# Patient Record
Sex: Female | Born: 1940 | Race: White | Hispanic: No | Marital: Married | Smoking: Never smoker
Health system: Southern US, Community
[De-identification: ages and names within clinical notes are randomized; demographics above are authoritative.]

## PROBLEM LIST (undated history)

## (undated) DIAGNOSIS — Z9289 Personal history of other medical treatment: Secondary | ICD-10-CM

## (undated) DIAGNOSIS — K219 Gastro-esophageal reflux disease without esophagitis: Secondary | ICD-10-CM

## (undated) DIAGNOSIS — M1990 Unspecified osteoarthritis, unspecified site: Secondary | ICD-10-CM

## (undated) DIAGNOSIS — I10 Essential (primary) hypertension: Secondary | ICD-10-CM

## (undated) DIAGNOSIS — I779 Disorder of arteries and arterioles, unspecified: Secondary | ICD-10-CM

## (undated) DIAGNOSIS — F419 Anxiety disorder, unspecified: Secondary | ICD-10-CM

## (undated) DIAGNOSIS — B009 Herpesviral infection, unspecified: Secondary | ICD-10-CM

## (undated) DIAGNOSIS — E039 Hypothyroidism, unspecified: Secondary | ICD-10-CM

## (undated) DIAGNOSIS — D649 Anemia, unspecified: Secondary | ICD-10-CM

## (undated) DIAGNOSIS — E785 Hyperlipidemia, unspecified: Secondary | ICD-10-CM

## (undated) DIAGNOSIS — R7303 Prediabetes: Secondary | ICD-10-CM

## (undated) DIAGNOSIS — I739 Peripheral vascular disease, unspecified: Secondary | ICD-10-CM

## (undated) DIAGNOSIS — C4490 Unspecified malignant neoplasm of skin, unspecified: Secondary | ICD-10-CM

## (undated) DIAGNOSIS — J45909 Unspecified asthma, uncomplicated: Secondary | ICD-10-CM

## (undated) HISTORY — PX: TONSILLECTOMY: SUR1361

## (undated) HISTORY — DX: Hyperlipidemia, unspecified: E78.5

## (undated) HISTORY — DX: Unspecified osteoarthritis, unspecified site: M19.90

## (undated) HISTORY — DX: Essential (primary) hypertension: I10

## (undated) HISTORY — DX: Herpesviral infection, unspecified: B00.9

## (undated) HISTORY — DX: Anxiety disorder, unspecified: F41.9

## (undated) HISTORY — PX: JOINT REPLACEMENT: SHX530

## (undated) HISTORY — DX: Unspecified malignant neoplasm of skin, unspecified: C44.90

## (undated) HISTORY — PX: DILATION AND CURETTAGE OF UTERUS: SHX78

## (undated) HISTORY — PX: EYE SURGERY: SHX253

## (undated) HISTORY — PX: OTHER SURGICAL HISTORY: SHX169

## (undated) HISTORY — DX: Gastro-esophageal reflux disease without esophagitis: K21.9

---

## 1970-10-22 HISTORY — PX: ABDOMINAL EXPLORATION SURGERY: SHX538

## 1991-10-23 HISTORY — PX: ABDOMINAL HYSTERECTOMY: SHX81

## 2008-03-12 ENCOUNTER — Ambulatory Visit: Payer: Self-pay | Admitting: Internal Medicine

## 2009-10-22 DIAGNOSIS — F419 Anxiety disorder, unspecified: Secondary | ICD-10-CM

## 2009-10-22 HISTORY — DX: Anxiety disorder, unspecified: F41.9

## 2010-01-09 ENCOUNTER — Ambulatory Visit: Payer: Self-pay

## 2010-02-17 ENCOUNTER — Ambulatory Visit: Payer: Self-pay | Admitting: General Practice

## 2010-02-20 DIAGNOSIS — B009 Herpesviral infection, unspecified: Secondary | ICD-10-CM

## 2010-02-20 HISTORY — DX: Herpesviral infection, unspecified: B00.9

## 2010-03-03 ENCOUNTER — Ambulatory Visit: Payer: Self-pay | Admitting: General Practice

## 2010-07-05 ENCOUNTER — Ambulatory Visit: Payer: Self-pay | Admitting: Internal Medicine

## 2011-02-20 HISTORY — PX: OTHER SURGICAL HISTORY: SHX169

## 2011-03-21 LAB — HM DEXA SCAN: HM Dexa Scan: NORMAL

## 2011-06-27 ENCOUNTER — Other Ambulatory Visit: Payer: Self-pay | Admitting: Internal Medicine

## 2011-06-27 MED ORDER — ACYCLOVIR 400 MG PO TABS: 400.0000 mg | ORAL_TABLET | Freq: Every day | ORAL | Status: AC

## 2011-07-12 ENCOUNTER — Ambulatory Visit (INDEPENDENT_AMBULATORY_CARE_PROVIDER_SITE_OTHER): Payer: BC Managed Care – PPO | Admitting: Internal Medicine

## 2011-07-12 ENCOUNTER — Encounter: Payer: Self-pay | Admitting: Internal Medicine

## 2011-07-12 VITALS — BP 154/81 | HR 76 | Temp 98.2°F | Resp 16 | Ht 64.0 in | Wt 154.5 lb

## 2011-07-12 DIAGNOSIS — Z23 Encounter for immunization: Secondary | ICD-10-CM

## 2011-07-12 DIAGNOSIS — E785 Hyperlipidemia, unspecified: Secondary | ICD-10-CM

## 2011-07-12 DIAGNOSIS — E039 Hypothyroidism, unspecified: Secondary | ICD-10-CM

## 2011-07-12 DIAGNOSIS — I10 Essential (primary) hypertension: Secondary | ICD-10-CM

## 2011-07-12 DIAGNOSIS — Z0000 Encounter for general adult medical examination without abnormal findings: Secondary | ICD-10-CM

## 2011-07-12 LAB — CBC WITH DIFFERENTIAL/PLATELET
Basophils Absolute: 0 10*3/uL (ref 0.0–0.1)
Basophils Relative: 0.8 % (ref 0.0–3.0)
Eosinophils Absolute: 0.1 10*3/uL (ref 0.0–0.7)
Lymphocytes Relative: 30.9 % (ref 12.0–46.0)
MCHC: 32.3 g/dL (ref 30.0–36.0)
MCV: 90.7 fl (ref 78.0–100.0)
Monocytes Absolute: 0.3 10*3/uL (ref 0.1–1.0)
Neutrophils Relative %: 60.1 % (ref 43.0–77.0)
Platelets: 222 10*3/uL (ref 150.0–400.0)
RDW: 13.8 % (ref 11.5–14.6)

## 2011-07-12 LAB — COMPREHENSIVE METABOLIC PANEL
ALT: 46 U/L — ABNORMAL HIGH (ref 0–35)
AST: 44 U/L — ABNORMAL HIGH (ref 0–37)
Albumin: 4.8 g/dL (ref 3.5–5.2)
Alkaline Phosphatase: 83 U/L (ref 39–117)
BUN: 18 mg/dL (ref 6–23)
Potassium: 3.9 mEq/L (ref 3.5–5.1)
Sodium: 140 mEq/L (ref 135–145)

## 2011-07-12 LAB — LIPID PANEL
LDL Cholesterol: 106 mg/dL — ABNORMAL HIGH (ref 0–99)
Total CHOL/HDL Ratio: 3
Triglycerides: 127 mg/dL (ref 0.0–149.0)

## 2011-07-12 LAB — MICROALBUMIN / CREATININE URINE RATIO: Creatinine,U: 43.5 mg/dL

## 2011-07-12 LAB — HM MAMMOGRAPHY: HM Mammogram: NORMAL

## 2011-07-12 LAB — TSH: TSH: 0.68 u[IU]/mL (ref 0.35–5.50)

## 2011-07-12 NOTE — Progress Notes (Signed)
Subjective:    Mary Pacheco is a 70 y.o. female who presents for Medicare Annual/Subsequent preventive examination.  Preventive Screening-Counseling & Management  Tobacco History  Smoking status  . Never Smoker   Smokeless tobacco  . Never Used     Problems Prior to Visit 1. Hyperlipidemia 2. Hypothyroidism 3. Anxiety  4. Hypertension   Current Problems (verified) Patient Active Problem List  Diagnoses  . Hyperlipidemia    Medications Prior to Visit No current outpatient prescriptions on file prior to visit.    Current Medications (verified) Current Outpatient Prescriptions  Medication Sig Dispense Refill  . acyclovir (ZOVIRAX) 400 MG tablet Take 400 mg by mouth daily.       [name] ALPRAZolam (XANAX) 0.25 MG tablet Take 0.25 mg by mouth at bedtime as needed.        [name] aspirin 81 MG tablet Take 81 mg by mouth daily.        . cholecalciferol (VITAMIN D) 1000 UNITS tablet Take 1,000 Units by mouth daily.        . hydrochlorothiazide (MICROZIDE) 12.5 MG capsule Take 12.5 mg by mouth daily.       . Misc Natural Products (TOTAL CARDIO HEALTH FORMULA PO) Take 1 tablet by mouth daily.        . montelukast (SINGULAIR) 10 MG tablet Take 10 mg by mouth at bedtime.        . Multiple Vitamin (MULTIVITAMIN) capsule Take 1 capsule by mouth daily.        [name] NEXIUM 40 MG capsule Take 40 mg by mouth daily.       . simvastatin (ZOCOR) 20 MG tablet Take 20 mg by mouth daily.       [name] SYNTHROID 50 MCG tablet Take 50 mcg by mouth daily.       [name] terbinafine (LAMISIL) 250 MG tablet Take 250 mg by mouth daily.        [name] triamcinolone (KENALOG) 0.1 % cream Apply 1 application topically as needed.           Allergies (verified) Molds & smuts   PAST HISTORY  Family History No family history on file.  Social History History  Substance Use Topics  . Smoking status: Never Smoker   . Smokeless tobacco: Never Used  . Alcohol Use: No     socially     Are there smokers in your home (other  than you)? No  Risk Factors Current exercise habits: Gym/ health club routine includes cardio.  Dietary issues discussed: low fat, high fiber.   Cardiac risk factors: family history of premature cardiovascular disease and hypertension.  Depression Screen (Note: if answer to either of the following is "Yes", a more complete depression screening is indicated)   Over the past two weeks, have you felt down, depressed or hopeless? No  Over the past two weeks, have you felt little interest or pleasure in doing things? No  Have you lost interest or pleasure in daily life? No  Do you often feel hopeless? No  Do you cry easily over simple problems? No  Activities of Daily Living In your present state of health, do you have any difficulty performing the following activities?:  Driving? No Managing money?  No Feeding yourself? No Getting from bed to chair? No Climbing a flight of stairs? No Preparing food and eating?: No Bathing or showering? No Getting dressed: No Getting to the toilet? No Using the toilet:No Moving around from place to place: No In the past  year have you fallen or had a near fall?:No   Are you sexually active?  Yes  Do you have more than one partner?  No  Hearing Difficulties: Yes Do you often ask people to speak up or repeat themselves? No Do you experience ringing or noises in your ears? No Do you have difficulty understanding soft or whispered voices? Yes   Do you feel that you have a problem with memory? No  Do you often misplace items? Yes  Do you feel safe at home?  Yes  Cognitive Testing  Alert? Yes  Normal Appearance?Yes  Oriented to person? Yes  Place? Yes   Time? Yes  Recall of three objects?  Yes  Can perform simple calculations? Yes  Displays appropriate judgment?Yes  Can read the correct time from a watch face?Yes   Advanced Directives have been discussed with the patient? Yes, HCPOA husband  List the Names of Other Physician/Practitioners  you currently use: 1.  Dr. Ernest Pine  Indicate any recent Medical Services you may have received from other than [facility] providers in the past year (date may be approximate).   There is no immunization history on file for this patient.  Screening Tests Health Maintenance  Topic Date Due  . Tetanus/tdap  07/28/1959  . Colonoscopy  07/27/1990  . Zostavax  07/27/2000  . Pneumococcal Polysaccharide Vaccine Age 11 And Over  07/27/2005  . Influenza Vaccine  07/23/2011    All answers were reviewed with the patient and necessary referrals were made:  Shelia Media, MD   07/12/2011   History reviewed: allergies, current medications, past family history, past medical history, past social history, past surgical history and problem list  Review of Systems A comprehensive review of systems was negative.    Objective:       Body mass index is 26.52 kg/(m^2). BP 154/81  Pulse 76  Temp(Src) 98.2 F (36.8 C) (Oral)  Resp 16  Ht 5\' 4"  (1.626 m)  Wt 154 lb 8 oz (70.081 kg)  BMI 26.52 kg/m2  SpO2 98%  BP 154/81  Pulse 76  Temp(Src) 98.2 F (36.8 C) (Oral)  Resp 16  Ht 5\' 4"  (1.626 m)  Wt 154 lb 8 oz (70.081 kg)  BMI 26.52 kg/m2  SpO2 98%  General Appearance:    Alert, cooperative, no distress, appears stated age  Head:    Normocephalic, without obvious abnormality, atraumatic  Eyes:    PERRL, conjunctiva/corneas clear, EOM's intact, fundi    benign, both eyes  Ears:    Normal TM's and external ear canals, both ears  Nose:   Nares normal, septum midline, mucosa normal, no drainage    or sinus tenderness  Throat:   Lips, mucosa, and tongue normal; teeth and gums normal  Neck:   Supple, symmetrical, trachea midline, no adenopathy;    thyroid:  no enlargement/tenderness/nodules; no carotid   bruit or JVD  Back:     Symmetric, no curvature, ROM normal, no CVA tenderness  Lungs:     Clear to auscultation bilaterally, respirations unlabored  Chest Wall:    No tenderness or deformity    Heart:    Regular rate and rhythm, S1 and S2 normal, no murmur, rub   or gallop  Breast Exam:    No tenderness, masses, or nipple abnormality  Abdomen:     Soft, non-tender, bowel sounds active all four quadrants,    no masses, no organomegaly  Genitalia:    Normal female without lesion, discharge or tenderness, uterus  surgically absent.  Rectal:    Normal tone, no masses or tenderness;   guaiac negative stool  Extremities:   Extremities normal, atraumatic, no cyanosis or edema  Pulses:   2+ and symmetric all extremities  Skin:   Skin color, texture, turgor normal, no rashes or lesions  Lymph nodes:   Cervical, supraclavicular, and axillary nodes normal  Neurologic:   CNII-XII intact, normal strength, sensation and reflexes    throughout       Assessment:   1. General exam - patient presents for annual exam. She is doing very well. She eats a healthy well rounded diet and is exercising at a local gym on a regular basis. She had general physical exam with Pap smear performed today. Mammogram is scheduled for this afternoon. Colonoscopy is up to date. We will ask for records on bone density scan. Immunizations are up to date with the exception of influenza which was given today. Basic lab work was performed today including CBC, CMP, lipid profile, TSH, urine microalbumin.  2. HTN - blood pressure has been well controlled. Renal function was checked with labs today. Patient will continue current medications.  3. Hypothyroidism - will check TSH with labs today. Will continue Synthroid.  4. Anxiety - well-controlled on current medications. Patient will followup in 6 months.     Plan:     During the course of the visit the patient was educated and counseled about appropriate screening and preventive services including:    Pneumococcal vaccine   Influenza vaccine  Screening mammography  Screening Pap smear and pelvic exam   Bone densitometry screening  Colorectal cancer  screening  Diabetes screening  Advanced directives: power of attorney for healthcare on file, has an advanced directive - a copy HAS NOT been provided.  Diet review for nutrition referral? Not Indicated   Patient Instructions (the written plan) was given to the patient.  Medicare Attestation I have personally reviewed: The patient's medical and social history Their use of alcohol, tobacco or illicit drugs Their current medications and supplements The patient's functional ability including ADLs,fall risks, home safety risks, cognitive, and hearing and visual impairment Diet and physical activities Evidence for depression or mood disorders  The patient's weight, height, BMI, and visual acuity have been recorded in the chart.  I have made referrals, counseling, and provided education to the patient based on review of the above and I have provided the patient with a written personalized care plan for preventive services.     Shelia Media, MD   07/12/2011

## 2011-07-13 ENCOUNTER — Other Ambulatory Visit: Payer: Self-pay | Admitting: Internal Medicine

## 2011-07-13 ENCOUNTER — Other Ambulatory Visit (HOSPITAL_COMMUNITY)
Admission: RE | Admit: 2011-07-13 | Discharge: 2011-07-13 | Disposition: A | Discharge status: Home / Self Care | Payer: MEDICARE | Source: Ambulatory Visit | Attending: Internal Medicine | Admitting: Internal Medicine

## 2011-07-13 DIAGNOSIS — Z01419 Encounter for gynecological examination (general) (routine) without abnormal findings: Secondary | ICD-10-CM | POA: Insufficient documentation

## 2011-07-13 DIAGNOSIS — Z0000 Encounter for general adult medical examination without abnormal findings: Secondary | ICD-10-CM

## 2011-07-13 DIAGNOSIS — Z1159 Encounter for screening for other viral diseases: Secondary | ICD-10-CM | POA: Insufficient documentation

## 2011-07-16 ENCOUNTER — Telehealth: Payer: Self-pay | Admitting: *Deleted

## 2011-07-16 DIAGNOSIS — R7989 Other specified abnormal findings of blood chemistry: Secondary | ICD-10-CM

## 2011-07-16 NOTE — Telephone Encounter (Signed)
Message copied by Jobie Quaker on Mon Jul 16, 2011  2:55 PM ------      Message from: Ronna Polio A      Created: Fri Jul 13, 2011  1:48 PM      Regarding: mammo       Mammogram was normal.

## 2011-07-16 NOTE — Telephone Encounter (Signed)
Labs looked fine except that liver function tests were just on the upper limit of normal. I would recommend repeating LFTs in 1 month.  We can mail a copy of her labs. I don't know why they weren't routed to me for review?

## 2011-07-16 NOTE — Telephone Encounter (Signed)
Patient notified. Also she is asking for results on her labs. It doesn't look like they were routed to you to reviewe them. Please advise.

## 2011-07-17 NOTE — Telephone Encounter (Signed)
Patient notified. Lab appt scheduled. Future order placed for LFT's.

## 2011-07-18 ENCOUNTER — Encounter: Payer: Self-pay | Admitting: Internal Medicine

## 2011-07-27 ENCOUNTER — Other Ambulatory Visit: Payer: Self-pay | Admitting: Internal Medicine

## 2011-07-29 MED ORDER — ESOMEPRAZOLE MAGNESIUM 40 MG PO CPDR: 40.0000 mg | DELAYED_RELEASE_CAPSULE | Freq: Every day | ORAL | Status: DC

## 2011-08-01 ENCOUNTER — Other Ambulatory Visit: Payer: Self-pay | Admitting: Internal Medicine

## 2011-08-06 NOTE — Telephone Encounter (Signed)
Opened in error

## 2011-08-13 ENCOUNTER — Other Ambulatory Visit (INDEPENDENT_AMBULATORY_CARE_PROVIDER_SITE_OTHER): Payer: BC Managed Care – PPO | Admitting: *Deleted

## 2011-08-13 ENCOUNTER — Encounter: Payer: Self-pay | Admitting: Internal Medicine

## 2011-08-13 DIAGNOSIS — R7989 Other specified abnormal findings of blood chemistry: Secondary | ICD-10-CM

## 2011-08-14 LAB — HEPATIC FUNCTION PANEL
ALT: 33 U/L (ref 0–35)
AST: 31 U/L (ref 0–37)
Albumin: 4.5 g/dL (ref 3.5–5.2)
Alkaline Phosphatase: 87 U/L (ref 39–117)

## 2011-08-30 ENCOUNTER — Encounter: Payer: Self-pay | Admitting: Internal Medicine

## 2011-12-28 ENCOUNTER — Emergency Department: Payer: Self-pay | Admitting: Emergency Medicine

## 2011-12-28 ENCOUNTER — Telehealth: Payer: Self-pay | Admitting: Internal Medicine

## 2011-12-28 LAB — COMPREHENSIVE METABOLIC PANEL
Albumin: 4.5 g/dL (ref 3.4–5.0)
Anion Gap: 9 (ref 7–16)
BUN: 13 mg/dL (ref 7–18)
Bilirubin,Total: 0.5 mg/dL (ref 0.2–1.0)
Chloride: 104 mmol/L (ref 98–107)
Creatinine: 0.84 mg/dL (ref 0.60–1.30)
EGFR (African American): >60
EGFR (Non-African Amer.): >60
Glucose: 98 mg/dL (ref 65–99)
Potassium: 3.2 mmol/L — ABNORMAL LOW (ref 3.5–5.1)
SGPT (ALT): 80 U/L — ABNORMAL HIGH
Total Protein: 7.9 g/dL (ref 6.4–8.2)

## 2011-12-28 LAB — CBC
HCT: 41.9 % (ref 35.0–47.0)
MCH: 29.6 pg (ref 26.0–34.0)
MCV: 89 fL (ref 80–100)
RBC: 4.72 10*6/uL (ref 3.80–5.20)
RDW: 13.3 % (ref 11.5–14.5)

## 2011-12-28 LAB — LIPASE, BLOOD: Lipase: 250 U/L (ref 73–393)

## 2011-12-28 NOTE — Telephone Encounter (Signed)
Patient called in and said that she has had diarrhea for a couple of days and this morning when she went it was black, it was like pureed black beans, she hasn't eaten much since Monday.  Patient would like to be seen today Please advise.

## 2011-12-28 NOTE — Telephone Encounter (Signed)
Not seeing this until now, Sorry, I left pt detailed VM to call office ASAP and speak with triage nurses that are avail 24/7

## 2011-12-31 NOTE — Telephone Encounter (Signed)
Can we please request notes from the ED? I want to be sure they checked blood counts and stool for blood.

## 2011-12-31 NOTE — Telephone Encounter (Signed)
[facility] Triage Call Report Triage Record Num: [id number] Operator: Amy Head Patient Name: Mary Pacheco Call Date & Time: 12/28/2011 4:58:50PM Patient Phone: [phone] PCP: Ronna Polio Patient Gender: Female PCP Fax : [phone] Patient DOB: 04/06/1941 Practice Name: [facility] Reason for Call: Caller: Alcie/Patient; PCP: Ronna Polio; CB#: [phone]; ; ; Call regarding Diarrhea. Onset 2 days ago. This morning, noticed that her stool is "black as coal and is water." Afebrile. Takes Nexium. Has had black watery stools X 4 today. Feels like heart is beating fast. Has BP cuff. Advised to take BP: 172/100, HR 78. Weak. When asked if there was any chest pain or discomfort, pt states "I just feel uncomfortable." Advised 911 per guidelines. THE PATIENT REFUSED 911; Protocol(s) Used: Diarrhea or Other Change in Bowel Habits Protocol(s) Used: Gastrointestinal Bleeding Recommended Outcome per Protocol: Activate EMS 911 Reason for Outcome: Rectal bleeding (blood in or on the stool, more than scant; black tarry stools) Pressure, fullness, squeezing sensation or pain anywhere in the chest lasting 5 or more minutes now or within the last hour. Pain is NOT associated with taking a deep breath or a productive cough, movement, or touch to a localized area on the chest. Care Advice: ~ An adult should stay with the patient, preferably one trained in CPR. ~ IMMEDIATE ACTION Write down provider's name. List or place the following in a bag for transport with the patient: current prescription and/or nonprescription medications; alternative treatments, therapies and medications; and street drugs. ~ ~ Place person in a position of comfort and loosen tight clothing. After calling EMS 911, have the person chew one aspirin tablet (325 mg), or 4 baby aspirin (81mg ) with a small amount of water now if conscious, not allergic to aspirin, or if has not been told to avoid  taking aspirin by their provider. It is important to use aspirin, not acetaminophen. ~ Take nitroglycerin as directed if prescribed by your provider. Men should not take nitroglycerin within 36 hours of taking erectile dysfunction drugs unless directed to do so by their provider as it may cause a sudden drop in blood pressure. ~ ~ Tell providers if you are taking an erectile dysfunction drug such as Viagra(R), Cialis(R), Levitra(R). 12/28/2011 5:11:36PM Page 1 of 1 [id number]

## 2011-12-31 NOTE — Telephone Encounter (Signed)
Patient did go to the ER her potassium was a little low and was not dehydrated.  They did take 4 tubes of blood.  She states that she feels bloated and she isn't eating a lot of solid food she eats a lot of fruit, but what she would consider a solid standard meal she isn't up to that.  She states that she had taken one dose of pepto bismol and that can change your poop black per ER.  She does have an appointment with Dr. Dan Humphreys on the 20th.

## 2011-12-31 NOTE — Telephone Encounter (Signed)
Was patient seen?  Can you please call to make sure that pt was seen? Description sounds like upper GI bleeding, which needs emergent eval.

## 2012-01-03 NOTE — Telephone Encounter (Signed)
I have faxed Leonette Most at [facility] medical records to get the ED report.

## 2012-01-09 ENCOUNTER — Ambulatory Visit (INDEPENDENT_AMBULATORY_CARE_PROVIDER_SITE_OTHER): Payer: Self-pay | Admitting: Internal Medicine

## 2012-01-09 ENCOUNTER — Encounter: Payer: Self-pay | Admitting: Internal Medicine

## 2012-01-09 VITALS — BP 142/70 | HR 75 | Temp 97.8°F | Ht 64.0 in | Wt 153.0 lb

## 2012-01-09 DIAGNOSIS — M810 Age-related osteoporosis without current pathological fracture: Secondary | ICD-10-CM

## 2012-01-09 DIAGNOSIS — K219 Gastro-esophageal reflux disease without esophagitis: Secondary | ICD-10-CM | POA: Insufficient documentation

## 2012-01-09 DIAGNOSIS — E785 Hyperlipidemia, unspecified: Secondary | ICD-10-CM

## 2012-01-09 DIAGNOSIS — M129 Arthropathy, unspecified: Secondary | ICD-10-CM

## 2012-01-09 DIAGNOSIS — M1990 Unspecified osteoarthritis, unspecified site: Secondary | ICD-10-CM

## 2012-01-09 DIAGNOSIS — M8580 Other specified disorders of bone density and structure, unspecified site: Secondary | ICD-10-CM | POA: Insufficient documentation

## 2012-01-09 DIAGNOSIS — E039 Hypothyroidism, unspecified: Secondary | ICD-10-CM

## 2012-01-09 DIAGNOSIS — A6000 Herpesviral infection of urogenital system, unspecified: Secondary | ICD-10-CM | POA: Insufficient documentation

## 2012-01-09 DIAGNOSIS — I739 Peripheral vascular disease, unspecified: Secondary | ICD-10-CM | POA: Insufficient documentation

## 2012-01-09 DIAGNOSIS — Z889 Allergy status to unspecified drugs, medicaments and biological substances status: Secondary | ICD-10-CM

## 2012-01-09 DIAGNOSIS — I10 Essential (primary) hypertension: Secondary | ICD-10-CM

## 2012-01-09 DIAGNOSIS — T7840XA Allergy, unspecified, initial encounter: Secondary | ICD-10-CM

## 2012-01-09 LAB — COMPREHENSIVE METABOLIC PANEL
Albumin: 4.4 g/dL (ref 3.5–5.2)
Alkaline Phosphatase: 77 U/L (ref 39–117)
BUN: 16 mg/dL (ref 6–23)
Calcium: 9.8 mg/dL (ref 8.4–10.5)
Chloride: 100 mEq/L (ref 96–112)
GFR: 67.24 mL/min (ref >60.00)
Glucose, Bld: 102 mg/dL — ABNORMAL HIGH (ref 70–99)
Potassium: 4.7 mEq/L (ref 3.5–5.1)

## 2012-01-09 LAB — LIPID PANEL
Cholesterol: 170 mg/dL (ref 0–200)
Triglycerides: 98 mg/dL (ref 0.0–149.0)

## 2012-01-09 MED ORDER — SIMVASTATIN 20 MG PO TABS: 20.0000 mg | ORAL_TABLET | Freq: Every day | ORAL | Status: DC

## 2012-01-09 MED ORDER — LEVOTHYROXINE SODIUM 50 MCG PO TABS: 50.0000 ug | ORAL_TABLET | Freq: Every day | ORAL | Status: DC

## 2012-01-09 MED ORDER — MONTELUKAST SODIUM 10 MG PO TABS: 10.0000 mg | ORAL_TABLET | Freq: Every day | ORAL | Status: DC

## 2012-01-09 MED ORDER — ESOMEPRAZOLE MAGNESIUM 40 MG PO CPDR: 40.0000 mg | DELAYED_RELEASE_CAPSULE | Freq: Every day | ORAL | Status: DC

## 2012-01-09 MED ORDER — TRAMADOL HCL 50 MG PO TABS: 50.0000 mg | ORAL_TABLET | Freq: Three times a day (TID) | ORAL | Status: AC | PRN

## 2012-01-09 MED ORDER — ACYCLOVIR 400 MG PO TABS: 400.0000 mg | ORAL_TABLET | Freq: Every day | ORAL | Status: DC

## 2012-01-09 MED ORDER — HYDROCHLOROTHIAZIDE 12.5 MG PO CAPS: 12.5000 mg | ORAL_CAPSULE | Freq: Every day | ORAL | Status: DC

## 2012-01-09 NOTE — Assessment & Plan Note (Signed)
Will check lipids and LFTs with labs today. Follow up 1 month.

## 2012-01-09 NOTE — Assessment & Plan Note (Signed)
Symptomatically well controlled. Will continue Synthroid.

## 2012-01-09 NOTE — Assessment & Plan Note (Signed)
Symptoms worsened with trying to decrease Nexium to every other day. Will give a trial of Dexilant.  She will call or e-mail with update.

## 2012-01-09 NOTE — Progress Notes (Signed)
Subjective:    Patient ID: Mary Pacheco, female    DOB: [birth year], 71 y.o.   MRN: [id number]  HPI 71 year old female with history of hyperlipidemia, hypertension, and GERD presents for followup. She reports that she is generally doing well. She is concerned today about some recent cramping in her left leg. She reports that the cramping occurs and extends from her left upper thigh to her left calf. This occurs only when standing for prolonged periods or walking. It resolves with sitting to rest. She denies any swelling in her leg or discoloration of the leg. She denies any sense that her leg might give out on her. 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 Next  She is also concerned about her GERD. She has tried limiting her Nexium to every other day but had recurrence of reflux symptoms. She is concerned about taking Nexium on a daily basis because of the potential for limiting the absorption of calcium.  She also notes recent worsening of her arthritis, particularly in her lower back. She had been soaking raisins and gin over the last several years and using this as a home remedy which helped with her arthritis. She has avoided nonsteroidals because of her elevated blood pressure and GERD. She notes over the last few months some increase in the chronic aching in her lower back. However, she reports that pain is not present today.  Outpatient Encounter Prescriptions as of 01/09/2012  Medication Sig Dispense Refill  . acyclovir (ZOVIRAX) 400 MG tablet Take 1 tablet (400 mg total) by mouth daily.  90 tablet  3  . ALPRAZolam (XANAX) 0.25 MG tablet Take 0.25 mg by mouth at bedtime as needed.        [name] aspirin 81 MG tablet Take 81 mg by mouth daily.        . cholecalciferol (VITAMIN D) 1000 UNITS tablet Take 1,000 Units by mouth daily.        [name] esomeprazole (NEXIUM) 40 MG capsule Take 1 capsule (40 mg total) by mouth daily.  90 capsule  3    . hydrochlorothiazide (MICROZIDE) 12.5 MG capsule Take 1 capsule (12.5 mg total) by mouth daily.  90 capsule  3  . levothyroxine (SYNTHROID) 50 MCG tablet Take 1 tablet (50 mcg total) by mouth daily.  90 tablet  2  . Misc Natural Products (TOTAL CARDIO HEALTH FORMULA PO) Take 1 tablet by mouth daily.        . montelukast (SINGULAIR) 10 MG tablet Take 1 tablet (10 mg total) by mouth at bedtime.  90 tablet  2  . simvastatin (ZOCOR) 20 MG tablet Take 1 tablet (20 mg total) by mouth daily.  90 tablet  2  . triamcinolone (KENALOG) 0.1 % cream Apply 1 application topically as needed.        . Multiple Vitamin (MULTIVITAMIN) capsule Take 1 capsule by mouth daily.        . traMADol (ULTRAM) 50 MG tablet Take 1 tablet (50 mg total) by mouth every 8 (eight) hours as needed for pain.  30 tablet  0    Review of Systems  Constitutional: Negative for fever, chills, appetite change, fatigue and unexpected weight change.  HENT: Negative for ear pain, congestion, sore throat, trouble swallowing, neck pain, voice change and sinus pressure.   Eyes: Negative for visual disturbance.  Respiratory: Negative for cough, shortness of breath, wheezing and stridor.   Cardiovascular: Negative for  chest pain, palpitations and leg swelling.  Gastrointestinal: Negative for nausea, vomiting, abdominal pain, diarrhea, constipation, blood in stool, abdominal distention and anal bleeding.  Genitourinary: Negative for dysuria and flank pain.  Musculoskeletal: Positive for myalgias, back pain and arthralgias. Negative for gait problem.  Skin: Negative for color change and rash.  Neurological: Negative for dizziness and headaches.  Hematological: Negative for adenopathy. Does not bruise/bleed easily.  Psychiatric/Behavioral: Negative for suicidal ideas, sleep disturbance and dysphoric mood. The patient is not nervous/anxious.    BP 142/70  Pulse 75  Temp(Src) 97.8 F (36.6 C) (Oral)  Ht 5\' 4"  (1.626 m)  Wt 153 lb (69.4 kg)   BMI 26.26 kg/m2  SpO2 98%     Objective:   Physical Exam  Constitutional: She is oriented to person, place, and time. She appears well-developed and well-nourished. No distress.  HENT:  Head: Normocephalic and atraumatic.  Right Ear: External ear normal.  Left Ear: External ear normal.  Nose: Nose normal.  Mouth/Throat: Oropharynx is clear and moist. No oropharyngeal exudate.  Eyes: Conjunctivae are normal. Pupils are equal, round, and reactive to light. Right eye exhibits no discharge. Left eye exhibits no discharge. No scleral icterus.  Neck: Normal range of motion. Neck supple. No tracheal deviation present. No thyromegaly present.  Cardiovascular: Normal rate, regular rhythm, normal heart sounds and intact distal pulses.  Exam reveals no gallop and no friction rub.   No murmur heard. Pulmonary/Chest: Effort normal and breath sounds normal. No respiratory distress. She has no wheezes. She has no rales. She exhibits no tenderness.  Musculoskeletal: Normal range of motion. She exhibits no edema and no tenderness.  Lymphadenopathy:    She has no cervical adenopathy.  Neurological: She is alert and oriented to person, place, and time. No cranial nerve deficit. She exhibits normal muscle tone. Coordination normal.  Skin: Skin is warm and dry. No rash noted. She is not diaphoretic. No erythema. No pallor.  Psychiatric: She has a normal mood and affect. Her behavior is normal. Judgment and thought content normal.          Assessment & Plan:

## 2012-01-09 NOTE — Assessment & Plan Note (Signed)
Recent worsening in chronic arthritis pain in the low back and knees. Encouraged her to continue to use Tylenol. Will add tramadol as needed for episodes of severe pain. If no improvement, she will contact our office.

## 2012-01-09 NOTE — Assessment & Plan Note (Signed)
BP slightly above goal today. Will check renal function with labs. Follow 1 month.

## 2012-01-09 NOTE — Assessment & Plan Note (Signed)
Patient is over due for DEXA scan. We'll schedule today.

## 2012-01-09 NOTE — Assessment & Plan Note (Signed)
Symptoms of left leg cramping are concerning for claudication. Will set up arterial Dopplers of her LLE with vascular surgery.

## 2012-01-14 ENCOUNTER — Telehealth: Payer: Self-pay | Admitting: *Deleted

## 2012-01-14 NOTE — Telephone Encounter (Signed)
We can send her today for lower extremity doppler at the hospital to look for DVT if pain/swelling worsening.

## 2012-01-14 NOTE — Telephone Encounter (Signed)
Pt left VM - She has apt w/vascular MD 4/22 and wanted earlier apt or advice. C/o pain unrelieved by pain med unless she is lying flat and pain med only lasts a short time. Please advise.

## 2012-01-14 NOTE — Telephone Encounter (Signed)
Tried calling patient x 2, but line was busy.

## 2012-01-24 NOTE — Telephone Encounter (Signed)
No answer when tried to call pt around 11 today

## 2012-01-24 NOTE — Telephone Encounter (Signed)
Mychart mess sent

## 2012-02-20 ENCOUNTER — Telehealth: Payer: Self-pay | Admitting: Internal Medicine

## 2012-02-20 NOTE — Telephone Encounter (Signed)
If symptoms worsen or are severe, we can see her in urgent visit for eval.

## 2012-02-20 NOTE — Telephone Encounter (Signed)
Patient informed; will keep [place] updated w/call tomorrow; no swelling, SOB, chest pain/SLS

## 2012-02-20 NOTE — Telephone Encounter (Signed)
Caller: Mary Pacheco/Patient; PCP: Ronna Polio; CB#: [phone];  Call regarding Rash;  Concerned about [place]ison [place]  with blisters on index finger and lower cheek and rash on R wrist and  L arm. Onset: 02/17/12. Afebrile.  Home care advice given for itching, burning skin and blisters per Poison [place], [place] or [address] Exposure Guideline.

## 2012-02-26 ENCOUNTER — Telehealth: Payer: Self-pay | Admitting: Internal Medicine

## 2012-02-26 NOTE — Telephone Encounter (Signed)
T-score -1.24--0.83

## 2012-03-10 ENCOUNTER — Ambulatory Visit (INDEPENDENT_AMBULATORY_CARE_PROVIDER_SITE_OTHER): Payer: BC Managed Care – PPO | Admitting: Internal Medicine

## 2012-03-10 ENCOUNTER — Encounter: Payer: Self-pay | Admitting: Internal Medicine

## 2012-03-10 ENCOUNTER — Telehealth: Payer: Self-pay | Admitting: Internal Medicine

## 2012-03-10 VITALS — BP 122/79 | HR 73 | Temp 98.1°F | Resp 16 | Wt 153.0 lb

## 2012-03-10 DIAGNOSIS — F411 Generalized anxiety disorder: Secondary | ICD-10-CM

## 2012-03-10 DIAGNOSIS — F419 Anxiety disorder, unspecified: Secondary | ICD-10-CM

## 2012-03-10 DIAGNOSIS — I739 Peripheral vascular disease, unspecified: Secondary | ICD-10-CM

## 2012-03-10 DIAGNOSIS — M810 Age-related osteoporosis without current pathological fracture: Secondary | ICD-10-CM

## 2012-03-10 MED ORDER — ALPRAZOLAM 0.25 MG PO TABS: 0.2500 mg | ORAL_TABLET | Freq: Three times a day (TID) | ORAL | Status: DC | PRN

## 2012-03-10 NOTE — Telephone Encounter (Signed)
Patient sending information for a referral to Dr. Merri Ray .

## 2012-03-10 NOTE — Progress Notes (Signed)
Subjective:    Patient ID: Mary Pacheco, female    DOB: 1941-02-10, 71 y.o.   MRN: [id number]  HPI 71 year old female with history of left thigh and calf pain with exertion presents for followup. She reports that recent evaluation by vascular surgery was normal. She continues to have left thigh and calf pain which limit her ability to walk. The pain does not occur at rest. It is alleviated by rest. She does not have swelling or change in sensation in her left leg.  She also presents to followup after recent bone density testing. Bone density test showed T score of -1.2. This is consistent with osteopenia. She reports that she has been diagnosed with osteopenia in the past. She currently takes vitamin D 1000 units daily. She will longer takes calcium supplements. However, she does follow a healthy diet including calcium rich foods. She does not exercise regularly.  Outpatient Encounter Prescriptions as of 03/10/2012  Medication Sig Dispense Refill  . acyclovir (ZOVIRAX) 400 MG tablet Take 1 tablet (400 mg total) by mouth daily.  90 tablet  3  . ALPRAZolam (XANAX) 0.25 MG tablet Take 1 tablet (0.25 mg total) by mouth 3 (three) times daily as needed.  90 tablet  3  . aspirin 81 MG tablet Take 81 mg by mouth daily.        . cholecalciferol (VITAMIN D) 1000 UNITS tablet Take 1,000 Units by mouth daily.        . diphenhydrAMINE (BENADRYL) 25 MG tablet Take 25 mg by mouth as needed. Itching.      . esomeprazole (NEXIUM) 40 MG capsule Take 1 capsule (40 mg total) by mouth daily.  90 capsule  3  . hydrochlorothiazide (MICROZIDE) 12.5 MG capsule Take 1 capsule (12.5 mg total) by mouth daily.  90 capsule  3  . levothyroxine (SYNTHROID) 50 MCG tablet Take 1 tablet (50 mcg total) by mouth daily.  90 tablet  2  . Misc Natural Products (TOTAL CARDIO HEALTH FORMULA PO) Take 1 tablet by mouth daily.        . montelukast (SINGULAIR) 10 MG tablet Take 1 tablet (10 mg total) by mouth at bedtime.  90 tablet  2  .  Multiple Vitamin (MULTIVITAMIN) capsule Take 1 capsule by mouth daily.        . simvastatin (ZOCOR) 20 MG tablet Take 1 tablet (20 mg total) by mouth daily.  90 tablet  2  . traMADol (ULTRAM) 50 MG tablet Take 50 mg by mouth every 8 (eight) hours as needed. Pain.      . triamcinolone (KENALOG) 0.1 % cream Apply 1 application topically as needed.        [name] DISCONTD: ALPRAZolam (XANAX) 0.25 MG tablet Take 0.25 mg by mouth at bedtime as needed.          Review of Systems  Constitutional: Negative for fever, chills, appetite change, fatigue and unexpected weight change.  HENT: Negative for ear pain, congestion, sore throat, trouble swallowing, neck pain, voice change and sinus pressure.   Eyes: Negative for visual disturbance.  Respiratory: Negative for cough, shortness of breath, wheezing and stridor.   Cardiovascular: Negative for chest pain, palpitations and leg swelling.  Gastrointestinal: Negative for nausea, vomiting, abdominal pain, diarrhea, constipation, blood in stool, abdominal distention and anal bleeding.  Genitourinary: Negative for dysuria and flank pain.  Musculoskeletal: Positive for myalgias and gait problem. Negative for arthralgias.  Skin: Negative for color change and rash.  Neurological: Negative for dizziness and headaches.  Hematological: Negative for adenopathy. Does not bruise/bleed easily.  Psychiatric/Behavioral: Negative for suicidal ideas, sleep disturbance and dysphoric mood. The patient is not nervous/anxious.    BP 122/79  Pulse 73  Temp(Src) 98.1 F (36.7 C) (Oral)  Resp 16  Wt 153 lb (69.4 kg)  SpO2 99%     Objective:   Physical Exam  Constitutional: She is oriented to person, place, and time. She appears well-developed and well-nourished. No distress.  HENT:  Head: Normocephalic and atraumatic.  Right Ear: External ear normal.  Left Ear: External ear normal.  Nose: Nose normal.  Mouth/Throat: Oropharynx is clear and moist. No oropharyngeal exudate.   Eyes: Conjunctivae are normal. Pupils are equal, round, and reactive to light. Right eye exhibits no discharge. Left eye exhibits no discharge. No scleral icterus.  Neck: Normal range of motion. Neck supple. No tracheal deviation present. No thyromegaly present.  Cardiovascular: Normal rate, regular rhythm, normal heart sounds and intact distal pulses.  Exam reveals no gallop and no friction rub.   No murmur heard. Pulmonary/Chest: Effort normal and breath sounds normal. No respiratory distress. She has no wheezes. She has no rales. She exhibits no tenderness.  Abdominal: Soft. Bowel sounds are normal. She exhibits no distension and no mass. There is no tenderness. There is no guarding.  Musculoskeletal: Normal range of motion. She exhibits no edema and no tenderness.  Lymphadenopathy:    She has no cervical adenopathy.  Neurological: She is alert and oriented to person, place, and time. No cranial nerve deficit. She exhibits normal muscle tone. Coordination normal.  Skin: Skin is warm and dry. No rash noted. She is not diaphoretic. No erythema. No pallor.  Psychiatric: She has a normal mood and affect. Her behavior is normal. Judgment and thought content normal.          Assessment & Plan:

## 2012-03-10 NOTE — Assessment & Plan Note (Signed)
Symptoms and exam are concerning for claudication, however patient reports arterial Dopplers were normal. Will request records from vascular surgery. Alternative considerations would include radiculopathy. We discussed potentially getting MRI of lumbar spine and neurology evaluation with possible EMG testing. She will call with neurologist to she would prefer to see.

## 2012-03-10 NOTE — Assessment & Plan Note (Signed)
Recent bone density testing showed osteopenia. Will continue vitamin D supplementation. Will continue calcium through diet. Encouraged patient to start exercise program including weightbearing activity. Will repeat bone density testing in 2015.

## 2012-03-12 NOTE — Telephone Encounter (Signed)
Are you asking me to place this referral?

## 2012-03-13 NOTE — Telephone Encounter (Signed)
He would be great for the patient to see.

## 2012-04-03 ENCOUNTER — Encounter: Payer: Self-pay | Admitting: Internal Medicine

## 2012-04-15 ENCOUNTER — Ambulatory Visit: Payer: Self-pay | Admitting: Physical Medicine and Rehabilitation

## 2012-04-20 ENCOUNTER — Other Ambulatory Visit: Payer: Self-pay | Admitting: Internal Medicine

## 2012-09-15 ENCOUNTER — Encounter: Payer: Self-pay | Admitting: Internal Medicine

## 2012-09-15 ENCOUNTER — Ambulatory Visit (INDEPENDENT_AMBULATORY_CARE_PROVIDER_SITE_OTHER): Payer: BC Managed Care – PPO | Admitting: Internal Medicine

## 2012-09-15 VITALS — BP 134/72 | HR 74 | Temp 97.9°F | Ht 63.0 in | Wt 154.0 lb

## 2012-09-15 DIAGNOSIS — F411 Generalized anxiety disorder: Secondary | ICD-10-CM

## 2012-09-15 DIAGNOSIS — Z23 Encounter for immunization: Secondary | ICD-10-CM

## 2012-09-15 DIAGNOSIS — M129 Arthropathy, unspecified: Secondary | ICD-10-CM

## 2012-09-15 DIAGNOSIS — E785 Hyperlipidemia, unspecified: Secondary | ICD-10-CM

## 2012-09-15 DIAGNOSIS — E039 Hypothyroidism, unspecified: Secondary | ICD-10-CM

## 2012-09-15 DIAGNOSIS — M1990 Unspecified osteoarthritis, unspecified site: Secondary | ICD-10-CM

## 2012-09-15 DIAGNOSIS — F419 Anxiety disorder, unspecified: Secondary | ICD-10-CM

## 2012-09-15 DIAGNOSIS — D649 Anemia, unspecified: Secondary | ICD-10-CM

## 2012-09-15 DIAGNOSIS — Z889 Allergy status to unspecified drugs, medicaments and biological substances status: Secondary | ICD-10-CM

## 2012-09-15 DIAGNOSIS — I10 Essential (primary) hypertension: Secondary | ICD-10-CM

## 2012-09-15 DIAGNOSIS — Z9109 Other allergy status, other than to drugs and biological substances: Secondary | ICD-10-CM

## 2012-09-15 LAB — CBC WITH DIFFERENTIAL/PLATELET
Basophils Absolute: 0 10*3/uL (ref 0.0–0.1)
Eosinophils Relative: 2.4 % (ref 0.0–5.0)
HCT: 42.9 % (ref 36.0–46.0)
Hemoglobin: 13.8 g/dL (ref 12.0–15.0)
Lymphocytes Relative: 30.3 % (ref 12.0–46.0)
Lymphs Abs: 1.5 10*3/uL (ref 0.7–4.0)
Monocytes Relative: 7.3 % (ref 3.0–12.0)
Platelets: 264 10*3/uL (ref 150.0–400.0)
RDW: 13.2 % (ref 11.5–14.6)
WBC: 4.8 10*3/uL (ref 4.5–10.5)

## 2012-09-15 LAB — COMPREHENSIVE METABOLIC PANEL
ALT: 31 U/L (ref 0–35)
AST: 32 U/L (ref 0–37)
Albumin: 4.6 g/dL (ref 3.5–5.2)
Alkaline Phosphatase: 79 U/L (ref 39–117)
Calcium: 9.8 mg/dL (ref 8.4–10.5)
Chloride: 101 mEq/L (ref 96–112)
Potassium: 4.1 mEq/L (ref 3.5–5.1)
Sodium: 137 mEq/L (ref 135–145)

## 2012-09-15 LAB — MICROALBUMIN / CREATININE URINE RATIO
Microalb Creat Ratio: 2 mg/g (ref 0.0–30.0)
Microalb, Ur: 1.2 mg/dL (ref 0.0–1.9)

## 2012-09-15 LAB — LIPID PANEL
HDL: 61.7 mg/dL (ref >39.00)
LDL Cholesterol: 101 mg/dL — ABNORMAL HIGH (ref 0–99)
Total CHOL/HDL Ratio: 3

## 2012-09-15 LAB — TSH: TSH: 1.29 u[IU]/mL (ref 0.35–5.50)

## 2012-09-15 MED ORDER — ALPRAZOLAM 0.25 MG PO TABS: 0.2500 mg | ORAL_TABLET | Freq: Three times a day (TID) | ORAL | Status: DC | PRN

## 2012-09-15 MED ORDER — SIMVASTATIN 20 MG PO TABS
20.0000 mg | ORAL_TABLET | Freq: Every day | ORAL | Status: DC
Start: 2012-09-15 — End: 2013-03-20

## 2012-09-15 MED ORDER — LEVOTHYROXINE SODIUM 50 MCG PO TABS: 50.0000 ug | ORAL_TABLET | Freq: Every day | ORAL | Status: DC

## 2012-09-15 MED ORDER — MONTELUKAST SODIUM 10 MG PO TABS: 10.0000 mg | ORAL_TABLET | Freq: Every day | ORAL | Status: DC

## 2012-09-15 NOTE — Assessment & Plan Note (Signed)
Will check TSH with labs. Continue Synthroid. 

## 2012-09-15 NOTE — Assessment & Plan Note (Signed)
Will check lipids and LFTs with labs today. 

## 2012-09-15 NOTE — Assessment & Plan Note (Signed)
Symptoms well controlled with intermittent use of Xanax. We'll continue. 

## 2012-09-15 NOTE — Assessment & Plan Note (Signed)
Blood pressure well-controlled on current medications. Will continue. Will check renal function with labs today. 

## 2012-09-15 NOTE — Progress Notes (Signed)
Subjective:    Patient ID: Mary Pacheco, female    DOB: 02/26/1941, 71 y.o.   MRN: [id number]  HPI 71 year old female with history of hypertension, hyperlipidemia, anxiety, and arthritis presents for followup. She reports she is generally doing well. She reports symptoms of arthritis pain are significantly improved with physical therapy and exercise. She reports improved mobility as well. In regards to chronic medical conditions, she reports compliance with medications. She denies any symptoms of fatigue, change in appetite, change in bowel habits, chest pain, shortness of breath. She has no new concerns today.  Outpatient Encounter Prescriptions as of 09/15/2012  Medication Sig Dispense Refill  . acyclovir (ZOVIRAX) 400 MG tablet Take 1 tablet (400 mg total) by mouth daily.  90 tablet  3  . ALPRAZolam (XANAX) 0.25 MG tablet Take 1 tablet (0.25 mg total) by mouth 3 (three) times daily as needed.  90 tablet  3  . aspirin 81 MG tablet Take 81 mg by mouth daily.        . cholecalciferol (VITAMIN D) 1000 UNITS tablet Take 1,000 Units by mouth daily.        . diphenhydrAMINE (BENADRYL) 25 MG tablet Take 25 mg by mouth as needed. Itching.      . esomeprazole (NEXIUM) 40 MG capsule Take 1 capsule (40 mg total) by mouth daily.  90 capsule  3  . hydrochlorothiazide (MICROZIDE) 12.5 MG capsule Take 1 capsule (12.5 mg total) by mouth daily.  90 capsule  3  . levothyroxine (SYNTHROID) 50 MCG tablet Take 1 tablet (50 mcg total) by mouth daily.  90 tablet  2  . Misc Natural Products (TOTAL CARDIO HEALTH FORMULA PO) Take 1 tablet by mouth daily.        . montelukast (SINGULAIR) 10 MG tablet Take 1 tablet (10 mg total) by mouth at bedtime.  90 tablet  2  . Multiple Vitamin (MULTIVITAMIN) capsule Take 1 capsule by mouth daily.        . simvastatin (ZOCOR) 20 MG tablet Take 1 tablet (20 mg total) by mouth daily.  90 tablet  2  . traMADol (ULTRAM) 50 MG tablet Take 50 mg by mouth every 8 (eight) hours as needed.  Pain.      . triamcinolone (KENALOG) 0.1 % cream Apply 1 application topically as needed.         BP 134/72  Pulse 74  Temp 97.9 F (36.6 C)  Ht 5\' 3"  (1.6 m)  Wt 154 lb (69.854 kg)  BMI 27.28 kg/m2  SpO2 98%  Review of Systems  Constitutional: Negative for fever, chills, appetite change, fatigue and unexpected weight change.  HENT: Negative for ear pain, congestion, sore throat, trouble swallowing, neck pain, voice change and sinus pressure.   Eyes: Negative for visual disturbance.  Respiratory: Negative for cough, shortness of breath, wheezing and stridor.   Cardiovascular: Negative for chest pain, palpitations and leg swelling.  Gastrointestinal: Negative for nausea, vomiting, abdominal pain, diarrhea, constipation, blood in stool, abdominal distention and anal bleeding.  Genitourinary: Negative for dysuria and flank pain.  Musculoskeletal: Positive for arthralgias. Negative for myalgias and gait problem.  Skin: Negative for color change and rash.  Neurological: Negative for dizziness and headaches.  Hematological: Negative for adenopathy. Does not bruise/bleed easily.  Psychiatric/Behavioral: Negative for suicidal ideas, sleep disturbance and dysphoric mood. The patient is not nervous/anxious.        Objective:   Physical Exam  Constitutional: She is oriented to person, place, and time. She appears  well-developed and well-nourished. No distress.  HENT:  Head: Normocephalic and atraumatic.  Right Ear: External ear normal.  Left Ear: External ear normal.  Nose: Nose normal.  Mouth/Throat: Oropharynx is clear and moist. No oropharyngeal exudate.  Eyes: Conjunctivae normal are normal. Pupils are equal, round, and reactive to light. Right eye exhibits no discharge. Left eye exhibits no discharge. No scleral icterus.  Neck: Normal range of motion. Neck supple. No tracheal deviation present. No thyromegaly present.  Cardiovascular: Normal rate, regular rhythm, normal heart sounds  and intact distal pulses.  Exam reveals no gallop and no friction rub.   No murmur heard. Pulmonary/Chest: Effort normal and breath sounds normal. No respiratory distress. She has no wheezes. She has no rales. She exhibits no tenderness.  Musculoskeletal: Normal range of motion. She exhibits no edema and no tenderness.  Lymphadenopathy:    She has no cervical adenopathy.  Neurological: She is alert and oriented to person, place, and time. No cranial nerve deficit. She exhibits normal muscle tone. Coordination normal.  Skin: Skin is warm and dry. No rash noted. She is not diaphoretic. No erythema. No pallor.  Psychiatric: She has a normal mood and affect. Her behavior is normal. Judgment and thought content normal.          Assessment & Plan:

## 2012-09-15 NOTE — Assessment & Plan Note (Signed)
Symptoms of arthritis pain are well controlled with physical therapy and exercise regimen. We'll continue to monitor.

## 2013-01-07 ENCOUNTER — Telehealth: Payer: Self-pay | Admitting: Internal Medicine

## 2013-01-07 NOTE — Telephone Encounter (Signed)
Pt states [facility] told her to let us know that we need to call her insurance company to get a copy of the formulary exception list [phone]) because Singulair is no longer covered for the pt.  Pt states she tried generics years ago an the only thing she did well with was the Singulair.  Please advise the pt via cell.

## 2013-01-09 NOTE — Telephone Encounter (Signed)
Spoke with patient and she states Singulair was not on her formulary and Dr. Dan Humphreys needs to have this put back on there.

## 2013-01-09 NOTE — Telephone Encounter (Signed)
Mary Pacheco, could you please call this number for a PA?

## 2013-01-16 NOTE — Telephone Encounter (Signed)
Called [phone] for prior authorization on the Singulair form is being faxed over now

## 2013-01-20 ENCOUNTER — Other Ambulatory Visit: Payer: Self-pay | Admitting: Internal Medicine

## 2013-01-20 NOTE — Telephone Encounter (Signed)
Pt called back checking on singular  Prior auth Please advise pt status on this.  Pt has 24 pills left Pt does not want to run out of her meds

## 2013-01-20 NOTE — Telephone Encounter (Signed)
Eprescribed.

## 2013-01-30 NOTE — Telephone Encounter (Signed)
Have not received a form, but I will call now and see what is going on

## 2013-01-30 NOTE — Telephone Encounter (Signed)
Mary Pacheco, have you heard anything about this?

## 2013-01-30 NOTE — Telephone Encounter (Signed)
Called [phone] again For prior authorization in Singulair form is being faxed over   Case number [id number]

## 2013-02-03 ENCOUNTER — Telehealth: Payer: Self-pay

## 2013-02-03 DIAGNOSIS — I10 Essential (primary) hypertension: Secondary | ICD-10-CM

## 2013-02-03 MED ORDER — HYDROCHLOROTHIAZIDE 12.5 MG PO CAPS: 12.5000 mg | ORAL_CAPSULE | Freq: Every day | ORAL | Status: DC

## 2013-02-03 NOTE — Telephone Encounter (Signed)
Per patient she has tried [employer] and Claritin, one made her worse and the other one did not help. Not sure when she tried them but she does remember doing so, she has been taking Singulair for years not sure why they are not approving it this time.

## 2013-02-03 NOTE — Telephone Encounter (Signed)
OK. I have filled out paperwork.

## 2013-02-03 NOTE — Telephone Encounter (Signed)
[facility] with 3 refills

## 2013-03-10 ENCOUNTER — Telehealth: Payer: Self-pay | Admitting: *Deleted

## 2013-03-10 NOTE — Telephone Encounter (Signed)
Patient left a voicemail stating her appointment has been changed, she would like to know if she should be fasting when she comes in for her appointment?

## 2013-03-10 NOTE — Telephone Encounter (Signed)
If her appointment is in the afternoon, she does not need to fast.

## 2013-03-11 NOTE — Telephone Encounter (Signed)
Patient verbally informed and agreed.

## 2013-03-16 ENCOUNTER — Encounter: Payer: BC Managed Care – PPO | Admitting: Internal Medicine

## 2013-03-17 ENCOUNTER — Encounter: Payer: BC Managed Care – PPO | Admitting: Internal Medicine

## 2013-03-20 ENCOUNTER — Telehealth: Payer: Self-pay | Admitting: *Deleted

## 2013-03-20 ENCOUNTER — Ambulatory Visit (INDEPENDENT_AMBULATORY_CARE_PROVIDER_SITE_OTHER): Payer: BC Managed Care – PPO | Admitting: Internal Medicine

## 2013-03-20 ENCOUNTER — Encounter: Payer: Self-pay | Admitting: Internal Medicine

## 2013-03-20 VITALS — BP 154/80 | HR 71 | Temp 98.0°F | Ht 63.5 in | Wt 154.0 lb

## 2013-03-20 DIAGNOSIS — R5381 Other malaise: Secondary | ICD-10-CM

## 2013-03-20 DIAGNOSIS — R519 Headache, unspecified: Secondary | ICD-10-CM | POA: Insufficient documentation

## 2013-03-20 DIAGNOSIS — D649 Anemia, unspecified: Secondary | ICD-10-CM

## 2013-03-20 DIAGNOSIS — R51 Headache: Secondary | ICD-10-CM

## 2013-03-20 DIAGNOSIS — R5383 Other fatigue: Secondary | ICD-10-CM

## 2013-03-20 DIAGNOSIS — F419 Anxiety disorder, unspecified: Secondary | ICD-10-CM

## 2013-03-20 DIAGNOSIS — F411 Generalized anxiety disorder: Secondary | ICD-10-CM

## 2013-03-20 DIAGNOSIS — E039 Hypothyroidism, unspecified: Secondary | ICD-10-CM

## 2013-03-20 DIAGNOSIS — E785 Hyperlipidemia, unspecified: Secondary | ICD-10-CM

## 2013-03-20 DIAGNOSIS — I10 Essential (primary) hypertension: Secondary | ICD-10-CM

## 2013-03-20 DIAGNOSIS — Z0000 Encounter for general adult medical examination without abnormal findings: Secondary | ICD-10-CM

## 2013-03-20 LAB — CBC WITH DIFFERENTIAL/PLATELET
Basophils Relative: 0.6 % (ref 0.0–3.0)
Eosinophils Absolute: 0.2 10*3/uL (ref 0.0–0.7)
Lymphocytes Relative: 29 % (ref 12.0–46.0)
MCHC: 33.9 g/dL (ref 30.0–36.0)
Monocytes Absolute: 0.4 10*3/uL (ref 0.1–1.0)
Neutrophils Relative %: 60.4 % (ref 43.0–77.0)
Platelets: 220 10*3/uL (ref 150.0–400.0)
RBC: 5.35 Mil/uL — ABNORMAL HIGH (ref 3.87–5.11)
WBC: 5.6 10*3/uL (ref 4.5–10.5)

## 2013-03-20 LAB — LIPID PANEL
LDL Cholesterol: 98 mg/dL (ref 0–99)
Total CHOL/HDL Ratio: 3
Triglycerides: 125 mg/dL (ref 0.0–149.0)
VLDL: 25 mg/dL (ref 0.0–40.0)

## 2013-03-20 LAB — COMPREHENSIVE METABOLIC PANEL
AST: 31 U/L (ref 0–37)
Albumin: 4.4 g/dL (ref 3.5–5.2)
Alkaline Phosphatase: 79 U/L (ref 39–117)
Potassium: 4.7 mEq/L (ref 3.5–5.1)
Sodium: 139 mEq/L (ref 135–145)
Total Bilirubin: 0.6 mg/dL (ref 0.3–1.2)
Total Protein: 7.3 g/dL (ref 6.0–8.3)

## 2013-03-20 LAB — MICROALBUMIN / CREATININE URINE RATIO
Creatinine,U: 25.8 mg/dL
Microalb Creat Ratio: 5.4 mg/g (ref 0.0–30.0)
Microalb, Ur: 1.4 mg/dL (ref 0.0–1.9)

## 2013-03-20 LAB — C-REACTIVE PROTEIN: CRP: <0.5 mg/dL (ref 0.5–20.0)

## 2013-03-20 MED ORDER — ALPRAZOLAM 0.25 MG PO TABS: 0.2500 mg | ORAL_TABLET | Freq: Three times a day (TID) | ORAL | Status: DC | PRN

## 2013-03-20 MED ORDER — ACYCLOVIR 400 MG PO TABS: ORAL_TABLET | ORAL | Status: DC

## 2013-03-20 MED ORDER — HYDROCHLOROTHIAZIDE 12.5 MG PO CAPS: 12.5000 mg | ORAL_CAPSULE | Freq: Every day | ORAL | Status: DC

## 2013-03-20 MED ORDER — SIMVASTATIN 20 MG PO TABS: 20.0000 mg | ORAL_TABLET | Freq: Every day | ORAL | Status: DC

## 2013-03-20 MED ORDER — LEVOTHYROXINE SODIUM 50 MCG PO TABS: 50.0000 ug | ORAL_TABLET | Freq: Every day | ORAL | Status: DC

## 2013-03-20 NOTE — Telephone Encounter (Signed)
Pt came in for labs today (05.30.2014) and asked if she can have the results mailed to her

## 2013-03-20 NOTE — Assessment & Plan Note (Signed)
General medical exam normal today including breast exam. PAP and pelvic deferred given pt age and h/o complete hysterectomy. Encouraged healthy diet and continued regular physical activity. Will check labs including CMP, CBC, lipids, TSH. EKG normal today. Requested results on recent carotid doppler. Reviewed falls prevention given h/o left knee osteoarthritis. Follow up 4 weeks.

## 2013-03-20 NOTE — Assessment & Plan Note (Signed)
Symptoms well controlled on current medication. Will continue. 

## 2013-03-20 NOTE — Assessment & Plan Note (Signed)
BP Readings from Last 3 Encounters:  03/20/13 154/80  09/15/12 134/72  03/10/12 122/79   BP generally well controlled on HCTZ alone. Will continue to monitor. Will check renal function with labs today.

## 2013-03-20 NOTE — Assessment & Plan Note (Signed)
Will check lipids and LFTs with labs today. 

## 2013-03-20 NOTE — Progress Notes (Signed)
Subjective:    Patient ID: Mary Pacheco, female    DOB: Oct 29, 1940, 72 y.o.   MRN: [id number]  HPI The patient is here for annual Medicare wellness examination and management of other chronic and acute problems.   The risk factors are reflected in the social history.  The roster of all physicians providing medical care to patient - is listed in the Snapshot section of the chart.  Activities of daily living:  The patient is 100% independent in all ADLs: dressing, toileting, feeding as well as independent mobility  Home safety : The patient has smoke detectors in the home. They wear seatbelts.  There are no firearms at home. There is no violence in the home.   There is no risks for hepatitis, STDs or HIV. There is a history of blood transfusion at age 66 years old. They have no travel history to infectious disease endemic areas of the world.  The patient has seen their dentist in the last six month. Dentist - Dr. Toni Arthurs They have seen their eye doctor in the last year. Dr. Fredric Mare in [place] Has hearing aids but does not wear them. They have deferred audiologic testing in the last year.  Plans to have hearing rechecked this year. They do not  have excessive sun exposure. Discussed the need for sun protection: hats, long sleeves and use of sunscreen if there is significant sun exposure.  Dermatologist - Dr. Cheree Ditto.  Diet: the importance of a healthy diet is discussed. They do have a healthy diet.  The benefits of regular aerobic exercise were discussed. She exercises 3-5 times per week with walking. Limited by left knee.  Depression screen: there are no signs or vegative symptoms of depression- irritability, change in appetite, anhedonia, sadness/tearfullness.  Cognitive assessment: the patient manages all their financial and personal affairs and is actively engaged. They could relate day,date,year and events.  The following portions of the patient's history were reviewed and updated as  appropriate: allergies, current medications, past family history, past medical history,  past surgical history, past social history  and problem list.  Visual acuity was not assessed per patient preference since she has regular follow up with her ophthalmologist. Hearing and body mass index were assessed and reviewed.   During the course of the visit the patient was educated and counseled about appropriate screening and preventive services including : fall prevention , diabetes screening, nutrition counseling, colorectal cancer screening, and recommended immunizations.    Pt notes several week h/o progressive fatigue. Denies any focal symptoms such as chest pain, dyspnea. Notes trouble sleeping because of left knee pain. Has occasional bilateral temporal headache, however this has been ongoing for years. No change in weight, appetite. No GI symptoms.  Outpatient Encounter Prescriptions as of 03/20/2013  Medication Sig Dispense Refill  . acyclovir (ZOVIRAX) 400 MG tablet TAKE 1 TABLET BY MOUTH DAILY  90 tablet  1  . ALPRAZolam (XANAX) 0.25 MG tablet Take 1 tablet (0.25 mg total) by mouth 3 (three) times daily as needed for anxiety.  90 tablet  3  . aspirin 81 MG tablet Take 81 mg by mouth daily.        . cholecalciferol (VITAMIN D) 1000 UNITS tablet Take 1,000 Units by mouth daily.        . Coenzyme Q10 (EQL COQ10) 300 MG CAPS Take by mouth.      . esomeprazole (NEXIUM) 40 MG capsule Take 1 capsule (40 mg total) by mouth daily.  90 capsule  3  .  Flaxseed, Linseed, (FLAX SEED OIL PO) Take 1,400 mg by mouth daily.      . hydrochlorothiazide (MICROZIDE) 12.5 MG capsule Take 1 capsule (12.5 mg total) by mouth daily.  90 capsule  4  . levothyroxine (SYNTHROID) 50 MCG tablet Take 1 tablet (50 mcg total) by mouth daily.  90 tablet  4  . Misc Natural Products (TOTAL CARDIO HEALTH FORMULA PO) Take 1 tablet by mouth daily.        . montelukast (SINGULAIR) 10 MG tablet Take 1 tablet (10 mg total) by mouth at  bedtime.  90 tablet  2  . Multiple Vitamin (MULTIVITAMIN) capsule Take 1 capsule by mouth daily.        . Omega-3 Fatty Acids (FISH OIL TRIPLE STRENGTH) 1400 MG CAPS Take by mouth.      . simvastatin (ZOCOR) 20 MG tablet Take 1 tablet (20 mg total) by mouth daily.  90 tablet  4  . diphenhydrAMINE (BENADRYL) 25 MG tablet Take 25 mg by mouth as needed. Itching.      . traMADol (ULTRAM) 50 MG tablet Take 50 mg by mouth every 8 (eight) hours as needed. Pain.      . triamcinolone (KENALOG) 0.1 % cream Apply 1 application topically as needed.         No facility-administered encounter medications on file as of 03/20/2013.   BP 154/80  Pulse 71  Temp(Src) 98 F (36.7 C) (Oral)  Ht 5' 3.5" (1.613 m)  Wt 154 lb (69.854 kg)  BMI 26.85 kg/m2  SpO2 97%   Review of Systems  Constitutional: Positive for fatigue. Negative for fever, chills, appetite change and unexpected weight change.  HENT: Negative for ear pain, congestion, sore throat, trouble swallowing, neck pain, voice change and sinus pressure.   Eyes: Negative for visual disturbance.  Respiratory: Negative for cough, shortness of breath, wheezing and stridor.   Cardiovascular: Negative for chest pain, palpitations and leg swelling.  Gastrointestinal: Negative for nausea, vomiting, abdominal pain, diarrhea, constipation, blood in stool, abdominal distention and anal bleeding.  Genitourinary: Negative for dysuria and flank pain.  Musculoskeletal: Negative for myalgias, arthralgias and gait problem.  Skin: Negative for color change and rash.  Neurological: Positive for headaches. Negative for dizziness.  Hematological: Negative for adenopathy. Does not bruise/bleed easily.  Psychiatric/Behavioral: Negative for suicidal ideas, sleep disturbance and dysphoric mood. The patient is not nervous/anxious.        Objective:   Physical Exam  Constitutional: She is oriented to person, place, and time. She appears well-developed and well-nourished.  No distress.  HENT:  Head: Normocephalic and atraumatic.  Right Ear: External ear normal.  Left Ear: External ear normal.  Nose: Nose normal.  Mouth/Throat: Oropharynx is clear and moist. No oropharyngeal exudate.  Eyes: Conjunctivae are normal. Pupils are equal, round, and reactive to light. Right eye exhibits no discharge. Left eye exhibits no discharge. No scleral icterus.  Neck: Normal range of motion. Neck supple. No tracheal deviation present. No thyromegaly present.  Cardiovascular: Normal rate, regular rhythm, normal heart sounds and intact distal pulses.  Exam reveals no gallop and no friction rub.   No murmur heard. Pulmonary/Chest: Effort normal and breath sounds normal. No accessory muscle usage. Not tachypneic. No respiratory distress. She has no decreased breath sounds. She has no wheezes. She has no rales. She exhibits no tenderness. Right breast exhibits no inverted nipple, no mass, no nipple discharge, no skin change and no tenderness. Left breast exhibits no inverted nipple, no mass, no  nipple discharge, no skin change and no tenderness. Breasts are symmetrical.  Abdominal: Soft. Bowel sounds are normal. She exhibits no distension and no mass. There is no tenderness. There is no rebound and no guarding.  Musculoskeletal: Normal range of motion. She exhibits no edema and no tenderness.  Lymphadenopathy:    She has no cervical adenopathy.  Neurological: She is alert and oriented to person, place, and time. No cranial nerve deficit. She exhibits normal muscle tone. Coordination normal.  Skin: Skin is warm and dry. No rash noted. She is not diaphoretic. No erythema. No pallor.  Psychiatric: She has a normal mood and affect. Her behavior is normal. Judgment and thought content normal.          Assessment & Plan:

## 2013-03-20 NOTE — Assessment & Plan Note (Signed)
Some recent fatigue noted. Will repeat TSH with labs today.

## 2013-03-20 NOTE — Assessment & Plan Note (Signed)
Pt notes generalized fatigue with some temporal headache. Suspect fatigue related to OA pain keeping her from sleeping, however given temporal headache pain, question TA. Will check ESR and CRP with labs. Will also check CBC, CMP, TSH. Follow up 1 month.

## 2013-03-24 ENCOUNTER — Telehealth: Payer: Self-pay | Admitting: Internal Medicine

## 2013-03-24 NOTE — Telephone Encounter (Signed)
Follow up.

## 2013-03-30 ENCOUNTER — Ambulatory Visit (INDEPENDENT_AMBULATORY_CARE_PROVIDER_SITE_OTHER): Payer: BC Managed Care – PPO | Admitting: Internal Medicine

## 2013-03-30 ENCOUNTER — Encounter: Payer: Self-pay | Admitting: Internal Medicine

## 2013-03-30 VITALS — BP 150/72 | HR 80 | Temp 98.4°F | Wt 155.0 lb

## 2013-03-30 DIAGNOSIS — H6693 Otitis media, unspecified, bilateral: Secondary | ICD-10-CM | POA: Insufficient documentation

## 2013-03-30 DIAGNOSIS — H6690 Otitis media, unspecified, unspecified ear: Secondary | ICD-10-CM

## 2013-03-30 MED ORDER — AMOXICILLIN-POT CLAVULANATE 875-125 MG PO TABS: 1.0000 | ORAL_TABLET | Freq: Two times a day (BID) | ORAL | Status: DC

## 2013-03-30 MED ORDER — PREDNISONE (PAK) 10 MG PO TABS: 20.0000 mg | ORAL_TABLET | Freq: Every day | ORAL | Status: DC

## 2013-03-30 NOTE — Assessment & Plan Note (Signed)
Symptoms and exam are consistent with bilateral otitis media, likely secondary to chronic eustachian tube dysfunction. Will start Augmentin bid and if symptoms are not improving in 48hr, then will start Prednisone 20mg  daily x 5 days. Follow up for recheck in 4 weeks and prn.

## 2013-03-30 NOTE — Progress Notes (Signed)
Subjective:    Patient ID: Mary Pacheco, female    DOB: [birth year], 72 y.o.   MRN: [id number]  HPI 72YO female with 1 week h/o bilateral ear pain described as pressure, decreased hearing and dizziness. No fever, chills. Has not taken any medication for symptoms. Has some chronic hearing loss at baseline and uses hearing aids, however recently notes no improvement in hearing with use of aids.  Outpatient Encounter Prescriptions as of 03/30/2013  Medication Sig Dispense Refill  . acyclovir (ZOVIRAX) 400 MG tablet TAKE 1 TABLET BY MOUTH DAILY  90 tablet  1  . ALPRAZolam (XANAX) 0.25 MG tablet Take 1 tablet (0.25 mg total) by mouth 3 (three) times daily as needed for anxiety.  90 tablet  3  . aspirin 81 MG tablet Take 81 mg by mouth daily.        . cholecalciferol (VITAMIN D) 1000 UNITS tablet Take 1,000 Units by mouth daily.        . Coenzyme Q10 (EQL COQ10) 300 MG CAPS Take by mouth.      . diphenhydrAMINE (BENADRYL) 25 MG tablet Take 25 mg by mouth as needed. Itching.      . esomeprazole (NEXIUM) 40 MG capsule Take 1 capsule (40 mg total) by mouth daily.  90 capsule  3  . Flaxseed, Linseed, (FLAX SEED OIL PO) Take 1,400 mg by mouth daily.      . hydrochlorothiazide (MICROZIDE) 12.5 MG capsule Take 1 capsule (12.5 mg total) by mouth daily.  90 capsule  4  . levothyroxine (SYNTHROID) 50 MCG tablet Take 1 tablet (50 mcg total) by mouth daily.  90 tablet  4  . Misc Natural Products (TOTAL CARDIO HEALTH FORMULA PO) Take 1 tablet by mouth daily.        . montelukast (SINGULAIR) 10 MG tablet Take 1 tablet (10 mg total) by mouth at bedtime.  90 tablet  2  . Omega-3 Fatty Acids (FISH OIL TRIPLE STRENGTH) 1400 MG CAPS Take by mouth.      . simvastatin (ZOCOR) 20 MG tablet Take 1 tablet (20 mg total) by mouth daily.  90 tablet  4  . triamcinolone (KENALOG) 0.1 % cream Apply 1 application topically as needed.        [name] amoxicillin-clavulanate (AUGMENTIN) 875-125 MG per tablet Take 1 tablet by mouth 2 (two)  times daily.  20 tablet  0  . Multiple Vitamin (MULTIVITAMIN) capsule Take 1 capsule by mouth daily.        . predniSONE (STERAPRED UNI-PAK) 10 MG tablet Take 2 tablets (20 mg total) by mouth daily.  10 tablet  0  . traMADol (ULTRAM) 50 MG tablet Take 50 mg by mouth every 8 (eight) hours as needed. Pain.       No facility-administered encounter medications on file as of 03/30/2013.   BP 150/72  Pulse 80  Temp(Src) 98.4 F (36.9 C) (Oral)  Wt 155 lb (70.308 kg)  BMI 27.02 kg/m2  SpO2 97%  Review of Systems  Constitutional: Negative for fever, chills and unexpected weight change.  HENT: Positive for hearing loss and ear pain. Negative for nosebleeds, congestion, sore throat, facial swelling, rhinorrhea, sneezing, mouth sores, trouble swallowing, neck pain, neck stiffness, voice change, postnasal drip, sinus pressure, tinnitus and ear discharge.   Eyes: Negative for pain, discharge, redness and visual disturbance.  Respiratory: Negative for cough, chest tightness, shortness of breath, wheezing and stridor.   Cardiovascular: Negative for chest pain, palpitations and leg swelling.  Musculoskeletal: Negative for  myalgias and arthralgias.  Skin: Negative for color change and rash.  Neurological: Negative for dizziness, weakness, light-headedness and headaches.  Hematological: Negative for adenopathy.       Objective:   Physical Exam  Constitutional: She is oriented to person, place, and time. She appears well-developed and well-nourished. No distress.  HENT:  Head: Normocephalic and atraumatic.  Right Ear: External ear normal. Tympanic membrane is erythematous and bulging. A middle ear effusion is present.  Left Ear: External ear normal. Tympanic membrane is erythematous and bulging. A middle ear effusion is present.  Nose: Nose normal.  Mouth/Throat: Oropharynx is clear and moist. No oropharyngeal exudate.  Eyes: Conjunctivae are normal. Pupils are equal, round, and reactive to light.  Right eye exhibits no discharge. Left eye exhibits no discharge. No scleral icterus.  Neck: Normal range of motion. Neck supple. No tracheal deviation present. No thyromegaly present.  Cardiovascular: Normal rate, regular rhythm, normal heart sounds and intact distal pulses.  Exam reveals no gallop and no friction rub.   No murmur heard. Pulmonary/Chest: Effort normal and breath sounds normal. No accessory muscle usage. Not tachypneic. No respiratory distress. She has no decreased breath sounds. She has no wheezes. She has no rhonchi. She has no rales. She exhibits no tenderness.  Musculoskeletal: Normal range of motion. She exhibits no edema and no tenderness.  Lymphadenopathy:    She has no cervical adenopathy.  Neurological: She is alert and oriented to person, place, and time. No cranial nerve deficit. She exhibits normal muscle tone. Coordination normal.  Skin: Skin is warm and dry. No rash noted. She is not diaphoretic. No erythema. No pallor.  Psychiatric: She has a normal mood and affect. Her behavior is normal. Judgment and thought content normal.          Assessment & Plan:

## 2013-04-28 ENCOUNTER — Encounter: Payer: Self-pay | Admitting: Internal Medicine

## 2013-04-28 ENCOUNTER — Ambulatory Visit (INDEPENDENT_AMBULATORY_CARE_PROVIDER_SITE_OTHER): Payer: BC Managed Care – PPO | Admitting: Internal Medicine

## 2013-04-28 VITALS — BP 156/88 | HR 69 | Temp 98.3°F | Wt 154.0 lb

## 2013-04-28 DIAGNOSIS — H6690 Otitis media, unspecified, unspecified ear: Secondary | ICD-10-CM

## 2013-04-28 DIAGNOSIS — I10 Essential (primary) hypertension: Secondary | ICD-10-CM

## 2013-04-28 DIAGNOSIS — Z6825 Body mass index (BMI) 25.0-25.9, adult: Secondary | ICD-10-CM

## 2013-04-28 DIAGNOSIS — H6693 Otitis media, unspecified, bilateral: Secondary | ICD-10-CM

## 2013-04-28 DIAGNOSIS — K219 Gastro-esophageal reflux disease without esophagitis: Secondary | ICD-10-CM

## 2013-04-28 DIAGNOSIS — E663 Overweight: Secondary | ICD-10-CM | POA: Insufficient documentation

## 2013-04-28 MED ORDER — ESOMEPRAZOLE MAGNESIUM 40 MG PO CPDR: 40.0000 mg | DELAYED_RELEASE_CAPSULE | Freq: Every day | ORAL | Status: DC

## 2013-04-28 NOTE — Assessment & Plan Note (Signed)
Recheck ears today. Completed augmentin and prednisone. Symptomatically improved. Exam normal except for chronic mild hearing loss. Will continue to monitor.

## 2013-04-28 NOTE — Assessment & Plan Note (Signed)
BP Readings from Last 3 Encounters:  04/28/13 156/88  03/30/13 150/72  03/20/13 154/80   BP slightly elevated today but well controlled at home. Will continue current medications. Follow up 09/2013 or sooner as needed.

## 2013-04-28 NOTE — Progress Notes (Signed)
Subjective:    Patient ID: Mary Pacheco, female    DOB: 1941/08/06, 72 y.o.   MRN: [id number]  HPI 72YO female with HTN presents for follow up recent bilateral OM. Doing well. Pain and pressure resolved after prednisone and Augmentin. No fever, chills, congestion, ear pain. Mild chronic hearing loss. Has hearing aids but does not wear them, per preference.   In regards to HTN, BP generally well controlled at home. Compliant with medications. No recent headache, chest pain, palpitations.  Outpatient Encounter Prescriptions as of 04/28/2013  Medication Sig Dispense Refill  . acyclovir (ZOVIRAX) 400 MG tablet TAKE 1 TABLET BY MOUTH DAILY  90 tablet  1  . ALPRAZolam (XANAX) 0.25 MG tablet Take 1 tablet (0.25 mg total) by mouth 3 (three) times daily as needed for anxiety.  90 tablet  3  . aspirin 81 MG tablet Take 81 mg by mouth daily.        . cholecalciferol (VITAMIN D) 1000 UNITS tablet Take 1,000 Units by mouth daily.        . Coenzyme Q10 (EQL COQ10) 300 MG CAPS Take by mouth.      . esomeprazole (NEXIUM) 40 MG capsule Take 1 capsule (40 mg total) by mouth daily.  90 capsule  3  . Flaxseed, Linseed, (FLAX SEED OIL PO) Take 1,400 mg by mouth daily.      . hydrochlorothiazide (MICROZIDE) 12.5 MG capsule Take 1 capsule (12.5 mg total) by mouth daily.  90 capsule  4  . levothyroxine (SYNTHROID) 50 MCG tablet Take 1 tablet (50 mcg total) by mouth daily.  90 tablet  4  . Misc Natural Products (TOTAL CARDIO HEALTH FORMULA PO) Take 1 tablet by mouth daily.        . montelukast (SINGULAIR) 10 MG tablet Take 1 tablet (10 mg total) by mouth at bedtime.  90 tablet  2  . Omega-3 Fatty Acids (FISH OIL TRIPLE STRENGTH) 1400 MG CAPS Take by mouth.      . simvastatin (ZOCOR) 20 MG tablet Take 1 tablet (20 mg total) by mouth daily.  90 tablet  4  . triamcinolone (KENALOG) 0.1 % cream Apply 1 application topically as needed.        . diphenhydrAMINE (BENADRYL) 25 MG tablet Take 25 mg by mouth as needed.  Itching.      . traMADol (ULTRAM) 50 MG tablet Take 50 mg by mouth every 8 (eight) hours as needed. Pain.       No facility-administered encounter medications on file as of 04/28/2013.   BP 156/88  Pulse 69  Temp(Src) 98.3 F (36.8 C) (Oral)  Wt 154 lb (69.854 kg)  BMI 26.85 kg/m2  SpO2 97%  Review of Systems  Constitutional: Negative for fever, chills, appetite change, fatigue and unexpected weight change.  HENT: Positive for hearing loss. Negative for ear pain, congestion, sore throat, trouble swallowing, neck pain, voice change and sinus pressure.   Eyes: Negative for visual disturbance.  Respiratory: Negative for cough, shortness of breath, wheezing and stridor.   Cardiovascular: Negative for chest pain, palpitations and leg swelling.  Gastrointestinal: Negative for nausea, vomiting, abdominal pain, diarrhea, constipation, blood in stool, abdominal distention and anal bleeding.  Genitourinary: Negative for dysuria and flank pain.  Musculoskeletal: Negative for myalgias, arthralgias and gait problem.  Skin: Negative for color change and rash.  Neurological: Negative for dizziness and headaches.  Hematological: Negative for adenopathy. Does not bruise/bleed easily.  Psychiatric/Behavioral: Negative for suicidal ideas, sleep disturbance and dysphoric mood.  The patient is not nervous/anxious.        Objective:   Physical Exam  Constitutional: She is oriented to person, place, and time. She appears well-developed and well-nourished. No distress.  HENT:  Head: Normocephalic and atraumatic.  Right Ear: Tympanic membrane, external ear and ear canal normal. Decreased hearing is noted.  Left Ear: Tympanic membrane, external ear and ear canal normal. Decreased hearing is noted.  Nose: Nose normal.  Mouth/Throat: Oropharynx is clear and moist. No oropharyngeal exudate.  Eyes: Conjunctivae are normal. Pupils are equal, round, and reactive to light. Right eye exhibits no discharge. Left eye  exhibits no discharge. No scleral icterus.  Neck: Normal range of motion. Neck supple. No tracheal deviation present. No thyromegaly present.  Cardiovascular: Normal rate, regular rhythm, normal heart sounds and intact distal pulses.  Exam reveals no gallop and no friction rub.   No murmur heard. Pulmonary/Chest: Effort normal and breath sounds normal. No accessory muscle usage. Not tachypneic. No respiratory distress. She has no decreased breath sounds. She has no wheezes. She has no rhonchi. She has no rales. She exhibits no tenderness.  Musculoskeletal: Normal range of motion. She exhibits no edema and no tenderness.  Lymphadenopathy:    She has no cervical adenopathy.  Neurological: She is alert and oriented to person, place, and time. No cranial nerve deficit. She exhibits normal muscle tone. Coordination normal.  Skin: Skin is warm and dry. No rash noted. She is not diaphoretic. No erythema. No pallor.  Psychiatric: She has a normal mood and affect. Her behavior is normal. Judgment and thought content normal.          Assessment & Plan:

## 2013-04-28 NOTE — Assessment & Plan Note (Signed)
Wt Readings from Last 3 Encounters:  04/28/13 154 lb (69.854 kg)  03/30/13 155 lb (70.308 kg)  03/20/13 154 lb (69.854 kg)

## 2013-04-29 ENCOUNTER — Other Ambulatory Visit: Payer: Self-pay | Admitting: Internal Medicine

## 2013-05-05 ENCOUNTER — Encounter: Payer: Self-pay | Admitting: Internal Medicine

## 2013-06-02 ENCOUNTER — Telehealth: Payer: Self-pay | Admitting: Internal Medicine

## 2013-06-02 DIAGNOSIS — Z23 Encounter for immunization: Secondary | ICD-10-CM

## 2013-06-02 MED ORDER — ZOSTER VACCINE LIVE 19400 UNT/0.65ML ~~LOC~~ SOLR: 0.6500 mL | Freq: Once | SUBCUTANEOUS | Status: DC

## 2013-06-02 NOTE — Telephone Encounter (Signed)
Pt called and stated she went to [facility] [place] yesterday to get shingles vaccine, but was told by the pharmacy that if she gets a script from Dr. Dan Humphreys for the shingles vaccine she will have a better chance of her insurance helping to pay for the vaccine.  Pharmacy asked that we iInclude diagnosis code.  She was unsure but thought the code may be V04.81.    [facility] [place] Fax:  [phone]   Ph: [phone]  Asking that we call pt to let her know this has been done so she knows she can go back for the vaccine.

## 2013-06-02 NOTE — Telephone Encounter (Signed)
Fwd to Dr. Dan Humphreys, does it need a dx code to be included?

## 2013-06-02 NOTE — Telephone Encounter (Signed)
I have sent in the Rx

## 2013-06-02 NOTE — Telephone Encounter (Signed)
Patient informed prescription has been sent

## 2013-07-21 ENCOUNTER — Other Ambulatory Visit: Payer: Self-pay | Admitting: Internal Medicine

## 2013-07-22 ENCOUNTER — Other Ambulatory Visit: Payer: Self-pay | Admitting: Internal Medicine

## 2013-07-22 NOTE — Telephone Encounter (Signed)
Eprescribed.

## 2013-09-23 ENCOUNTER — Ambulatory Visit: Payer: BC Managed Care – PPO | Admitting: Internal Medicine

## 2013-09-24 ENCOUNTER — Ambulatory Visit (INDEPENDENT_AMBULATORY_CARE_PROVIDER_SITE_OTHER): Payer: BC Managed Care – PPO | Admitting: Internal Medicine

## 2013-09-24 ENCOUNTER — Encounter: Payer: Self-pay | Admitting: Internal Medicine

## 2013-09-24 VITALS — BP 130/70 | HR 66 | Temp 97.6°F | Wt 152.0 lb

## 2013-09-24 DIAGNOSIS — H6993 Unspecified Eustachian tube disorder, bilateral: Secondary | ICD-10-CM

## 2013-09-24 DIAGNOSIS — I10 Essential (primary) hypertension: Secondary | ICD-10-CM

## 2013-09-24 DIAGNOSIS — F411 Generalized anxiety disorder: Secondary | ICD-10-CM

## 2013-09-24 DIAGNOSIS — H6983 Other specified disorders of Eustachian tube, bilateral: Secondary | ICD-10-CM

## 2013-09-24 DIAGNOSIS — H6990 Unspecified Eustachian tube disorder, unspecified ear: Secondary | ICD-10-CM | POA: Insufficient documentation

## 2013-09-24 DIAGNOSIS — F419 Anxiety disorder, unspecified: Secondary | ICD-10-CM

## 2013-09-24 DIAGNOSIS — H6980 Other specified disorders of Eustachian tube, unspecified ear: Secondary | ICD-10-CM

## 2013-09-24 LAB — COMPREHENSIVE METABOLIC PANEL
ALT: 32 U/L (ref 0–35)
Albumin: 4.5 g/dL (ref 3.5–5.2)
BUN: 22 mg/dL (ref 6–23)
CO2: 30 mEq/L (ref 19–32)
Calcium: 9.9 mg/dL (ref 8.4–10.5)
Chloride: 103 mEq/L (ref 96–112)
Creatinine, Ser: 0.9 mg/dL (ref 0.4–1.2)
GFR: 66.92 mL/min (ref >60.00)
Potassium: 4.5 mEq/L (ref 3.5–5.1)

## 2013-09-24 LAB — MICROALBUMIN / CREATININE URINE RATIO
Creatinine,U: 33.5 mg/dL
Microalb Creat Ratio: 4.5 mg/g (ref 0.0–30.0)

## 2013-09-24 MED ORDER — ALPRAZOLAM 0.25 MG PO TABS: 0.2500 mg | ORAL_TABLET | Freq: Three times a day (TID) | ORAL | Status: DC | PRN

## 2013-09-24 NOTE — Progress Notes (Signed)
Pre-visit discussion using our clinic review tool. No additional management support is needed unless otherwise documented below in the visit note.  

## 2013-09-24 NOTE — Assessment & Plan Note (Signed)
Symptoms well controlled with alprazolam as needed. Will continue. 

## 2013-09-24 NOTE — Assessment & Plan Note (Signed)
BP Readings from Last 3 Encounters:  09/24/13 130/70  04/28/13 156/88  03/30/13 150/72   Blood pressure well-controlled on current medication. Will continue.

## 2013-09-24 NOTE — Assessment & Plan Note (Signed)
Bilateral eustachian tube dysfunction with chronic middle ear effusion and recurrent otitis media. Will set up ENT evaluation. Question if she might benefit from tympanostomy tubes.

## 2013-09-24 NOTE — Progress Notes (Signed)
Subjective:    Patient ID: Mary Pacheco, female    DOB: 09/24/1941, 72 y.o.   MRN: [id number]  HPI 72 year old female with history of hypertension, anxiety presents for followup. She reports that she has been feeling well. She is compliant with medication. Her only concern is persistent bilateral ear pain and decreased hearing. This is been ongoing for years. She questions whether she may have recurrent ear infection. She denies any fever or chills. She denies nasal congestion.  Outpatient Encounter Prescriptions as of 09/24/2013  Medication Sig  . acyclovir (ZOVIRAX) 400 MG tablet TAKE 1 TABLET BY MOUTH DAILY  . ALPRAZolam (XANAX) 0.25 MG tablet Take 1 tablet (0.25 mg total) by mouth 3 (three) times daily as needed for anxiety.  [name] aspirin 81 MG tablet Take 81 mg by mouth daily.    . cholecalciferol (VITAMIN D) 1000 UNITS tablet Take 1,000 Units by mouth daily.    . Coenzyme Q10 (EQL COQ10) 300 MG CAPS Take by mouth.  . CRANBERRY PO Take by mouth.  . Flaxseed, Linseed, (FLAX SEED OIL PO) Take 1,400 mg by mouth daily.  . hydrochlorothiazide (MICROZIDE) 12.5 MG capsule Take 1 capsule (12.5 mg total) by mouth daily.  [name] levothyroxine (SYNTHROID) 50 MCG tablet Take 1 tablet (50 mcg total) by mouth daily.  . Misc Natural Products (TOTAL CARDIO HEALTH FORMULA PO) Take 1 tablet by mouth daily.    [name] NEXIUM 40 MG capsule TAKE ONE CAPSULE BY MOUTH EVERY DAY  . Omega-3 Fatty Acids (FISH OIL TRIPLE STRENGTH) 1400 MG CAPS Take by mouth.  . simvastatin (ZOCOR) 20 MG tablet Take 1 tablet (20 mg total) by mouth daily.  [name] SINGULAIR 10 MG tablet TAKE 1 TABLET AT BEDTIME  . triamcinolone ointment (KENALOG) 0.1 % APPLY THIN FILM TO AFFECTED AREAS BY TOPICAL ROUTE AS DIRECTED FOR 7 DAYS AS NEEDED   BP 130/70  Pulse 66  Temp(Src) 97.6 F (36.4 C) (Oral)  Wt 152 lb (68.947 kg)  SpO2 96%  Review of Systems  Constitutional: Negative for fever, chills, appetite change, fatigue and unexpected weight change.    HENT: Positive for ear pain and hearing loss. Negative for congestion, sinus pressure, sore throat, trouble swallowing and voice change.   Eyes: Negative for visual disturbance.  Respiratory: Negative for cough, shortness of breath, wheezing and stridor.   Cardiovascular: Negative for chest pain, palpitations and leg swelling.  Gastrointestinal: Negative for nausea, vomiting, abdominal pain, diarrhea, constipation, blood in stool, abdominal distention and anal bleeding.  Genitourinary: Negative for dysuria and flank pain.  Musculoskeletal: Negative for arthralgias, gait problem, myalgias and neck pain.  Skin: Negative for color change and rash.  Neurological: Negative for dizziness and headaches.  Hematological: Negative for adenopathy. Does not bruise/bleed easily.  Psychiatric/Behavioral: Negative for suicidal ideas, sleep disturbance and dysphoric mood. The patient is not nervous/anxious.        Objective:   Physical Exam  Constitutional: She is oriented to person, place, and time. She appears well-developed and well-nourished. No distress.  HENT:  Head: Normocephalic and atraumatic.  Right Ear: External ear normal. A middle ear effusion is present.  Left Ear: External ear normal. A middle ear effusion is present.  Nose: Nose normal.  Mouth/Throat: Oropharynx is clear and moist. No oropharyngeal exudate.  Eyes: Conjunctivae are normal. Pupils are equal, round, and reactive to light. Right eye exhibits no discharge. Left eye exhibits no discharge. No scleral icterus.  Neck: Normal range of motion. Neck supple. No tracheal deviation  present. No thyromegaly present.  Cardiovascular: Normal rate, regular rhythm, normal heart sounds and intact distal pulses.  Exam reveals no gallop and no friction rub.   No murmur heard. Pulmonary/Chest: Effort normal and breath sounds normal. No accessory muscle usage. Not tachypneic. No respiratory distress. She has no decreased breath sounds. She has no  wheezes. She has no rhonchi. She has no rales. She exhibits no tenderness.  Musculoskeletal: Normal range of motion. She exhibits no edema and no tenderness.  Lymphadenopathy:    She has no cervical adenopathy.  Neurological: She is alert and oriented to person, place, and time. No cranial nerve deficit. She exhibits normal muscle tone. Coordination normal.  Skin: Skin is warm and dry. No rash noted. She is not diaphoretic. No erythema. No pallor.  Psychiatric: She has a normal mood and affect. Her behavior is normal. Judgment and thought content normal.          Assessment & Plan:

## 2013-10-19 ENCOUNTER — Other Ambulatory Visit: Payer: Self-pay | Admitting: Internal Medicine

## 2013-10-27 ENCOUNTER — Telehealth: Payer: Self-pay | Admitting: Internal Medicine

## 2013-10-27 NOTE — Telephone Encounter (Signed)
Yes, please ask pt if she is okay with changing to Pantoprazole 40mg  daily rather than Nexium

## 2013-10-27 NOTE — Telephone Encounter (Signed)
Fwd to Dr. Walker 

## 2013-10-27 NOTE — Telephone Encounter (Signed)
Pt came into office to drop off envelop regarding medication being dropped from her insurance.  Placed in Dr. Thomes Dinning box.

## 2013-10-28 NOTE — Telephone Encounter (Signed)
Spoke with patient, she has tried that before and that is why she has went with the Nexium. If she buy it OTC she has to double up and she thinks she has about 1 month left. She dropped the paper off as sort of an FYI so when she calls for a refill she will let you know what she decide to do.

## 2013-11-06 ENCOUNTER — Ambulatory Visit: Payer: Self-pay | Admitting: Unknown Physician Specialty

## 2013-11-06 LAB — CREATININE, SERUM
Creatinine: 1 mg/dL (ref 0.60–1.30)
EGFR (African American): >60
GFR CALC NON AF AMER: 56 — AB

## 2013-11-13 ENCOUNTER — Ambulatory Visit: Payer: Self-pay | Admitting: Unknown Physician Specialty

## 2013-11-17 ENCOUNTER — Telehealth: Payer: Self-pay | Admitting: *Deleted

## 2013-11-17 MED ORDER — PANTOPRAZOLE SODIUM 40 MG PO TBEC: 40.0000 mg | DELAYED_RELEASE_TABLET | Freq: Every day | ORAL | Status: DC

## 2013-11-17 NOTE — Telephone Encounter (Signed)
Patient left voicemail about changing her reflux medication. Returned patient call, patient stated her insurance company will no longer cover Nexium. She would like to try the generic Protonix. Please advise. [facility] [place].

## 2013-11-17 NOTE — Addendum Note (Signed)
Addended by: Ronaldo Miyamoto on: 11/17/2013 05:15 PM   Modules accepted: Orders

## 2013-11-17 NOTE — Telephone Encounter (Signed)
Fine to call in Pantoprazole 40mg  daily #30 refill6

## 2013-11-17 NOTE — Telephone Encounter (Signed)
Prescription sent to pharmacy on file per patient request.

## 2013-11-24 ENCOUNTER — Encounter: Payer: Self-pay | Admitting: Cardiovascular Disease

## 2013-11-24 ENCOUNTER — Ambulatory Visit (INDEPENDENT_AMBULATORY_CARE_PROVIDER_SITE_OTHER): Payer: BC Managed Care – PPO | Admitting: Cardiovascular Disease

## 2013-11-24 ENCOUNTER — Telehealth: Payer: Self-pay | Admitting: *Deleted

## 2013-11-24 VITALS — BP 120/60 | HR 75 | Ht 64.0 in | Wt 155.2 lb

## 2013-11-24 DIAGNOSIS — E785 Hyperlipidemia, unspecified: Secondary | ICD-10-CM

## 2013-11-24 DIAGNOSIS — I10 Essential (primary) hypertension: Secondary | ICD-10-CM

## 2013-11-24 DIAGNOSIS — I6529 Occlusion and stenosis of unspecified carotid artery: Secondary | ICD-10-CM

## 2013-11-24 DIAGNOSIS — K219 Gastro-esophageal reflux disease without esophagitis: Secondary | ICD-10-CM

## 2013-11-24 DIAGNOSIS — R0789 Other chest pain: Secondary | ICD-10-CM

## 2013-11-24 DIAGNOSIS — E663 Overweight: Secondary | ICD-10-CM

## 2013-11-24 DIAGNOSIS — R079 Chest pain, unspecified: Secondary | ICD-10-CM

## 2013-11-24 MED ORDER — EZETIMIBE-SIMVASTATIN 10-20 MG PO TABS
1.0000 | ORAL_TABLET | Freq: Every day | ORAL | Status: DC
Start: 2013-11-24 — End: 2014-04-06

## 2013-11-24 NOTE — Assessment & Plan Note (Signed)
Blood pressure is well controlled on today's visit. No changes made to the medications. 

## 2013-11-24 NOTE — Progress Notes (Signed)
Patient ID: Mary Pacheco, female    DOB: 1941/03/14, 73 y.o.   MRN: [id number]  HPI Comments: 73 year old female with history of hypertension, hyperlipidemia, bilateral carotid arterial disease, worse on the right than the left, estimated at 60-69% on the right, 50% on the left, history of anxiety presenting for new patient evaluation.  She reports that over the past 6 months she has had some fatigue when she exerts herself. She previously used to exercise bike and now whenever she exercises, she has more fatigue. Has noticed fatigue when carrying groceries. Denies having any significant tightness in her chest. She does have mild shortness of breath with exertion. Uncertain if this is a sign of a problem for her. She does not do regular exercise as she spent more time taking care of her husband who has medical issues.  We spent much of her visit talking about cholesterol management. Her most recent cholesterol is 180. She has tolerate simvastatin 20 mg daily with no complications. She's not tried any higher doses She denies any smoking history, no diabetes. She tries to watch what she eats  She does report having history of GI ulcer/bleeding in 2003 which she attributes to NSAIDs which she was taking for arthritis. She recently switched from Nexium to Protonix. She does have some discomfort in her chest in the morning when she wakes up which she attributes to GERD.  She does have occasional left arm pain, numbness and tingling down into her hand.  EKG shows normal sinus rhythm with rate 75 beats per minute, nonspecific T wave abnormality Carotid Doppler showing right carotid stenosis, velocity 251 cm/sec   Outpatient Encounter Prescriptions as of 11/24/2013  Medication Sig  . acyclovir (ZOVIRAX) 400 MG tablet TAKE 1 TABLET BY MOUTH DAILY  . ALPRAZolam (XANAX) 0.25 MG tablet Take 1 tablet (0.25 mg total) by mouth 3 (three) times daily as needed for anxiety.  [name] aspirin 81 MG tablet Take 81 mg by  mouth daily.    . cholecalciferol (VITAMIN D) 1000 UNITS tablet Take 1,000 Units by mouth daily.    . Coenzyme Q10 (EQL COQ10) 300 MG CAPS Take by mouth.  . CRANBERRY PO Take by mouth.  . Flaxseed, Linseed, (FLAX SEED OIL PO) Take 1,400 mg by mouth daily.  . hydrochlorothiazide (MICROZIDE) 12.5 MG capsule Take 1 capsule (12.5 mg total) by mouth daily.  [name] levothyroxine (SYNTHROID) 50 MCG tablet Take 1 tablet (50 mcg total) by mouth daily.  . Misc Natural Products (TOTAL CARDIO HEALTH FORMULA PO) Take 1 tablet by mouth daily.    . Omega-3 Fatty Acids (FISH OIL TRIPLE STRENGTH) 1400 MG CAPS Take by mouth.  . pantoprazole (PROTONIX) 40 MG tablet Take 1 tablet (40 mg total) by mouth daily.  [name] SINGULAIR 10 MG tablet TAKE 1 TABLET AT BEDTIME  . triamcinolone ointment (KENALOG) 0.1 % APPLY THIN FILM TO AFFECTED AREAS BY TOPICAL ROUTE AS DIRECTED FOR 7 DAYS AS NEEDED  .  simvastatin (ZOCOR) 20 MG tablet Take 1 tablet (20 mg total) by mouth daily.  . [DISCONTINUED] NEXIUM 40 MG capsule TAKE ONE CAPSULE BY MOUTH EVERY DAY     Review of Systems  Constitutional: Negative.   HENT: Negative.   Eyes: Negative.   Respiratory: Negative.   Cardiovascular: Negative.   Gastrointestinal: Negative.   Endocrine: Negative.   Musculoskeletal: Negative.   Skin: Negative.   Allergic/Immunologic: Negative.   Neurological: Negative.   Hematological: Negative.   Psychiatric/Behavioral: Negative.   All other systems  reviewed and are negative.    BP 120/60  Pulse 75  Ht 5\' 4"  (1.626 m)  Wt 155 lb 4 oz (70.421 kg)  BMI 26.64 kg/m2  Physical Exam  Nursing note and vitals reviewed. Constitutional: She is oriented to person, place, and time. She appears well-developed and well-nourished.  HENT:  Head: Normocephalic.  Nose: Nose normal.  Mouth/Throat: Oropharynx is clear and moist.  Eyes: Conjunctivae are normal. Pupils are equal, round, and reactive to light.  Neck: Normal range of motion. Neck supple.  No JVD present.  Cardiovascular: Normal rate, regular rhythm, S1 normal, S2 normal, normal heart sounds and intact distal pulses.  Exam reveals no gallop and no friction rub.   No murmur heard. Pulmonary/Chest: Effort normal and breath sounds normal. No respiratory distress. She has no wheezes. She has no rales. She exhibits no tenderness.  Abdominal: Soft. Bowel sounds are normal. She exhibits no distension. There is no tenderness.  Musculoskeletal: Normal range of motion. She exhibits no edema and no tenderness.  Lymphadenopathy:    She has no cervical adenopathy.  Neurological: She is alert and oriented to person, place, and time. Coordination normal.  Skin: Skin is warm and dry. No rash noted. No erythema.  Psychiatric: She has a normal mood and affect. Her behavior is normal. Judgment and thought content normal.    Assessment and Plan

## 2013-11-24 NOTE — Assessment & Plan Note (Signed)
Given her severe carotid disease, colon LDL less than 70. Discussed various options with her. She would like to try vytorin 10/20 mg daily. If LDL continues to run greater than 70, could increase dose up to 10/40 mg daily

## 2013-11-24 NOTE — Assessment & Plan Note (Signed)
We have encouraged continued exercise, careful diet management in an effort to lose weight. 

## 2013-11-24 NOTE — Assessment & Plan Note (Signed)
She reports having vague chest discomfort in the morning which she attributes to GERD. Some left arm pain at times not associated with exertion. We have talked about stress testing with her. If symptoms get worse, suggested she call our office and we would schedule a treadmill Myoview.

## 2013-11-24 NOTE — Assessment & Plan Note (Signed)
She recently switched from Nexium to Protonix. Suggested she raise the head of her bed.

## 2013-11-24 NOTE — Telephone Encounter (Signed)
Filled out prior authorization form for Vytorin 10-20 mg 1 tablet daily faxed to [facility] [id number]; awaiting approval.

## 2013-11-24 NOTE — Assessment & Plan Note (Signed)
Right greater than left carotid stenosis. Estimated at 60-69% on the right, 50% on the left. Suggested continued aggressive cholesterol management

## 2013-11-24 NOTE — Patient Instructions (Addendum)
You are doing well. Please hold the simvastatin Start vytorin 10/20 mg once a day  We can check the cholesterol in 3 months  Please call for chest pain symptoms concerning for heart blockage We would perform a stress test  Please call us if you have new issues that need to be addressed before your next appt.  Your physician wants you to follow-up in: 6 months.  You will receive a reminder letter in the mail two months in advance. If you don't receive a letter, please call our office to schedule the follow-up appointment.

## 2013-11-26 ENCOUNTER — Telehealth: Payer: Self-pay | Admitting: *Deleted

## 2013-11-26 NOTE — Telephone Encounter (Signed)
Patient called and she received her approval from [employer] for Vitorin

## 2013-11-26 NOTE — Telephone Encounter (Signed)
Spoke with pt and she mentioned that she is not for certain if she has been approved. We received denial forms for Vytorin. She is aware that we will continue to try to get her approved and will return her call as soon as we get a response.

## 2013-11-27 NOTE — Telephone Encounter (Signed)
Pt has been denied for Vytorin 10-20 mg. Pt is aware that we will notify MD to see if he would like for her to try alternate cholest. Med. Awaiting response from Dr. Rockey Situ.

## 2013-12-10 ENCOUNTER — Telehealth: Payer: Self-pay | Admitting: *Deleted

## 2013-12-10 NOTE — Telephone Encounter (Signed)
Patient called and Dr.Gollan put her on Vitorin and she is having severe aching in left arm. Please advise

## 2013-12-10 NOTE — Telephone Encounter (Signed)
Spoke w/ pt.  She reports that since starting vytorin, she has been experiencing similar symptoms as simvastatin. Reports that previously on simvastatin, her pain subsided after a while, but w/ vytorin, the pain did not let up. She reports that she has not taken in 2 days and pain has completely resolved. She would like to know if she should increase simvastatin up to 40 mg or if Dr. Rockey Situ would like to switch her to a different type of chol med. Please advise.  Thank you.

## 2013-12-14 NOTE — Telephone Encounter (Signed)
Could try simvastatin 40 mg alternating with 20 mg for a month or so If tolerated, we'll go to simvastatin 40 mg daily

## 2013-12-16 ENCOUNTER — Other Ambulatory Visit: Payer: Self-pay | Admitting: *Deleted

## 2013-12-16 MED ORDER — PANTOPRAZOLE SODIUM 40 MG PO TBEC: 40.0000 mg | DELAYED_RELEASE_TABLET | Freq: Every day | ORAL | Status: DC

## 2013-12-16 NOTE — Telephone Encounter (Signed)
Spoke w/ pt and advised her of Dr. Donivan Scull recommendations and she will call w/ any other questions or concerns.

## 2013-12-16 NOTE — Telephone Encounter (Signed)
Pt requests 90 day supply

## 2013-12-16 NOTE — Telephone Encounter (Signed)
Left message for pt to call back  °

## 2013-12-23 NOTE — Telephone Encounter (Signed)
Pt was suggested to try simvastatin 40 mg alternating with 20 mg for a month or so  If tolerated, we'll go to simvastatin 40 mg daily per Dr. Rockey Situ Telephone note.

## 2014-01-11 ENCOUNTER — Other Ambulatory Visit: Payer: Self-pay | Admitting: Internal Medicine

## 2014-01-18 ENCOUNTER — Other Ambulatory Visit: Payer: Self-pay | Admitting: Internal Medicine

## 2014-01-18 NOTE — Telephone Encounter (Signed)
Ok refill? 

## 2014-03-30 ENCOUNTER — Other Ambulatory Visit: Payer: Self-pay | Admitting: Internal Medicine

## 2014-03-30 NOTE — Telephone Encounter (Signed)
Appt 6/16

## 2014-04-06 ENCOUNTER — Encounter: Payer: Self-pay | Admitting: Internal Medicine

## 2014-04-06 ENCOUNTER — Ambulatory Visit (INDEPENDENT_AMBULATORY_CARE_PROVIDER_SITE_OTHER): Payer: BC Managed Care – PPO | Admitting: Internal Medicine

## 2014-04-06 VITALS — BP 140/68 | HR 69 | Temp 98.0°F | Ht 63.1 in | Wt 150.8 lb

## 2014-04-06 DIAGNOSIS — E039 Hypothyroidism, unspecified: Secondary | ICD-10-CM

## 2014-04-06 DIAGNOSIS — E663 Overweight: Secondary | ICD-10-CM

## 2014-04-06 DIAGNOSIS — IMO0002 Reserved for concepts with insufficient information to code with codable children: Secondary | ICD-10-CM

## 2014-04-06 DIAGNOSIS — Z0000 Encounter for general adult medical examination without abnormal findings: Secondary | ICD-10-CM

## 2014-04-06 DIAGNOSIS — M1710 Unilateral primary osteoarthritis, unspecified knee: Secondary | ICD-10-CM

## 2014-04-06 DIAGNOSIS — E785 Hyperlipidemia, unspecified: Secondary | ICD-10-CM

## 2014-04-06 DIAGNOSIS — Z1239 Encounter for other screening for malignant neoplasm of breast: Secondary | ICD-10-CM

## 2014-04-06 DIAGNOSIS — M949 Disorder of cartilage, unspecified: Secondary | ICD-10-CM

## 2014-04-06 DIAGNOSIS — M8580 Other specified disorders of bone density and structure, unspecified site: Secondary | ICD-10-CM

## 2014-04-06 DIAGNOSIS — Z23 Encounter for immunization: Secondary | ICD-10-CM

## 2014-04-06 DIAGNOSIS — M899 Disorder of bone, unspecified: Secondary | ICD-10-CM

## 2014-04-06 DIAGNOSIS — Z1211 Encounter for screening for malignant neoplasm of colon: Secondary | ICD-10-CM

## 2014-04-06 DIAGNOSIS — I10 Essential (primary) hypertension: Secondary | ICD-10-CM

## 2014-04-06 LAB — MICROALBUMIN / CREATININE URINE RATIO
CREATININE, U: 51.6 mg/dL
Microalb Creat Ratio: 1 mg/g (ref 0.0–30.0)
Microalb, Ur: 0.5 mg/dL (ref 0.0–1.9)

## 2014-04-06 LAB — COMPREHENSIVE METABOLIC PANEL
ALK PHOS: 80 U/L (ref 39–117)
ALT: 31 U/L (ref 0–35)
AST: 31 U/L (ref 0–37)
Albumin: 4.7 g/dL (ref 3.5–5.2)
BILIRUBIN TOTAL: 0.9 mg/dL (ref 0.2–1.2)
BUN: 18 mg/dL (ref 6–23)
CO2: 29 mEq/L (ref 19–32)
CREATININE: 0.8 mg/dL (ref 0.4–1.2)
Calcium: 10.2 mg/dL (ref 8.4–10.5)
Chloride: 101 mEq/L (ref 96–112)
GFR: 73.53 mL/min (ref >60.00)
Glucose, Bld: 94 mg/dL (ref 70–99)
Potassium: 4 mEq/L (ref 3.5–5.1)
Sodium: 137 mEq/L (ref 135–145)
Total Protein: 7.3 g/dL (ref 6.0–8.3)

## 2014-04-06 LAB — LIPID PANEL
CHOL/HDL RATIO: 2
Cholesterol: 157 mg/dL (ref 0–200)
HDL: 65.6 mg/dL (ref >39.00)
LDL CALC: 80 mg/dL (ref 0–99)
NonHDL: 91.4
TRIGLYCERIDES: 56 mg/dL (ref 0.0–149.0)
VLDL: 11.2 mg/dL (ref 0.0–40.0)

## 2014-04-06 LAB — TSH: TSH: 1.23 u[IU]/mL (ref 0.35–4.50)

## 2014-04-06 LAB — VITAMIN D 25 HYDROXY (VIT D DEFICIENCY, FRACTURES): VITD: 31.86 ng/mL

## 2014-04-06 MED ORDER — CELECOXIB 200 MG PO CAPS: 200.0000 mg | ORAL_CAPSULE | Freq: Two times a day (BID) | ORAL | Status: DC

## 2014-04-06 MED ORDER — LEVOTHYROXINE SODIUM 50 MCG PO TABS: 50.0000 ug | ORAL_TABLET | Freq: Every day | ORAL | Status: DC

## 2014-04-06 NOTE — Assessment & Plan Note (Signed)
Wt Readings from Last 3 Encounters:  04/06/14 150 lb 12 oz (68.38 kg)  11/24/13 155 lb 4 oz (70.421 kg)  09/24/13 152 lb (68.947 kg)  Body mass index is 26.61 kg/(m^2).  Encouraged healthy diet and regular physical activity.

## 2014-04-06 NOTE — Progress Notes (Signed)
Pre visit review using our clinic review tool, if applicable. No additional management support is needed unless otherwise documented below in the visit note. 

## 2014-04-06 NOTE — Assessment & Plan Note (Signed)
Will repeat bone density testing. Will check Vit D with labs.

## 2014-04-06 NOTE — Assessment & Plan Note (Signed)
General medical exam normal today including breast exam. PAP and pelvic deferred given pt age and h/o complete hysterectomy, last PAP 2012 normal HPV neg. Encouraged healthy diet and continued regular physical activity. Will check labs including CMP, CBC, lipids, TSH.   Reviewed falls prevention given h/o left knee osteoarthritis. Follow up 6 months and prn.

## 2014-04-06 NOTE — Assessment & Plan Note (Signed)
BP Readings from Last 3 Encounters:  04/06/14 140/68  11/24/13 120/60  09/24/13 130/70   BP well controlled generally. Will continue HCTZ. Check renal function with labs today.

## 2014-04-06 NOTE — Assessment & Plan Note (Signed)
Symptomatically doing well. Continue Levothyroxine. Will check TSH with labs.

## 2014-04-06 NOTE — Progress Notes (Signed)
Subjective:    Patient ID: Mary Pacheco, female    DOB: 24-Jun-1941, 73 y.o.   MRN: [id number]  HPI The patient is here for annual Medicare wellness examination and management of other chronic and acute problems.   The risk factors are reflected in the social history.  The roster of all physicians providing medical care to patient - is listed in the Snapshot section of the chart.  Activities of daily living:  The patient is 100% independent in all ADLs: dressing, toileting, feeding as well as independent mobility. Lives with husband and cat and dog. Single story home.  Home safety : The patient has smoke detectors in the home. They wear seatbelts.  There are no firearms at home. There is no violence in the home.   There is no risks for hepatitis, STDs or HIV. There is a history of blood transfusion at age 79 years old. They have no travel history to infectious disease endemic areas of the world.  The patient has seen their dentist in the last six month. Dentist - Dr. Toy Cookey They have seen their eye doctor in the last year. Dr. Mel Almond in [place] Has hearing aids but does not wear them. They have deferred audiologic testing in the last year.  Plans to have hearing rechecked this year. They do not  have excessive sun exposure. Discussed the need for sun protection: hats, long sleeves and use of sunscreen if there is significant sun exposure.  Dermatologist - Dr. Phillip Heal.  Diet: the importance of a healthy diet is discussed. They do have a healthy diet.  The benefits of regular aerobic exercise were discussed. She exercises 3-5 times per week with stationary bike.  Depression screen: there are no signs or vegative symptoms of depression- irritability, change in appetite, anhedonia, sadness/tearfullness.  Cognitive assessment: the patient manages all their financial and personal affairs and is actively engaged. They could relate day,date,year and events.  The following portions of the  patient's history were reviewed and updated as appropriate: allergies, current medications, past family history, past medical history,  past surgical history, past social history  and problem list.  Visual acuity was not assessed per patient preference since she has regular follow up with her ophthalmologist. Hearing and body mass index were assessed and reviewed.   During the course of the visit the patient was educated and counseled about appropriate screening and preventive services including : fall prevention , diabetes screening, nutrition counseling, colorectal cancer screening, and recommended immunizations.     Review of Systems  Constitutional: Negative for fever, chills, appetite change, fatigue and unexpected weight change.  HENT: Negative for congestion, ear pain, sinus pressure, sore throat, trouble swallowing and voice change.   Eyes: Negative for visual disturbance.  Respiratory: Negative for cough, shortness of breath, wheezing and stridor.   Cardiovascular: Negative for chest pain, palpitations and leg swelling.  Gastrointestinal: Negative for nausea, vomiting, abdominal pain, diarrhea, constipation, blood in stool, abdominal distention and anal bleeding.  Genitourinary: Negative for dysuria and flank pain.  Musculoskeletal: Positive for arthralgias, back pain and myalgias. Negative for gait problem and neck pain.  Skin: Negative for color change and rash.  Neurological: Negative for dizziness and headaches.  Hematological: Negative for adenopathy. Does not bruise/bleed easily.  Psychiatric/Behavioral: Negative for suicidal ideas, sleep disturbance and dysphoric mood. The patient is not nervous/anxious.        Objective:    BP 140/68  Pulse 69  Temp(Src) 98 F (36.7 C) (Oral)  Ht  5' 3.1" (1.603 m)  Wt 150 lb 12 oz (68.38 kg)  BMI 26.61 kg/m2  SpO2 98% Physical Exam  Constitutional: She is oriented to person, place, and time. She appears well-developed and  well-nourished. No distress.  HENT:  Head: Normocephalic and atraumatic.  Right Ear: External ear normal.  Left Ear: External ear normal.  Nose: Nose normal.  Mouth/Throat: Oropharynx is clear and moist. No oropharyngeal exudate.  Eyes: Conjunctivae are normal. Pupils are equal, round, and reactive to light. Right eye exhibits no discharge. Left eye exhibits no discharge. No scleral icterus.  Neck: Normal range of motion. Neck supple. No tracheal deviation present. No thyromegaly present.  Cardiovascular: Normal rate, regular rhythm, normal heart sounds and intact distal pulses.  Exam reveals no gallop and no friction rub.   No murmur heard. Pulmonary/Chest: Effort normal and breath sounds normal. No accessory muscle usage. Not tachypneic. No respiratory distress. She has no decreased breath sounds. She has no wheezes. She has no rales. She exhibits no tenderness. Right breast exhibits no inverted nipple, no mass, no nipple discharge, no skin change and no tenderness. Left breast exhibits no inverted nipple, no mass, no nipple discharge, no skin change and no tenderness. Breasts are symmetrical.  Abdominal: Soft. Bowel sounds are normal. She exhibits no distension and no mass. There is no tenderness. There is no rebound and no guarding.  Musculoskeletal: Normal range of motion. She exhibits no edema and no tenderness.  Lymphadenopathy:    She has no cervical adenopathy.  Neurological: She is alert and oriented to person, place, and time. No cranial nerve deficit. She exhibits normal muscle tone. Coordination normal.  Skin: Skin is warm and dry. No rash noted. She is not diaphoretic. No erythema. No pallor.  Psychiatric: She has a normal mood and affect. Her behavior is normal. Judgment and thought content normal.          Assessment & Plan:   Problem List Items Addressed This Visit     Unprioritized   Hyperlipidemia   Relevant Medications      simvastatin (ZOCOR) 20 MG tablet   Other  Relevant Orders      Lipid panel   Hypertension      BP Readings from Last 3 Encounters:  04/06/14 140/68  11/24/13 120/60  09/24/13 130/70   BP well controlled generally. Will continue HCTZ. Check renal function with labs today.    Relevant Medications      simvastatin (ZOCOR) 20 MG tablet   Other Relevant Orders      Comprehensive metabolic panel      Microalbumin / creatinine urine ratio   Hypothyroidism     Symptomatically doing well. Continue Levothyroxine. Will check TSH with labs.    Relevant Medications      levothyroxine (SYNTHROID, LEVOTHROID) tablet   Other Relevant Orders      TSH   Medicare annual wellness visit, subsequent - Primary     General medical exam normal today including breast exam. PAP and pelvic deferred given pt age and h/o complete hysterectomy, last PAP 2012 normal HPV neg. Encouraged healthy diet and continued regular physical activity. Will check labs including CMP, CBC, lipids, TSH.   Reviewed falls prevention given h/o left knee osteoarthritis. Follow up 6 months and prn.      Osteoarthrosis, unspecified whether generalized or localized, lower leg     Symptoms well controlled with Celebrex. Continue prn Celebrex.    Relevant Medications      celecoxib (CELEBREX) capsule  Osteopenia     Will repeat bone density testing. Will check Vit D with labs.    Relevant Orders      DG Bone Density      Vit D  25 hydroxy (rtn osteoporosis monitoring)   Overweight (BMI 25.0-29.9)      Wt Readings from Last 3 Encounters:  04/06/14 150 lb 12 oz (68.38 kg)  11/24/13 155 lb 4 oz (70.421 kg)  09/24/13 152 lb (68.947 kg)  Body mass index is 26.61 kg/(m^2).  Encouraged healthy diet and regular physical activity.    Screening for breast cancer   Relevant Orders      MM Digital Screening    Other Visit Diagnoses   Screening for colon cancer        Relevant Orders       Ambulatory referral to Gastroenterology    Need for Tdap vaccination         Relevant Orders       Tdap vaccine greater than or equal to 7yo IM (Completed)    Need for prophylactic vaccination against Streptococcus pneumoniae (pneumococcus)        Relevant Orders       Pneumococcal conjugate vaccine 13-valent (Completed)        Return in about 6 months (around 10/06/2014) for Recheck.

## 2014-04-06 NOTE — Assessment & Plan Note (Signed)
Symptoms well controlled with Celebrex. Continue prn Celebrex.

## 2014-04-07 ENCOUNTER — Telehealth: Payer: Self-pay | Admitting: Internal Medicine

## 2014-04-07 NOTE — Telephone Encounter (Signed)
Relevant patient education assigned to patient using Emmi. ° °

## 2014-04-12 LAB — HM MAMMOGRAPHY: HM Mammogram: NEGATIVE

## 2014-04-14 ENCOUNTER — Encounter: Payer: Self-pay | Admitting: *Deleted

## 2014-04-14 LAB — HM DEXA SCAN

## 2014-05-17 DIAGNOSIS — Z1211 Encounter for screening for malignant neoplasm of colon: Secondary | ICD-10-CM | POA: Insufficient documentation

## 2014-05-18 LAB — HM COLONOSCOPY

## 2014-05-20 ENCOUNTER — Encounter: Payer: Self-pay | Admitting: *Deleted

## 2014-05-24 ENCOUNTER — Encounter: Payer: Self-pay | Admitting: Cardiovascular Disease

## 2014-05-24 ENCOUNTER — Ambulatory Visit (INDEPENDENT_AMBULATORY_CARE_PROVIDER_SITE_OTHER): Payer: BC Managed Care – PPO | Admitting: Cardiovascular Disease

## 2014-05-24 VITALS — BP 130/76 | HR 69 | Ht 68.0 in | Wt 152.8 lb

## 2014-05-24 DIAGNOSIS — I10 Essential (primary) hypertension: Secondary | ICD-10-CM

## 2014-05-24 DIAGNOSIS — E663 Overweight: Secondary | ICD-10-CM

## 2014-05-24 DIAGNOSIS — M25562 Pain in left knee: Secondary | ICD-10-CM | POA: Insufficient documentation

## 2014-05-24 DIAGNOSIS — R5383 Other fatigue: Secondary | ICD-10-CM | POA: Insufficient documentation

## 2014-05-24 DIAGNOSIS — R5381 Other malaise: Secondary | ICD-10-CM

## 2014-05-24 DIAGNOSIS — IMO0002 Reserved for concepts with insufficient information to code with codable children: Secondary | ICD-10-CM

## 2014-05-24 DIAGNOSIS — E785 Hyperlipidemia, unspecified: Secondary | ICD-10-CM

## 2014-05-24 DIAGNOSIS — I6529 Occlusion and stenosis of unspecified carotid artery: Secondary | ICD-10-CM

## 2014-05-24 DIAGNOSIS — R5382 Chronic fatigue, unspecified: Secondary | ICD-10-CM

## 2014-05-24 DIAGNOSIS — M1710 Unilateral primary osteoarthritis, unspecified knee: Secondary | ICD-10-CM

## 2014-05-24 DIAGNOSIS — M25561 Pain in right knee: Secondary | ICD-10-CM | POA: Insufficient documentation

## 2014-05-24 DIAGNOSIS — M1712 Unilateral primary osteoarthritis, left knee: Secondary | ICD-10-CM | POA: Insufficient documentation

## 2014-05-24 DIAGNOSIS — M1711 Unilateral primary osteoarthritis, right knee: Secondary | ICD-10-CM

## 2014-05-24 DIAGNOSIS — I6521 Occlusion and stenosis of right carotid artery: Secondary | ICD-10-CM

## 2014-05-24 NOTE — Assessment & Plan Note (Signed)
Blood pressure is well controlled on today's visit. No changes made to the medications. 

## 2014-05-24 NOTE — Assessment & Plan Note (Signed)
That he has been a chronic issue. Suspect poor sleep and she is taking care of her husband, poor sleep hygiene, unable to exclude depression and she seems somewhat overwhelmed taking care of her husband in addition to her own medical issues.

## 2014-05-24 NOTE — Assessment & Plan Note (Signed)
Cholesterol is much better than last year. Encouraged her to stay on as much simvastatin 40 mg as tolerated. She reports having fatigue on higher dose simvastatin. Suspect this is from poor sleep

## 2014-05-24 NOTE — Assessment & Plan Note (Signed)
She seems to have significant symptoms. She is delaying surgery by her report secondary to her husband's medical issues

## 2014-05-24 NOTE — Progress Notes (Signed)
Patient ID: Mary Pacheco, female    DOB: [birth year], 73 y.o.   MRN: [id number]  HPI Comments: 73 year old female with history of hypertension, hyperlipidemia, bilateral carotid arterial disease, worse on the right than the left, estimated at 60-69% on the right, 50% on the left, history of anxiety presenting for routine followup.  Recent lab work showing total cholesterol in the 150 range, LDL 80 Is is an improvement over last years. She is taking occasional episodes of simvastatin 40 mg every third day, alternating with 20 mg She's not doing any regular exercise. She is helping to take care of her husband has significant medical issues including chronic back pain Denies any significant chest pain. She has significant fatigue, poor sleep  She denies any smoking history, no diabetes. She tries to watch what she eats  She does report having history of GI ulcer/bleeding in 2003 which she attributes to NSAIDs which she was taking for arthritis. She recently switched from Nexium to Protonix. She does have some discomfort in her chest in the morning when she wakes up which she attributes to GERD.  EKG shows normal sinus rhythm with rate 69 beats per minute, nonspecific T wave abnormality anterolateral leads   Outpatient Encounter Prescriptions as of 05/24/2014  Medication Sig  . acyclovir (ZOVIRAX) 400 MG tablet TAKE 1 TABLET BY MOUTH DAILY  . ALPRAZolam (XANAX) 0.25 MG tablet Take 1 tablet (0.25 mg total) by mouth 3 (three) times daily as needed for anxiety.  [name] aspirin 81 MG tablet Take 81 mg by mouth daily.    . celecoxib (CELEBREX) 200 MG capsule Take 1 capsule (200 mg total) by mouth 2 (two) times daily.  . cholecalciferol (VITAMIN D) 1000 UNITS tablet Take 1,000 Units by mouth daily.    . Coenzyme Q10 (EQL COQ10) 300 MG CAPS Take by mouth.  . CRANBERRY PO Take by mouth.  . Flaxseed, Linseed, (FLAX SEED OIL PO) Take 1,400 mg by mouth daily.  . hydrochlorothiazide (MICROZIDE) 12.5 MG capsule  Take 1 capsule (12.5 mg total) by mouth daily.  [name] levothyroxine (SYNTHROID) 50 MCG tablet Take 1 tablet (50 mcg total) by mouth daily.  . Misc Natural Products (TOTAL CARDIO HEALTH FORMULA PO) Take 1 tablet by mouth daily.    . Omega-3 Fatty Acids (FISH OIL TRIPLE STRENGTH) 1400 MG CAPS Take by mouth.  . pantoprazole (PROTONIX) 40 MG tablet Take 1 tablet (40 mg total) by mouth daily.  . simvastatin (ZOCOR) 20 MG tablet Alternate 20mg  / 40mg  every other day  . SINGULAIR 10 MG tablet TAKE 1 TABLET AT BEDTIME  . triamcinolone ointment (KENALOG) 0.1 % APPLY THIN FILM TO AFFECTED AREAS BY TOPICAL ROUTE AS DIRECTED FOR 7 DAYS AS NEEDED    Review of Systems  Constitutional: Positive for fatigue.  HENT: Negative.   Eyes: Negative.   Respiratory: Negative.   Cardiovascular: Negative.   Gastrointestinal: Negative.   Endocrine: Negative.   Musculoskeletal: Positive for arthralgias and gait problem.  Skin: Negative.   Allergic/Immunologic: Negative.   Neurological: Negative.   Hematological: Negative.   Psychiatric/Behavioral: Positive for sleep disturbance.  All other systems reviewed and are negative.   BP 130/76  Pulse 69  Ht 5\' 8"  (1.727 m)  Wt 152 lb 12 oz (69.287 kg)  BMI 23.23 kg/m2  Physical Exam  Nursing note and vitals reviewed. Constitutional: She is oriented to person, place, and time. She appears well-developed and well-nourished.  HENT:  Head: Normocephalic.  Nose: Nose normal.  Mouth/Throat: Oropharynx  is clear and moist.  Eyes: Conjunctivae are normal. Pupils are equal, round, and reactive to light.  Neck: Normal range of motion. Neck supple. No JVD present.  Cardiovascular: Normal rate, regular rhythm, S1 normal, S2 normal, normal heart sounds and intact distal pulses.  Exam reveals no gallop and no friction rub.   No murmur heard. Pulmonary/Chest: Effort normal and breath sounds normal. No respiratory distress. She has no wheezes. She has no rales. She exhibits no  tenderness.  Abdominal: Soft. Bowel sounds are normal. She exhibits no distension. There is no tenderness.  Musculoskeletal: Normal range of motion. She exhibits no edema and no tenderness.  Lymphadenopathy:    She has no cervical adenopathy.  Neurological: She is alert and oriented to person, place, and time. Coordination normal.  Skin: Skin is warm and dry. No rash noted. No erythema.  Psychiatric: She has a normal mood and affect. Her behavior is normal. Judgment and thought content normal.    Assessment and Plan

## 2014-05-24 NOTE — Assessment & Plan Note (Signed)
We have encouraged continued exercise, careful diet management in an effort to lose weight. Weight is down 3 pounds from her prior clinic visit

## 2014-05-24 NOTE — Patient Instructions (Signed)
You are doing well. No medication changes were made.  Work on your sleep! Try simvastatin 40 alternating with 20 mg when you are ready  Please call us if you have new issues that need to be addressed before your next appt.  Your physician wants you to follow-up in: 12 months.  You will receive a reminder letter in the mail two months in advance. If you don't receive a letter, please call our office to schedule the follow-up appointment.

## 2014-05-24 NOTE — Assessment & Plan Note (Signed)
Repeat ultrasound scheduled later this fall. Continue aggressive cholesterol management

## 2014-05-31 ENCOUNTER — Other Ambulatory Visit: Payer: Self-pay | Admitting: Internal Medicine

## 2014-06-01 ENCOUNTER — Other Ambulatory Visit: Payer: Self-pay | Admitting: Internal Medicine

## 2014-06-05 ENCOUNTER — Other Ambulatory Visit: Payer: Self-pay | Admitting: Internal Medicine

## 2014-06-17 ENCOUNTER — Encounter: Payer: Self-pay | Admitting: Internal Medicine

## 2014-06-25 ENCOUNTER — Encounter: Payer: Self-pay | Admitting: Internal Medicine

## 2014-06-25 ENCOUNTER — Ambulatory Visit (INDEPENDENT_AMBULATORY_CARE_PROVIDER_SITE_OTHER): Payer: BC Managed Care – PPO | Admitting: Internal Medicine

## 2014-06-25 VITALS — BP 160/62 | HR 72 | Temp 98.1°F | Ht 63.1 in | Wt 152.8 lb

## 2014-06-25 DIAGNOSIS — N760 Acute vaginitis: Secondary | ICD-10-CM | POA: Insufficient documentation

## 2014-06-25 LAB — POCT URINALYSIS DIPSTICK
BILIRUBIN UA: NEGATIVE
GLUCOSE UA: NEGATIVE
KETONES UA: NEGATIVE
Nitrite, UA: NEGATIVE
Protein, UA: NEGATIVE
Urobilinogen, UA: 0.2
pH, UA: 5.5

## 2014-06-25 NOTE — Progress Notes (Signed)
Pre visit review using our clinic review tool, if applicable. No additional management support is needed unless otherwise documented below in the visit note. 

## 2014-06-25 NOTE — Progress Notes (Signed)
   Subjective:    Patient ID: Mary Pacheco, female    DOB: 02-29-1941, 73 y.o.   MRN: [id number]  HPI 73YO female presents for acute visit.  Vaginal discharge - 2 weeks ago, had some brownish discharge. No bright red blood. Mild odor. Initially had some lower back pain. Discharge and pain have resolved. No urinary symptoms. Felt "sensitive" externally. No itching. Not using any topical preparations. S/p hysterectomy.  Review of Systems  Constitutional: Negative for fever, chills, appetite change, fatigue and unexpected weight change.  Eyes: Negative for visual disturbance.  Respiratory: Negative for shortness of breath.   Cardiovascular: Negative for chest pain and leg swelling.  Gastrointestinal: Negative for nausea, vomiting, abdominal pain, diarrhea, constipation and blood in stool.  Genitourinary: Positive for vaginal discharge and vaginal pain. Negative for urgency, frequency, hematuria, decreased urine volume, vaginal bleeding, genital sores and pelvic pain.  Skin: Negative for color change and rash.  Hematological: Negative for adenopathy. Does not bruise/bleed easily.  Psychiatric/Behavioral: Negative for dysphoric mood. The patient is not nervous/anxious.        Objective:    BP 160/62  Pulse 72  Temp(Src) 98.1 F (36.7 C) (Oral)  Ht 5' 3.1" (1.603 m)  Wt 152 lb 12 oz (69.287 kg)  BMI 26.96 kg/m2  SpO2 98% Physical Exam  Constitutional: She is oriented to person, place, and time. She appears well-developed and well-nourished. No distress.  HENT:  Head: Normocephalic and atraumatic.  Right Ear: External ear normal.  Left Ear: External ear normal.  Nose: Nose normal.  Mouth/Throat: Oropharynx is clear and moist.  Eyes: Conjunctivae are normal. Pupils are equal, round, and reactive to light. Right eye exhibits no discharge. Left eye exhibits no discharge. No scleral icterus.  Neck: Normal range of motion. Neck supple. No tracheal deviation present. No thyromegaly  present.  Pulmonary/Chest: Effort normal. No accessory muscle usage. Not tachypneic. She has no decreased breath sounds. She has no rhonchi.  Genitourinary: There is no rash, tenderness, lesion or injury on the right labia. There is no rash, tenderness, lesion or injury on the left labia. There is erythema (consistent with atrophy, tissue friable) around the vagina. No tenderness or bleeding around the vagina. No signs of injury around the vagina. No vaginal discharge found.  Uterus surgically absent. No palpable masses or other deformities. No TTP.  Musculoskeletal: Normal range of motion. She exhibits no edema and no tenderness.  Lymphadenopathy:    She has no cervical adenopathy.  Neurological: She is alert and oriented to person, place, and time. No cranial nerve deficit. She exhibits normal muscle tone. Coordination normal.  Skin: Skin is warm and dry. No rash noted. She is not diaphoretic. No erythema. No pallor.  Psychiatric: She has a normal mood and affect. Her behavior is normal. Judgment and thought content normal.          Assessment & Plan:   Problem List Items Addressed This Visit     Unprioritized   Vaginitis and vulvovaginitis - Primary     Symptoms and exam most consistent with atrophic vaginitis. Encouraged use of silicone based lubricants. Vaginal culture sent today. Follow up prn.    Relevant Orders      POCT Urinalysis Dipstick (Completed)      Cervical culture      CULTURE, URINE COMPREHENSIVE       Return if symptoms worsen or fail to improve.

## 2014-06-25 NOTE — Assessment & Plan Note (Signed)
Symptoms and exam most consistent with atrophic vaginitis. Encouraged use of silicone based lubricants. Vaginal culture sent today. Follow up prn.

## 2014-06-25 NOTE — Patient Instructions (Signed)
We will send vaginal culture and call you with results.  Consider using vaginal lubricant such as Astroglide.

## 2014-06-27 LAB — CULTURE, URINE COMPREHENSIVE
Colony Count: NO GROWTH
Organism ID, Bacteria: NO GROWTH

## 2014-07-02 LAB — GENITAL CULTURE

## 2014-07-02 MED ORDER — CIPROFLOXACIN HCL 500 MG PO TABS: 500.0000 mg | ORAL_TABLET | Freq: Two times a day (BID) | ORAL | Status: DC

## 2014-07-02 NOTE — Addendum Note (Signed)
Addended by: Ronette Deter A on: 07/02/2014 04:54 PM   Modules accepted: Orders

## 2014-07-12 ENCOUNTER — Other Ambulatory Visit: Payer: Self-pay | Admitting: Internal Medicine

## 2014-08-07 ENCOUNTER — Encounter: Payer: Self-pay | Admitting: Internal Medicine

## 2014-10-06 ENCOUNTER — Ambulatory Visit (INDEPENDENT_AMBULATORY_CARE_PROVIDER_SITE_OTHER): Payer: BC Managed Care – PPO | Admitting: Internal Medicine

## 2014-10-06 ENCOUNTER — Encounter: Payer: Self-pay | Admitting: Internal Medicine

## 2014-10-06 VITALS — BP 135/71 | HR 69 | Temp 98.1°F | Ht 63.1 in | Wt 153.2 lb

## 2014-10-06 DIAGNOSIS — R519 Headache, unspecified: Secondary | ICD-10-CM

## 2014-10-06 DIAGNOSIS — R51 Headache: Secondary | ICD-10-CM

## 2014-10-06 DIAGNOSIS — F419 Anxiety disorder, unspecified: Secondary | ICD-10-CM

## 2014-10-06 DIAGNOSIS — E038 Other specified hypothyroidism: Secondary | ICD-10-CM

## 2014-10-06 DIAGNOSIS — I10 Essential (primary) hypertension: Secondary | ICD-10-CM

## 2014-10-06 DIAGNOSIS — E785 Hyperlipidemia, unspecified: Secondary | ICD-10-CM

## 2014-10-06 DIAGNOSIS — K219 Gastro-esophageal reflux disease without esophagitis: Secondary | ICD-10-CM

## 2014-10-06 LAB — COMPREHENSIVE METABOLIC PANEL
ALK PHOS: 79 U/L (ref 39–117)
ALT: 35 U/L (ref 0–35)
AST: 32 U/L (ref 0–37)
Albumin: 4.6 g/dL (ref 3.5–5.2)
BUN: 16 mg/dL (ref 6–23)
CHLORIDE: 105 meq/L (ref 96–112)
CO2: 28 meq/L (ref 19–32)
Calcium: 9.6 mg/dL (ref 8.4–10.5)
Creatinine, Ser: 0.9 mg/dL (ref 0.4–1.2)
GFR: 67.61 mL/min (ref >60.00)
GLUCOSE: 108 mg/dL — AB (ref 70–99)
Potassium: 4.3 mEq/L (ref 3.5–5.1)
SODIUM: 137 meq/L (ref 135–145)
TOTAL PROTEIN: 7.4 g/dL (ref 6.0–8.3)
Total Bilirubin: 0.7 mg/dL (ref 0.2–1.2)

## 2014-10-06 LAB — LIPID PANEL
Cholesterol: 187 mg/dL (ref 0–200)
HDL: 56.6 mg/dL (ref >39.00)
LDL Cholesterol: 109 mg/dL — ABNORMAL HIGH (ref 0–99)
NONHDL: 130.4
Total CHOL/HDL Ratio: 3
Triglycerides: 108 mg/dL (ref 0.0–149.0)
VLDL: 21.6 mg/dL (ref 0.0–40.0)

## 2014-10-06 LAB — SEDIMENTATION RATE: Sed Rate: 9 mm/hr (ref 0–22)

## 2014-10-06 LAB — CK: Total CK: 164 U/L (ref 7–177)

## 2014-10-06 MED ORDER — ALPRAZOLAM 0.25 MG PO TABS: 0.2500 mg | ORAL_TABLET | Freq: Three times a day (TID) | ORAL | Status: DC | PRN

## 2014-10-06 MED ORDER — ROSUVASTATIN CALCIUM 5 MG PO TABS: 5.0000 mg | ORAL_TABLET | Freq: Every day | ORAL | Status: DC

## 2014-10-06 MED ORDER — MONTELUKAST SODIUM 10 MG PO TABS: 10.0000 mg | ORAL_TABLET | Freq: Every day | ORAL | Status: DC

## 2014-10-06 MED ORDER — TRIAMCINOLONE ACETONIDE 0.1 % EX OINT: TOPICAL_OINTMENT | CUTANEOUS | Status: DC

## 2014-10-06 MED ORDER — HYDROCHLOROTHIAZIDE 12.5 MG PO CAPS: 12.5000 mg | ORAL_CAPSULE | Freq: Every day | ORAL | Status: DC

## 2014-10-06 NOTE — Assessment & Plan Note (Signed)
BP Readings from Last 3 Encounters:  10/06/14 135/71  06/25/14 160/62  05/24/14 130/76   BP well controlled on current medication. Renal function with labs today.

## 2014-10-06 NOTE — Patient Instructions (Signed)
Stop Simvastatin.  Start Crestor 5mg  daily.  We will recheck cholesterol in 37months.

## 2014-10-06 NOTE — Assessment & Plan Note (Signed)
Symptoms well controlled with prn alprazolam. Will continue.

## 2014-10-06 NOTE — Assessment & Plan Note (Signed)
GERD symptoms worsened with eating spicy foods at night. Encouraged her to avoid spicy foods. Will have her take Pantoprazole with dinner. Follow up if symptoms are not improving.

## 2014-10-06 NOTE — Assessment & Plan Note (Signed)
Symptomatically doing well. Will continue Levothyroxine.

## 2014-10-06 NOTE — Progress Notes (Signed)
Pre visit review using our clinic review tool, if applicable. No additional management support is needed unless otherwise documented below in the visit note. 

## 2014-10-06 NOTE — Assessment & Plan Note (Signed)
Will stop Simvastatin and try Crestor. Check LFTs and CK with labs today. Repeat lipids in 3 months.

## 2014-10-06 NOTE — Progress Notes (Signed)
Subjective:    Patient ID: Mary Pacheco, female    DOB: Aug 28, 1941, 72 y.o.   MRN: [id number]  HPI 73YO female presents for follow up.  Continues to have aching in bilateral arms with use of statin. Has tried decreasing amount of statin medication to qod with no improvement, however symptoms stop if she stops the medication.  Also concerned about increased reflux symptoms in the morning when she wakes up. Takes Pantoprazole in the mornings. Notes she eats some foods at night which make reflux worse.  Also concerned about right sided facial pain which occurs intermittently. Last episode 2 weeks ago. Has been evaluated by ENT and vascular with no etiology determined. MRI reportedly was normal. Pain described as sharp, radiates back across head. Lasts only a few minutes. Does not take anything for this.   Past medical, surgical, family and social history per today's encounter.  Review of Systems  Constitutional: Negative for fever, chills, appetite change, fatigue and unexpected weight change.  HENT: Negative for congestion, sinus pressure, sore throat, trouble swallowing and voice change.   Eyes: Negative for visual disturbance.  Respiratory: Negative for shortness of breath.   Cardiovascular: Negative for chest pain and leg swelling.  Gastrointestinal: Positive for abdominal pain (epigatsric with reflux in the mornings). Negative for nausea, vomiting, diarrhea, constipation and blood in stool.  Musculoskeletal: Positive for myalgias. Negative for arthralgias and gait problem.  Skin: Negative for color change and rash.  Hematological: Negative for adenopathy. Does not bruise/bleed easily.  Psychiatric/Behavioral: Negative for dysphoric mood. The patient is not nervous/anxious.        Objective:    BP 135/71 mmHg  Pulse 69  Temp(Src) 98.1 F (36.7 C) (Oral)  Ht 5' 3.1" (1.603 m)  Wt 153 lb 4 oz (69.514 kg)  BMI 27.05 kg/m2  SpO2 95% Physical Exam  Constitutional: She is  oriented to person, place, and time. She appears well-developed and well-nourished. No distress.  HENT:  Head: Normocephalic and atraumatic.  Right Ear: External ear normal.  Left Ear: External ear normal.  Nose: Nose normal.  Mouth/Throat: Oropharynx is clear and moist. No oropharyngeal exudate.  Eyes: Conjunctivae are normal. Pupils are equal, round, and reactive to light. Right eye exhibits no discharge. Left eye exhibits no discharge. No scleral icterus.  Neck: Normal range of motion. Neck supple. No tracheal deviation present. No thyromegaly present.  Cardiovascular: Normal rate, regular rhythm, normal heart sounds and intact distal pulses.  Exam reveals no gallop and no friction rub.   No murmur heard. Pulmonary/Chest: Effort normal and breath sounds normal. No accessory muscle usage. No tachypnea. No respiratory distress. She has no decreased breath sounds. She has no wheezes. She has no rhonchi. She has no rales. She exhibits no tenderness.  Abdominal: Soft. Bowel sounds are normal. She exhibits no distension and no mass. There is no tenderness. There is no rebound and no guarding.  Musculoskeletal: Normal range of motion. She exhibits no edema or tenderness.  Lymphadenopathy:    She has no cervical adenopathy.  Neurological: She is alert and oriented to person, place, and time. No cranial nerve deficit. She exhibits normal muscle tone. Coordination normal.  Skin: Skin is warm and dry. No rash noted. She is not diaphoretic. No erythema. No pallor.  Psychiatric: She has a normal mood and affect. Her behavior is normal. Judgment and thought content normal.          Assessment & Plan:   Problem List Items Addressed This Visit  Unprioritized   Anxiety    Symptoms well controlled with prn alprazolam. Will continue.    Relevant Medications      ALPRAZolam  Duanne Moron) tablet   GERD (gastroesophageal reflux disease)    GERD symptoms worsened with eating spicy foods at night.  Encouraged her to avoid spicy foods. Will have her take Pantoprazole with dinner. Follow up if symptoms are not improving.    Hyperlipidemia    Will stop Simvastatin and try Crestor. Check LFTs and CK with labs today. Repeat lipids in 3 months.    Relevant Medications      rosuvastatin (CRESTOR) tablet      hydrochlorothiazide (MICROZIDE) 12.5 MG capsule   Other Relevant Orders      Comprehensive metabolic panel      CK (Creatine Kinase)      Lipid panel   Hypertension - Primary    BP Readings from Last 3 Encounters:  10/06/14 135/71  06/25/14 160/62  05/24/14 130/76   BP well controlled on current medication. Renal function with labs today.    Relevant Medications      rosuvastatin (CRESTOR) tablet      hydrochlorothiazide (MICROZIDE) 12.5 MG capsule   Hypothyroidism    Symptomatically doing well. Will continue Levothyroxine.    Right facial pain    Will request MRI ordered by ENT. Will check ESR to screen for GCA. Exam today is normal.    Relevant Orders      Sed Rate (ESR)       Return in about 3 months (around 01/05/2015) for Recheck Cholesterol.

## 2014-10-06 NOTE — Assessment & Plan Note (Signed)
Will request MRI ordered by ENT. Will check ESR to screen for GCA. Exam today is normal.

## 2014-10-07 ENCOUNTER — Other Ambulatory Visit: Payer: Self-pay | Admitting: *Deleted

## 2014-10-07 DIAGNOSIS — E785 Hyperlipidemia, unspecified: Secondary | ICD-10-CM

## 2014-10-07 MED ORDER — ROSUVASTATIN CALCIUM 5 MG PO TABS: 5.0000 mg | ORAL_TABLET | Freq: Every day | ORAL | Status: DC

## 2014-10-13 ENCOUNTER — Other Ambulatory Visit: Payer: Self-pay | Admitting: *Deleted

## 2014-10-25 ENCOUNTER — Telehealth: Payer: Self-pay | Admitting: *Deleted

## 2014-10-25 MED ORDER — SINGULAIR 10 MG PO TABS: 10.0000 mg | ORAL_TABLET | Freq: Every day | ORAL | Status: DC

## 2014-10-25 NOTE — Telephone Encounter (Signed)
Pt called in, requesting [facility]. Rx sent to pharmacy by [facility]

## 2014-11-22 ENCOUNTER — Other Ambulatory Visit: Payer: Self-pay | Admitting: Internal Medicine

## 2014-11-29 ENCOUNTER — Ambulatory Visit: Payer: Self-pay

## 2014-11-29 ENCOUNTER — Telehealth: Payer: Self-pay | Admitting: Internal Medicine

## 2014-11-29 NOTE — Telephone Encounter (Signed)
[facility] Patient Name: Mary Pacheco DOB: [birth year] Initial Comment Caller states, dizziness since late Friday night, room is spinning, Nurse Assessment Nurse: Ronnald Ramp, RN, Miranda Date/Time (Eastern Time): 11/29/2014 9:01:15 AM Confirm and document reason for call. If symptomatic, describe symptoms. ---Caller states she started having dizziness on Friday night. Felt like things were spinning that increased when laying down on her side. Continued with dizziness. Has the patient traveled out of the country within the last 30 days? ---Not Applicable Does the patient require triage? ---Yes Related visit to physician within the last 2 weeks? ---No Does the PT have any chronic conditions? (i.e. diabetes, asthma, etc.) ---Yes List chronic conditions. ---Vertigo, High Cholesterol, Thyroid, Allergies Guidelines Guideline Title Affirmed Question Affirmed Notes Dizziness - Vertigo [1] MODERATE dizziness (e.g., feels very unsteady, interferes with normal activities) AND [2] has been evaluated by physician for this Final Disposition User See PCP When Office is Open (within 3 days) Ronnald Ramp, RN, [employer] offered appointment but declined. She is going to go to [facility] in [place] today.

## 2015-01-05 ENCOUNTER — Encounter: Payer: Self-pay | Admitting: Internal Medicine

## 2015-01-05 ENCOUNTER — Ambulatory Visit (INDEPENDENT_AMBULATORY_CARE_PROVIDER_SITE_OTHER): Payer: Medicare Other | Admitting: Internal Medicine

## 2015-01-05 VITALS — BP 152/72 | HR 72 | Temp 97.9°F | Ht 63.1 in | Wt 149.4 lb

## 2015-01-05 DIAGNOSIS — R51 Headache: Secondary | ICD-10-CM

## 2015-01-05 DIAGNOSIS — R519 Headache, unspecified: Secondary | ICD-10-CM

## 2015-01-05 DIAGNOSIS — J309 Allergic rhinitis, unspecified: Secondary | ICD-10-CM

## 2015-01-05 DIAGNOSIS — I10 Essential (primary) hypertension: Secondary | ICD-10-CM

## 2015-01-05 DIAGNOSIS — E785 Hyperlipidemia, unspecified: Secondary | ICD-10-CM

## 2015-01-05 LAB — COMPREHENSIVE METABOLIC PANEL
ALT: 26 U/L (ref 0–35)
AST: 25 U/L (ref 0–37)
Albumin: 4.9 g/dL (ref 3.5–5.2)
Alkaline Phosphatase: 86 U/L (ref 39–117)
BILIRUBIN TOTAL: 0.7 mg/dL (ref 0.2–1.2)
BUN: 17 mg/dL (ref 6–23)
CALCIUM: 10 mg/dL (ref 8.4–10.5)
CO2: 32 meq/L (ref 19–32)
CREATININE: 0.88 mg/dL (ref 0.40–1.20)
Chloride: 102 mEq/L (ref 96–112)
GFR: 66.68 mL/min (ref >60.00)
GLUCOSE: 99 mg/dL (ref 70–99)
Potassium: 4.5 mEq/L (ref 3.5–5.1)
Sodium: 138 mEq/L (ref 135–145)
Total Protein: 7 g/dL (ref 6.0–8.3)

## 2015-01-05 LAB — LIPID PANEL
CHOLESTEROL: 157 mg/dL (ref 0–200)
HDL: 61.1 mg/dL (ref >39.00)
LDL Cholesterol: 75 mg/dL (ref 0–99)
NonHDL: 95.9
TRIGLYCERIDES: 107 mg/dL (ref 0.0–149.0)
Total CHOL/HDL Ratio: 3
VLDL: 21.4 mg/dL (ref 0.0–40.0)

## 2015-01-05 MED ORDER — ACYCLOVIR 400 MG PO TABS: 400.0000 mg | ORAL_TABLET | Freq: Every day | ORAL | Status: DC

## 2015-01-05 MED ORDER — BECLOMETHASONE DIPROPIONATE 80 MCG/ACT NA AERS: 1.0000 | INHALATION_SPRAY | Freq: Every day | NASAL | Status: DC

## 2015-01-05 NOTE — Patient Instructions (Addendum)
Labs today.  Start Qnasl spray to help with allergy symptoms.  Follow up in 3 months for Wellness Visit.

## 2015-01-05 NOTE — Assessment & Plan Note (Signed)
Will check LFTs and lipids, on Crestor.

## 2015-01-05 NOTE — Progress Notes (Signed)
Subjective:    Patient ID: Mary Pacheco, female    DOB: 23-Jan-1941, 74 y.o.   MRN: [id number]  HPI  74YO female presents for follow up.  Last seen 09/2014. Concern about right facial pain at that time. Referral made to ENT. Also changed from Simvastatin to Crestor.  Continues to have right facial pain and decreased hearing bilaterally. Feels off balance at times with occasional vertigo. Feels that hearing has gotten progressively worse. Wears hearing aids occasionally, but not every day.  HL - Tolerating Crestor well. No myalgia.  Not feeling well today. Has mild diffuse headache. Attributes to not eating well this morning prior to visit.  Past medical, surgical, family and social history per today's encounter.  Review of Systems  Constitutional: Negative for fever, chills, appetite change, fatigue and unexpected weight change.  HENT: Positive for congestion, ear pain, hearing loss, postnasal drip, rhinorrhea and sneezing. Negative for ear discharge and sinus pressure.   Eyes: Negative for visual disturbance.  Respiratory: Negative for shortness of breath.   Cardiovascular: Negative for chest pain and leg swelling.  Gastrointestinal: Negative for abdominal pain.  Musculoskeletal: Negative for myalgias and arthralgias.  Skin: Negative for color change and rash.  Neurological: Positive for dizziness, light-headedness and headaches. Negative for tremors, seizures, syncope, speech difficulty, weakness and numbness.  Hematological: Negative for adenopathy. Does not bruise/bleed easily.  Psychiatric/Behavioral: Negative for sleep disturbance and dysphoric mood. The patient is not nervous/anxious.        Objective:    BP 152/72 mmHg  Pulse 72  Temp(Src) 97.9 F (36.6 C) (Oral)  Ht 5' 3.1" (1.603 m)  Wt 149 lb 6 oz (67.756 kg)  BMI 26.37 kg/m2  SpO2 97% Physical Exam  Constitutional: She is oriented to person, place, and time. She appears well-developed and well-nourished. No  distress.  HENT:  Head: Normocephalic and atraumatic.  Right Ear: Tympanic membrane and external ear normal.  Left Ear: Tympanic membrane and external ear normal.  Nose: Nose normal. No mucosal edema.  Mouth/Throat: Oropharynx is clear and moist. No oropharyngeal exudate.  Eyes: Conjunctivae are normal. Pupils are equal, round, and reactive to light. Right eye exhibits no discharge. Left eye exhibits no discharge. No scleral icterus.  Neck: Normal range of motion. Neck supple. No tracheal deviation present. No thyromegaly present.  Cardiovascular: Normal rate, regular rhythm, normal heart sounds and intact distal pulses.  Exam reveals no gallop and no friction rub.   No murmur heard. Pulmonary/Chest: Effort normal and breath sounds normal. No respiratory distress. She has no wheezes. She has no rales. She exhibits no tenderness.  Musculoskeletal: Normal range of motion. She exhibits no edema or tenderness.  Lymphadenopathy:    She has no cervical adenopathy.  Neurological: She is alert and oriented to person, place, and time. No cranial nerve deficit. She exhibits normal muscle tone. Coordination normal.  Skin: Skin is warm and dry. No rash noted. She is not diaphoretic. No erythema. No pallor.  Psychiatric: She has a normal mood and affect. Her behavior is normal. Judgment and thought content normal.          Assessment & Plan:   Problem List Items Addressed This Visit      Unprioritized   Hyperlipidemia    Will check LFTs and lipids, on Crestor.       Relevant Orders   Comprehensive metabolic panel   Lipid panel   Hypertension - Primary    BP Readings from Last 3 Encounters:  01/05/15 152/72  10/06/14 135/71  06/25/14 160/62   BP elevated today. Will continue current medication, HCTZ. Check renal function with labs. Repeat check in 3 months.      Rhinitis, allergic    Symptoms of allergic rhinitis. No improvement in past with non-sedating antihistamines. No improvement  with Singulair. Will try adding Qnasl. Discussed potential side effects from this medication. Follow up with Dr. Kathyrn Pacheco as well.      Relevant Medications   Beclomethasone Dipropionate (QNASL) 80 MCG/ACT AERS   Right facial pain    Right facial pain and bilateral ear pain ongoing for months. Exam is normal. Lab evaluation with ESR to look for GCA was normal. Will set up ENT evaluation with Dr. Kathyrn Pacheco.      Relevant Orders   Ambulatory referral to ENT       Return in about 3 months (around 04/07/2015) for Wellness Visit.

## 2015-01-05 NOTE — Assessment & Plan Note (Signed)
Symptoms of allergic rhinitis. No improvement in past with non-sedating antihistamines. No improvement with Singulair. Will try adding Qnasl. Discussed potential side effects from this medication. Follow up with Dr. Kathyrn Sheriff as well.

## 2015-01-05 NOTE — Assessment & Plan Note (Signed)
Right facial pain and bilateral ear pain ongoing for months. Exam is normal. Lab evaluation with ESR to look for GCA was normal. Will set up ENT evaluation with Dr. Kathyrn Sheriff.

## 2015-01-05 NOTE — Assessment & Plan Note (Signed)
BP Readings from Last 3 Encounters:  01/05/15 152/72  10/06/14 135/71  06/25/14 160/62   BP elevated today. Will continue current medication, HCTZ. Check renal function with labs. Repeat check in 3 months.

## 2015-01-05 NOTE — Progress Notes (Signed)
Pre visit review using our clinic review tool, if applicable. No additional management support is needed unless otherwise documented below in the visit note. 

## 2015-03-09 ENCOUNTER — Ambulatory Visit (INDEPENDENT_AMBULATORY_CARE_PROVIDER_SITE_OTHER): Payer: Medicare Other

## 2015-03-09 VITALS — BP 132/68 | HR 75 | Temp 98.1°F | Resp 14 | Ht 63.1 in | Wt 155.5 lb

## 2015-03-09 DIAGNOSIS — Z0000 Encounter for general adult medical examination without abnormal findings: Secondary | ICD-10-CM | POA: Diagnosis not present

## 2015-03-09 NOTE — Progress Notes (Signed)
Annual Wellness Visit as completed by Health Coach was reviewed in full.  

## 2015-03-09 NOTE — Patient Instructions (Addendum)
[name] , Thank you for taking time to come for your Medicare Wellness Visit. I appreciate your ongoing commitment to your health goals. Please review the following plan we discussed and let me know if I can assist you in the future.   These are the goals we discussed: Goals    . Increase physical activity       This is a list of the screening recommended for you and due dates:  Health Maintenance  Topic Date Due  . Flu Shot  05/23/2015  . Mammogram  04/12/2016  . Tetanus Vaccine  04/06/2024  . Colon Cancer Screening  05/18/2024  . DEXA scan (bone density measurement)  Completed  . Shingles Vaccine  Completed    Health Maintenance Adopting a healthy lifestyle and getting preventive care can go a long way to promote health and wellness. Talk with your health care provider about what schedule of regular examinations is right for you. This is a good chance for you to check in with your provider about disease prevention and staying healthy. In between checkups, there are plenty of things you can do on your own. Experts have done a lot of research about which lifestyle changes and preventive measures are most likely to keep you healthy. Ask your health care provider for more information. WEIGHT AND DIET  Eat a healthy diet  Be sure to include plenty of vegetables, fruits, low-fat dairy products, and lean protein.  Do not eat a lot of foods high in solid fats, added sugars, or salt.  Get regular exercise. This is one of the most important things you can do for your health.  Most adults should exercise for at least 150 minutes each week. The exercise should increase your heart rate and make you sweat (moderate-intensity exercise).  Most adults should also do strengthening exercises at least twice a week. This is in addition to the moderate-intensity exercise.  Maintain a healthy weight  Body mass index (BMI) is a measurement that can be used to identify possible weight problems. It  estimates body fat based on height and weight. Your health care provider can help determine your BMI and help you achieve or maintain a healthy weight.  For females 16 years of age and older:   A BMI below 18.5 is considered underweight.  A BMI of 18.5 to 24.9 is normal.  A BMI of 25 to 29.9 is considered overweight.  A BMI of 30 and above is considered obese.  Watch levels of cholesterol and blood lipids  You should start having your blood tested for lipids and cholesterol at 75 years of age, then have this test every 5 years.  You may need to have your cholesterol levels checked more often if:  Your lipid or cholesterol levels are high.  You are older than 75 years of age.  You are at high risk for heart disease.  CANCER SCREENING   Lung Cancer  Lung cancer screening is recommended for adults 28-48 years old who are at high risk for lung cancer because of a history of smoking.  A yearly low-dose CT scan of the lungs is recommended for people who:  Currently smoke.  Have quit within the past 15 years.  Have at least a 30-pack-year history of smoking. A pack year is smoking an average of one pack of cigarettes a day for 1 year.  Yearly screening should continue until it has been 15 years since you quit.  Yearly screening should stop if you  develop a health problem that would prevent you from having lung cancer treatment.  Breast Cancer  Practice breast self-awareness. This means understanding how your breasts normally appear and feel.  It also means doing regular breast self-exams. Let your health care provider know about any changes, no matter how small.  If you are in your 20s or 30s, you should have a clinical breast exam (CBE) by a health care provider every 1-3 years as part of a regular health exam.  If you are 59 or older, have a CBE every year. Also consider having a breast X-ray (mammogram) every year.  If you have a family history of breast cancer, talk  to your health care provider about genetic screening.  If you are at high risk for breast cancer, talk to your health care provider about having an MRI and a mammogram every year.  Breast cancer gene (BRCA) assessment is recommended for women who have family members with BRCA-related cancers. BRCA-related cancers include:  Breast.  Ovarian.  Tubal.  Peritoneal cancers.  Results of the assessment will determine the need for genetic counseling and BRCA1 and BRCA2 testing. Cervical Cancer Routine pelvic examinations to screen for cervical cancer are no longer recommended for nonpregnant women who are considered low risk for cancer of the pelvic organs (ovaries, uterus, and vagina) and who do not have symptoms. A pelvic examination may be necessary if you have symptoms including those associated with pelvic infections. Ask your health care provider if a screening pelvic exam is right for you.   The Pap test is the screening test for cervical cancer for women who are considered at risk.  If you had a hysterectomy for a problem that was not cancer or a condition that could lead to cancer, then you no longer need Pap tests.  If you are older than 65 years, and you have had normal Pap tests for the past 10 years, you no longer need to have Pap tests.  If you have had past treatment for cervical cancer or a condition that could lead to cancer, you need Pap tests and screening for cancer for at least 20 years after your treatment.  If you no longer get a Pap test, assess your risk factors if they change (such as having a new sexual partner). This can affect whether you should start being screened again.  Some women have medical problems that increase their chance of getting cervical cancer. If this is the case for you, your health care provider may recommend more frequent screening and Pap tests.  The human papillomavirus (HPV) test is another test that may be used for cervical cancer screening.  The HPV test looks for the virus that can cause cell changes in the cervix. The cells collected during the Pap test can be tested for HPV.  The HPV test can be used to screen women 58 years of age and older. Getting tested for HPV can extend the interval between normal Pap tests from three to five years.  An HPV test also should be used to screen women of any age who have unclear Pap test results.  After 75 years of age, women should have HPV testing as often as Pap tests.  Colorectal Cancer  This type of cancer can be detected and often prevented.  Routine colorectal cancer screening usually begins at 75 years of age and continues through 75 years of age.  Your health care provider may recommend screening at an earlier age if you have  risk factors for colon cancer.  Your health care provider may also recommend using home test kits to check for hidden blood in the stool.  A small camera at the end of a tube can be used to examine your colon directly (sigmoidoscopy or colonoscopy). This is done to check for the earliest forms of colorectal cancer.  Routine screening usually begins at age 2.  Direct examination of the colon should be repeated every 5-10 years through 75 years of age. However, you may need to be screened more often if early forms of precancerous polyps or small growths are found. Skin Cancer  Check your skin from head to toe regularly.  Tell your health care provider about any new moles or changes in moles, especially if there is a change in a mole's shape or color.  Also tell your health care provider if you have a mole that is larger than the size of a pencil eraser.  Always use sunscreen. Apply sunscreen liberally and repeatedly throughout the day.  Protect yourself by wearing long sleeves, pants, a wide-brimmed hat, and sunglasses whenever you are outside. HEART DISEASE, DIABETES, AND HIGH BLOOD PRESSURE   Have your blood pressure checked at least every 1-2  years. High blood pressure causes heart disease and increases the risk of stroke.  If you are between 20 years and 74 years old, ask your health care provider if you should take aspirin to prevent strokes.  Have regular diabetes screenings. This involves taking a blood sample to check your fasting blood sugar level.  If you are at a normal weight and have a low risk for diabetes, have this test once every three years after 75 years of age.  If you are overweight and have a high risk for diabetes, consider being tested at a younger age or more often. PREVENTING INFECTION  Hepatitis B  If you have a higher risk for hepatitis B, you should be screened for this virus. You are considered at high risk for hepatitis B if:  You were born in a country where hepatitis B is common. Ask your health care provider which countries are considered high risk.  Your parents were born in a high-risk country, and you have not been immunized against hepatitis B (hepatitis B vaccine).  You have HIV or AIDS.  You use needles to inject street drugs.  You live with someone who has hepatitis B.  You have had sex with someone who has hepatitis B.  You get hemodialysis treatment.  You take certain medicines for conditions, including cancer, organ transplantation, and autoimmune conditions. Hepatitis C  Blood testing is recommended for:  Everyone born from [birth year] through 1966.  Anyone with known risk factors for hepatitis C. Sexually transmitted infections (STIs)  You should be screened for sexually transmitted infections (STIs) including gonorrhea and chlamydia if:  You are sexually active and are younger than 75 years of age.  You are older than 75 years of age and your health care provider tells you that you are at risk for this type of infection.  Your sexual activity has changed since you were last screened and you are at an increased risk for chlamydia or gonorrhea. Ask your health care provider if  you are at risk.  If you do not have HIV, but are at risk, it may be recommended that you take a prescription medicine daily to prevent HIV infection. This is called pre-exposure prophylaxis (PrEP). You are considered at risk if:  You are sexually  active and do not regularly use condoms or know the HIV status of your partner(s).  You take drugs by injection.  You are sexually active with a partner who has HIV. Talk with your health care provider about whether you are at high risk of being infected with HIV. If you choose to begin PrEP, you should first be tested for HIV. You should then be tested every 3 months for as long as you are taking PrEP.  PREGNANCY   If you are premenopausal and you may become pregnant, ask your health care provider about preconception counseling.  If you may become pregnant, take 400 to 800 micrograms (mcg) of folic acid every day.  If you want to prevent pregnancy, talk to your health care provider about birth control (contraception). OSTEOPOROSIS AND MENOPAUSE   Osteoporosis is a disease in which the bones lose minerals and strength with aging. This can result in serious bone fractures. Your risk for osteoporosis can be identified using a bone density scan.  If you are 11 years of age or older, or if you are at risk for osteoporosis and fractures, ask your health care provider if you should be screened.  Ask your health care provider whether you should take a calcium or vitamin D supplement to lower your risk for osteoporosis.  Menopause may have certain physical symptoms and risks.  Hormone replacement therapy may reduce some of these symptoms and risks. Talk to your health care provider about whether hormone replacement therapy is right for you.  HOME CARE INSTRUCTIONS   Schedule regular health, dental, and eye exams.  Stay current with your immunizations.   Do not use any tobacco products including cigarettes, chewing tobacco, or electronic  cigarettes.  If you are pregnant, do not drink alcohol.  If you are breastfeeding, limit how much and how often you drink alcohol.  Limit alcohol intake to no more than 1 drink per day for nonpregnant women. One drink equals 12 ounces of beer, 5 ounces of wine, or 1 ounces of hard liquor.  Do not use street drugs.  Do not share needles.  Ask your health care provider for help if you need support or information about quitting drugs.  Tell your health care provider if you often feel depressed.  Tell your health care provider if you have ever been abused or do not feel safe at home. Document Released: 04/23/2011 Document Revised: 02/22/2014 Document Reviewed: 09/09/2013 [facility] Patient Information 2015 [place], [place]. This information is not intended to replace advice given to you by your health care provider. Make sure you discuss any questions you have with your health care provider.

## 2015-03-09 NOTE — Progress Notes (Signed)
Subjective:   Mary Pacheco is a 74 y.o. female who presents for Medicare Annual (Subsequent) preventive examination.  Review of Systems:   Cardiac Risk Factors include: advanced age (>104men, >24 women);hypertension     Objective:     Vitals: BP 132/68 mmHg  Pulse 75  Temp(Src) 98.1 F (36.7 C) (Oral)  Resp 14  Ht 5' 3.1" (1.603 m)  Wt 155 lb 8 oz (70.534 kg)  BMI 27.45 kg/m2  SpO2 98%  Tobacco History  Smoking status  . Never Smoker   Smokeless tobacco  . Never Used     Counseling given: Not Answered   Past Medical History  Diagnosis Date  . Arthritis   . Hypertension   . Hyperlipidemia     patient with HL on statins. lipids well controlled  . GERD (gastroesophageal reflux disease)     pt. with esophagea reflux. She reports great improvement with Dexilant.  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 Anxiety 2011    patient with anxiety will continue Alprazolam prn  . Hearing loss     s/p hearing aids   Past Surgical History  Procedure Laterality Date  . Abdominal hysterectomy  1993    multiple fibroid cysts  . Cataract surgery  02/2011    bilateral  . Tonsillectomy     Family History  Problem Relation Age of Onset  . Heart failure Mother   . Heart disease Mother     CABG  . Hypertension Mother   . Hyperlipidemia Mother   . Heart attack Father   . Heart failure Father   . Hyperlipidemia Father   . Hypertension Father   . Heart attack Sister 52    MI   History  Sexual Activity  . Sexual Activity: Not on file    Outpatient Encounter Prescriptions as of 03/09/2015  Medication Sig  . acyclovir (ZOVIRAX) 400 MG tablet Take 1 tablet (400 mg total) by mouth daily.  [name] ALPRAZolam (XANAX) 0.25 MG tablet Take 1 tablet (0.25 mg total) by mouth 3 (three) times daily as needed for anxiety.  [name] aspirin 81 MG tablet Take 81 mg by mouth daily.    . Beclomethasone Dipropionate (QNASL) 80 MCG/ACT AERS Place 1 spray into the nose daily.  . celecoxib (CELEBREX) 200  MG capsule Take 1 capsule (200 mg total) by mouth 2 (two) times daily.  . cholecalciferol (VITAMIN D) 1000 UNITS tablet Take 1,000 Units by mouth daily.    . Coenzyme Q10 (EQL COQ10) 300 MG CAPS Take by mouth.  . CRANBERRY PO Take by mouth.  . Flaxseed, Linseed, (FLAX SEED OIL PO) Take 1,400 mg by mouth daily.  . hydrochlorothiazide (MICROZIDE) 12.5 MG capsule Take 1 capsule (12.5 mg total) by mouth daily.  [name] levothyroxine (SYNTHROID) 50 MCG tablet Take 1 tablet (50 mcg total) by mouth daily.  . Misc Natural Products (TOTAL CARDIO HEALTH FORMULA PO) Take 1 tablet by mouth daily.    . Omega-3 Fatty Acids (FISH OIL TRIPLE STRENGTH) 1400 MG CAPS Take by mouth.  . pantoprazole (PROTONIX) 40 MG tablet TAKE 1 TABLET (40 MG TOTAL) BY MOUTH DAILY.  . rosuvastatin (CRESTOR) 5 MG tablet Take 1 tablet (5 mg total) by mouth daily.  [name] SINGULAIR 10 MG tablet Take 1 tablet (10 mg total) by mouth at bedtime.  . triamcinolone ointment (KENALOG) 0.1 % APPLY THIN FILM TO AFFECTED AREAS BY TOPICAL ROUTE AS DIRECTED FOR 7 DAYS AS NEEDED   No facility-administered encounter  medications on file as of 03/09/2015.    Activities of Daily Living In your present state of health, do you have any difficulty performing the following activities: 03/09/2015  Hearing? Y  Vision? N  Difficulty concentrating or making decisions? Y  Walking or climbing stairs? Y  Dressing or bathing? N  Doing errands, shopping? N  Preparing Food and eating ? N  Using the Toilet? N  In the past six months, have you accidently leaked urine? Y  Do you have problems with loss of bowel control? N  Managing your Medications? N  Managing your Finances? N  Housekeeping or managing your Housekeeping? N    Patient Care Team: Jackolyn Confer, MD as PCP - General (Internal Medicine) Minna Merritts, MD as Consulting Physician (Cardiology) Katha Cabal, MD (Vascular Surgery) Margaretha Sheffield, MD (Otolaryngology)    Assessment:      Exercise Activities and Dietary recommendations Current Exercise Habits:: Home exercise routine, Type of exercise: Other - see comments (uses stationary bike at home), Time (Minutes): 15, Frequency (Times/Week): 3, Weekly Exercise (Minutes/Week): 45, Intensity: Moderate  Goals    . Increase physical activity      Fall Risk Fall Risk  03/09/2015 04/06/2014 04/06/2014 03/20/2013  Falls in the past year? No No No No   Depression Screen PHQ 2/9 Scores 03/09/2015 04/06/2014 04/06/2014 03/20/2013  PHQ - 2 Score 0 0 0 0     Cognitive Testing MMSE - Mini Mental State Exam 03/09/2015  Orientation to time 5  Orientation to Place 5  Registration 3  Attention/ Calculation 5  Recall 3  Language- name 2 objects 2  Language- repeat 1  Language- follow 3 step command 3  Language- read & follow direction 1  Write a sentence 1  Copy design 1  Total score 30    Immunization History  Administered Date(s) Administered  . Influenza Split 07/12/2011, 09/15/2012, 08/06/2014  . Influenza-Unspecified 07/31/2013  . Pneumococcal Conjugate-13 04/06/2014  . Pneumococcal Polysaccharide-23 03/20/2009  . Tdap 04/06/2014  . Zoster 07/25/2013   Screening Tests Health Maintenance  Topic Date Due  . INFLUENZA VACCINE  05/23/2015  . MAMMOGRAM  04/12/2016  . TETANUS/TDAP  04/06/2024  . COLONOSCOPY  05/18/2024  . DEXA SCAN  Completed  . ZOSTAVAX  Completed      Plan:    During the course of the visit the patient was educated and counseled about the following appropriate screening and preventive services:   Vaccines to include Pneumoccal, Influenza, Hepatitis B, Td, Zostavax, HCV  Electrocardiogram  Cardiovascular Disease  Colorectal cancer screening  Bone density screening  Diabetes screening  Glaucoma screening  Mammography/PAP  Nutrition counseling   Patient Instructions (the written plan) was given to the patient.   Geni Bers, LPN  05/19/210

## 2015-04-04 ENCOUNTER — Other Ambulatory Visit: Payer: Self-pay | Admitting: Internal Medicine

## 2015-04-11 ENCOUNTER — Encounter: Payer: Self-pay | Admitting: Internal Medicine

## 2015-04-11 ENCOUNTER — Ambulatory Visit (INDEPENDENT_AMBULATORY_CARE_PROVIDER_SITE_OTHER): Payer: Medicare Other | Admitting: Internal Medicine

## 2015-04-11 VITALS — BP 122/70 | HR 69 | Temp 98.1°F | Ht 64.0 in | Wt 154.5 lb

## 2015-04-11 DIAGNOSIS — M179 Osteoarthritis of knee, unspecified: Secondary | ICD-10-CM | POA: Diagnosis not present

## 2015-04-11 DIAGNOSIS — H8110 Benign paroxysmal vertigo, unspecified ear: Secondary | ICD-10-CM

## 2015-04-11 DIAGNOSIS — E785 Hyperlipidemia, unspecified: Secondary | ICD-10-CM | POA: Diagnosis not present

## 2015-04-11 DIAGNOSIS — Z1239 Encounter for other screening for malignant neoplasm of breast: Secondary | ICD-10-CM

## 2015-04-11 DIAGNOSIS — I10 Essential (primary) hypertension: Secondary | ICD-10-CM

## 2015-04-11 DIAGNOSIS — M1710 Unilateral primary osteoarthritis, unspecified knee: Secondary | ICD-10-CM

## 2015-04-11 NOTE — Assessment & Plan Note (Signed)
Recent episode of BPV has resolved. Plan to continue to monitor. PRN Meclizine. Follow up prn.

## 2015-04-11 NOTE — Assessment & Plan Note (Signed)
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 Will continue Celebrex. Follow up in 6 months and prn.

## 2015-04-11 NOTE — Progress Notes (Signed)
Pre visit review using our clinic review tool, if applicable. No additional management support is needed unless otherwise documented below in the visit note. 

## 2015-04-11 NOTE — Assessment & Plan Note (Signed)
Reviewed lipids with pt, which were well controlled. Continue Crestor. Plan repeat LFTs and lipids in 6 months.

## 2015-04-11 NOTE — Assessment & Plan Note (Signed)
BP Readings from Last 3 Encounters:  04/11/15 122/70  03/09/15 132/68  01/05/15 152/72   BP well controlled. Will continue current medications.

## 2015-04-11 NOTE — Assessment & Plan Note (Signed)
Mammogram ordered

## 2015-04-11 NOTE — Progress Notes (Signed)
Subjective:    Patient ID: Mary Pacheco, female    DOB: 1941/07/29, 74 y.o.   MRN: [id number]  HPI 74YO female presents for follow up.  Vertigo - Had episode of vertigo in March 2016.  Had Epley maneuver with some improvement. No recurrent vertigo since that time. However, has had some intermittent temporal headache pain. Described as fleeting, last 2-3 weeks ago. Not taking anything for pain.  Knee pain - Aching pain in both knees. Followed by Dr. Marry Guan. Putting off knee replacement until husband is well enough to help with her care. Taking Celebrex on occasional with some improvement in pain. Trying to start back with PT exercises.  HL - Recently changed to Crestor. Had improvement in lipids with TC 157, LDL 75 and HDL 61 on labs in 12/2014. Tolerating medication well.  Past medical, surgical, family and social history per today's encounter.  Review of Systems  Constitutional: Negative for fever, chills, appetite change, fatigue and unexpected weight change.  Eyes: Negative for visual disturbance.  Respiratory: Negative for shortness of breath.   Cardiovascular: Negative for chest pain and leg swelling.  Gastrointestinal: Negative for abdominal pain, diarrhea and constipation.  Musculoskeletal: Positive for myalgias and arthralgias.  Skin: Negative for color change and rash.  Neurological: Positive for dizziness and headaches. Negative for seizures, syncope, weakness, light-headedness and numbness.  Hematological: Negative for adenopathy. Does not bruise/bleed easily.  Psychiatric/Behavioral: Negative for sleep disturbance and dysphoric mood. The patient is not nervous/anxious.        Objective:    BP 122/70 mmHg  Pulse 69  Temp(Src) 98.1 F (36.7 C) (Oral)  Ht 5\' 4"  (1.626 m)  Wt 154 lb 8 oz (70.081 kg)  BMI 26.51 kg/m2  SpO2 96% Physical Exam  Constitutional: She is oriented to person, place, and time. She appears well-developed and well-nourished. No distress.    HENT:  Head: Normocephalic and atraumatic.  Right Ear: External ear normal.  Left Ear: External ear normal.  Nose: Nose normal.  Mouth/Throat: Oropharynx is clear and moist. No oropharyngeal exudate.  Eyes: Conjunctivae are normal. Pupils are equal, round, and reactive to light. Right eye exhibits no discharge. Left eye exhibits no discharge. No scleral icterus.  Neck: Normal range of motion. Neck supple. No tracheal deviation present. No thyromegaly present.  Cardiovascular: Normal rate, regular rhythm, normal heart sounds and intact distal pulses.  Exam reveals no gallop and no friction rub.   No murmur heard. Pulmonary/Chest: Effort normal and breath sounds normal. No respiratory distress. She has no wheezes. She has no rales. She exhibits no tenderness.  Musculoskeletal: Normal range of motion. She exhibits no edema or tenderness.       Left knee: She exhibits normal range of motion and no swelling.  Mild crepitus left knee  Lymphadenopathy:    She has no cervical adenopathy.  Neurological: She is alert and oriented to person, place, and time. No cranial nerve deficit. She exhibits normal muscle tone. Coordination normal.  Skin: Skin is warm and dry. No rash noted. She is not diaphoretic. No erythema. No pallor.  Psychiatric: She has a normal mood and affect. Her behavior is normal. Judgment and thought content normal.          Assessment & Plan:   Problem List Items Addressed This Visit      Unprioritized   Benign paroxysmal positional vertigo    Recent episode of BPV has resolved. Plan to continue to monitor. PRN Meclizine. Follow up prn.  Hyperlipidemia    Reviewed lipids with pt, which were well controlled. Continue Crestor. Plan repeat LFTs and lipids in 6 months.      Hypertension    BP Readings from Last 3 Encounters:  04/11/15 122/70  03/09/15 132/68  01/05/15 152/72   BP well controlled. Will continue current medications.      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 Will continue Celebrex. Follow up in 6 months and prn.      Screening for breast cancer    Mammogram ordered.      Relevant Orders   MM Digital Screening       Return in about 6 months (around 10/11/2015) for Recheck.

## 2015-04-11 NOTE — Patient Instructions (Addendum)
Follow up in 6 months 

## 2015-04-18 ENCOUNTER — Encounter: Payer: Self-pay | Admitting: *Deleted

## 2015-04-18 LAB — HM MAMMOGRAPHY: HM Mammogram: NEGATIVE

## 2015-04-26 ENCOUNTER — Other Ambulatory Visit: Payer: Self-pay | Admitting: *Deleted

## 2015-04-26 MED ORDER — ROSUVASTATIN CALCIUM 5 MG PO TABS: 5.0000 mg | ORAL_TABLET | Freq: Every day | ORAL | Status: DC

## 2015-05-11 ENCOUNTER — Other Ambulatory Visit: Payer: Self-pay | Admitting: Internal Medicine

## 2015-05-24 ENCOUNTER — Encounter: Payer: Self-pay | Admitting: Cardiovascular Disease

## 2015-05-24 ENCOUNTER — Ambulatory Visit (INDEPENDENT_AMBULATORY_CARE_PROVIDER_SITE_OTHER): Payer: Medicare Other | Admitting: Cardiovascular Disease

## 2015-05-24 VITALS — BP 138/70 | HR 70 | Ht 63.5 in | Wt 154.2 lb

## 2015-05-24 DIAGNOSIS — E663 Overweight: Secondary | ICD-10-CM

## 2015-05-24 DIAGNOSIS — I10 Essential (primary) hypertension: Secondary | ICD-10-CM

## 2015-05-24 DIAGNOSIS — E785 Hyperlipidemia, unspecified: Secondary | ICD-10-CM

## 2015-05-24 DIAGNOSIS — I6523 Occlusion and stenosis of bilateral carotid arteries: Secondary | ICD-10-CM | POA: Diagnosis not present

## 2015-05-24 DIAGNOSIS — R0602 Shortness of breath: Secondary | ICD-10-CM | POA: Diagnosis not present

## 2015-05-24 NOTE — Progress Notes (Signed)
Patient ID: Mary Pacheco, female    DOB: 02-Oct-1941, 74 y.o.   MRN: [id number]  HPI Comments: 74 year old female with history of hypertension, hyperlipidemia, bilateral carotid arterial disease, worse on the right than the left, estimated at 60-69% on the right, 50% on the left, history of anxiety presenting for routine followup of her PAD  In follow-up today, she reports that she is doing well. She is taking Celebrex for arthritis pain She has follow-up with vascular surgery for her carotid stenosis Tolerating Crestor 5 mg daily. She was changed from simvastatin 20 mg daily (had myalgias on higher dose). Most recent cholesterol on simvastatin was in the 180 range.   Recent lab work showing total cholesterol in the 150 range Working with her husband to get him in better shape. He has several medical issues. Overall she feels well with no complaints  EKG on today's visit shows normal sinus rhythm with rate 70 bpm, no significant ST or T-wave changes  She denies any smoking history, no diabetes. She tries to watch what she eats  Other past medical history She does report having history of GI ulcer/bleeding in 2003 which she attributes to NSAIDs which she was taking for arthritis. She recently switched from Nexium to Protonix. She does have some discomfort in her chest in the morning when she wakes up which she attributes to GERD.   Allergies  Allergen Reactions  . Molds & Smuts   . Simvastatin     myalgia    Current Outpatient Prescriptions on File Prior to Visit  Medication Sig Dispense Refill  . acyclovir (ZOVIRAX) 400 MG tablet Take 1 tablet (400 mg total) by mouth daily. 90 tablet 1  . ALPRAZolam (XANAX) 0.25 MG tablet Take 1 tablet (0.25 mg total) by mouth 3 (three) times daily as needed for anxiety. 90 tablet 3  . aspirin 81 MG tablet Take 81 mg by mouth daily.      . Beclomethasone Dipropionate (QNASL) 80 MCG/ACT AERS Place 1 spray into the nose daily. (Patient taking  differently: Place 1 spray into the nose as needed. ) 8.7 g 3  . celecoxib (CELEBREX) 200 MG capsule Take 1 capsule (200 mg total) by mouth 2 (two) times daily. (Patient taking differently: Take 200 mg by mouth daily. ) 60 capsule 5  . cholecalciferol (VITAMIN D) 1000 UNITS tablet Take 1,000 Units by mouth daily.      . Coenzyme Q10 (EQL COQ10) 300 MG CAPS Take by mouth.    . CRANBERRY PO Take by mouth.    . Flaxseed, Linseed, (FLAX SEED OIL PO) Take 1,400 mg by mouth daily.    . hydrochlorothiazide (MICROZIDE) 12.5 MG capsule Take 1 capsule (12.5 mg total) by mouth daily. 90 capsule 3  . levothyroxine (SYNTHROID) 50 MCG tablet Take 1 tablet (50 mcg total) by mouth daily. (Patient taking differently: Take 50 mcg by mouth daily. Takes 75 mg on Wednesdays.) 90 tablet 4  . Misc Natural Products (TOTAL CARDIO HEALTH FORMULA PO) Take 1 tablet by mouth daily.      . Omega-3 Fatty Acids (FISH OIL TRIPLE STRENGTH) 1400 MG CAPS Take by mouth.    . pantoprazole (PROTONIX) 40 MG tablet TAKE 1 TABLET (40 MG TOTAL) BY MOUTH DAILY. 90 tablet 1  . Probiotic Product (PROBIOTIC DAILY PO) Take by mouth.    . rosuvastatin (CRESTOR) 5 MG tablet Take 1 tablet (5 mg total) by mouth daily. 90 tablet 0  . SINGULAIR 10 MG tablet Take 1  tablet (10 mg total) by mouth at bedtime. 90 tablet 3  . triamcinolone ointment (KENALOG) 0.1 % APPLY THIN FILM TO AFFECTED AREAS BY TOPICAL ROUTE AS DIRECTED FOR 7 DAYS AS NEEDED 30 g 1   No current facility-administered medications on file prior to visit.    Past Medical History  Diagnosis Date  . Arthritis   . Hypertension   . Hyperlipidemia     patient with HL on statins. lipids well controlled  . GERD (gastroesophageal reflux disease)     pt. with esophagea reflux. She reports great improvement with Dexilant.  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 Anxiety 2011    patient with anxiety will continue Alprazolam prn  . Hearing loss     s/p hearing aids    Past Surgical History   Procedure Laterality Date  . Abdominal hysterectomy  1993    multiple fibroid cysts  . Cataract surgery  02/2011    bilateral  . Tonsillectomy      Social History  reports that she has never smoked. She has never used smokeless tobacco. She reports that she drinks alcohol. She reports that she does not use illicit drugs.  Family History family history includes Heart attack in her father; Heart attack (age of onset: 28) in her sister; Heart disease in her mother; Heart failure in her father and mother; Hyperlipidemia in her father and mother; Hypertension in her father and mother.     Review of Systems  HENT: Negative.   Eyes: Negative.   Respiratory: Negative.   Cardiovascular: Negative.   Gastrointestinal: Negative.   Endocrine: Negative.   Musculoskeletal: Positive for arthralgias.  Skin: Negative.   Allergic/Immunologic: Negative.   Neurological: Negative.   Hematological: Negative.   Psychiatric/Behavioral: Negative.   All other systems reviewed and are negative.   BP 138/70 mmHg  Pulse 70  Ht 5' 3.5" (1.613 m)  Wt 154 lb 4 oz (69.967 kg)  BMI 26.89 kg/m2  Physical Exam  Constitutional: She is oriented to person, place, and time. She appears well-developed and well-nourished.  HENT:  Head: Normocephalic.  Nose: Nose normal.  Mouth/Throat: Oropharynx is clear and moist.  Eyes: Conjunctivae are normal. Pupils are equal, round, and reactive to light.  Neck: Normal range of motion. Neck supple. No JVD present.  Cardiovascular: Normal rate, regular rhythm, S1 normal, S2 normal, normal heart sounds and intact distal pulses.  Exam reveals no gallop and no friction rub.   No murmur heard. Pulmonary/Chest: Effort normal and breath sounds normal. No respiratory distress. She has no wheezes. She has no rales. She exhibits no tenderness.  Abdominal: Soft. Bowel sounds are normal. She exhibits no distension. There is no tenderness.  Musculoskeletal: Normal range of motion.  She exhibits no edema or tenderness.  Lymphadenopathy:    She has no cervical adenopathy.  Neurological: She is alert and oriented to person, place, and time. Coordination normal.  Skin: Skin is warm and dry. No rash noted. No erythema.  Psychiatric: She has a normal mood and affect. Her behavior is normal. Judgment and thought content normal.    Assessment and Plan  Nursing note and vitals reviewed.

## 2015-05-24 NOTE — Assessment & Plan Note (Signed)
She had difficulty tolerating generic Crestor. Currently taking branded Crestor 5 mg daily. Goal LDL less than 70

## 2015-05-24 NOTE — Assessment & Plan Note (Signed)
We have stressed the importance of exercise and weight loss

## 2015-05-24 NOTE — Assessment & Plan Note (Signed)
Blood pressure is well controlled on today's visit. No changes made to the medications. 

## 2015-05-24 NOTE — Assessment & Plan Note (Signed)
Continue aggressive cholesterol management. She reports this is being managed by vascular surgery Prior stenosis 50% bilaterally

## 2015-05-24 NOTE — Patient Instructions (Signed)
You are doing well. No medication changes were made.  Please call us if you have new issues that need to be addressed before your next appt.  Your physician wants you to follow-up in: 12 months.  You will receive a reminder letter in the mail two months in advance. If you don't receive a letter, please call our office to schedule the follow-up appointment. 

## 2015-06-06 ENCOUNTER — Other Ambulatory Visit: Payer: Self-pay | Admitting: Internal Medicine

## 2015-06-17 ENCOUNTER — Other Ambulatory Visit: Payer: Self-pay | Admitting: Internal Medicine

## 2015-06-17 NOTE — Telephone Encounter (Signed)
Last OV 6.20.16.  Please advise refill

## 2015-06-20 NOTE — Telephone Encounter (Signed)
rx faxed

## 2015-06-22 ENCOUNTER — Other Ambulatory Visit: Payer: Self-pay | Admitting: Internal Medicine

## 2015-06-22 NOTE — Telephone Encounter (Signed)
rx faxed by [facility]

## 2015-06-22 NOTE — Telephone Encounter (Signed)
Last OV 6.20.16.  Please advise refill 

## 2015-07-12 ENCOUNTER — Other Ambulatory Visit: Payer: Self-pay | Admitting: Internal Medicine

## 2015-07-12 NOTE — Telephone Encounter (Signed)
Last OV 6.20.16.  Please advise refill 

## 2015-07-18 ENCOUNTER — Ambulatory Visit (INDEPENDENT_AMBULATORY_CARE_PROVIDER_SITE_OTHER): Payer: Medicare Other | Admitting: Family Medicine

## 2015-07-18 ENCOUNTER — Encounter: Payer: Self-pay | Admitting: Family Medicine

## 2015-07-18 VITALS — BP 136/82 | HR 71 | Temp 98.5°F | Ht 63.5 in

## 2015-07-18 DIAGNOSIS — J309 Allergic rhinitis, unspecified: Secondary | ICD-10-CM | POA: Diagnosis not present

## 2015-07-18 MED ORDER — BECLOMETHASONE DIPROPIONATE 80 MCG/ACT NA AERS: 1.0000 | INHALATION_SPRAY | Freq: Every day | NASAL | Status: DC

## 2015-07-18 NOTE — Assessment & Plan Note (Signed)
Patient with report of bilateral HA and bilateral discomfort anterior to the ears. Neurologically intact. No abnormalities noted on exam. Has had similar symptoms in the past. Negative work up per ENT and vascular per review of notes and patient report. Prior MRI with no reported abnormalities per patient. Suspect this discomfort she is having is related to allergies given persistent post nasal drip. Could be that the discomfort anterior to her ears is eustacian tube dysfunction. Doubt intracranial process given similarity to prior HAs, normal neurological exam, and reported normal MRI. Consider trigeminal neuralgia with description of shooting discomfort if no improvement with nasal steroid. No temple tenderness to indicate TA. Refill of qnasl given. Given return precautions.

## 2015-07-18 NOTE — Progress Notes (Signed)
Pre visit review using our clinic review tool, if applicable. No additional management support is needed unless otherwise documented below in the visit note. 

## 2015-07-18 NOTE — Patient Instructions (Signed)
Nice to meet you. Your headaches may be related to allergies and we will restart you on nasal steroids.  Please use this daily. If you develop headaches, vision changes, numbness, weakness, light headedness, chest pain, shortness of breath, feel as though you are going to pass out, or have any change in symptoms please seek medical attention.

## 2015-07-18 NOTE — Progress Notes (Signed)
Patient ID: Mary Pacheco, female   DOB: Mar 20, 1941, 74 y.o.   MRN: [id number]   Tommi Rumps, MD Phone: [phone]  Mary Pacheco is a 74 y.o. female who presents today for same day appointment.   Patient presents with intermittent symptoms of bitemporal headache that is a pressure sensation. Notes this feels "like a sickish headache." Notes intermittent shooting pain in front of both ears. Denies vertigo. Denies numbness, weakness, and vision changes. These symptoms are all intermittent. Has history of tension headaches and these are similar. Has chronic post nasal drip. Takes "alovert" and xanax for this with some benefit. Has reportedly seen ENT and vascular for this with no apparent cause. Has had a number of MRI's and reports these have been normal. Denies chest pain, shortness of breath, presyncope, and syncope with this. No light headedness with this.   PMH: nonsmoker.   ROS see HPI  Objective  Physical Exam Filed Vitals:   07/18/15 1102  BP: 136/82  Pulse: 71  Temp: 98.5 F (36.9 C)   Lying BP 120/72 Sitting BP 110/62 Standing BP 130/70  Physical Exam  Constitutional: She is well-developed, well-nourished, and in no distress.  HENT:  Head: Normocephalic and atraumatic.  Right Ear: External ear normal.  Left Ear: External ear normal.  Nose: Nose normal.  Mouth/Throat: Oropharynx is clear and moist. No oropharyngeal exudate.  Normal TMs bilaterally  Eyes: Conjunctivae and EOM are normal. Pupils are equal, round, and reactive to light.  Neck: Neck supple.  Cardiovascular: Normal rate, regular rhythm and normal heart sounds.  Exam reveals no gallop and no friction rub.   No murmur heard. Pulmonary/Chest: Effort normal and breath sounds normal. No respiratory distress. She has no wheezes. She has no rales.  Lymphadenopathy:    She has no cervical adenopathy.  Neurological: She is alert.  CN 2-12 intact, 5/5 strength in bilateral biceps, triceps, grip, quads,  hamstrings, plantar and dorsiflexion, sensation to light touch intact in bilateral UE and LE, normal gait, 2+ patellar reflexes, negative rhomberg, no pronator drift  Skin: Skin is warm and dry. She is not diaphoretic.  negative dix halpike. No TMJ discomfort on palpation or jaw opening.  No tenderness of temples   Assessment/Plan: Please see individual problem list.  Rhinitis, allergic Patient with report of bilateral HA and bilateral discomfort anterior to the ears. Neurologically intact. No abnormalities noted on exam. Has had similar symptoms in the past. Negative work up per ENT and vascular per review of notes and patient report. Prior MRI with no reported abnormalities per patient. Suspect this discomfort she is having is related to allergies given persistent post nasal drip. Could be that the discomfort anterior to her ears is eustacian tube dysfunction. Doubt intracranial process given similarity to prior HAs, normal neurological exam, and reported normal MRI. Consider trigeminal neuralgia with description of shooting discomfort if no improvement with nasal steroid. No temple tenderness to indicate TA. Refill of qnasl given. Given return precautions.     Meds ordered this encounter  Medications  . Beclomethasone Dipropionate (QNASL) 80 MCG/ACT AERS    Sig: Place 1 spray into the nose daily.    Dispense:  8.7 g    Refill:  3    Tommi Rumps

## 2015-08-02 ENCOUNTER — Telehealth: Payer: Self-pay

## 2015-08-02 NOTE — Telephone Encounter (Signed)
Singulair does not need to be weaned. She can just stop the medication.

## 2015-08-02 NOTE — Telephone Encounter (Signed)
Patient wants to wean off singular, due to side effects and conflict with insurance.  She doesn't like how it makes her feel to wean abruptly.  Please advise?

## 2015-08-03 ENCOUNTER — Telehealth: Payer: Self-pay | Admitting: *Deleted

## 2015-08-03 ENCOUNTER — Other Ambulatory Visit: Payer: Self-pay

## 2015-08-03 MED ORDER — SINGULAIR 10 MG PO TABS: 10.0000 mg | ORAL_TABLET | Freq: Every day | ORAL | Status: DC

## 2015-08-03 NOTE — Telephone Encounter (Signed)
Patient has requested a call back about her Singulair medication. She wants to consult with someone, she needs an authorization for the medication -thanks

## 2015-08-03 NOTE — Telephone Encounter (Signed)
Patient made a mistake with the insurance, she needs to get a new authorization for her singular, must be DAW.  I will refill and see if [facility] can re fax request for PA.

## 2015-08-05 ENCOUNTER — Other Ambulatory Visit: Payer: Self-pay | Admitting: Internal Medicine

## 2015-08-11 ENCOUNTER — Telehealth: Payer: Self-pay | Admitting: Internal Medicine

## 2015-08-11 ENCOUNTER — Other Ambulatory Visit: Payer: Self-pay

## 2015-08-11 MED ORDER — SINGULAIR 10 MG PO TABS: 10.0000 mg | ORAL_TABLET | Freq: Every day | ORAL | Status: DC

## 2015-08-11 NOTE — Telephone Encounter (Signed)
I have completed her request.

## 2015-08-11 NOTE — Telephone Encounter (Signed)
Pt called to request for rx of Singular to be [facility] to [email or web address]

## 2015-10-03 ENCOUNTER — Telehealth: Payer: Self-pay | Admitting: Internal Medicine

## 2015-10-03 NOTE — Telephone Encounter (Signed)
Lelan Pons [id number] called from [facility] stating pt needs a prior auth for SINGULAIR 10 MG tablet. Call [phone]. Thank You!

## 2015-10-05 ENCOUNTER — Other Ambulatory Visit: Payer: Self-pay

## 2015-10-05 DIAGNOSIS — R0602 Shortness of breath: Secondary | ICD-10-CM

## 2015-10-05 NOTE — Telephone Encounter (Signed)
Spoke with Mary Pacheco at [employer], PA key code given, Started PA on Cover my meds.  Medication is on a non preferred list for her insurance.

## 2015-10-05 NOTE — Telephone Encounter (Signed)
Request approved from 10/05/2015 to 10/04/2016.

## 2015-10-11 ENCOUNTER — Encounter: Payer: Self-pay | Admitting: Internal Medicine

## 2015-10-11 ENCOUNTER — Ambulatory Visit (INDEPENDENT_AMBULATORY_CARE_PROVIDER_SITE_OTHER): Payer: Medicare Other | Admitting: Internal Medicine

## 2015-10-11 VITALS — BP 135/68 | HR 65 | Temp 97.8°F | Ht 64.0 in | Wt 153.2 lb

## 2015-10-11 DIAGNOSIS — E785 Hyperlipidemia, unspecified: Secondary | ICD-10-CM

## 2015-10-11 DIAGNOSIS — I10 Essential (primary) hypertension: Secondary | ICD-10-CM

## 2015-10-11 DIAGNOSIS — M5431 Sciatica, right side: Secondary | ICD-10-CM | POA: Diagnosis not present

## 2015-10-11 LAB — COMPREHENSIVE METABOLIC PANEL
ALT: 26 U/L (ref 0–35)
AST: 26 U/L (ref 0–37)
Albumin: 4.8 g/dL (ref 3.5–5.2)
Alkaline Phosphatase: 77 U/L (ref 39–117)
BUN: 15 mg/dL (ref 6–23)
CHLORIDE: 103 meq/L (ref 96–112)
CO2: 29 meq/L (ref 19–32)
CREATININE: 0.8 mg/dL (ref 0.40–1.20)
Calcium: 10.1 mg/dL (ref 8.4–10.5)
GFR: 74.28 mL/min (ref >60.00)
Glucose, Bld: 103 mg/dL — ABNORMAL HIGH (ref 70–99)
Potassium: 5 mEq/L (ref 3.5–5.1)
SODIUM: 141 meq/L (ref 135–145)
Total Bilirubin: 0.6 mg/dL (ref 0.2–1.2)
Total Protein: 7 g/dL (ref 6.0–8.3)

## 2015-10-11 LAB — MAGNESIUM: MAGNESIUM: 2.5 mg/dL (ref 1.5–2.5)

## 2015-10-11 LAB — LIPID PANEL
CHOL/HDL RATIO: 2
Cholesterol: 160 mg/dL (ref 0–200)
HDL: 64.2 mg/dL (ref >39.00)
LDL CALC: 72 mg/dL (ref 0–99)
NonHDL: 95.65
Triglycerides: 120 mg/dL (ref 0.0–149.0)
VLDL: 24 mg/dL (ref 0.0–40.0)

## 2015-10-11 NOTE — Progress Notes (Signed)
Pre visit review using our clinic review tool, if applicable. No additional management support is needed unless otherwise documented below in the visit note. 

## 2015-10-11 NOTE — Assessment & Plan Note (Signed)
BP Readings from Last 3 Encounters:  10/11/15 135/68  07/18/15 136/82  05/24/15 138/70   BP generally well controlled. Renal function with labs today. Continue current medication. She requested check of Magnesium, order placed.

## 2015-10-11 NOTE — Assessment & Plan Note (Signed)
Discussed some options including changing to Atorvastatin. She will call when out of Crestor and we will try Atorvastatin 10mg  daily.

## 2015-10-11 NOTE — Assessment & Plan Note (Signed)
Symptoms and exam c/w sciatic pain. Discussed PT, acupuncture. Encouraged her to use Celebrex. Discussed adding Prednisone if symptoms not improving.

## 2015-10-11 NOTE — Patient Instructions (Signed)
Labs today.  Follow up in 6 months and sooner as needed. 

## 2015-10-11 NOTE — Progress Notes (Signed)
Subjective:    Patient ID: Mary Pacheco, female    DOB: 1940-12-24, 74 y.o.   MRN: [id number]  HPI  74YO female presents for follow up.  HL - Was not able to tolerate the generic Crestor. Now taking brand name. Tolerating well. Insurance will no longer cover.  Having some trouble with sciatic pain. Seeing an accupunturist.  Some improvement with this. Also using ice packs with some improvement. Described as severe cramping pain that radiates down right leg. No weakness in leg. No numbness. Has not recently taken Celebrex.  Using cabbage applied externally to left knee with some improvement in pain.  Notes increased anxiety with Holidays. Planning to rest next weekend.   Wt Readings from Last 3 Encounters:  10/11/15 153 lb 4 oz (69.514 kg)  05/24/15 154 lb 4 oz (69.967 kg)  04/11/15 154 lb 8 oz (70.081 kg)   BP Readings from Last 3 Encounters:  10/11/15 135/68  07/18/15 136/82  05/24/15 138/70    Past Medical History  Diagnosis Date  . Arthritis   . Hypertension   . Hyperlipidemia     patient with HL on statins. lipids well controlled  . GERD (gastroesophageal reflux disease)     pt. with esophagea reflux. She reports great improvement with Dexilant.  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 Anxiety 2011    patient with anxiety will continue Alprazolam prn  . Hearing loss     s/p hearing aids   Family History  Problem Relation Age of Onset  . Heart failure Mother   . Heart disease Mother     CABG  . Hypertension Mother   . Hyperlipidemia Mother   . Heart attack Father   . Heart failure Father   . Hyperlipidemia Father   . Hypertension Father   . Heart attack Sister 90    MI   Past Surgical History  Procedure Laterality Date  . Abdominal hysterectomy  1993    multiple fibroid cysts  . Cataract surgery  02/2011    bilateral  . Tonsillectomy     Social History   Social History  . Marital Status: Married    Spouse Name: N/A  . Number of Children: N/A  .  Years of Education: N/A   Social History Main Topics  . Smoking status: Never Smoker   . Smokeless tobacco: Never Used  . Alcohol Use: Yes     Comment: socially  . Drug Use: No  . Sexual Activity: Not Asked   Other Topics Concern  . None   Social History Narrative    Review of Systems  Constitutional: Negative for fever, chills, appetite change, fatigue and unexpected weight change.  Eyes: Negative for visual disturbance.  Respiratory: Negative for cough and shortness of breath.   Cardiovascular: Negative for chest pain and leg swelling.  Gastrointestinal: Negative for nausea, vomiting, abdominal pain, diarrhea and constipation.  Musculoskeletal: Positive for myalgias, back pain and arthralgias.  Skin: Negative for color change and rash.  Neurological: Negative for weakness and numbness.  Hematological: Negative for adenopathy. Does not bruise/bleed easily.  Psychiatric/Behavioral: Negative for sleep disturbance and dysphoric mood. The patient is nervous/anxious.        Objective:    BP 135/68 mmHg  Pulse 65  Temp(Src) 97.8 F (36.6 C) (Oral)  Ht 5\' 4"  (1.626 m)  Wt 153 lb 4 oz (69.514 kg)  BMI 26.29 kg/m2  SpO2 99% Physical Exam  Constitutional: She is oriented to person, place,  and time. She appears well-developed and well-nourished. No distress.  HENT:  Head: Normocephalic and atraumatic.  Right Ear: External ear normal.  Left Ear: External ear normal.  Nose: Nose normal.  Mouth/Throat: Oropharynx is clear and moist. No oropharyngeal exudate.  Eyes: Conjunctivae are normal. Pupils are equal, round, and reactive to light. Right eye exhibits no discharge. Left eye exhibits no discharge. No scleral icterus.  Neck: Normal range of motion. Neck supple. No tracheal deviation present. No thyromegaly present.  Cardiovascular: Normal rate, regular rhythm, normal heart sounds and intact distal pulses.  Exam reveals no gallop and no friction rub.   No murmur  heard. Pulmonary/Chest: Effort normal and breath sounds normal. No respiratory distress. She has no wheezes. She has no rales. She exhibits no tenderness.  Musculoskeletal: Normal range of motion. She exhibits no edema or tenderness.  Lymphadenopathy:    She has no cervical adenopathy.  Neurological: She is alert and oriented to person, place, and time. No cranial nerve deficit. She exhibits normal muscle tone. Coordination normal.  Skin: Skin is warm and dry. No rash noted. She is not diaphoretic. No erythema. No pallor.  Psychiatric: She has a normal mood and affect. Her behavior is normal. Judgment and thought content normal.          Assessment & Plan:   Problem List Items Addressed This Visit      Unprioritized   Hyperlipidemia    Discussed some options including changing to Atorvastatin. She will call when out of Crestor and we will try Atorvastatin 10mg  daily.       Relevant Orders   Comprehensive metabolic panel   Lipid panel   Hypertension - Primary    BP Readings from Last 3 Encounters:  10/11/15 135/68  07/18/15 136/82  05/24/15 138/70   BP generally well controlled. Renal function with labs today. Continue current medication. She requested check of Magnesium, order placed.      Relevant Orders   Magnesium   Right sciatic nerve pain    Symptoms and exam c/w sciatic pain. Discussed PT, acupuncture. Encouraged her to use Celebrex. Discussed adding Prednisone if symptoms not improving.          Return in about 6 months (around 04/10/2016) for Recheck.

## 2015-11-03 ENCOUNTER — Ambulatory Visit (INDEPENDENT_AMBULATORY_CARE_PROVIDER_SITE_OTHER)
Admission: RE | Admit: 2015-11-03 | Discharge: 2015-11-03 | Disposition: A | Discharge status: Home / Self Care | Payer: Medicare Other | Source: Ambulatory Visit | Attending: Cardiovascular Disease | Admitting: Cardiovascular Disease

## 2015-11-03 DIAGNOSIS — R0602 Shortness of breath: Secondary | ICD-10-CM

## 2015-11-05 ENCOUNTER — Other Ambulatory Visit: Payer: Self-pay | Admitting: Internal Medicine

## 2015-11-11 ENCOUNTER — Other Ambulatory Visit: Payer: Self-pay | Admitting: Internal Medicine

## 2015-12-01 ENCOUNTER — Other Ambulatory Visit: Payer: Self-pay | Admitting: Internal Medicine

## 2016-01-05 ENCOUNTER — Other Ambulatory Visit: Payer: Self-pay | Admitting: Internal Medicine

## 2016-01-15 ENCOUNTER — Other Ambulatory Visit: Payer: Self-pay | Admitting: Internal Medicine

## 2016-01-16 ENCOUNTER — Telehealth: Payer: Self-pay | Admitting: *Deleted

## 2016-01-16 NOTE — Telephone Encounter (Signed)
The cost to the pt for a 90 day supply of the generic Crestor is 75.00 dollars, the pharmacy stated that you could choose from generic Liptior or generic Zocor. Please advise, thanks

## 2016-01-16 NOTE — Telephone Encounter (Signed)
We can try Atorvastatin 20mg  daily #30 with 1 refill. Needs follow up visit in 4 weeks.

## 2016-01-16 NOTE — Telephone Encounter (Signed)
Will they cover generic Crestor, called Rosuvastatin?

## 2016-01-16 NOTE — Telephone Encounter (Signed)
Pt is requesting a medication change, the Crestor is too expensive. Please advise, thanks

## 2016-01-16 NOTE — Telephone Encounter (Signed)
Patient stated that her insurance will not continue to cover her Crestor, she will be out of this medication in one month. She will like to know her options to replace this medication.  Pt contact [phone]

## 2016-01-18 ENCOUNTER — Other Ambulatory Visit: Payer: Self-pay | Admitting: *Deleted

## 2016-01-18 MED ORDER — ATORVASTATIN CALCIUM 20 MG PO TABS: 20.0000 mg | ORAL_TABLET | Freq: Every day | ORAL | Status: DC

## 2016-01-18 NOTE — Telephone Encounter (Signed)
Notified pt, sent rx to pharmacy, scheduled follow up appt

## 2016-02-06 ENCOUNTER — Telehealth: Payer: Self-pay | Admitting: Internal Medicine

## 2016-02-06 MED ORDER — SINGULAIR 10 MG PO TABS: 10.0000 mg | ORAL_TABLET | Freq: Every day | ORAL | Status: DC

## 2016-02-06 NOTE — Telephone Encounter (Signed)
Pt called back to request rx be [facility] to [facility]

## 2016-02-06 NOTE — Telephone Encounter (Signed)
Refilled to local [facility] per patients request. Thanks

## 2016-02-06 NOTE — Addendum Note (Signed)
Addended by: Bevelyn Ngo on: 02/06/2016 01:35 PM   Modules accepted: Orders

## 2016-02-06 NOTE — Telephone Encounter (Signed)
Resent to [facility] 

## 2016-02-06 NOTE — Telephone Encounter (Signed)
Pt called to request a refill on Singular. Please advise pt/msn

## 2016-02-21 ENCOUNTER — Encounter: Payer: Self-pay | Admitting: Internal Medicine

## 2016-02-21 ENCOUNTER — Ambulatory Visit (INDEPENDENT_AMBULATORY_CARE_PROVIDER_SITE_OTHER): Payer: Medicare Other | Admitting: Internal Medicine

## 2016-02-21 VITALS — BP 136/72 | HR 76 | Ht 64.0 in | Wt 153.1 lb

## 2016-02-21 DIAGNOSIS — M722 Plantar fascial fibromatosis: Secondary | ICD-10-CM | POA: Diagnosis not present

## 2016-02-21 DIAGNOSIS — I10 Essential (primary) hypertension: Secondary | ICD-10-CM

## 2016-02-21 DIAGNOSIS — E039 Hypothyroidism, unspecified: Secondary | ICD-10-CM

## 2016-02-21 DIAGNOSIS — E785 Hyperlipidemia, unspecified: Secondary | ICD-10-CM | POA: Diagnosis not present

## 2016-02-21 LAB — COMPREHENSIVE METABOLIC PANEL
ALT: 31 U/L (ref 0–35)
AST: 27 U/L (ref 0–37)
Albumin: 4.9 g/dL (ref 3.5–5.2)
Alkaline Phosphatase: 81 U/L (ref 39–117)
BUN: 17 mg/dL (ref 6–23)
CHLORIDE: 103 meq/L (ref 96–112)
CO2: 28 meq/L (ref 19–32)
Calcium: 10.3 mg/dL (ref 8.4–10.5)
Creatinine, Ser: 0.9 mg/dL (ref 0.40–1.20)
GFR: 64.78 mL/min (ref >60.00)
GLUCOSE: 111 mg/dL — AB (ref 70–99)
POTASSIUM: 4.5 meq/L (ref 3.5–5.1)
Sodium: 140 mEq/L (ref 135–145)
Total Bilirubin: 0.5 mg/dL (ref 0.2–1.2)
Total Protein: 7.2 g/dL (ref 6.0–8.3)

## 2016-02-21 LAB — TSH: TSH: 1.57 u[IU]/mL (ref 0.35–4.50)

## 2016-02-21 MED ORDER — ALPRAZOLAM 0.25 MG PO TABS: ORAL_TABLET | ORAL | Status: DC

## 2016-02-21 NOTE — Assessment & Plan Note (Signed)
BP Readings from Last 3 Encounters:  02/21/16 136/72  10/11/15 135/68  07/18/15 136/82   BP well controlled. Renal function with labs.

## 2016-02-21 NOTE — Patient Instructions (Addendum)
Labs today.  We will set up evaluation with Dr. Vickki Muff.  Follow up in 6 months and sooner as needed.

## 2016-02-21 NOTE — Assessment & Plan Note (Signed)
Symptoms c/w plantar fasciitis. No improvement with Celebrex, and accupuncture. Will set up evaluation with podiatry.

## 2016-02-21 NOTE — Assessment & Plan Note (Signed)
Will check LFTs with labs. Continue Atorvastatin. 

## 2016-02-21 NOTE — Progress Notes (Signed)
Subjective:    Patient ID: Mary Pacheco, female    DOB: January 30, 1941, 75 y.o.   MRN: [id number]  HPI  75YO female presents for follow up.  Right heel pain - Pain like a bruise. Going for for about 1 month. Pain is worse when she first starts to walk, then improves. Seen by Dr. Vickki Muff in the past and treated with Voltaren. Had some improvement with this. Trying to find a more supportive shoe. Has also tried accupuncture with minimal improvement.  HTN - BP at home typically well controlled. Compliant with medication.   Wt Readings from Last 3 Encounters:  02/21/16 153 lb 1.9 oz (69.455 kg)  10/11/15 153 lb 4 oz (69.514 kg)  05/24/15 154 lb 4 oz (69.967 kg)   BP Readings from Last 3 Encounters:  02/21/16 136/72  10/11/15 135/68  07/18/15 136/82    Past Medical History  Diagnosis Date  . Arthritis   . Hypertension   . Hyperlipidemia     patient with HL on statins. lipids well controlled  . GERD (gastroesophageal reflux disease)     pt. with esophagea reflux. She reports great improvement with Dexilant.  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 Anxiety 2011    patient with anxiety will continue Alprazolam prn  . Hearing loss     s/p hearing aids   Family History  Problem Relation Age of Onset  . Heart failure Mother   . Heart disease Mother     CABG  . Hypertension Mother   . Hyperlipidemia Mother   . Heart attack Father   . Heart failure Father   . Hyperlipidemia Father   . Hypertension Father   . Heart attack Sister 74    MI   Past Surgical History  Procedure Laterality Date  . Abdominal hysterectomy  1993    multiple fibroid cysts  . Cataract surgery  02/2011    bilateral  . Tonsillectomy     Social History   Social History  . Marital Status: Married    Spouse Name: N/A  . Number of Children: N/A  . Years of Education: N/A   Social History Main Topics  . Smoking status: Never Smoker   . Smokeless tobacco: Never Used  . Alcohol Use: Yes     Comment:  socially  . Drug Use: No  . Sexual Activity: Not Asked   Other Topics Concern  . None   Social History Narrative    Review of Systems  Constitutional: Negative for fever, chills, appetite change, fatigue and unexpected weight change.  Eyes: Negative for visual disturbance.  Respiratory: Negative for cough, shortness of breath and wheezing.   Cardiovascular: Negative for chest pain, palpitations and leg swelling.  Gastrointestinal: Negative for nausea, vomiting, abdominal pain, diarrhea and constipation.  Musculoskeletal: Positive for myalgias and arthralgias.  Skin: Negative for color change and rash.  Hematological: Negative for adenopathy. Does not bruise/bleed easily.  Psychiatric/Behavioral: Negative for sleep disturbance and dysphoric mood. The patient is not nervous/anxious.        Objective:    BP 136/72 mmHg  Pulse 76  Ht 5\' 4"  (1.626 m)  Wt 153 lb 1.9 oz (69.455 kg)  BMI 26.27 kg/m2  SpO2 98% Physical Exam  Constitutional: She is oriented to person, place, and time. She appears well-developed and well-nourished. No distress.  HENT:  Head: Normocephalic and atraumatic.  Right Ear: External ear normal.  Left Ear: External ear normal.  Nose: Nose normal.  Mouth/Throat:  Oropharynx is clear and moist. No oropharyngeal exudate.  Eyes: Conjunctivae are normal. Pupils are equal, round, and reactive to light. Right eye exhibits no discharge. Left eye exhibits no discharge. No scleral icterus.  Neck: Normal range of motion. Neck supple. No tracheal deviation present. No thyromegaly present.  Cardiovascular: Normal rate, regular rhythm, normal heart sounds and intact distal pulses.  Exam reveals no gallop and no friction rub.   No murmur heard. Pulmonary/Chest: Effort normal and breath sounds normal. No respiratory distress. She has no wheezes. She has no rales. She exhibits no tenderness.  Musculoskeletal: Normal range of motion. She exhibits no edema.       Right foot:  There is tenderness. There is normal range of motion.  Lymphadenopathy:    She has no cervical adenopathy.  Neurological: She is alert and oriented to person, place, and time. No cranial nerve deficit. She exhibits normal muscle tone. Coordination normal.  Skin: Skin is warm and dry. No rash noted. She is not diaphoretic. No erythema. No pallor.  Psychiatric: She has a normal mood and affect. Her behavior is normal. Judgment and thought content normal.          Assessment & Plan:   Problem List Items Addressed This Visit      Unprioritized   Hyperlipidemia    Will check LFTs with labs. Continue Atorvastatin.      Hypertension - Primary    BP Readings from Last 3 Encounters:  02/21/16 136/72  10/11/15 135/68  07/18/15 136/82   BP well controlled. Renal function with labs.      Relevant Orders   Comprehensive metabolic panel   Hypothyroidism    Will check TSH with labs. Continue Levothyroxine.      Relevant Orders   TSH   Plantar fasciitis, right    Symptoms c/w plantar fasciitis. No improvement with Celebrex, and accupuncture. Will set up evaluation with podiatry.      Relevant Orders   Ambulatory referral to Podiatry       Return in about 6 months (around 08/23/2016) for Physical.  Ronette Deter, MD [facility]

## 2016-02-21 NOTE — Assessment & Plan Note (Signed)
Will check TSH with labs. Continue Levothyroxine. 

## 2016-02-27 ENCOUNTER — Telehealth: Payer: Self-pay | Admitting: Internal Medicine

## 2016-02-27 NOTE — Telephone Encounter (Signed)
Would you recommend this.

## 2016-02-27 NOTE — Telephone Encounter (Signed)
Patient was upset that a lipid panel was not done and would not schedule a appointment to talk about it.

## 2016-02-27 NOTE — Telephone Encounter (Signed)
Pt wanted to know it she could use her old medication before starting the atorvastatin (LIPITOR) 20 MG tablet.

## 2016-02-27 NOTE — Telephone Encounter (Signed)
She should continue the medication. We can make a 55min follow up visit.

## 2016-02-27 NOTE — Telephone Encounter (Signed)
The Lipitor?

## 2016-02-27 NOTE — Telephone Encounter (Signed)
Patient states that she wants to take it due to her having paid money for it. She doesn't want to feel like she wasting that money. She wants to know if its okay to take it. She also states that she thought she would have had a lipid panel with her last labs on 02/21/16, but it was not done she didn't know why that was.

## 2016-02-27 NOTE — Telephone Encounter (Signed)
Please clarify

## 2016-02-27 NOTE — Telephone Encounter (Signed)
Yes, and make a 73min follow up

## 2016-03-06 ENCOUNTER — Other Ambulatory Visit: Payer: Self-pay | Admitting: Internal Medicine

## 2016-03-06 MED ORDER — ATORVASTATIN CALCIUM 20 MG PO TABS: 20.0000 mg | ORAL_TABLET | Freq: Every day | ORAL | Status: DC

## 2016-03-15 ENCOUNTER — Other Ambulatory Visit: Payer: Self-pay | Admitting: Internal Medicine

## 2016-03-19 ENCOUNTER — Other Ambulatory Visit: Payer: Self-pay | Admitting: Internal Medicine

## 2016-04-16 ENCOUNTER — Ambulatory Visit: Payer: Medicare Other | Admitting: Internal Medicine

## 2016-05-21 ENCOUNTER — Other Ambulatory Visit: Payer: Self-pay | Admitting: Internal Medicine

## 2016-05-22 ENCOUNTER — Ambulatory Visit (INDEPENDENT_AMBULATORY_CARE_PROVIDER_SITE_OTHER): Payer: Medicare Other | Admitting: Internal Medicine

## 2016-05-22 ENCOUNTER — Encounter: Payer: Self-pay | Admitting: Internal Medicine

## 2016-05-22 VITALS — BP 152/76 | HR 74 | Ht 64.0 in | Wt 153.4 lb

## 2016-05-22 DIAGNOSIS — Z0000 Encounter for general adult medical examination without abnormal findings: Secondary | ICD-10-CM

## 2016-05-22 DIAGNOSIS — E785 Hyperlipidemia, unspecified: Secondary | ICD-10-CM | POA: Diagnosis not present

## 2016-05-22 DIAGNOSIS — Z1231 Encounter for screening mammogram for malignant neoplasm of breast: Secondary | ICD-10-CM

## 2016-05-22 LAB — COMPREHENSIVE METABOLIC PANEL
ALBUMIN: 4.8 g/dL (ref 3.5–5.2)
ALK PHOS: 73 U/L (ref 39–117)
ALT: 33 U/L (ref 0–35)
AST: 28 U/L (ref 0–37)
BUN: 13 mg/dL (ref 6–23)
CHLORIDE: 101 meq/L (ref 96–112)
CO2: 29 mEq/L (ref 19–32)
Calcium: 10.1 mg/dL (ref 8.4–10.5)
Creatinine, Ser: 0.89 mg/dL (ref 0.40–1.20)
GFR: 65.57 mL/min (ref >60.00)
GLUCOSE: 115 mg/dL — AB (ref 70–99)
POTASSIUM: 3.8 meq/L (ref 3.5–5.1)
SODIUM: 138 meq/L (ref 135–145)
TOTAL PROTEIN: 7.7 g/dL (ref 6.0–8.3)
Total Bilirubin: 0.8 mg/dL (ref 0.2–1.2)

## 2016-05-22 LAB — LIPID PANEL
CHOL/HDL RATIO: 2
CHOLESTEROL: 163 mg/dL (ref 0–200)
HDL: 78.4 mg/dL (ref >39.00)
LDL CALC: 70 mg/dL (ref 0–99)
NonHDL: 84.25
TRIGLYCERIDES: 71 mg/dL (ref 0.0–149.0)
VLDL: 14.2 mg/dL (ref 0.0–40.0)

## 2016-05-22 MED ORDER — PANTOPRAZOLE SODIUM 40 MG PO TBEC: DELAYED_RELEASE_TABLET | ORAL | 3 refills | Status: DC

## 2016-05-22 NOTE — Progress Notes (Signed)
Subjective:    Patient ID: Mary Pacheco, female    DOB: 1941/02/18, 75 y.o.   MRN: [id number]  HPI  75YO female presents for annual exam.  Generally feeling well. Using accupuncture for joint pain.  No new concerns today.  Wt Readings from Last 3 Encounters:  05/22/16 153 lb 6.4 oz (69.6 kg)  02/21/16 153 lb 1.9 oz (69.5 kg)  10/11/15 153 lb 4 oz (69.5 kg)   BP Readings from Last 3 Encounters:  05/22/16 (!) 152/76  02/21/16 136/72  10/11/15 135/68    Past Medical History:  Diagnosis Date  . Anxiety 2011   patient with anxiety will continue Alprazolam prn  . Arthritis   . GERD (gastroesophageal reflux disease)    pt. with esophagea reflux. She reports great improvement with Dexilant.  [name] Hearing loss    s/p hearing aids  . 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 Hyperlipidemia    patient with HL on statins. lipids well controlled  . Hypertension    Family History  Problem Relation Age of Onset  . Heart failure Mother   . Heart disease Mother     CABG  . Hypertension Mother   . Hyperlipidemia Mother   . Heart attack Father   . Heart failure Father   . Hyperlipidemia Father   . Hypertension Father   . Heart attack Sister 39    MI   Past Surgical History:  Procedure Laterality Date  . ABDOMINAL HYSTERECTOMY  1993   multiple fibroid cysts  . cataract surgery  02/2011   bilateral  . TONSILLECTOMY     Social History   Social History  . Marital status: Married    Spouse name: N/A  . Number of children: N/A  . Years of education: N/A   Social History Main Topics  . Smoking status: Never Smoker  . Smokeless tobacco: Never Used  . Alcohol use Yes     Comment: socially  . Drug use: No  . Sexual activity: Not Asked   Other Topics Concern  . None   Social History Narrative  . None    Review of Systems  Constitutional: Negative for appetite change, chills, fatigue, fever and unexpected weight change.  Eyes: Negative for visual disturbance.  Respiratory:  Negative for cough, chest tightness and shortness of breath.   Cardiovascular: Negative for chest pain and leg swelling.  Gastrointestinal: Negative for abdominal pain, constipation, diarrhea, nausea and vomiting.  Musculoskeletal: Negative for arthralgias and myalgias.  Skin: Negative for color change and rash.  Neurological: Negative for weakness.  Hematological: Negative for adenopathy. Does not bruise/bleed easily.  Psychiatric/Behavioral: Negative for dysphoric mood and sleep disturbance. The patient is not nervous/anxious.        Objective:    BP (!) 152/76 (BP Location: Right Arm, Patient Position: Sitting, Cuff Size: Normal)   Pulse 74   Ht '5\' 4"'  (1.626 m)   Wt 153 lb 6.4 oz (69.6 kg)   SpO2 98%   BMI 26.33 kg/m  Physical Exam  Constitutional: She is oriented to person, place, and time. She appears well-developed and well-nourished. No distress.  HENT:  Head: Normocephalic and atraumatic.  Right Ear: External ear normal.  Left Ear: External ear normal.  Nose: Nose normal.  Mouth/Throat: Oropharynx is clear and moist. No oropharyngeal exudate.  Eyes: Conjunctivae are normal. Pupils are equal, round, and reactive to light. Right eye exhibits no discharge. Left eye exhibits no discharge. No scleral icterus.  Neck: Normal range  of motion. Neck supple. No tracheal deviation present. No thyromegaly present.  Cardiovascular: Normal rate, regular rhythm, normal heart sounds and intact distal pulses.  Exam reveals no gallop and no friction rub.   No murmur heard. Pulmonary/Chest: Effort normal and breath sounds normal. No accessory muscle usage. No tachypnea. No respiratory distress. She has no decreased breath sounds. She has no wheezes. She has no rales. She exhibits no tenderness. Right breast exhibits no inverted nipple, no mass, no nipple discharge, no skin change and no tenderness. Left breast exhibits no inverted nipple, no mass, no nipple discharge, no skin change and no  tenderness. Breasts are symmetrical.  Abdominal: Soft. Bowel sounds are normal. She exhibits no distension and no mass. There is no tenderness. There is no rebound and no guarding.  Musculoskeletal: Normal range of motion. She exhibits no edema or tenderness.  Lymphadenopathy:    She has no cervical adenopathy.  Neurological: She is alert and oriented to person, place, and time. No cranial nerve deficit. She exhibits normal muscle tone. Coordination normal.  Skin: Skin is warm and dry. No rash noted. She is not diaphoretic. No erythema. No pallor.  Psychiatric: She has a normal mood and affect. Her behavior is normal. Judgment and thought content normal.          Assessment & Plan:   Problem List Items Addressed This Visit      Unprioritized   Hyperlipidemia   Relevant Orders   Lipid panel   Comp Met (CMET)   Routine general medical examination at a health care facility - Primary    General medical exam including breast exam normal today. PAP and pelvic deferred given age and preference. Labs ordered. Colonoscopy UTD. Mammogram ordered. Bone density testing ordered. Immunizations UTD. Encouraged healthy diet and exercise.      Relevant Orders   DG Bone Density    Other Visit Diagnoses    Visit for screening mammogram       Relevant Orders   MM Digital Screening       Return in about 6 months (around 11/22/2016) for New Patient.  Ronette Deter, MD [facility]

## 2016-05-22 NOTE — Assessment & Plan Note (Signed)
General medical exam including breast exam normal today. PAP and pelvic deferred given age and preference. Labs ordered. Colonoscopy UTD. Mammogram ordered. Bone density testing ordered. Immunizations UTD. Encouraged healthy diet and exercise.

## 2016-05-22 NOTE — Patient Instructions (Signed)
Health Maintenance, Female Adopting a healthy lifestyle and getting preventive care can go a long way to promote health and wellness. Talk with your health care provider about what schedule of regular examinations is right for you. This is a good chance for you to check in with your provider about disease prevention and staying healthy. In between checkups, there are plenty of things you can do on your own. Experts have done a lot of research about which lifestyle changes and preventive measures are most likely to keep you healthy. Ask your health care provider for more information. WEIGHT AND DIET  Eat a healthy diet  Be sure to include plenty of vegetables, fruits, low-fat dairy products, and lean protein.  Do not eat a lot of foods high in solid fats, added sugars, or salt.  Get regular exercise. This is one of the most important things you can do for your health.  Most adults should exercise for at least 150 minutes each week. The exercise should increase your heart rate and make you sweat (moderate-intensity exercise).  Most adults should also do strengthening exercises at least twice a week. This is in addition to the moderate-intensity exercise.  Maintain a healthy weight  Body mass index (BMI) is a measurement that can be used to identify possible weight problems. It estimates body fat based on height and weight. Your health care provider can help determine your BMI and help you achieve or maintain a healthy weight.  For females 20 years of age and older:   A BMI below 18.5 is considered underweight.  A BMI of 18.5 to 24.9 is normal.  A BMI of 25 to 29.9 is considered overweight.  A BMI of 30 and above is considered obese.  Watch levels of cholesterol and blood lipids  You should start having your blood tested for lipids and cholesterol at 76 years of age, then have this test every 5 years.  You may need to have your cholesterol levels checked more often if:  Your lipid  or cholesterol levels are high.  You are older than 76 years of age.  You are at high risk for heart disease.  CANCER SCREENING   Lung Cancer  Lung cancer screening is recommended for adults 55-80 years old who are at high risk for lung cancer because of a history of smoking.  A yearly low-dose CT scan of the lungs is recommended for people who:  Currently smoke.  Have quit within the past 15 years.  Have at least a 30-pack-year history of smoking. A pack year is smoking an average of one pack of cigarettes a day for 1 year.  Yearly screening should continue until it has been 15 years since you quit.  Yearly screening should stop if you develop a health problem that would prevent you from having lung cancer treatment.  Breast Cancer  Practice breast self-awareness. This means understanding how your breasts normally appear and feel.  It also means doing regular breast self-exams. Let your health care provider know about any changes, no matter how small.  If you are in your 20s or 30s, you should have a clinical breast exam (CBE) by a health care provider every 1-3 years as part of a regular health exam.  If you are 40 or older, have a CBE every year. Also consider having a breast X-ray (mammogram) every year.  If you have a family history of breast cancer, talk to your health care provider about genetic screening.  If you   are at high risk for breast cancer, talk to your health care provider about having an MRI and a mammogram every year.  Breast cancer gene (BRCA) assessment is recommended for women who have family members with BRCA-related cancers. BRCA-related cancers include:  Breast.  Ovarian.  Tubal.  Peritoneal cancers.  Results of the assessment will determine the need for genetic counseling and BRCA1 and BRCA2 testing. Cervical Cancer Your health care provider may recommend that you be screened regularly for cancer of the pelvic organs (ovaries, uterus, and  vagina). This screening involves a pelvic examination, including checking for microscopic changes to the surface of your cervix (Pap test). You may be encouraged to have this screening done every 3 years, beginning at age 21.  For women ages 30-65, health care providers may recommend pelvic exams and Pap testing every 3 years, or they may recommend the Pap and pelvic exam, combined with testing for human papilloma virus (HPV), every 5 years. Some types of HPV increase your risk of cervical cancer. Testing for HPV may also be done on women of any age with unclear Pap test results.  Other health care providers may not recommend any screening for nonpregnant women who are considered low risk for pelvic cancer and who do not have symptoms. Ask your health care provider if a screening pelvic exam is right for you.  If you have had past treatment for cervical cancer or a condition that could lead to cancer, you need Pap tests and screening for cancer for at least 20 years after your treatment. If Pap tests have been discontinued, your risk factors (such as having a new sexual partner) need to be reassessed to determine if screening should resume. Some women have medical problems that increase the chance of getting cervical cancer. In these cases, your health care provider may recommend more frequent screening and Pap tests. Colorectal Cancer  This type of cancer can be detected and often prevented.  Routine colorectal cancer screening usually begins at 76 years of age and continues through 75 years of age.  Your health care provider may recommend screening at an earlier age if you have risk factors for colon cancer.  Your health care provider may also recommend using home test kits to check for hidden blood in the stool.  A small camera at the end of a tube can be used to examine your colon directly (sigmoidoscopy or colonoscopy). This is done to check for the earliest forms of colorectal  cancer.  Routine screening usually begins at age 50.  Direct examination of the colon should be repeated every 5-10 years through 75 years of age. However, you may need to be screened more often if early forms of precancerous polyps or small growths are found. Skin Cancer  Check your skin from head to toe regularly.  Tell your health care provider about any new moles or changes in moles, especially if there is a change in a mole's shape or color.  Also tell your health care provider if you have a mole that is larger than the size of a pencil eraser.  Always use sunscreen. Apply sunscreen liberally and repeatedly throughout the day.  Protect yourself by wearing long sleeves, pants, a wide-brimmed hat, and sunglasses whenever you are outside. HEART DISEASE, DIABETES, AND HIGH BLOOD PRESSURE   High blood pressure causes heart disease and increases the risk of stroke. High blood pressure is more likely to develop in:  People who have blood pressure in the high end   of the normal range (130-139/85-89 mm Hg).  People who are overweight or obese.  People who are African American.  If you are 38-23 years of age, have your blood pressure checked every 3-5 years. If you are 61 years of age or older, have your blood pressure checked every year. You should have your blood pressure measured twice--once when you are at a hospital or clinic, and once when you are not at a hospital or clinic. Record the average of the two measurements. To check your blood pressure when you are not at a hospital or clinic, you can use:  An automated blood pressure machine at a pharmacy.  A home blood pressure monitor.  If you are between 45 years and 39 years old, ask your health care provider if you should take aspirin to prevent strokes.  Have regular diabetes screenings. This involves taking a blood sample to check your fasting blood sugar level.  If you are at a normal weight and have a low risk for diabetes,  have this test once every three years after 76 years of age.  If you are overweight and have a high risk for diabetes, consider being tested at a younger age or more often. PREVENTING INFECTION  Hepatitis B  If you have a higher risk for hepatitis B, you should be screened for this virus. You are considered at high risk for hepatitis B if:  You were born in a country where hepatitis B is common. Ask your health care provider which countries are considered high risk.  Your parents were born in a high-risk country, and you have not been immunized against hepatitis B (hepatitis B vaccine).  You have HIV or AIDS.  You use needles to inject street drugs.  You live with someone who has hepatitis B.  You have had sex with someone who has hepatitis B.  You get hemodialysis treatment.  You take certain medicines for conditions, including cancer, organ transplantation, and autoimmune conditions. Hepatitis C  Blood testing is recommended for:  Everyone born from [birth year] through 1966.  Anyone with known risk factors for hepatitis C. Sexually transmitted infections (STIs)  You should be screened for sexually transmitted infections (STIs) including gonorrhea and chlamydia if:  You are sexually active and are younger than 76 years of age.  You are older than 76 years of age and your health care provider tells you that you are at risk for this type of infection.  Your sexual activity has changed since you were last screened and you are at an increased risk for chlamydia or gonorrhea. Ask your health care provider if you are at risk.  If you do not have HIV, but are at risk, it may be recommended that you take a prescription medicine daily to prevent HIV infection. This is called pre-exposure prophylaxis (PrEP). You are considered at risk if:  You are sexually active and do not regularly use condoms or know the HIV status of your partner(s).  You take drugs by injection.  You are sexually  active with a partner who has HIV. Talk with your health care provider about whether you are at high risk of being infected with HIV. If you choose to begin PrEP, you should first be tested for HIV. You should then be tested every 3 months for as long as you are taking PrEP.  PREGNANCY   If you are premenopausal and you may become pregnant, ask your health care provider about preconception counseling.  If you may  become pregnant, take 400 to 800 micrograms (mcg) of folic acid every day.  If you want to prevent pregnancy, talk to your health care provider about birth control (contraception). OSTEOPOROSIS AND MENOPAUSE   Osteoporosis is a disease in which the bones lose minerals and strength with aging. This can result in serious bone fractures. Your risk for osteoporosis can be identified using a bone density scan.  If you are 61 years of age or older, or if you are at risk for osteoporosis and fractures, ask your health care provider if you should be screened.  Ask your health care provider whether you should take a calcium or vitamin D supplement to lower your risk for osteoporosis.  Menopause may have certain physical symptoms and risks.  Hormone replacement therapy may reduce some of these symptoms and risks. Talk to your health care provider about whether hormone replacement therapy is right for you.  HOME CARE INSTRUCTIONS   Schedule regular health, dental, and eye exams.  Stay current with your immunizations.   Do not use any tobacco products including cigarettes, chewing tobacco, or electronic cigarettes.  If you are pregnant, do not drink alcohol.  If you are breastfeeding, limit how much and how often you drink alcohol.  Limit alcohol intake to no more than 1 drink per day for nonpregnant women. One drink equals 12 ounces of beer, 5 ounces of wine, or 1 ounces of hard liquor.  Do not use street drugs.  Do not share needles.  Ask your health care provider for help if  you need support or information about quitting drugs.  Tell your health care provider if you often feel depressed.  Tell your health care provider if you have ever been abused or do not feel safe at home.   This information is not intended to replace advice given to you by your health care provider. Make sure you discuss any questions you have with your health care provider.   Document Released: 04/23/2011 Document Revised: 10/29/2014 Document Reviewed: 09/09/2013 Elsevier Interactive Patient Education [employer].

## 2016-05-22 NOTE — Progress Notes (Signed)
Pre visit review using our clinic review tool, if applicable. No additional management support is needed unless otherwise documented below in the visit note. 

## 2016-05-24 ENCOUNTER — Encounter: Payer: Self-pay | Admitting: Cardiovascular Disease

## 2016-05-24 ENCOUNTER — Ambulatory Visit (INDEPENDENT_AMBULATORY_CARE_PROVIDER_SITE_OTHER): Payer: Medicare Other | Admitting: Cardiovascular Disease

## 2016-05-24 VITALS — BP 130/80 | HR 65 | Ht 64.0 in | Wt 153.2 lb

## 2016-05-24 DIAGNOSIS — E663 Overweight: Secondary | ICD-10-CM

## 2016-05-24 DIAGNOSIS — I6523 Occlusion and stenosis of bilateral carotid arteries: Secondary | ICD-10-CM | POA: Diagnosis not present

## 2016-05-24 DIAGNOSIS — I10 Essential (primary) hypertension: Secondary | ICD-10-CM

## 2016-05-24 DIAGNOSIS — E785 Hyperlipidemia, unspecified: Secondary | ICD-10-CM | POA: Diagnosis not present

## 2016-05-24 NOTE — Progress Notes (Signed)
Cardiology Office Note  Date:  05/24/2016   ID:  ALEC DIPERNA, DOB 1940/11/19, MRN [id number]  PCP:  Rica Mast, MD   Chief Complaint  Patient presents with  . Other    1 yr f/u c/o chest discomfort with exertion. Meds reviewed verbally with pt.    HPI:  75 year old female with history of hypertension, hyperlipidemia, bilateral carotid arterial disease, worse on the right than the left, estimated at 60-69% on the right, 50% on the left, history of anxiety presenting for routine followup of her PAD  In follow-up today, we discussed her previous CT coronary calcium score CT coronary score of 5 Lipids total chol 163, ldl 70 Tried lipitor 40 had side effects , pain in her hands Carotids followed by vein and vascular By her report, there has been no significant progression  She was previously taking Celebrex for arthritis pain Long discussion concerning her cholesterol. Was previously taking Crestor 5 mg daily but changed for insurance reasons. She was changed from simvastatin 20 mg daily (had myalgias on higher dose). Most recent cholesterol on simvastatin was in the 180 range.   Overall she feels well with no complaints  EKG on today's visit shows normal sinus rhythm with rate 65 bpm, no significant ST or T-wave changes  She denies any smoking history, no diabetes. She tries to watch what she eats  Other past medical history She does report having history of GI ulcer/bleeding in 2003 which she attributes to NSAIDs which she was taking for arthritis. She recently switched from Nexium to Protonix. She does have some discomfort in her chest in the morning when she wakes up which she attributes to GERD.  PMH:   has a past medical history of Anxiety (2011); Arthritis; GERD (gastroesophageal reflux disease); Hearing loss; Herpes (02/20/2010); Hyperlipidemia; and Hypertension.  PSH:    Past Surgical History:  Procedure Laterality Date  . ABDOMINAL HYSTERECTOMY  1993   multiple fibroid cysts  . cataract surgery  02/2011   bilateral  . TONSILLECTOMY      Current Outpatient Prescriptions  Medication Sig Dispense Refill  . acyclovir (ZOVIRAX) 400 MG tablet TAKE 1 TABLET (400 MG TOTAL) BY MOUTH DAILY. 90 tablet 1  . ALPRAZolam (XANAX) 0.25 MG tablet TAKE 1 TABLET BY MOUTH 3 TIMES A DAY AS NEEDED FOR ANXIETY 90 tablet 0  . aspirin 81 MG tablet Take 81 mg by mouth daily.      [name] atorvastatin (LIPITOR) 20 MG tablet Take 1 tablet (20 mg total) by mouth daily. 90 tablet 3  . celecoxib (CELEBREX) 200 MG capsule TAKE 1 CAPSULE (200 MG TOTAL) BY MOUTH 2 (TWO) TIMES DAILY. 60 capsule 2  . cholecalciferol (VITAMIN D) 1000 UNITS tablet Take 1,000 Units by mouth daily.      . Coenzyme Q10 (EQL COQ10) 300 MG CAPS Take by mouth.    . CRANBERRY PO Take by mouth.    . Flaxseed, Linseed, (FLAX SEED OIL PO) Take 1,400 mg by mouth daily.    . hydrochlorothiazide (MICROZIDE) 12.5 MG capsule TAKE 1 CAPSULE DAILY 90 capsule 2  . levothyroxine (SYNTHROID, LEVOTHROID) 50 MCG tablet TAKE 1 TABLET DAILY 90 tablet 2  . Misc Natural Products (TOTAL CARDIO HEALTH FORMULA PO) Take 1 tablet by mouth daily.      . Omega-3 Fatty Acids (FISH OIL TRIPLE STRENGTH) 1400 MG CAPS Take by mouth.    . pantoprazole (PROTONIX) 40 MG tablet TAKE 1 TABLET (40 MG TOTAL) BY MOUTH DAILY. 90 tablet  3  . Probiotic Product (PROBIOTIC DAILY PO) Take by mouth.    [name] SINGULAIR 10 MG tablet Take 1 tablet (10 mg total) by mouth at bedtime. 90 tablet 3  . triamcinolone ointment (KENALOG) 0.1 % APPLY THIN FILM TO AFFECTED AREAS BY TOPICAL ROUTE AS DIRECTED FOR 7 DAYS AS NEEDED 30 g 1   No current facility-administered medications for this visit.      Allergies:   Molds & smuts; Rosuvastatin; and Simvastatin   Social History:  The patient  reports that she has never smoked. She has never used smokeless tobacco. She reports that she drinks alcohol. She reports that she does not use drugs.   Family History:    family history includes Heart attack in her father; Heart attack (age of onset: 3) in her sister; Heart disease in her mother; Heart failure in her father and mother; Hyperlipidemia in her father and mother; Hypertension in her father and mother.    Review of Systems: Review of Systems  Constitutional: Negative.   Respiratory: Negative.   Cardiovascular: Negative.   Gastrointestinal: Negative.   Musculoskeletal: Negative.   Neurological: Negative.   Psychiatric/Behavioral: Negative.   All other systems reviewed and are negative.    PHYSICAL EXAM: VS:  BP 130/80 (BP Location: Left Arm, Patient Position: Sitting, Cuff Size: Normal)   Pulse 65   Ht 5\' 4"  (1.626 m)   Wt 153 lb 4 oz (69.5 kg)   BMI 26.31 kg/m  , BMI Body mass index is 26.31 kg/m. GEN: Well nourished, well developed, in no acute distress  HEENT: normal  Neck: no JVD, carotid bruits, or masses Cardiac: RRR; no murmurs, rubs, or gallops,no edema  Respiratory:  clear to auscultation bilaterally, normal work of breathing GI: soft, nontender, nondistended, + BS MS: no deformity or atrophy  Skin: warm and dry, no rash Neuro:  Strength and sensation are intact Psych: euthymic mood, full affect    Recent Labs: 10/11/2015: Magnesium 2.5 02/21/2016: TSH 1.57 05/22/2016: ALT 33; BUN 13; Creatinine, Ser 0.89; Potassium 3.8; Sodium 138    Lipid Panel Lab Results  Component Value Date   CHOL 163 05/22/2016   HDL 78.40 05/22/2016   LDLCALC 70 05/22/2016   TRIG 71.0 05/22/2016      Wt Readings from Last 3 Encounters:  05/24/16 153 lb 4 oz (69.5 kg)  05/22/16 153 lb 6.4 oz (69.6 kg)  02/21/16 153 lb 1.9 oz (69.5 kg)       ASSESSMENT AND PLAN:  Essential hypertension - Plan: EKG 12-Lead Blood pressure is well controlled on today's visit. No changes made to the medications.  Hyperlipidemia Long discussion concerning her cholesterol management Options include continuing Lipitor 20 mg daily Currently  cholesterol above goal and given significant carotid disease, we have recommended a more aggressive regiment. Other options include increasing Lipitor up to 40 mg daily but this causes hand pain. Could change to Crestor 20 mg daily now this is generic. Could add zetia 10 mg daily and stay Lipitor 20 For now she would like to take Lipitor 30 mg daily and recheck her cholesterol in several months time on routine follow-up with primary care  Carotid stenosis, bilateral 60% carotid disease on previous ultrasound Stressed importance of aggressive cholesterol management  Overweight (BMI 25.0-29.9) Diet and exercise program discussed with her   Total encounter time more than 25 minutes  Greater than 50% was spent in counseling and coordination of care with the patient   Disposition:   F/U  6 months   Orders Placed This Encounter  Procedures  . EKG 12-Lead     Signed, Esmond Plants, M.D., Ph.D. 05/24/2016  [place], [place]

## 2016-05-24 NOTE — Patient Instructions (Addendum)
Medication Instructions:   For cholesterol, You could try lipitor 30 daily You could try crestor 20 mg daily (www.goodrx.com) You could add zetia 10 mg daily with lipitor 20 mg daily Or leave it be  Labwork:  No new labs  Testing/Procedures:  No further testing  Follow-Up: It was a pleasure seeing you in the office today. Please call us if you have new issues that need to be addressed before your next appt.  [phone]  Your physician wants you to follow-up in: 12 months.  You will receive a reminder letter in the mail two months in advance. If you don't receive a letter, please call our office to schedule the follow-up appointment.  If you need a refill on your cardiac medications before your next appointment, please call your pharmacy.

## 2016-06-04 LAB — HM DEXA SCAN

## 2016-06-05 ENCOUNTER — Telehealth: Payer: Self-pay | Admitting: Cardiovascular Disease

## 2016-06-05 NOTE — Telephone Encounter (Signed)
Pt asks if the statin that Dr. Rockey Situ has increased could be causing muscle spasms in her chest. States these spasms started about 3 days ago.  One was very mild 3 days ago, and another yesterday. please

## 2016-06-05 NOTE — Telephone Encounter (Signed)
Spoke w/ pt.  She reports that she previously had chest muscle spasms years ago while on a statin and is having similar sx since increasing her Lipitor from 10 mg to 30 mg. Reports that sx last only about 5 seconds, then resolve on their own. Advised her to reduce Lipitor back to previous dose of 10 mg daily and see if her sx resolve.  Asked her to call back at the end of the week and let me know how she is doing, as Dr. Rockey Situ gave several options at her last ov:  "For cholesterol, You could try lipitor 30 daily You could try crestor 20 mg daily (www.goodrx.com) You could add zetia 10 mg daily with lipitor 20 mg daily Or leave it be"

## 2016-06-06 ENCOUNTER — Telehealth: Payer: Self-pay | Admitting: Family

## 2016-06-06 NOTE — Telephone Encounter (Signed)
Call patient and ensure that she got her latest DEXA scan for bone density. The femoral densities indicate osteopenia but the spine to see is normal. I see that she takes vitamin D. Please continue taking vitamin D, and I would also add calcium supplementation to her diet.  Mary Pacheco

## 2016-06-06 NOTE — Telephone Encounter (Signed)
Patient was informed of results.  Patient understood and no questions, comments, or concerns at this time.  

## 2016-06-13 NOTE — Telephone Encounter (Signed)
Pt calling just to give [place] an update She states she is doing fine Cramping stopped about a day later  Has gone back to 20 mg of Atorvastatin   If we have any questions please call back

## 2016-06-13 NOTE — Telephone Encounter (Signed)
Left detailed voicemail message confirming that I received her update and instructed to call back if she has any additional needs.

## 2016-06-26 ENCOUNTER — Encounter: Payer: Self-pay | Admitting: Internal Medicine

## 2016-07-03 ENCOUNTER — Other Ambulatory Visit: Payer: Self-pay

## 2016-07-03 NOTE — Telephone Encounter (Signed)
Patients is requesting a refill of Genital herpes medication. Please advise. Last OV [id number]

## 2016-07-05 ENCOUNTER — Telehealth: Payer: Self-pay

## 2016-07-05 MED ORDER — ACYCLOVIR 400 MG PO TABS: ORAL_TABLET | ORAL | 1 refills | Status: DC

## 2016-07-05 NOTE — Telephone Encounter (Signed)
You may refill, thanks

## 2016-07-05 NOTE — Telephone Encounter (Signed)
Medication refill

## 2016-07-05 NOTE — Telephone Encounter (Signed)
Refill request for ACYCLOVIR, last seen [id number], last filled [id number].  Please advise.

## 2016-07-05 NOTE — Addendum Note (Signed)
Addended by: Elpidio Galea T on: 07/05/2016 11:58 AM   Modules accepted: Orders

## 2016-07-12 ENCOUNTER — Telehealth: Payer: Self-pay | Admitting: Internal Medicine

## 2016-07-12 NOTE — Telephone Encounter (Signed)
Left pt a vm to call office to sch flu shot. Thank you!

## 2016-07-18 ENCOUNTER — Ambulatory Visit (INDEPENDENT_AMBULATORY_CARE_PROVIDER_SITE_OTHER): Payer: Medicare Other

## 2016-07-18 DIAGNOSIS — Z23 Encounter for immunization: Secondary | ICD-10-CM | POA: Diagnosis not present

## 2016-07-19 ENCOUNTER — Ambulatory Visit: Payer: Medicare Other

## 2016-07-19 ENCOUNTER — Other Ambulatory Visit: Payer: Medicare Other

## 2016-08-23 ENCOUNTER — Encounter: Payer: Medicare Other | Admitting: Internal Medicine

## 2016-08-27 ENCOUNTER — Telehealth: Payer: Self-pay | Admitting: *Deleted

## 2016-08-27 MED ORDER — HYDROCHLOROTHIAZIDE 12.5 MG PO CAPS: 12.5000 mg | ORAL_CAPSULE | Freq: Every day | ORAL | 2 refills | Status: DC

## 2016-08-27 NOTE — Telephone Encounter (Signed)
Medication has been refilled.

## 2016-08-27 NOTE — Telephone Encounter (Signed)
Pt requested to have her hydrochlorothiazide refilled  [facility]

## 2016-09-04 ENCOUNTER — Telehealth: Payer: Self-pay | Admitting: Cardiovascular Disease

## 2016-09-04 NOTE — Telephone Encounter (Signed)
Spoke w/ pt.  She reports some chest discomfort for the past 2-3 weeks.  She denies chest pain, SOB, n/v or sweating.  Sx are present after she eats; her favorite foods involve chocolate & onions and these both cause heartburn. She states that pressing on her sternum seems to relieve sx a bit.  She previously took Nexium w/ good results, but her ins no longer covers, so she is on the generic of Protonix. Advised her to try a homeopathic remedy of a spoonful of yellow mustard.  She will call her PCP if sx persist after adjusting her diet.

## 2016-09-04 NOTE — Telephone Encounter (Signed)
Pt calling stating she would like a call back  Having some "uneasy feeling in chest"  It has been going on and off for about four days.  Pt c/o of Chest Pain: STAT if CP now or developed within 24 hours  1. Are you having CP right now? No   2. Are you experiencing any other symptoms (ex. SOB, nausea, vomiting, sweating)? Almost like it only effects her strength   3. How long have you been experiencing CP? About 4 days  4. Is your CP continuous or coming and going? Coming and going   5. Have you taken Nitroglycerin? No   Has taken half of a Xanax. Thinks it may be stress but would be something else just would like to make sure she is okay Please advise.  ?

## 2016-10-29 ENCOUNTER — Encounter (INDEPENDENT_AMBULATORY_CARE_PROVIDER_SITE_OTHER): Payer: Self-pay | Admitting: Vascular Surgery

## 2016-10-29 ENCOUNTER — Encounter (INDEPENDENT_AMBULATORY_CARE_PROVIDER_SITE_OTHER): Payer: Medicare Other

## 2016-10-29 ENCOUNTER — Ambulatory Visit (INDEPENDENT_AMBULATORY_CARE_PROVIDER_SITE_OTHER): Payer: Medicare Other | Admitting: Vascular Surgery

## 2016-10-29 ENCOUNTER — Other Ambulatory Visit (INDEPENDENT_AMBULATORY_CARE_PROVIDER_SITE_OTHER): Payer: Self-pay | Admitting: Vascular Surgery

## 2016-10-29 VITALS — BP 151/70 | HR 71 | Resp 17 | Ht 64.0 in | Wt 151.0 lb

## 2016-10-29 DIAGNOSIS — I6523 Occlusion and stenosis of bilateral carotid arteries: Secondary | ICD-10-CM

## 2016-10-29 DIAGNOSIS — E785 Hyperlipidemia, unspecified: Secondary | ICD-10-CM

## 2016-10-29 DIAGNOSIS — I10 Essential (primary) hypertension: Secondary | ICD-10-CM

## 2016-10-29 LAB — VAS US CAROTID
LCCADDIAS: -17 cm/s
LCCADSYS: -86 cm/s
LCCAPDIAS: 20 cm/s
LCCAPSYS: 138 cm/s
LEFT ECA DIAS: -11 cm/s
LICADDIAS: -24 cm/s
LICADSYS: -115 cm/s
LICAPSYS: 86 cm/s
Left ICA prox dias: 18 cm/s
RCCAPSYS: 104 cm/s
RIGHT CCA MID DIAS: 17 cm/s
RIGHT ECA DIAS: 11 cm/s
Right CCA prox dias: 13 cm/s
Right cca dist sys: -126 cm/s

## 2016-10-30 NOTE — Progress Notes (Signed)
Subjective:    Patient ID: Mary Pacheco, female    DOB: 10/12/1941, 76 y.o.   MRN: [id number] Chief Complaint  Patient presents with  . Follow-up   Patient presents for a yearly non-invasive study follow up for carotid stenosis. The stenosis has been followed by surveillance duplexes. The patient underwent a bilateral carotid duplex scan which showed no change from the previous exam on 10/06/15. Duplex is stable at Right ICA stenosis (40-59%) and Left ICA stenosis (1-39%). The patient denies experiencing Amaurosis Fugax, TIA like symptoms or focal motor deficits.    Review of Systems  Constitutional: Negative.   HENT: Negative.   Eyes: Negative.   Respiratory: Negative.   Cardiovascular: Negative.   Gastrointestinal: Negative.   Endocrine: Negative.   Genitourinary: Negative.   Musculoskeletal: Negative.   Skin: Negative.   Allergic/Immunologic: Negative.   Neurological: Negative.   Hematological: Negative.   Psychiatric/Behavioral: Negative.       Objective:   Physical Exam  Constitutional: She is oriented to person, place, and time. She appears well-developed and well-nourished.  HENT:  Head: Normocephalic and atraumatic.  Right Ear: External ear normal.  Left Ear: External ear normal.  Eyes: Conjunctivae and EOM are normal. Pupils are equal, round, and reactive to light.  Neck: Normal range of motion.  Cardiovascular: Normal rate, regular rhythm, normal heart sounds and intact distal pulses.   Pulses:      Radial pulses are 2+ on the right side, and 2+ on the left side.       Dorsalis pedis pulses are 2+ on the right side, and 2+ on the left side.       Posterior tibial pulses are 2+ on the right side, and 2+ on the left side.  Right Carotid Bruit Noted.   Pulmonary/Chest: Effort normal and breath sounds normal.  Abdominal: Soft. Bowel sounds are normal.  Musculoskeletal: Normal range of motion. She exhibits no edema.  Neurological: She is alert and oriented to  person, place, and time.  Skin: Skin is warm and dry.  Psychiatric: She has a normal mood and affect. Her behavior is normal. Judgment and thought content normal.   BP (!) 151/70 (BP Location: Left Arm)   Pulse 71   Resp 17   Ht 5\' 4"  (1.626 m)   Wt 151 lb (68.5 kg)   BMI 25.92 kg/m   Past Medical History:  Diagnosis Date  . Anxiety 2011   patient with anxiety will continue Alprazolam prn  . Arthritis   . GERD (gastroesophageal reflux disease)    pt. with esophagea reflux. She reports great improvement with Dexilant.  [name] Hearing loss    s/p hearing aids  . 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 Hyperlipidemia    patient with HL on statins. lipids well controlled  . Hypertension    Social History   Social History  . Marital status: Married    Spouse name: N/A  . Number of children: N/A  . Years of education: N/A   Occupational History  . Not on file.   Social History Main Topics  . Smoking status: Never Smoker  . Smokeless tobacco: Never Used  . Alcohol use Yes     Comment: socially  . Drug use: No  . Sexual activity: Not on file   Other Topics Concern  . Not on file   Social History Narrative  . No narrative on file   Past Surgical History:  Procedure Laterality Date  . ABDOMINAL HYSTERECTOMY  1993   multiple fibroid cysts  . cataract surgery  02/2011   bilateral  . TONSILLECTOMY     Family History  Problem Relation Age of Onset  . Heart failure Mother   . Heart disease Mother     CABG  . Hypertension Mother   . Hyperlipidemia Mother   . Heart attack Father   . Heart failure Father   . Hyperlipidemia Father   . Hypertension Father   . Heart attack Sister 22    MI   Allergies  Allergen Reactions  . Molds & Smuts   . Rosuvastatin Hives  . Simvastatin     myalgia      Assessment & Plan:  Patient presents for a yearly non-invasive study follow up for carotid stenosis. The stenosis has been followed by surveillance duplexes. The patient underwent a  bilateral carotid duplex scan which showed no change from the previous exam on 10/06/15. Duplex is stable at Right ICA stenosis (40-59%) and Left ICA stenosis (1-39%). The patient denies experiencing Amaurosis Fugax, TIA like symptoms or focal motor deficits.   1. Bilateral carotid artery stenosis - Stable Studies reviewed with patient. Patient asymptomatic with stable duplex. No intervention at this time. Patient to return in one year for surveillance carotid duplex. Patient to continue medical optimization with ASA and dyslipidemia medication. Patient to remain abstinent of tobacco use. I have discussed with the patient at length the risk factors for and pathogenesis of atherosclerotic disease and encouraged a healthy diet, regular exercise regimen and blood pressure / glucose control.  Patient was instructed to contact our office in the interim with problems such as arm / leg weakness or numbness, speech / swallowing difficulty or temporary monocular blindness. The patient expresses their understanding. - VAS US CAROTID; Future  2. Hyperlipidemia, unspecified hyperlipidemia type - Stable Encouraged good control as its slows the progression of atherosclerotic disease  3. Essential hypertension - Stable Encouraged good control as its slows the progression of atherosclerotic disease  Current Outpatient Prescriptions on File Prior to Visit  Medication Sig Dispense Refill  . acyclovir (ZOVIRAX) 400 MG tablet TAKE 1 TABLET (400 MG TOTAL) BY MOUTH DAILY. 90 tablet 1  . ALPRAZolam (XANAX) 0.25 MG tablet TAKE 1 TABLET BY MOUTH 3 TIMES A DAY AS NEEDED FOR ANXIETY 90 tablet 0  . aspirin 81 MG tablet Take 81 mg by mouth daily.      [name] atorvastatin (LIPITOR) 20 MG tablet Take 1 tablet (20 mg total) by mouth daily. 90 tablet 3  . celecoxib (CELEBREX) 200 MG capsule TAKE 1 CAPSULE (200 MG TOTAL) BY MOUTH 2 (TWO) TIMES DAILY. 60 capsule 2  . cholecalciferol (VITAMIN D) 1000 UNITS tablet Take 1,000 Units  by mouth daily.      . Coenzyme Q10 (EQL COQ10) 300 MG CAPS Take by mouth.    . CRANBERRY PO Take by mouth.    . Flaxseed, Linseed, (FLAX SEED OIL PO) Take 1,400 mg by mouth daily.    . hydrochlorothiazide (MICROZIDE) 12.5 MG capsule Take 1 capsule (12.5 mg total) by mouth daily. 90 capsule 2  . levothyroxine (SYNTHROID, LEVOTHROID) 50 MCG tablet TAKE 1 TABLET DAILY 90 tablet 2  . Misc Natural Products (TOTAL CARDIO HEALTH FORMULA PO) Take 1 tablet by mouth daily.      . Omega-3 Fatty Acids (FISH OIL TRIPLE STRENGTH) 1400 MG CAPS Take by mouth.    . pantoprazole (PROTONIX) 40 MG tablet TAKE 1 TABLET (40 MG TOTAL) BY MOUTH DAILY.  90 tablet 3  . Probiotic Product (PROBIOTIC DAILY PO) Take by mouth.    [name] SINGULAIR 10 MG tablet Take 1 tablet (10 mg total) by mouth at bedtime. 90 tablet 3  . triamcinolone ointment (KENALOG) 0.1 % APPLY THIN FILM TO AFFECTED AREAS BY TOPICAL ROUTE AS DIRECTED FOR 7 DAYS AS NEEDED 30 g 1   No current facility-administered medications on file prior to visit.     There are no Patient Instructions on file for this visit. No Follow-up on file.   Longino Trefz A Modesto Ganoe, PA-C

## 2016-10-31 ENCOUNTER — Ambulatory Visit (INDEPENDENT_AMBULATORY_CARE_PROVIDER_SITE_OTHER): Payer: Medicare Other | Admitting: Family

## 2016-10-31 ENCOUNTER — Ambulatory Visit: Payer: Medicare Other

## 2016-10-31 DIAGNOSIS — Z0289 Encounter for other administrative examinations: Secondary | ICD-10-CM

## 2016-11-22 ENCOUNTER — Ambulatory Visit (INDEPENDENT_AMBULATORY_CARE_PROVIDER_SITE_OTHER): Payer: Medicare Other | Admitting: Family

## 2016-11-22 ENCOUNTER — Encounter: Payer: Self-pay | Admitting: Family

## 2016-11-22 VITALS — BP 138/78 | HR 77 | Temp 97.8°F | Ht 64.0 in | Wt 152.2 lb

## 2016-11-22 DIAGNOSIS — E039 Hypothyroidism, unspecified: Secondary | ICD-10-CM

## 2016-11-22 DIAGNOSIS — M8580 Other specified disorders of bone density and structure, unspecified site: Secondary | ICD-10-CM

## 2016-11-22 DIAGNOSIS — K219 Gastro-esophageal reflux disease without esophagitis: Secondary | ICD-10-CM | POA: Diagnosis not present

## 2016-11-22 DIAGNOSIS — Z0000 Encounter for general adult medical examination without abnormal findings: Secondary | ICD-10-CM | POA: Diagnosis not present

## 2016-11-22 DIAGNOSIS — F419 Anxiety disorder, unspecified: Secondary | ICD-10-CM

## 2016-11-22 DIAGNOSIS — I10 Essential (primary) hypertension: Secondary | ICD-10-CM

## 2016-11-22 NOTE — Progress Notes (Signed)
Subjective:   Mary Pacheco is a 76 y.o. female who presents for Medicare Annual (Subsequent) preventive examination.  Review of Systems:  No ROS.  Medicare Wellness Visit.  Cardiac Risk Factors include: advanced age (>32men, >32 women);hypertension     Objective:     Vitals: BP 138/78   Pulse 77   Temp 97.8 F (36.6 C) (Oral)   Ht 5\' 4"  (1.626 m)   Wt 152 lb 3.2 oz (69 kg)   SpO2 98%   BMI 26.13 kg/m   Body mass index is 26.13 kg/m.   Tobacco History  Smoking Status  . Never Smoker  Smokeless Tobacco  . Never Used     Counseling given: Not Answered   Past Medical History:  Diagnosis Date  . Anxiety 2011   patient with anxiety will continue Alprazolam prn  . Arthritis   . GERD (gastroesophageal reflux disease)    pt. with esophagea reflux. She reports great improvement with Dexilant.  [name] Hearing loss    s/p hearing aids  . 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 Hyperlipidemia    patient with HL on statins. lipids well controlled  . Hypertension    Past Surgical History:  Procedure Laterality Date  . ABDOMINAL HYSTERECTOMY  1993   multiple fibroid cysts  . cataract surgery  02/2011   bilateral  . TONSILLECTOMY     Family History  Problem Relation Age of Onset  . Heart failure Mother   . Heart disease Mother     CABG  . Hypertension Mother   . Hyperlipidemia Mother   . Stroke Mother     mini strokes, multiple  . Heart attack Father   . Heart failure Father   . Hyperlipidemia Father   . Hypertension Father   . Heart attack Sister 69    MI   History  Sexual Activity  . Sexual activity: Yes    Outpatient Encounter Prescriptions as of 11/22/2016  Medication Sig  . acyclovir (ZOVIRAX) 400 MG tablet TAKE 1 TABLET (400 MG TOTAL) BY MOUTH DAILY.  [name] ALPRAZolam (XANAX) 0.25 MG tablet TAKE 1 TABLET BY MOUTH 3 TIMES A DAY AS NEEDED FOR ANXIETY  . aspirin 81 MG tablet Take 81 mg by mouth daily.    [name] atorvastatin (LIPITOR) 20 MG tablet Take 1 tablet (20 mg  total) by mouth daily.  . celecoxib (CELEBREX) 200 MG capsule TAKE 1 CAPSULE (200 MG TOTAL) BY MOUTH 2 (TWO) TIMES DAILY.  . cholecalciferol (VITAMIN D) 1000 UNITS tablet Take 1,000 Units by mouth daily.    . Coenzyme Q10 (EQL COQ10) 300 MG CAPS Take by mouth.  . CRANBERRY PO Take by mouth.  . Flaxseed, Linseed, (FLAX SEED OIL PO) Take 1,400 mg by mouth daily.  . hydrochlorothiazide (MICROZIDE) 12.5 MG capsule Take 1 capsule (12.5 mg total) by mouth daily.  [name] levothyroxine (SYNTHROID, LEVOTHROID) 50 MCG tablet TAKE 1 TABLET DAILY  . Misc Natural Products (TOTAL CARDIO HEALTH FORMULA PO) Take 1 tablet by mouth daily.    . Omega-3 Fatty Acids (FISH OIL TRIPLE STRENGTH) 1400 MG CAPS Take by mouth.  . pantoprazole (PROTONIX) 40 MG tablet TAKE 1 TABLET (40 MG TOTAL) BY MOUTH DAILY.  . Probiotic Product (PROBIOTIC DAILY PO) Take by mouth.  [name] SINGULAIR 10 MG tablet Take 1 tablet (10 mg total) by mouth at bedtime.  . triamcinolone ointment (KENALOG) 0.1 % APPLY THIN FILM TO AFFECTED AREAS BY TOPICAL ROUTE AS DIRECTED FOR 7 DAYS AS NEEDED  No facility-administered encounter medications on file as of 11/22/2016.     Activities of Daily Living In your present state of health, do you have any difficulty performing the following activities: 11/22/2016  Hearing? N  Vision? N  Difficulty concentrating or making decisions? Y  Walking or climbing stairs? Y  Dressing or bathing? N  Doing errands, shopping? N  Preparing Food and eating ? N  Using the Toilet? N  In the past six months, have you accidently leaked urine? N  Do you have problems with loss of bowel control? N  Managing your Medications? N  Managing your Finances? N  Housekeeping or managing your Housekeeping? N  Some recent data might be hidden    Patient Care Team: Burnard Hawthorne, FNP as PCP - General (Family Medicine) Minna Merritts, MD as Consulting Physician (Cardiology) Katha Cabal, MD (Vascular Surgery) Margaretha Sheffield, MD  (Otolaryngology)    Assessment:    This is a routine wellness examination for Mary Pacheco. The goal of the wellness visit is to assist the patient how to close the gaps in care and create a preventative care plan for the patient.   Taking calcium VIT D as appropriate/Osteoporosis reviewed.  Medications reviewed; taking without issues or barriers.  Safety issues reviewed; smoke detectors in the home. No firearms in the home. Wears seatbelts when driving or riding with others. No violence in the home.  No identified risk were noted; The patient was oriented x 3; appropriate in dress and manner and no objective failures at ADL's or IADL's.   BMI; discussed the importance of a healthy diet, water intake and exercise. Educational material provided.  HTN; followed by PCP.  Health maintenance gap; closed.  Patient Concerns: None at this time. Follow up with PCP as needed.  Exercise Activities and Dietary recommendations Current Exercise Habits: Home exercise routine, Type of exercise: calisthenics;walking, Frequency (Times/Week): 1, Intensity: Mild  Goals    . Increase physical activity      Fall Risk Fall Risk  11/22/2016 11/22/2016 05/22/2016 03/09/2015 04/06/2014  Falls in the past year? No No No No No   Depression Screen PHQ 2/9 Scores 11/22/2016 11/22/2016 05/22/2016 03/09/2015  PHQ - 2 Score 0 0 0 0     Cognitive Function MMSE - Mini Mental State Exam 11/22/2016 03/09/2015  Orientation to time 5 5  Orientation to Place 5 5  Registration 3 3  Attention/ Calculation 5 5  Recall 3 3  Language- name 2 objects 2 2  Language- repeat 1 1  Language- follow 3 step command 3 3  Language- read & follow direction 1 1  Write a sentence 1 1  Copy design 1 1  Total score 30 30        Immunization History  Administered Date(s) Administered  . Influenza Split 07/12/2011, 09/15/2012, 08/06/2014  . Influenza, High Dose Seasonal PF 07/18/2016  . Influenza-Unspecified 07/31/2013  .  Pneumococcal Conjugate-13 04/06/2014  . Pneumococcal Polysaccharide-23 03/20/2009  . Tdap 04/06/2014  . Zoster 07/25/2013   Screening Tests Health Maintenance  Topic Date Due  . MAMMOGRAM  05/24/2017  . TETANUS/TDAP  04/06/2024  . INFLUENZA VACCINE  Completed  . DEXA SCAN  Completed  . ZOSTAVAX  Completed      Plan:    End of life planning; Advance aging; Advanced directives discussed. Copy of current HCPOA/Living Will requested.  Medicare Attestation I have personally reviewed: The patient's medical and social history Their use of alcohol, tobacco or illicit drugs Their  current medications and supplements The patient's functional ability including ADLs,fall risks, home safety risks, cognitive, and hearing and visual impairment Diet and physical activities Evidence for depression   The patient's weight, height, BMI, and visual acuity have been recorded in the chart.  I have made referrals and provided education to the patient based on review of the above and I have provided the patient with a written personalized care plan for preventive services.    During the course of the visit the patient was educated and counseled about the following appropriate screening and preventive services:   Vaccines to include Pneumoccal, Influenza, Hepatitis B, Td, Zostavax, HCV  Electrocardiogram  Cardiovascular Disease  Colorectal cancer screening  Bone density screening  Diabetes screening  Glaucoma screening  Mammography/PAP  Nutrition counseling   Patient Instructions (the written plan) was given to the patient.   Varney Biles, LPN  [id number]  Agree with above plan. Mary arnett Np

## 2016-11-22 NOTE — Progress Notes (Signed)
Subjective:    Patient ID: Mary Pacheco, female    DOB: 1941/09/23, 76 y.o.   MRN: [id number]  CC: Mary Pacheco is a 76 y.o. female who presents today for follow up.   HPI: HTN- Averages  132/68, Compliant with medication. Denies exertional chest pain or pressure, numbness or tingling radiating to left arm or jaw, palpitations, dizziness, frequent headaches, changes in vision, or shortness of breath.   Anxiety- has been on it for many years; cannot remember last time she took one. If she takes one, takes one half.   GERD- on protonix, takes it late afternoon. Controlled 'to a point' Has one ounce vodka at night which can upset the reflux. Food triggers as well. No trouble swallowing , blood in stools or change in weight.  Osteopenia- DEXA 2016, no signifcant changes from prior.  On vitamin D            HISTORY:  Past Medical History:  Diagnosis Date  . Anxiety 2011   patient with anxiety will continue Alprazolam prn  . Arthritis   . GERD (gastroesophageal reflux disease)    pt. with esophagea reflux. She reports great improvement with Dexilant.  Mary Pacheco Hearing loss    s/p hearing aids  . 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 Hyperlipidemia    patient with HL on statins. lipids well controlled  . Hypertension    Past Surgical History:  Procedure Laterality Date  . ABDOMINAL HYSTERECTOMY  1993   multiple fibroid cysts  . cataract surgery  02/2011   bilateral  . TONSILLECTOMY     Family History  Problem Relation Age of Onset  . Heart failure Mother   . Heart disease Mother     CABG  . Hypertension Mother   . Hyperlipidemia Mother   . Stroke Mother     mini strokes, multiple  . Heart attack Father   . Heart failure Father   . Hyperlipidemia Father   . Hypertension Father   . Heart attack Sister 27    MI    Allergies: Molds & smuts; Rosuvastatin; and Simvastatin Current Outpatient Prescriptions on File Prior to Visit  Medication Sig Dispense Refill  . acyclovir  (ZOVIRAX) 400 MG tablet TAKE 1 TABLET (400 MG TOTAL) BY MOUTH DAILY. 90 tablet 1  . ALPRAZolam (XANAX) 0.25 MG tablet TAKE 1 TABLET BY MOUTH 3 TIMES A DAY AS NEEDED FOR ANXIETY 90 tablet 0  . aspirin 81 MG tablet Take 81 mg by mouth daily.      Mary Pacheco atorvastatin (LIPITOR) 20 MG tablet Take 1 tablet (20 mg total) by mouth daily. 90 tablet 3  . celecoxib (CELEBREX) 200 MG capsule TAKE 1 CAPSULE (200 MG TOTAL) BY MOUTH 2 (TWO) TIMES DAILY. 60 capsule 2  . cholecalciferol (VITAMIN D) 1000 UNITS tablet Take 1,000 Units by mouth daily.      . Coenzyme Q10 (EQL COQ10) 300 MG CAPS Take by mouth.    . CRANBERRY PO Take by mouth.    . Flaxseed, Linseed, (FLAX SEED OIL PO) Take 1,400 mg by mouth daily.    . hydrochlorothiazide (MICROZIDE) 12.5 MG capsule Take 1 capsule (12.5 mg total) by mouth daily. 90 capsule 2  . levothyroxine (SYNTHROID, LEVOTHROID) 50 MCG tablet TAKE 1 TABLET DAILY 90 tablet 2  . Misc Natural Products (TOTAL CARDIO HEALTH FORMULA PO) Take 1 tablet by mouth daily.      . Omega-3 Fatty Acids (FISH OIL TRIPLE STRENGTH) 1400 MG CAPS Take  by mouth.    . pantoprazole (PROTONIX) 40 MG tablet TAKE 1 TABLET (40 MG TOTAL) BY MOUTH DAILY. 90 tablet 3  . Probiotic Product (PROBIOTIC DAILY PO) Take by mouth.    Mary Pacheco SINGULAIR 10 MG tablet Take 1 tablet (10 mg total) by mouth at bedtime. 90 tablet 3  . triamcinolone ointment (KENALOG) 0.1 % APPLY THIN FILM TO AFFECTED AREAS BY TOPICAL ROUTE AS DIRECTED FOR 7 DAYS AS NEEDED 30 g 1   No current facility-administered medications on file prior to visit.     Social History  Substance Use Topics  . Smoking status: Never Smoker  . Smokeless tobacco: Never Used  . Alcohol use Yes     Comment: socially    Review of Systems  Constitutional: Negative for chills and fever.  Eyes: Negative for visual disturbance.  Respiratory: Negative for cough.   Cardiovascular: Negative for chest pain and palpitations.  Gastrointestinal: Negative for nausea and  vomiting.  Neurological: Negative for headaches.  Psychiatric/Behavioral: The patient is not nervous/anxious.       Objective:    BP 138/78   Pulse 77   Temp 97.8 F (36.6 C) (Oral)   Ht 5\' 4"  (1.626 m)   Wt 152 lb 3.2 oz (69 kg)   SpO2 98%   BMI 26.13 kg/m  BP Readings from Last 3 Encounters:  11/22/16 138/78  10/29/16 (!) 151/70  05/24/16 130/80   Wt Readings from Last 3 Encounters:  11/22/16 152 lb 3.2 oz (69 kg)  10/29/16 151 lb (68.5 kg)  05/24/16 153 lb 4 oz (69.5 kg)    Physical Exam  Constitutional: She appears well-developed and well-nourished.  Eyes: Conjunctivae are normal.  Cardiovascular: Normal rate, regular rhythm, normal heart sounds and normal pulses.   Pulmonary/Chest: Effort normal and breath sounds normal. She has no wheezes. She has no rhonchi. She has no rales.  Neurological: She is alert.  Skin: Skin is warm and dry.  Psychiatric: She has a normal mood and affect. Her speech is normal and behavior is normal. Thought content normal.  Vitals reviewed.      Assessment & Plan:   Problem List Items Addressed This Visit      Cardiovascular and Mediastinum   Hypertension - Primary     controlled. Stable on current regimen. Pending CMP.      Relevant Orders   Comprehensive metabolic panel     Digestive   GERD (gastroesophageal reflux disease)    controlled. Continue current regimen        Endocrine   Hypothyroidism   Relevant Orders   TSH   VITAMIN D 25 Hydroxy (Vit-D Deficiency, Fractures)     Musculoskeletal and Integument   Osteopenia    stable per dexa 2017. Education provided on vitamin D and calcium.        Other   Anxiety    Stable to resolved. Using very sparingly, maybe 2-3 per month. Discussed risks of BZDs and patient agreed with long term plan of decreasing/discontinuing.           I am having Ms. Urbanik maintain her Misc Natural Products (TOTAL CARDIO HEALTH FORMULA PO), aspirin, cholecalciferol, (Flaxseed,  Linseed, (FLAX SEED OIL PO)), FISH OIL TRIPLE STRENGTH, Coenzyme Q10, CRANBERRY PO, triamcinolone ointment, Probiotic Product (PROBIOTIC DAILY PO), celecoxib, SINGULAIR, atorvastatin, ALPRAZolam, levothyroxine, pantoprazole, acyclovir, and hydrochlorothiazide.   No orders of the defined types were placed in this encounter.   Return precautions given.   Risks, benefits, and alternatives of the  medications and treatment plan prescribed today were discussed, and patient expressed understanding.   Education regarding symptom management and diagnosis given to patient on AVS.  Continue to follow with Mable Paris, FNP for routine health maintenance.   [name] and I agreed with plan.   Mable Paris, FNP   Agree with Denisa's plan in above note as well.

## 2016-11-22 NOTE — Assessment & Plan Note (Signed)
Stable to resolved. Using very sparingly, maybe 2-3 per month. Discussed risks of BZDs and patient agreed with long term plan of decreasing/discontinuing.

## 2016-11-22 NOTE — Assessment & Plan Note (Signed)
controlled. Continue current regimen

## 2016-11-22 NOTE — Patient Instructions (Addendum)
My pleasure meeting you   Labs today  Your dexa scan shows osteopenia.  We need to follow this and monitor again in 2 more years to see if improved or worsened.  For post menopausal women, guidelines recommend a diet with 1200 mg of Calcium per day. If you are eating calcium rich foods, you do not need a calcium supplement. The body better absorbs the calcium that you eat over supplementation. If you do supplement, I recommend not supplementing the full 1200 mg/ day as this can lead to increased risk of cardiovascular disease. I recommend Calcium Citrate over the counter, and you may take a total of 600 to 800 mg per day in divided doses with meals for best absorption.   For bone health, you need adequate vitamin D, and I recommend you supplement as it is harder to do so with diet alone. I recommend cholecalciferol 800 units daily.  Also, please ensure you are following a diet high in calcium -- research shows better outcomes with dietary sources including kale, yogurt, broccolii, cheese, okra, almonds- to name a few.     Also remember that exercise is a great medicine for maintain and preserve bone health. Advise moderate exercise for 30 minutes , 3 times per week.

## 2016-11-22 NOTE — Progress Notes (Signed)
Pre visit review using our clinic review tool, if applicable. No additional management support is needed unless otherwise documented below in the visit note. 

## 2016-11-22 NOTE — Assessment & Plan Note (Signed)
stable per dexa 2017. Education provided on vitamin D and calcium.

## 2016-11-22 NOTE — Assessment & Plan Note (Signed)
controlled. Stable on current regimen. Pending CMP.

## 2016-11-23 LAB — COMPREHENSIVE METABOLIC PANEL
ALBUMIN: 4.8 g/dL (ref 3.5–5.2)
ALK PHOS: 75 U/L (ref 39–117)
ALT: 29 U/L (ref 0–35)
AST: 28 U/L (ref 0–37)
BUN: 23 mg/dL (ref 6–23)
CO2: 29 mEq/L (ref 19–32)
Calcium: 10.1 mg/dL (ref 8.4–10.5)
Chloride: 102 mEq/L (ref 96–112)
Creatinine, Ser: 0.86 mg/dL (ref 0.40–1.20)
GFR: 68.13 mL/min (ref >60.00)
Glucose, Bld: 92 mg/dL (ref 70–99)
POTASSIUM: 4.2 meq/L (ref 3.5–5.1)
SODIUM: 139 meq/L (ref 135–145)
TOTAL PROTEIN: 7.4 g/dL (ref 6.0–8.3)
Total Bilirubin: 0.7 mg/dL (ref 0.2–1.2)

## 2016-11-23 LAB — TSH: TSH: 1.67 u[IU]/mL (ref 0.35–4.50)

## 2016-11-23 LAB — VITAMIN D 25 HYDROXY (VIT D DEFICIENCY, FRACTURES): VITD: 32.8 ng/mL (ref 30.00–100.00)

## 2016-12-06 ENCOUNTER — Telehealth: Payer: Self-pay | Admitting: Family

## 2016-12-06 NOTE — Telephone Encounter (Signed)
Left message for patient to check Mychart message margaret or return call back for results.

## 2016-12-06 NOTE — Telephone Encounter (Signed)
Pt called to get lab results from 11/22/2016. Thank you!  Call pt @ [phone].

## 2016-12-07 NOTE — Telephone Encounter (Signed)
Patient was informed of results.  Patient understood and no questions, comments, or concerns at this time.  

## 2016-12-27 ENCOUNTER — Other Ambulatory Visit: Payer: Self-pay | Admitting: Family

## 2016-12-31 ENCOUNTER — Other Ambulatory Visit: Payer: Self-pay

## 2016-12-31 MED ORDER — ACYCLOVIR 400 MG PO TABS: ORAL_TABLET | ORAL | 0 refills | Status: DC

## 2017-01-09 ENCOUNTER — Other Ambulatory Visit: Payer: Self-pay

## 2017-01-09 MED ORDER — SINGULAIR 10 MG PO TABS
10.0000 mg | ORAL_TABLET | Freq: Every day | ORAL | 3 refills | Status: DC
Start: 2017-01-09 — End: 2017-04-05

## 2017-01-25 ENCOUNTER — Other Ambulatory Visit: Payer: Self-pay

## 2017-01-25 MED ORDER — LEVOTHYROXINE SODIUM 50 MCG PO TABS: 50.0000 ug | ORAL_TABLET | Freq: Every day | ORAL | 2 refills | Status: DC

## 2017-01-25 NOTE — Telephone Encounter (Signed)
Medication has been refilled.

## 2017-01-29 ENCOUNTER — Other Ambulatory Visit: Payer: Self-pay

## 2017-01-29 MED ORDER — LEVOTHYROXINE SODIUM 50 MCG PO TABS: 50.0000 ug | ORAL_TABLET | Freq: Every day | ORAL | 2 refills | Status: DC

## 2017-01-29 NOTE — Telephone Encounter (Signed)
Medication has been refilled to correct pharmacy.

## 2017-03-28 ENCOUNTER — Other Ambulatory Visit: Payer: Self-pay | Admitting: Family

## 2017-04-03 ENCOUNTER — Other Ambulatory Visit: Payer: Self-pay

## 2017-04-03 ENCOUNTER — Telehealth: Payer: Self-pay | Admitting: *Deleted

## 2017-04-03 MED ORDER — ACYCLOVIR 400 MG PO TABS: ORAL_TABLET | ORAL | 0 refills | Status: DC

## 2017-04-03 NOTE — Telephone Encounter (Signed)
Medication has been refilled.

## 2017-04-03 NOTE — Telephone Encounter (Signed)
Medication Refill requested for : Acyclovir  Pharmacy: [facility] in [place]  Return Contact : [phone]

## 2017-04-05 ENCOUNTER — Other Ambulatory Visit: Payer: Self-pay

## 2017-04-05 MED ORDER — SINGULAIR 10 MG PO TABS: 10.0000 mg | ORAL_TABLET | Freq: Every day | ORAL | 3 refills | Status: DC

## 2017-04-05 MED ORDER — ATORVASTATIN CALCIUM 20 MG PO TABS: 20.0000 mg | ORAL_TABLET | Freq: Every day | ORAL | 3 refills | Status: DC

## 2017-04-05 NOTE — Telephone Encounter (Signed)
Pharmacy has been changed

## 2017-04-05 NOTE — Telephone Encounter (Signed)
Medication has been refilled per request. 

## 2017-05-16 ENCOUNTER — Other Ambulatory Visit: Payer: Self-pay | Admitting: Family

## 2017-05-22 ENCOUNTER — Telehealth: Payer: Self-pay | Admitting: Radiology

## 2017-05-22 ENCOUNTER — Encounter: Payer: Self-pay | Admitting: Family

## 2017-05-22 ENCOUNTER — Ambulatory Visit (INDEPENDENT_AMBULATORY_CARE_PROVIDER_SITE_OTHER): Payer: Medicare Other | Admitting: Family

## 2017-05-22 VITALS — BP 135/60 | HR 77 | Temp 97.8°F | Ht 64.0 in | Wt 146.0 lb

## 2017-05-22 DIAGNOSIS — I10 Essential (primary) hypertension: Secondary | ICD-10-CM | POA: Diagnosis not present

## 2017-05-22 DIAGNOSIS — K219 Gastro-esophageal reflux disease without esophagitis: Secondary | ICD-10-CM

## 2017-05-22 LAB — TSH: TSH: 1.73 u[IU]/mL (ref 0.35–4.50)

## 2017-05-22 LAB — LIPID PANEL
Cholesterol: 165 mg/dL (ref 0–200)
HDL: 71.7 mg/dL (ref >39.00)
LDL CALC: 80 mg/dL (ref 0–99)
NonHDL: 92.83
TRIGLYCERIDES: 66 mg/dL (ref 0.0–149.0)
Total CHOL/HDL Ratio: 2
VLDL: 13.2 mg/dL (ref 0.0–40.0)

## 2017-05-22 LAB — BASIC METABOLIC PANEL
BUN: 18 mg/dL (ref 6–23)
CALCIUM: 10.2 mg/dL (ref 8.4–10.5)
CO2: 28 meq/L (ref 19–32)
Chloride: 100 mEq/L (ref 96–112)
Creatinine, Ser: 0.9 mg/dL (ref 0.40–1.20)
GFR: 64.56 mL/min (ref >60.00)
GLUCOSE: 115 mg/dL — AB (ref 70–99)
POTASSIUM: 4.4 meq/L (ref 3.5–5.1)
Sodium: 138 mEq/L (ref 135–145)

## 2017-05-22 LAB — HEMOGLOBIN A1C: Hgb A1c MFr Bld: 5.8 % (ref 4.6–6.5)

## 2017-05-22 NOTE — Assessment & Plan Note (Signed)
Symptoms controlled on long-standing PPI. Discussed with patient risks of long-standing GERD and my advise for an EGD. Patient agreed to be seen by GI for possible EGD and further evaluation.

## 2017-05-22 NOTE — Progress Notes (Signed)
Subjective:    Patient ID: Mary Pacheco, female    DOB: 12/06/1940, 76 y.o.   MRN: [id number]  CC: Mary Pacheco is a 76 y.o. female who presents today for follow up.   HPI: HTN- compliant , 130/64.  Denies exertional chest pain or pressure, numbness or tingling radiating to left arm or jaw, palpitations, dizziness, frequent headaches, changes in vision, or shortness of breath.    GERD- controlled with protonix. Several times in the last few months and no symptoms.    Would like shingrex vaccine.  HISTORY:  Past Medical History:  Diagnosis Date  . Anxiety 2011   patient with anxiety will continue Alprazolam prn  . Arthritis   . GERD (gastroesophageal reflux disease)    pt. with esophagea reflux. She reports great improvement with Dexilant.  [name] Hearing loss    s/p hearing aids  . 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 Hyperlipidemia    patient with HL on statins. lipids well controlled  . Hypertension    Past Surgical History:  Procedure Laterality Date  . ABDOMINAL HYSTERECTOMY  1993   multiple fibroid cysts  . cataract surgery  02/2011   bilateral  . TONSILLECTOMY     Family History  Problem Relation Age of Onset  . Heart failure Mother   . Heart disease Mother        CABG  . Hypertension Mother   . Hyperlipidemia Mother   . Stroke Mother        mini strokes, multiple  . Heart attack Father   . Heart failure Father   . Hyperlipidemia Father   . Hypertension Father   . Heart attack Sister 19       MI    Allergies: Molds & smuts; Rosuvastatin; and Simvastatin Current Outpatient Prescriptions on File Prior to Visit  Medication Sig Dispense Refill  . acyclovir (ZOVIRAX) 400 MG tablet TAKE 1 TABLET (400 MG TOTAL) BY MOUTH DAILY. 90 tablet 0  . ALPRAZolam (XANAX) 0.25 MG tablet TAKE 1 TABLET BY MOUTH 3 TIMES A DAY AS NEEDED FOR ANXIETY 90 tablet 0  . aspirin 81 MG tablet Take 81 mg by mouth daily.      [name] atorvastatin (LIPITOR) 20 MG tablet Take 1 tablet (20 mg total)  by mouth daily. 90 tablet 3  . celecoxib (CELEBREX) 200 MG capsule TAKE 1 CAPSULE (200 MG TOTAL) BY MOUTH 2 (TWO) TIMES DAILY. 60 capsule 2  . cholecalciferol (VITAMIN D) 1000 UNITS tablet Take 1,000 Units by mouth daily.      . Coenzyme Q10 (EQL COQ10) 300 MG CAPS Take by mouth.    . CRANBERRY PO Take by mouth.    . Flaxseed, Linseed, (FLAX SEED OIL PO) Take 1,400 mg by mouth daily.    . hydrochlorothiazide (MICROZIDE) 12.5 MG capsule TAKE 1 CAPSULE DAILY (MUST ESTABLISH WITH NEW PRIMARY CARE PHYSICIAN) 90 capsule 2  . levothyroxine (SYNTHROID, LEVOTHROID) 50 MCG tablet Take 1 tablet (50 mcg total) by mouth daily. 90 tablet 2  . Misc Natural Products (TOTAL CARDIO HEALTH FORMULA PO) Take 1 tablet by mouth daily.      . Omega-3 Fatty Acids (FISH OIL TRIPLE STRENGTH) 1400 MG CAPS Take by mouth.    . pantoprazole (PROTONIX) 40 MG tablet TAKE 1 TABLET (40 MG TOTAL) BY MOUTH DAILY. 90 tablet 3  . Probiotic Product (PROBIOTIC DAILY PO) Take by mouth.    [name] SINGULAIR 10 MG tablet Take 1 tablet (10 mg total) by  mouth at bedtime. 90 tablet 3  . triamcinolone ointment (KENALOG) 0.1 % APPLY THIN FILM TO AFFECTED AREAS BY TOPICAL ROUTE AS DIRECTED FOR 7 DAYS AS NEEDED 30 g 1   No current facility-administered medications on file prior to visit.     Social History  Substance Use Topics  . Smoking status: Never Smoker  . Smokeless tobacco: Never Used  . Alcohol use Yes     Comment: socially    Review of Systems  Constitutional: Negative for chills and fever.  Eyes: Negative for visual disturbance.  Respiratory: Negative for cough.   Cardiovascular: Negative for chest pain and palpitations.  Gastrointestinal: Negative for nausea and vomiting.  Neurological: Negative for headaches.      Objective:    BP 135/60   Pulse 77   Temp 97.8 F (36.6 C) (Oral)   Ht 5\' 4"  (1.626 m)   Wt 146 lb (66.2 kg)   SpO2 97%   BMI 25.06 kg/m  BP Readings from Last 3 Encounters:  05/22/17 135/60  11/22/16  138/78  10/29/16 (!) 151/70   Wt Readings from Last 3 Encounters:  05/22/17 146 lb (66.2 kg)  11/22/16 152 lb 3.2 oz (69 kg)  10/29/16 151 lb (68.5 kg)    Physical Exam  Constitutional: She appears well-developed and well-nourished.  Eyes: Conjunctivae are normal.  Cardiovascular: Normal rate, regular rhythm, normal heart sounds and normal pulses.   Pulmonary/Chest: Effort normal and breath sounds normal. She has no wheezes. She has no rhonchi. She has no rales.  Neurological: She is alert.  Skin: Skin is warm and dry.  Psychiatric: She has a normal mood and affect. Her speech is normal and behavior is normal. Thought content normal.  Vitals reviewed.      Assessment & Plan:   Problem List Items Addressed This Visit      Cardiovascular and Mediastinum   Hypertension - Primary    Controlled. Continue current regimen. Pending BMP      Relevant Orders   Basic metabolic panel   TSH     Digestive   GERD (gastroesophageal reflux disease)    Symptoms controlled on long-standing PPI. Discussed with patient risks of long-standing GERD and my advise for an EGD. Patient agreed to be seen by GI for possible EGD and further evaluation.      Relevant Orders   Ambulatory referral to Gastroenterology       I am having Ms. Demaria maintain her Misc Natural Products (TOTAL CARDIO HEALTH FORMULA PO), aspirin, cholecalciferol, (Flaxseed, Linseed, (FLAX SEED OIL PO)), FISH OIL TRIPLE STRENGTH, Coenzyme Q10, CRANBERRY PO, triamcinolone ointment, Probiotic Product (PROBIOTIC DAILY PO), celecoxib, ALPRAZolam, pantoprazole, levothyroxine, acyclovir, atorvastatin, SINGULAIR, and hydrochlorothiazide.   No orders of the defined types were placed in this encounter.   Return precautions given.   Risks, benefits, and alternatives of the medications and treatment plan prescribed today were discussed, and patient expressed understanding.   Education regarding symptom management and diagnosis  given to patient on AVS.  Continue to follow with Burnard Hawthorne, FNP for routine health maintenance.   [name] and I agreed with plan.   Mable Paris, FNP

## 2017-05-22 NOTE — Progress Notes (Signed)
Pre visit review using our clinic review tool, if applicable. No additional management support is needed unless otherwise documented below in the visit note. 

## 2017-05-22 NOTE — Progress Notes (Deleted)
Cardiology Office Note  Date:  05/22/2017   ID:  Mary Pacheco, DOB January 14, 1941, MRN [id number]  PCP:  Burnard Hawthorne, FNP   No chief complaint on file.   HPI:  76 year old female with history of hypertension, hyperlipidemia, bilateral carotid arterial disease, worse on the right than the left, estimated at 60-69% on the right, 50% on the left, history of anxiety presenting for routine followup of her PAD  In follow-up today, we discussed her previous CT coronary calcium score CT coronary score of 5 Lipids total chol 163, ldl 70 Tried lipitor 40 had side effects , pain in her hands Carotids followed by vein and vascular By her report, there has been no significant progression  She was previously taking Celebrex for arthritis pain Long discussion concerning her cholesterol. Was previously taking Crestor 5 mg daily but changed for insurance reasons. She was changed from simvastatin 20 mg daily (had myalgias on higher dose). Most recent cholesterol on simvastatin was in the 180 range.   Overall she feels well with no complaints  EKG on today's visit shows normal sinus rhythm with rate 65 bpm, no significant ST or T-wave changes  She denies any smoking history, no diabetes. She tries to watch what she eats  Other past medical history She does report having history of GI ulcer/bleeding in 2003 which she attributes to NSAIDs which she was taking for arthritis. She recently switched from Nexium to Protonix. She does have some discomfort in her chest in the morning when she wakes up which she attributes to GERD.  PMH:   has a past medical history of Anxiety (2011); Arthritis; GERD (gastroesophageal reflux disease); Hearing loss; Herpes (02/20/2010); Hyperlipidemia; and Hypertension.  PSH:    Past Surgical History:  Procedure Laterality Date  . ABDOMINAL HYSTERECTOMY  1993   multiple fibroid cysts  . cataract surgery  02/2011   bilateral  . TONSILLECTOMY      Current Outpatient  Prescriptions  Medication Sig Dispense Refill  . acyclovir (ZOVIRAX) 400 MG tablet TAKE 1 TABLET (400 MG TOTAL) BY MOUTH DAILY. 90 tablet 0  . ALPRAZolam (XANAX) 0.25 MG tablet TAKE 1 TABLET BY MOUTH 3 TIMES A DAY AS NEEDED FOR ANXIETY 90 tablet 0  . aspirin 81 MG tablet Take 81 mg by mouth daily.      [name] atorvastatin (LIPITOR) 20 MG tablet Take 1 tablet (20 mg total) by mouth daily. 90 tablet 3  . celecoxib (CELEBREX) 200 MG capsule TAKE 1 CAPSULE (200 MG TOTAL) BY MOUTH 2 (TWO) TIMES DAILY. 60 capsule 2  . cholecalciferol (VITAMIN D) 1000 UNITS tablet Take 1,000 Units by mouth daily.      . Coenzyme Q10 (EQL COQ10) 300 MG CAPS Take by mouth.    . CRANBERRY PO Take by mouth.    . Flaxseed, Linseed, (FLAX SEED OIL PO) Take 1,400 mg by mouth daily.    . hydrochlorothiazide (MICROZIDE) 12.5 MG capsule TAKE 1 CAPSULE DAILY (MUST ESTABLISH WITH NEW PRIMARY CARE PHYSICIAN) 90 capsule 2  . levothyroxine (SYNTHROID, LEVOTHROID) 50 MCG tablet Take 1 tablet (50 mcg total) by mouth daily. 90 tablet 2  . Misc Natural Products (TOTAL CARDIO HEALTH FORMULA PO) Take 1 tablet by mouth daily.      . Omega-3 Fatty Acids (FISH OIL TRIPLE STRENGTH) 1400 MG CAPS Take by mouth.    . pantoprazole (PROTONIX) 40 MG tablet TAKE 1 TABLET (40 MG TOTAL) BY MOUTH DAILY. 90 tablet 3  . Probiotic Product (PROBIOTIC DAILY  PO) Take by mouth.    [name] SINGULAIR 10 MG tablet Take 1 tablet (10 mg total) by mouth at bedtime. 90 tablet 3  . triamcinolone ointment (KENALOG) 0.1 % APPLY THIN FILM TO AFFECTED AREAS BY TOPICAL ROUTE AS DIRECTED FOR 7 DAYS AS NEEDED 30 g 1   No current facility-administered medications for this visit.      Allergies:   Molds & smuts; Rosuvastatin; and Simvastatin   Social History:  The patient  reports that she has never smoked. She has never used smokeless tobacco. She reports that she drinks alcohol. She reports that she does not use drugs.   Family History:   family history includes Heart attack in  her father; Heart attack (age of onset: 50) in her sister; Heart disease in her mother; Heart failure in her father and mother; Hyperlipidemia in her father and mother; Hypertension in her father and mother; Stroke in her mother.    Review of Systems: Review of Systems  Constitutional: Negative.   Respiratory: Negative.   Cardiovascular: Negative.   Gastrointestinal: Negative.   Musculoskeletal: Negative.   Neurological: Negative.   Psychiatric/Behavioral: Negative.   All other systems reviewed and are negative.    PHYSICAL EXAM: VS:  There were no vitals taken for this visit. , BMI There is no height or weight on file to calculate BMI. GEN: Well nourished, well developed, in no acute distress  HEENT: normal  Neck: no JVD, carotid bruits, or masses Cardiac: RRR; no murmurs, rubs, or gallops,no edema  Respiratory:  clear to auscultation bilaterally, normal work of breathing GI: soft, nontender, nondistended, + BS MS: no deformity or atrophy  Skin: warm and dry, no rash Neuro:  Strength and sensation are intact Psych: euthymic mood, full affect    Recent Labs: 11/22/2016: ALT 29 05/22/2017: BUN 18; Creatinine, Ser 0.90; Potassium 4.4; Sodium 138; TSH 1.73    Lipid Panel Lab Results  Component Value Date   CHOL 165 05/22/2017   HDL 71.70 05/22/2017   LDLCALC 80 05/22/2017   TRIG 66.0 05/22/2017      Wt Readings from Last 3 Encounters:  05/22/17 146 lb (66.2 kg)  11/22/16 152 lb 3.2 oz (69 kg)  10/29/16 151 lb (68.5 kg)       ASSESSMENT AND PLAN:  Essential hypertension - Plan: EKG 12-Lead Blood pressure is well controlled on today's visit. No changes made to the medications.  Hyperlipidemia Long discussion concerning her cholesterol management Options include continuing Lipitor 20 mg daily Currently cholesterol above goal and given significant carotid disease, we have recommended a more aggressive regiment. Other options include increasing Lipitor up to 40 mg  daily but this causes hand pain. Could change to Crestor 20 mg daily now this is generic. Could add zetia 10 mg daily and stay Lipitor 20 For now she would like to take Lipitor 30 mg daily and recheck her cholesterol in several months time on routine follow-up with primary care  Carotid stenosis, bilateral 60% carotid disease on previous ultrasound Stressed importance of aggressive cholesterol management  Overweight (BMI 25.0-29.9) Diet and exercise program discussed with her   Total encounter time more than 25 minutes  Greater than 50% was spent in counseling and coordination of care with the patient   Disposition:   F/U  6 months   No orders of the defined types were placed in this encounter.    Signed, Esmond Plants, M.D., Ph.D. 05/22/2017  [place], [place]

## 2017-05-22 NOTE — Assessment & Plan Note (Signed)
Controlled. Continue current regimen. Pending BMP

## 2017-05-22 NOTE — Telephone Encounter (Signed)
While drawing pt, pt stated she needed to have her cholesterol checked as well. Told pt I would ask you if it was ok to add. Correct tube was drawn if you would like to add. Thank you.

## 2017-05-22 NOTE — Addendum Note (Signed)
Addended by: Burnard Hawthorne on: 05/22/2017 11:31 AM   Modules accepted: Orders

## 2017-05-22 NOTE — Patient Instructions (Addendum)
Shingrex vaccine series at local  Pharmacy  GI referral for EGD  Pleasure seeing you

## 2017-05-23 ENCOUNTER — Ambulatory Visit: Payer: Medicare Other | Admitting: Cardiovascular Disease

## 2017-05-27 ENCOUNTER — Telehealth: Payer: Self-pay | Admitting: Family

## 2017-05-27 MED ORDER — PANTOPRAZOLE SODIUM 40 MG PO TBEC: DELAYED_RELEASE_TABLET | ORAL | 3 refills | Status: DC

## 2017-05-27 NOTE — Telephone Encounter (Signed)
Pt sent Rx to [facility] for pantoprazole (PROTONIX) 40 MG tablet and the pharmacy is waiting to here from the doctor. Please advise?  Call pt @ [phone]. Thank you!

## 2017-05-27 NOTE — Telephone Encounter (Signed)
Medication has been refilled.

## 2017-06-06 ENCOUNTER — Telehealth: Payer: Self-pay | Admitting: Family

## 2017-06-06 DIAGNOSIS — Z1239 Encounter for other screening for malignant neoplasm of breast: Secondary | ICD-10-CM

## 2017-06-06 NOTE — Telephone Encounter (Signed)
Pt lvm for mammogram. Please enter order.

## 2017-06-06 NOTE — Telephone Encounter (Signed)
Done. Please let pt know

## 2017-06-07 NOTE — Telephone Encounter (Signed)
Ok. Referral was faxed. °

## 2017-06-27 ENCOUNTER — Other Ambulatory Visit: Payer: Self-pay

## 2017-06-27 ENCOUNTER — Ambulatory Visit (INDEPENDENT_AMBULATORY_CARE_PROVIDER_SITE_OTHER): Payer: Medicare Other | Admitting: Gastroenterology

## 2017-06-27 ENCOUNTER — Ambulatory Visit: Payer: Medicare Other | Admitting: Gastroenterology

## 2017-06-27 ENCOUNTER — Encounter: Payer: Self-pay | Admitting: Gastroenterology

## 2017-06-27 VITALS — BP 141/63 | HR 74 | Temp 98.3°F | Ht 64.0 in | Wt 146.4 lb

## 2017-06-27 DIAGNOSIS — R12 Heartburn: Secondary | ICD-10-CM | POA: Diagnosis not present

## 2017-06-27 NOTE — Patient Instructions (Addendum)
1. Stop protonix 2. Start rantidine or zantac 150mg  1-2 times a day or pepcid 20mg  1-2 times daily 3. If heart burn returns while on ranitidine in 2 weeks, we will schedule EGD   Please call our office to speak with my nurse Driscilla Grammes at [phone] during business hours from 8am to 4pm if you have any questions/concerns. During after hours, you will be redirected to on call GI physician. For any emergency please call 911 or go the nearest emergency room.    Cephas Darby, MD [address]  [place]  [place], [place] [address]  Main: [phone]  Fax: [phone]

## 2017-06-27 NOTE — Progress Notes (Signed)
Cephas Darby, MD [address]  [place]  [place], [place] [address]  Main: [phone]  Fax: [phone]    Gastroenterology Consultation  Referring Provider:     Burnard Hawthorne, FNP Primary Care Physician:  Burnard Hawthorne, FNP Primary Gastroenterologist:  Dr. Cephas Darby Reason for Consultation:     GERD       HPI:   Mary Pacheco is a 76 y.o. y/o female referred by Dr. Burnard Hawthorne, FNP  for consultation & management of reflux. Burning in throat, on and off, most of the days in a week, Denies nocturnal symptoms Took protonix when she had gastric ulcer in 2004, then switched to nexium. Developed heart burn off PPI, restarted pantoprazole and her heart burn is under control. Certain foods, ETOH triggers heart burn. Denies nocturnal heart burn on PPI. Denies dysphagia, hematemesis, regurgitation, n/v/f/c. Does not smoke, NSIADs occasional. ETOH 1/day Has regular BMs, denies abd pain Taking probiotic daily  Bleeding gastric ulcer in 2004 seen on EGD, cauterized. Follow up EGD confirmed healing. GI Procedures: Colonoscopy 05/18/2014 A. RECTUM, ENDOSCOPIC BIOPSY:   COLONIC MUCOSA WITH REACTIVE/REPARATIVE CHANGES.   NO EVIDENCE OF ADENOMATOUS MUCOSA.  Past Medical History:  Diagnosis Date  . Anxiety 2011   patient with anxiety will continue Alprazolam prn  . Arthritis   . GERD (gastroesophageal reflux disease)    pt. with esophagea reflux. She reports great improvement with Dexilant.  [name] Hearing loss    s/p hearing aids  . 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 Hyperlipidemia    patient with HL on statins. lipids well controlled  . Hypertension     Past Surgical History:  Procedure Laterality Date  . ABDOMINAL HYSTERECTOMY  1993   multiple fibroid cysts  . cataract surgery  02/2011   bilateral  . TONSILLECTOMY      Prior to Admission medications   Medication Sig Start Date End Date Taking? Authorizing Provider  acyclovir (ZOVIRAX) 400 MG  tablet TAKE 1 TABLET (400 MG TOTAL) BY MOUTH DAILY. 04/03/17   Burnard Hawthorne, FNP  ALPRAZolam Duanne Moron) 0.25 MG tablet TAKE 1 TABLET BY MOUTH 3 TIMES A DAY AS NEEDED FOR ANXIETY 03/16/16   Jackolyn Confer, MD  aspirin 81 MG tablet Take 81 mg by mouth daily.      [provider]  atorvastatin (LIPITOR) 20 MG tablet Take 1 tablet (20 mg total) by mouth daily. 04/05/17   Burnard Hawthorne, FNP  celecoxib (CELEBREX) 200 MG capsule TAKE 1 CAPSULE (200 MG TOTAL) BY MOUTH 2 (TWO) TIMES DAILY. 11/08/15   Jackolyn Confer, MD  cholecalciferol (VITAMIN D) 1000 UNITS tablet Take 1,000 Units by mouth daily.      [provider]  Coenzyme Q10 (EQL COQ10) 300 MG CAPS Take by mouth.    [provider]  CRANBERRY PO Take by mouth.    [provider]  Doxepin HCl 5 % CREA APPLY TO ARMS TWICE A DAY AS NEEDED FOR ITCHING 06/05/17   [provider]  Flaxseed, Linseed, (FLAX SEED OIL PO) Take 1,400 mg by mouth daily.    [provider]  hydrochlorothiazide (MICROZIDE) 12.5 MG capsule TAKE 1 CAPSULE DAILY (MUST ESTABLISH WITH NEW PRIMARY CARE PHYSICIAN) 05/17/17   Burnard Hawthorne, FNP  levothyroxine (SYNTHROID, LEVOTHROID) 50 MCG tablet Take 1 tablet (50 mcg total) by mouth daily. 01/29/17   Burnard Hawthorne, FNP  Misc Natural Products (TOTAL CARDIO HEALTH FORMULA PO) Take  1 tablet by mouth daily.      [provider]  Omega-3 Fatty Acids (FISH OIL TRIPLE STRENGTH) 1400 MG CAPS Take by mouth.    [provider]  pantoprazole (PROTONIX) 40 MG tablet TAKE 1 TABLET (40 MG TOTAL) BY MOUTH DAILY. 05/27/17   Burnard Hawthorne, FNP  Probiotic Product (PROBIOTIC DAILY PO) Take by mouth.    [provider]  SINGULAIR 10 MG tablet Take 1 tablet (10 mg total) by mouth at bedtime. 04/05/17   Burnard Hawthorne, FNP  triamcinolone (KENALOG) 0.025 % cream APPLY TO LEGS TWICE A DAY AS NEEDED FOR ITCHING 06/04/17   [provider]    triamcinolone ointment (KENALOG) 0.1 % APPLY THIN FILM TO AFFECTED AREAS BY TOPICAL ROUTE AS DIRECTED FOR 7 DAYS AS NEEDED 10/06/14   Jackolyn Confer, MD    Family History  Problem Relation Age of Onset  . Heart failure Mother   . Heart disease Mother        CABG  . Hypertension Mother   . Hyperlipidemia Mother   . Stroke Mother        mini strokes, multiple  . Heart attack Father   . Heart failure Father   . Hyperlipidemia Father   . Hypertension Father   . Heart attack Sister 87       MI     Social History  Substance Use Topics  . Smoking status: Never Smoker  . Smokeless tobacco: Never Used  . Alcohol use Yes     Comment: socially    Allergies as of 06/27/2017 - Review Complete 05/22/2017  Allergen Reaction Noted  . Molds & smuts  07/12/2011  . Rosuvastatin Hives 02/21/2016  . Simvastatin  10/06/2014    Review of Systems:    All systems reviewed and negative except where noted in HPI.   Physical Exam:  There were no vitals taken for this visit. No LMP recorded. Patient has had a hysterectomy.  General:   Alert,  Well-developed, well-nourished, pleasant and cooperative in NAD Head:  Normocephalic and atraumatic. Eyes:  Sclera clear, no icterus.   Conjunctiva pink. Ears:  Normal auditory acuity. Nose:  No deformity, discharge, or lesions. Mouth:  No deformity or lesions,oropharynx pink & moist. Neck:  Supple; no masses or thyromegaly. Lungs:  Respirations even and unlabored.  Clear throughout to auscultation.   No wheezes, crackles, or rhonchi. No acute distress. Heart:  Regular rate and rhythm; no murmurs, clicks, rubs, or gallops. Abdomen:  Normal bowel sounds.  No bruits.  Soft, non-tender and non-distended without masses, hepatosplenomegaly or hernias noted.  No guarding or rebound tenderness.   Rectal: Nor performed Msk:  Symmetrical without gross deformities. Good, equal movement & strength bilaterally. Pulses:  Normal pulses noted. Extremities:  No  clubbing or edema.  No cyanosis. Neurologic:  Alert and oriented x3;  grossly normal neurologically. Skin:  Intact without significant lesions or rashes. No jaundice. Psych:  Alert and cooperative. Normal mood and affect.  Imaging Studies:   Assessment and Plan:   Mary Pacheco is a 76 y.o. y/o female with History of peptic ulcer disease in 2004, confirmed healing, unknown H. pylori status, Chronic GERD, well controlled on Protonix 40 mg daily. Patient does not demonstrate any alarm signs/symptoms to recommend EGD. Therefore, I suggested her to  1. Stop protonix 2. Start rantidine or zantac 150mg  1-2 times a day or pepcid 20mg  1-2 times daily 3. If heart burn returns while on ranitidine in  2 weeks, we will schedule EGD  Follow up in 12months   Cephas Darby, MD

## 2017-06-28 ENCOUNTER — Other Ambulatory Visit: Payer: Self-pay | Admitting: Family

## 2017-06-30 NOTE — Progress Notes (Signed)
Cardiology Office Note  Date:  07/01/2017   ID:  Mary Pacheco, DOB Oct 02, 1941, MRN [id number]  PCP:  Burnard Hawthorne, FNP   Chief Complaint  Patient presents with  . other    12 month follow up. Meds reviewed by the pt. verbally. "doing well."     HPI:  76 year old female with history of  hypertension,  hyperlipidemia,  bilateral carotid arterial disease,  estimated at 50-60% on the right, <39% on the left, in 10/2016 anxiety  presenting for routine followup of her PAD/carotid disease, hyperlipidemia  GERD in the AM,  Better now with PPI Tolerating low-dose Lipitor 20 mg daily  denies anychest pain concerning for angina  previousCT coronary calcium score of 5 Lipids total chol 165, ldl 80 Tried lipitor 40 had side effects , pain in her hands  She denies any smoking history, no diabetes. She tries to watch what she eats  Carotids followed by vein and vascular Remains at 50-60% on the right  previously taking Crestor 5 mg daily but changed for insurance reasons.  She was changed from simvastatin 20 mg daily (had myalgias on higher dose).  Lab work reviewed with her in detail Total cholesterol 165, LDL 80 Previous cholesterols in the 180 range hemoglobin A1c 5.8  EKG on today's visit shows normal sinus rhythm with rate 72 bpm, no significant ST or T-wave changes, slightly more pronounced T-wave abnormality in V4 through V6  Other past medical history She does report having history of GI ulcer/bleeding in 2003 which she attributes to NSAIDs which she was taking for arthritis. She recently switched from Nexium to Protonix. She does have some discomfort in her chest in the morning when she wakes up which she attributes to GERD.  PMH:   has a past medical history of Anxiety (2011); Arthritis; GERD (gastroesophageal reflux disease); Hearing loss; Herpes (02/20/2010); Hyperlipidemia; and Hypertension.  PSH:    Past Surgical History:  Procedure Laterality Date  .  ABDOMINAL HYSTERECTOMY  1993   multiple fibroid cysts  . cataract surgery  02/2011   bilateral  . TONSILLECTOMY      Current Outpatient Prescriptions  Medication Sig Dispense Refill  . acyclovir (ZOVIRAX) 400 MG tablet TAKE 1 TABLET BY MOUTH EVERY DAY 90 tablet 0  . ALPRAZolam (XANAX) 0.25 MG tablet TAKE 1 TABLET BY MOUTH 3 TIMES A DAY AS NEEDED FOR ANXIETY 90 tablet 0  . aspirin 81 MG tablet Take 81 mg by mouth daily.      [name] atorvastatin (LIPITOR) 20 MG tablet Take 1 tablet (20 mg total) by mouth daily. 90 tablet 3  . celecoxib (CELEBREX) 200 MG capsule TAKE 1 CAPSULE (200 MG TOTAL) BY MOUTH 2 (TWO) TIMES DAILY. 60 capsule 2  . cholecalciferol (VITAMIN D) 1000 UNITS tablet Take 1,000 Units by mouth daily.      . Coenzyme Q10 (EQL COQ10) 300 MG CAPS Take by mouth.    . CRANBERRY PO Take by mouth.    . Doxepin HCl 5 % CREA APPLY TO ARMS TWICE A DAY AS NEEDED FOR ITCHING  1  . Flaxseed, Linseed, (FLAX SEED OIL PO) Take 1,400 mg by mouth daily.    . hydrochlorothiazide (MICROZIDE) 12.5 MG capsule TAKE 1 CAPSULE DAILY (MUST ESTABLISH WITH NEW PRIMARY CARE PHYSICIAN) 90 capsule 2  . levothyroxine (SYNTHROID, LEVOTHROID) 50 MCG tablet Take 1 tablet (50 mcg total) by mouth daily. 90 tablet 2  . Misc Natural Products (TOTAL CARDIO HEALTH FORMULA PO) Take 1 tablet by  mouth daily.      . Omega-3 Fatty Acids (FISH OIL TRIPLE STRENGTH) 1400 MG CAPS Take by mouth.    . pantoprazole (PROTONIX) 40 MG tablet TAKE 1 TABLET (40 MG TOTAL) BY MOUTH DAILY. 90 tablet 3  . Probiotic Product (PROBIOTIC DAILY PO) Take by mouth.    [name] SINGULAIR 10 MG tablet Take 1 tablet (10 mg total) by mouth at bedtime. 90 tablet 3  . triamcinolone (KENALOG) 0.025 % cream APPLY TO LEGS TWICE A DAY AS NEEDED FOR ITCHING  1  . triamcinolone ointment (KENALOG) 0.1 % APPLY THIN FILM TO AFFECTED AREAS BY TOPICAL ROUTE AS DIRECTED FOR 7 DAYS AS NEEDED 30 g 1  . ezetimibe (ZETIA) 10 MG tablet Take 1 tablet (10 mg total) by mouth daily.  90 tablet 4   No current facility-administered medications for this visit.      Allergies:   Molds & smuts; Rosuvastatin; and Simvastatin   Social History:  The patient  reports that she has never smoked. She has never used smokeless tobacco. She reports that she drinks alcohol. She reports that she does not use drugs.   Family History:   family history includes Heart attack in her father; Heart attack (age of onset: 70) in her sister; Heart disease in her mother; Heart failure in her father and mother; Hyperlipidemia in her father and mother; Hypertension in her father and mother; Stroke in her mother.    Review of Systems: Review of Systems  Constitutional: Negative.   Respiratory: Negative.   Cardiovascular: Negative.   Gastrointestinal: Negative.   Musculoskeletal: Negative.   Neurological: Negative.   Psychiatric/Behavioral: Negative.   All other systems reviewed and are negative.    PHYSICAL EXAM: VS:  BP 130/70 (BP Location: Left Arm, Patient Position: Sitting, Cuff Size: Normal)   Pulse 72   Ht 5\' 4"  (1.626 m)   Wt 145 lb 8 oz (66 kg)   BMI 24.98 kg/m  , BMI Body mass index is 24.98 kg/m.  GEN: Well nourished, well developed, in no acute distress  HEENT: normal  Neck: no JVD, carotid bruits, or masses Cardiac: RRR; no murmurs, rubs, or gallops,no edema  Respiratory:  clear to auscultation bilaterally, normal work of breathing GI: soft, nontender, nondistended, + BS MS: no deformity or atrophy  Skin: warm and dry, no rash Neuro:  Strength and sensation are intact Psych: euthymic mood, full affect    Recent Labs: 11/22/2016: ALT 29 05/22/2017: BUN 18; Creatinine, Ser 0.90; Potassium 4.4; Sodium 138; TSH 1.73    Lipid Panel Lab Results  Component Value Date   CHOL 165 05/22/2017   HDL 71.70 05/22/2017   LDLCALC 80 05/22/2017   TRIG 66.0 05/22/2017      Wt Readings from Last 3 Encounters:  07/01/17 145 lb 8 oz (66 kg)  06/27/17 146 lb 6.4 oz (66.4 kg)   05/22/17 146 lb (66.2 kg)       ASSESSMENT AND PLAN:  Essential hypertension - Plan: EKG 12-Lead Blood pressure is well controlled on today's visit. No changes made to the medications.  Hyperlipidemia Long discussion concerning her cholesterol management Recommended she continue Lipitor 20 mg daily add zetia 10 mg daily  Options discussed to obtain this through [email or web address]  Carotid stenosis, bilateral 60% carotid disease on previous ultrasound Stressed importance of aggressive cholesterol management  Overweight (BMI 25.0-29.9) Diet and exercise program discussed with her Reports she is slowly losing weight   Total encounter time more than 25 minutes  Greater than 50% was spent in counseling and coordination of care with the patient   Disposition:   F/U  12 months   Orders Placed This Encounter  Procedures  . EKG 12-Lead     Signed, Esmond Plants, M.D., Ph.D. 07/01/2017  [place], [place]

## 2017-07-01 ENCOUNTER — Encounter: Payer: Self-pay | Admitting: Cardiovascular Disease

## 2017-07-01 ENCOUNTER — Ambulatory Visit (INDEPENDENT_AMBULATORY_CARE_PROVIDER_SITE_OTHER): Payer: Medicare Other | Admitting: Cardiovascular Disease

## 2017-07-01 VITALS — BP 130/70 | HR 72 | Ht 64.0 in | Wt 145.5 lb

## 2017-07-01 DIAGNOSIS — I6523 Occlusion and stenosis of bilateral carotid arteries: Secondary | ICD-10-CM

## 2017-07-01 DIAGNOSIS — I10 Essential (primary) hypertension: Secondary | ICD-10-CM | POA: Diagnosis not present

## 2017-07-01 DIAGNOSIS — E782 Mixed hyperlipidemia: Secondary | ICD-10-CM | POA: Diagnosis not present

## 2017-07-01 MED ORDER — EZETIMIBE 10 MG PO TABS: 10.0000 mg | ORAL_TABLET | Freq: Every day | ORAL | 4 refills | Status: DC

## 2017-07-01 NOTE — Patient Instructions (Signed)
Medication Instructions:   Please start zetia once a day for cholestreol  Labwork:  No new labs needed  Testing/Procedures:  No further testing at this time   Follow-Up: It was a pleasure seeing you in the office today. Please call us if you have new issues that need to be addressed before your next appt.  [phone]  Your physician wants you to follow-up in: 12 months.  You will receive a reminder letter in the mail two months in advance. If you don't receive a letter, please call our office to schedule the follow-up appointment.  If you need a refill on your cardiac medications before your next appointment, please call your pharmacy.

## 2017-08-05 ENCOUNTER — Telehealth: Payer: Self-pay | Admitting: Family

## 2017-08-05 NOTE — Telephone Encounter (Signed)
close

## 2017-08-18 IMAGING — CT CT HEART SCORING
2 series · 16 of 20 positions shown, 18 images · non-contrast
Comparison: No priors.

CLINICAL DATA: Risk stratification

EXAM:
Coronary Calcium Score
TECHNIQUE: The patient was scanned on a Siemens Sensation 16 slice scanner.
Axial non-contrast 3mm slices were carried out through the heart.
The data set was analyzed on a dedicated work station and scored
using the Agatson method.

[Series 2: casc 3.0 i36f 2 bestdiast 70 % · axial · 0.32mm/px · z∈[+1123,+1207]mm · 8 of 37 slices shown, 10 images]
[im 5/37  vessel]
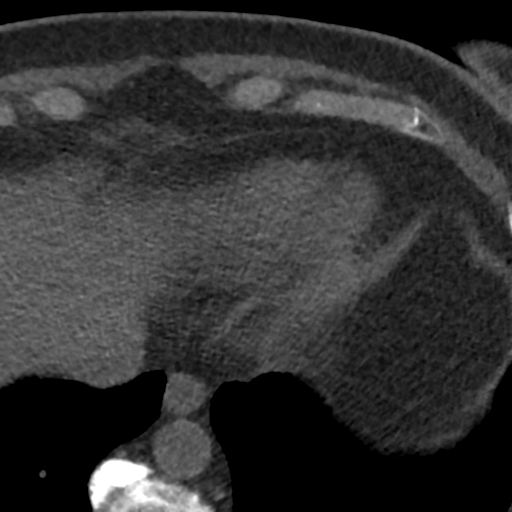
[im 5/37  lung]
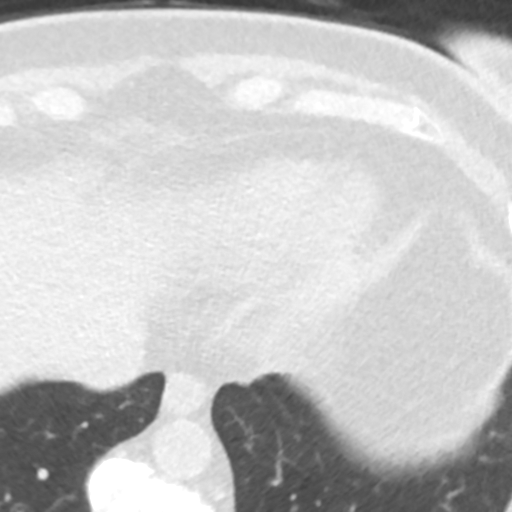
[im 9/37  vessel]
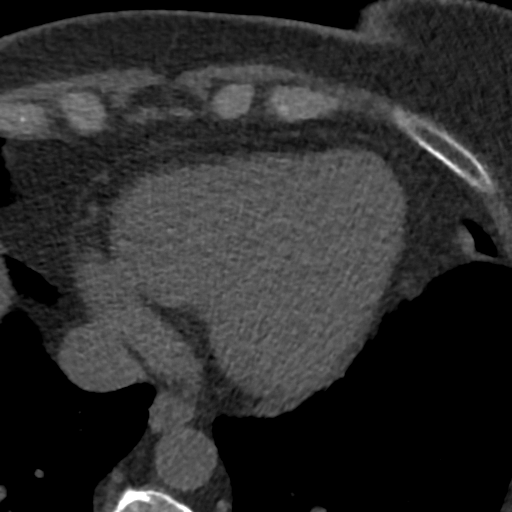
[im 13/37  vessel]
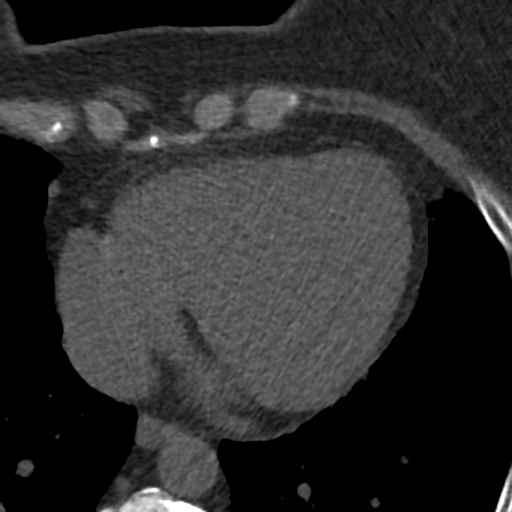
[im 17/37  vessel]
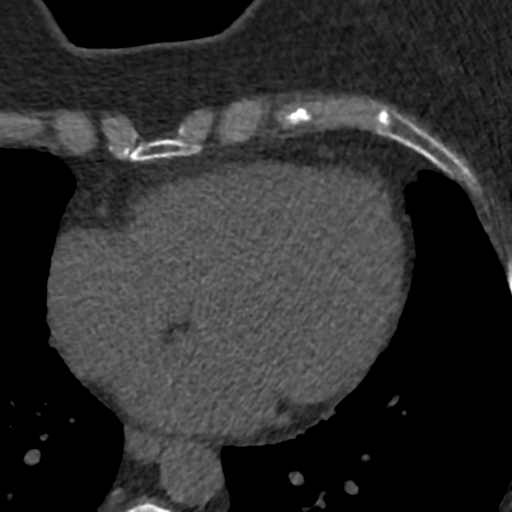
[im 21/37  vessel]
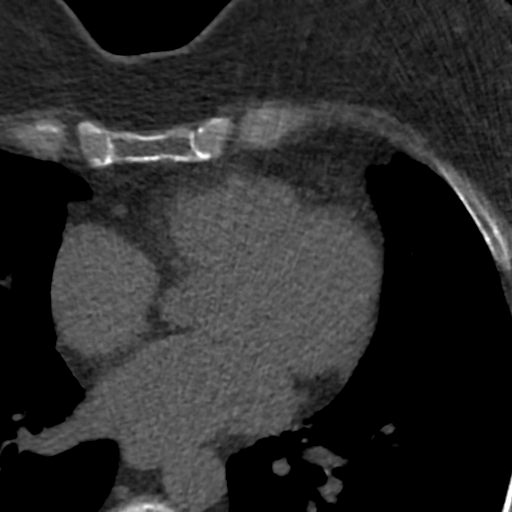
[im 21/37  lung]
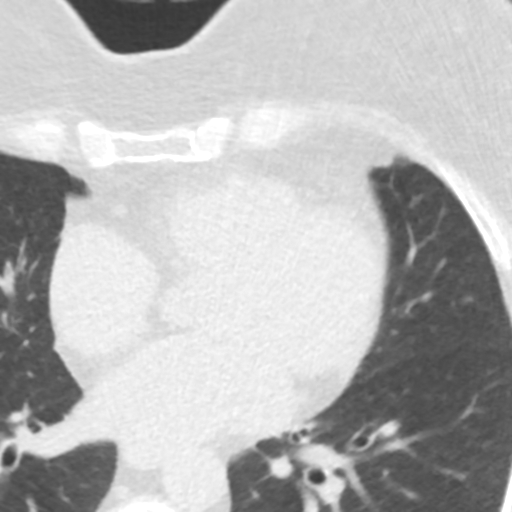
[im 25/37  vessel]
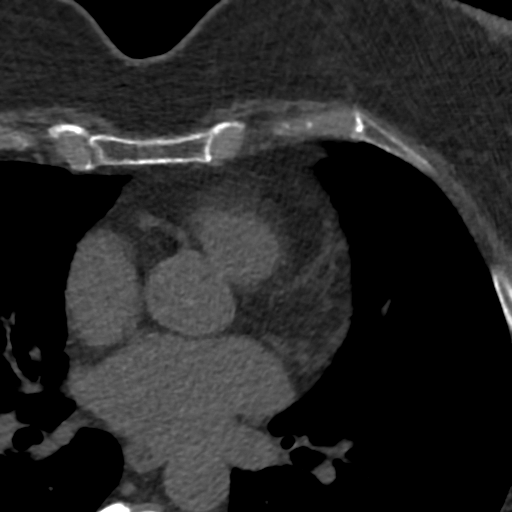
[im 29/37  vessel]
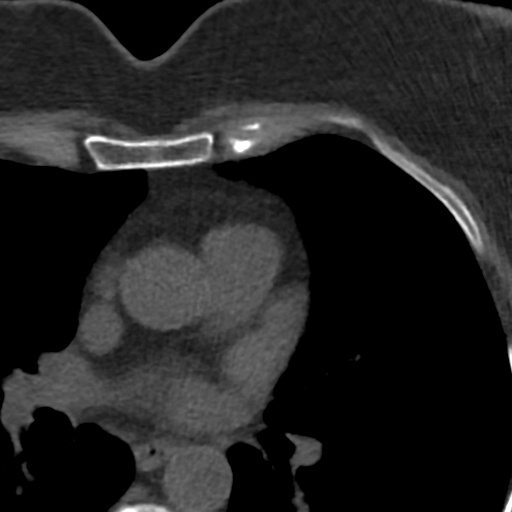
[im 33/37  vessel]
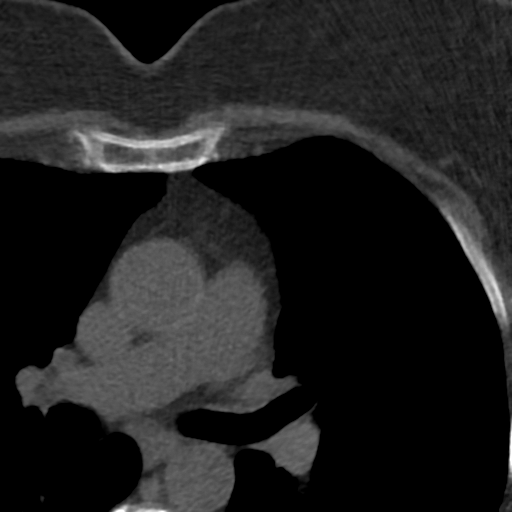

[Series 4: lung st 70 % · axial · 0.59mm/px · z∈[+1120,+1204]mm · 8 of 38 slices shown]
[im 5/38  lung]
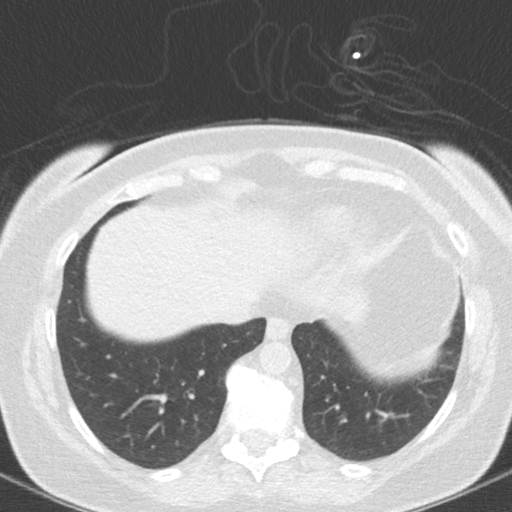
[im 9/38  lung]
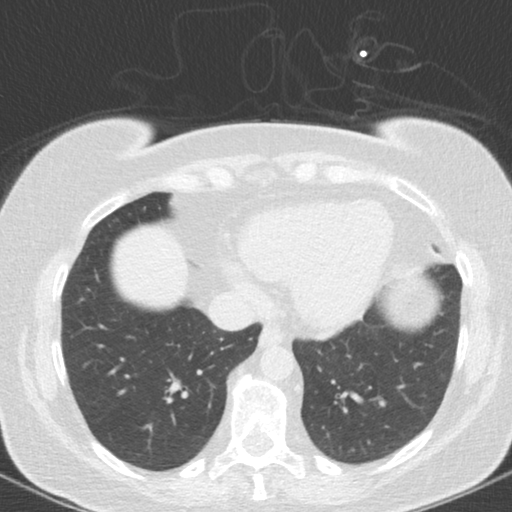
[im 13/38  lung]
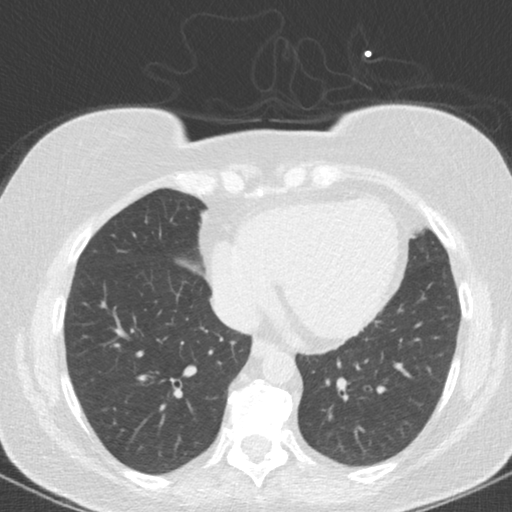
[im 17/38  lung]
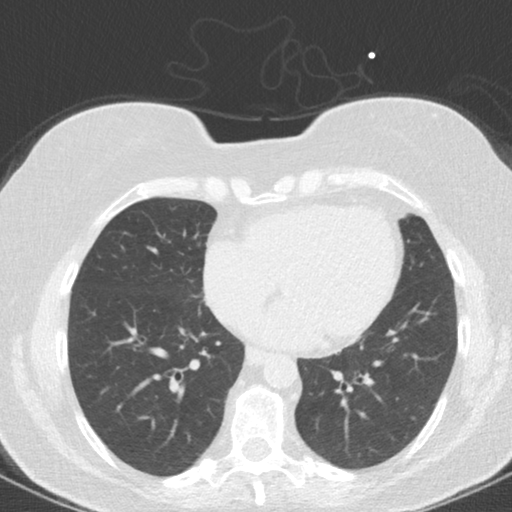
[im 21/38  lung]
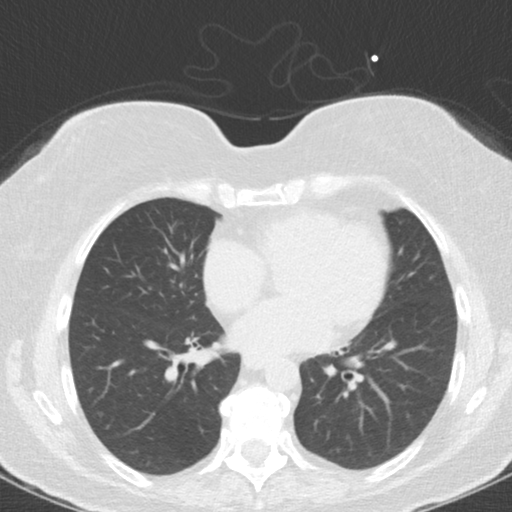
[im 25/38  lung]
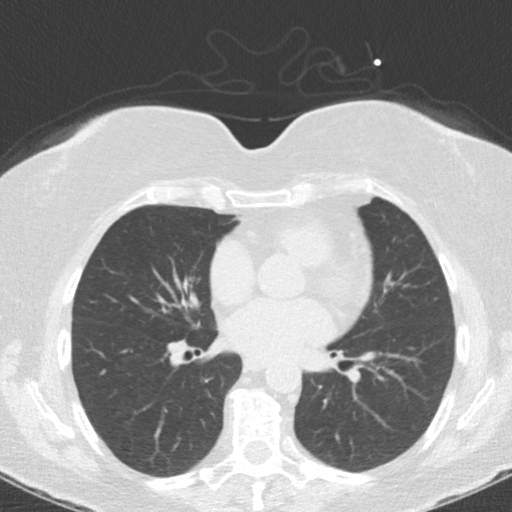
[im 29/38  lung]
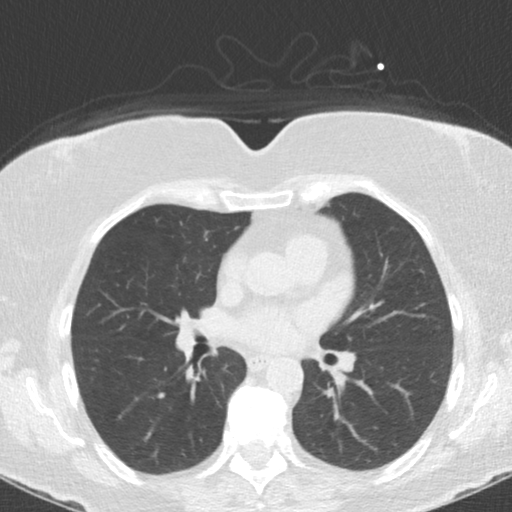
[im 33/38  lung]
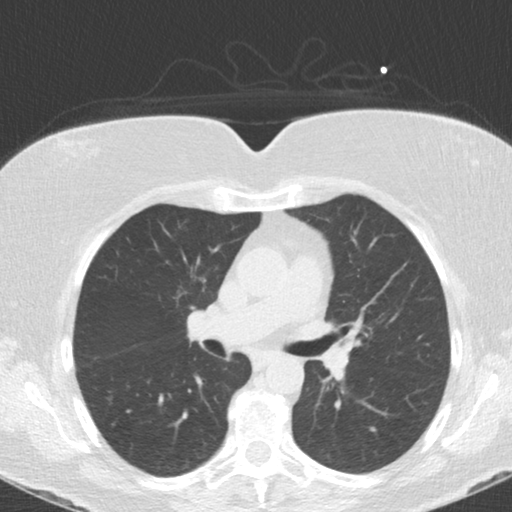

[16 of 20 positions shown; findings below may reference images not displayed]

FINDINGS: Non-cardiac: No significant non cardiac findings on limited lung and
soft tissue windows. See separate report from [REDACTED].

Ascending Aorta:  3.1 cm

Pericardium: Normal

Coronary arteries: Two small areas of punctate calcium in proximal
and mid LAD
IMPRESSION: Coronary calcium score of 5. This was 26th percentile for age and
sex matched control.

Andaya Fea

EXAM:
OVER-READ INTERPRETATION  CT CHEST

The following report is an over-read performed by radiologist Dr.
over-read does not include interpretation of cardiac or coronary
anatomy or pathology. The coronary calcium score interpretation by
the cardiologist is attached.
FINDINGS: Within the visualized portions of the thorax there are no suspicious
appearing pulmonary nodules or masses, there is no acute
consolidative airspace disease, no pleural effusions, no
pneumothorax and no lymphadenopathy. Visualized portions of the
upper abdomen demonstrate mild diffuse decreased attenuation
throughout the hepatic parenchyma, compatible with hepatic
steatosis. There are no aggressive appearing lytic or blastic
lesions noted in the visualized portions of the skeleton.
IMPRESSION: 1. Mild hepatic steatosis.

## 2017-08-26 ENCOUNTER — Ambulatory Visit: Payer: Medicare Other | Admitting: Gastroenterology

## 2017-08-26 DIAGNOSIS — R12 Heartburn: Secondary | ICD-10-CM | POA: Diagnosis not present

## 2017-08-26 NOTE — Progress Notes (Signed)
Cephas Darby, MD [address]  [place]  [place], [place] [address]  Main: [phone]  Fax: [phone]    Gastroenterology Consultation  Referring Provider:     Burnard Hawthorne, FNP Primary Care Physician:  Burnard Hawthorne, FNP Primary Gastroenterologist:  Dr. Cephas Darby Reason for Consultation:     GERD       HPI:   Mary Pacheco is a 76 y.o. y/o female referred by Dr. Burnard Hawthorne, FNP  for consultation & management of reflux. Burning in throat, on and off, most of the days in a week, Denies nocturnal symptoms Took protonix when she had gastric ulcer in 2004, then switched to nexium. Developed heart burn off PPI, restarted pantoprazole and her heart burn is under control. Certain foods, ETOH triggers heart burn. Denies nocturnal heart burn on PPI. Denies dysphagia, hematemesis, regurgitation, n/v/f/c. Does not smoke, NSIADs occasional. ETOH 1/day Has regular BMs, denies abd pain Taking probiotic daily  Follow up visit 08/26/17:  She stopped protonix and switched to pepcid 20mg  in late afternoon. Over all, it's helping but effect doesn't last longer, she does have intermittent break through episodes during night, about 3 times/week for which she is taking calcium+simethicone.  She is otherwise, feeling well.    Bleeding gastric ulcer in 2004 seen on EGD, cauterized. Follow up EGD confirmed healing. GI Procedures: Colonoscopy 05/18/2014 A. RECTUM, ENDOSCOPIC BIOPSY:   COLONIC MUCOSA WITH REACTIVE/REPARATIVE CHANGES.   NO EVIDENCE OF ADENOMATOUS MUCOSA.  Past Medical History:  Diagnosis Date  . Anxiety 2011   patient with anxiety will continue Alprazolam prn  . Arthritis   . GERD (gastroesophageal reflux disease)    pt. with esophagea reflux. She reports great improvement with Dexilant.  [name] Hearing loss    s/p hearing aids  . 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 Hyperlipidemia    patient with HL on statins. lipids well controlled  .  Hypertension     Past Surgical History:  Procedure Laterality Date  . ABDOMINAL HYSTERECTOMY  1993   multiple fibroid cysts  . cataract surgery  02/2011   bilateral  . TONSILLECTOMY      Prior to Admission medications   Medication Sig Start Date End Date Taking? Authorizing Provider  acyclovir (ZOVIRAX) 400 MG tablet TAKE 1 TABLET (400 MG TOTAL) BY MOUTH DAILY. 04/03/17   Burnard Hawthorne, FNP  ALPRAZolam Duanne Moron) 0.25 MG tablet TAKE 1 TABLET BY MOUTH 3 TIMES A DAY AS NEEDED FOR ANXIETY 03/16/16   Jackolyn Confer, MD  aspirin 81 MG tablet Take 81 mg by mouth daily.      [provider]  atorvastatin (LIPITOR) 20 MG tablet Take 1 tablet (20 mg total) by mouth daily. 04/05/17   Burnard Hawthorne, FNP  celecoxib (CELEBREX) 200 MG capsule TAKE 1 CAPSULE (200 MG TOTAL) BY MOUTH 2 (TWO) TIMES DAILY. 11/08/15   Jackolyn Confer, MD  cholecalciferol (VITAMIN D) 1000 UNITS tablet Take 1,000 Units by mouth daily.      [provider]  Coenzyme Q10 (EQL COQ10) 300 MG CAPS Take by mouth.    [provider]  CRANBERRY PO Take by mouth.    [provider]  Doxepin HCl 5 % CREA APPLY TO ARMS TWICE A DAY AS NEEDED FOR ITCHING 06/05/17   [provider]  Flaxseed, Linseed, (FLAX SEED OIL PO) Take 1,400 mg by mouth daily.    [provider]  hydrochlorothiazide (MICROZIDE) 12.5 MG  capsule TAKE 1 CAPSULE DAILY (MUST ESTABLISH WITH NEW PRIMARY CARE PHYSICIAN) 05/17/17   Burnard Hawthorne, FNP  levothyroxine (SYNTHROID, LEVOTHROID) 50 MCG tablet Take 1 tablet (50 mcg total) by mouth daily. 01/29/17   Burnard Hawthorne, FNP  Misc Natural Products (TOTAL CARDIO HEALTH FORMULA PO) Take 1 tablet by mouth daily.      [provider]  Omega-3 Fatty Acids (FISH OIL TRIPLE STRENGTH) 1400 MG CAPS Take by mouth.    [provider]  pantoprazole (PROTONIX) 40 MG tablet TAKE 1 TABLET (40 MG TOTAL) BY MOUTH DAILY. 05/27/17   Burnard Hawthorne, FNP    Probiotic Product (PROBIOTIC DAILY PO) Take by mouth.    [provider]  SINGULAIR 10 MG tablet Take 1 tablet (10 mg total) by mouth at bedtime. 04/05/17   Burnard Hawthorne, FNP  triamcinolone (KENALOG) 0.025 % cream APPLY TO LEGS TWICE A DAY AS NEEDED FOR ITCHING 06/04/17   [provider]  triamcinolone ointment (KENALOG) 0.1 % APPLY THIN FILM TO AFFECTED AREAS BY TOPICAL ROUTE AS DIRECTED FOR 7 DAYS AS NEEDED 10/06/14   Jackolyn Confer, MD    Family History  Problem Relation Age of Onset  . Heart failure Mother   . Heart disease Mother        CABG  . Hypertension Mother   . Hyperlipidemia Mother   . Stroke Mother        mini strokes, multiple  . Heart attack Father   . Heart failure Father   . Hyperlipidemia Father   . Hypertension Father   . Heart attack Sister 109       MI     Social History   Tobacco Use  . Smoking status: Never Smoker  . Smokeless tobacco: Never Used  Substance Use Topics  . Alcohol use: Yes    Comment: socially  . Drug use: No    Allergies as of 08/26/2017 - Review Complete 07/01/2017  Allergen Reaction Noted  . Molds & smuts  07/12/2011  . Rosuvastatin Hives 02/21/2016  . Simvastatin  10/06/2014    Review of Systems:    All systems reviewed and negative except where noted in HPI.   Physical Exam:  There were no vitals taken for this visit. No LMP recorded. Patient has had a hysterectomy.  General:   Alert,  Well-developed, well-nourished, pleasant and cooperative in NAD Head:  Normocephalic and atraumatic. Eyes:  Sclera clear, no icterus.   Conjunctiva pink. Ears:  Normal auditory acuity. Nose:  No deformity, discharge, or lesions. Mouth:  No deformity or lesions,oropharynx pink & moist. Neck:  Supple; no masses or thyromegaly. Lungs:  Respirations even and unlabored.  Clear throughout to auscultation.   No wheezes, crackles, or rhonchi. No acute distress. Heart:  Regular rate and rhythm; no murmurs, clicks,  rubs, or gallops. Abdomen:  Normal bowel sounds.  No bruits.  Soft, non-tender and non-distended without masses, hepatosplenomegaly or hernias noted.  No guarding or rebound tenderness.   Rectal: Nor performed Msk:  Symmetrical without gross deformities. Good, equal movement & strength bilaterally. Pulses:  Normal pulses noted. Extremities:  No clubbing or edema.  No cyanosis. Neurologic:  Alert and oriented x3;  grossly normal neurologically. Skin:  Intact without significant lesions or rashes. No jaundice. Psych:  Alert and cooperative. Normal mood and affect.  Imaging Studies:   Assessment and Plan:   Mary Pacheco is a 76 y.o. y/o female with History of peptic ulcer disease in  2004, confirmed healing, unknown H. pylori status, Chronic GERD, which was previously well controlled on Protonix 40 mg daily, since switching to pepcid, she is experiencing break through symptoms relieved with calcium. Patient does not demonstrate any alarm signs/symptoms to recommend EGD. Therefore, I suggested her to  1. Take pepcid at bedtime or Increase it to 2 times daily 2. If heart burn persists despite above, I will perform EGD and go back to protonix 40mg  daily. She does not have renal insufficiency or dementia  Follow up in 12month   Cephas Darby, MD

## 2017-09-09 ENCOUNTER — Ambulatory Visit: Payer: Medicare Other | Admitting: Gastroenterology

## 2017-09-23 ENCOUNTER — Other Ambulatory Visit: Payer: Self-pay | Admitting: Family

## 2017-09-24 ENCOUNTER — Ambulatory Visit: Payer: Medicare Other | Admitting: Gastroenterology

## 2017-09-24 ENCOUNTER — Encounter: Payer: Self-pay | Admitting: Gastroenterology

## 2017-09-24 VITALS — BP 144/72 | HR 69 | Temp 97.6°F | Ht 64.0 in | Wt 146.0 lb

## 2017-09-24 DIAGNOSIS — K219 Gastro-esophageal reflux disease without esophagitis: Secondary | ICD-10-CM | POA: Diagnosis not present

## 2017-09-24 NOTE — Progress Notes (Signed)
Cephas Darby, MD [address]  [place]  [place], [place] [address]  Main: [phone]  Fax: [phone]    Gastroenterology Consultation  Referring Provider:     Burnard Hawthorne, FNP Primary Care Physician:  Burnard Hawthorne, FNP Primary Gastroenterologist:  Dr. Cephas Darby Reason for Consultation:     GERD       HPI:   FRANCIE KEELING is a 76 y.o. y/o female referred by Dr. Burnard Hawthorne, FNP  for consultation & management of reflux. Burning in throat, on and off, most of the days in a week, Denies nocturnal symptoms Took protonix when she had gastric ulcer in 2004, then switched to nexium. Developed heart burn off PPI, restarted pantoprazole and her heart burn is under control. Certain foods, ETOH triggers heart burn. Denies nocturnal heart burn on PPI. Denies dysphagia, hematemesis, regurgitation, n/v/f/c. Does not smoke, NSIADs occasional. ETOH 1/day Has regular BMs, denies abd pain Taking probiotic daily  Follow up visit 08/26/17:  She stopped protonix and switched to pepcid 20mg  in late afternoon. Over all, it's helping but effect doesn't last longer, she does have intermittent break through episodes during night, about 3 times/week for which she is taking calcium+simethicone.  She is otherwise, feeling well.   Follow up visit 09/24/17:  Symptoms returned on pepcid, she went back to protonix and doing well. Denies any other complaints otherwise  Bleeding gastric ulcer in 2004 seen on EGD, cauterized. Follow up EGD confirmed healing. GI Procedures: Colonoscopy 05/18/2014 A. RECTUM, ENDOSCOPIC BIOPSY:   COLONIC MUCOSA WITH REACTIVE/REPARATIVE CHANGES.   NO EVIDENCE OF ADENOMATOUS MUCOSA.  Past Medical History:  Diagnosis Date  . Anxiety 2011   patient with anxiety will continue Alprazolam prn  . Arthritis   . GERD (gastroesophageal reflux disease)    pt. with esophagea reflux. She reports great improvement with Dexilant.  [name] Hearing  loss    s/p hearing aids  . 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 Hyperlipidemia    patient with HL on statins. lipids well controlled  . Hypertension     Past Surgical History:  Procedure Laterality Date  . ABDOMINAL HYSTERECTOMY  1993   multiple fibroid cysts  . cataract surgery  02/2011   bilateral  . TONSILLECTOMY      Prior to Admission medications   Medication Sig Start Date End Date Taking? Authorizing Provider  acyclovir (ZOVIRAX) 400 MG tablet TAKE 1 TABLET (400 MG TOTAL) BY MOUTH DAILY. 04/03/17   Burnard Hawthorne, FNP  ALPRAZolam Duanne Moron) 0.25 MG tablet TAKE 1 TABLET BY MOUTH 3 TIMES A DAY AS NEEDED FOR ANXIETY 03/16/16   Jackolyn Confer, MD  aspirin 81 MG tablet Take 81 mg by mouth daily.      [provider]  atorvastatin (LIPITOR) 20 MG tablet Take 1 tablet (20 mg total) by mouth daily. 04/05/17   Burnard Hawthorne, FNP  celecoxib (CELEBREX) 200 MG capsule TAKE 1 CAPSULE (200 MG TOTAL) BY MOUTH 2 (TWO) TIMES DAILY. 11/08/15   Jackolyn Confer, MD  cholecalciferol (VITAMIN D) 1000 UNITS tablet Take 1,000 Units by mouth daily.      [provider]  Coenzyme Q10 (EQL COQ10) 300 MG CAPS Take by mouth.    [provider]  CRANBERRY PO Take by mouth.    [provider]  Doxepin HCl 5 % CREA APPLY TO ARMS TWICE A DAY AS NEEDED FOR ITCHING 06/05/17   [provider]  Flaxseed,  Linseed, (FLAX SEED OIL PO) Take 1,400 mg by mouth daily.    [provider]  hydrochlorothiazide (MICROZIDE) 12.5 MG capsule TAKE 1 CAPSULE DAILY (MUST ESTABLISH WITH NEW PRIMARY CARE PHYSICIAN) 05/17/17   Burnard Hawthorne, FNP  levothyroxine (SYNTHROID, LEVOTHROID) 50 MCG tablet Take 1 tablet (50 mcg total) by mouth daily. 01/29/17   Burnard Hawthorne, FNP  Misc Natural Products (TOTAL CARDIO HEALTH FORMULA PO) Take 1 tablet by mouth daily.      [provider]  Omega-3 Fatty Acids (FISH OIL TRIPLE STRENGTH) 1400 MG CAPS Take by mouth.    [provider]  pantoprazole (PROTONIX) 40 MG tablet TAKE 1 TABLET (40 MG TOTAL) BY MOUTH DAILY. 05/27/17   Burnard Hawthorne, FNP  Probiotic Product (PROBIOTIC DAILY PO) Take by mouth.    [provider]  SINGULAIR 10 MG tablet Take 1 tablet (10 mg total) by mouth at bedtime. 04/05/17   Burnard Hawthorne, FNP  triamcinolone (KENALOG) 0.025 % cream APPLY TO LEGS TWICE A DAY AS NEEDED FOR ITCHING 06/04/17   [provider]  triamcinolone ointment (KENALOG) 0.1 % APPLY THIN FILM TO AFFECTED AREAS BY TOPICAL ROUTE AS DIRECTED FOR 7 DAYS AS NEEDED 10/06/14   Jackolyn Confer, MD    Family History  Problem Relation Age of Onset  . Heart failure Mother   . Heart disease Mother        CABG  . Hypertension Mother   . Hyperlipidemia Mother   . Stroke Mother        mini strokes, multiple  . Heart attack Father   . Heart failure Father   . Hyperlipidemia Father   . Hypertension Father   . Heart attack Sister 67       MI     Social History   Tobacco Use  . Smoking status: Never Smoker  . Smokeless tobacco: Never Used  Substance Use Topics  . Alcohol use: Yes    Comment: socially  . Drug use: No    Allergies as of 09/24/2017 - Review Complete 07/01/2017  Allergen Reaction Noted  . Molds & smuts  07/12/2011  . Rosuvastatin Hives 02/21/2016  . Simvastatin  10/06/2014    Review of Systems:    All systems reviewed and negative except where noted in HPI.   Physical Exam:  There were no vitals taken for this visit. No LMP recorded. Patient has had a hysterectomy.  General:   Alert,  Well-developed, well-nourished, pleasant and cooperative in NAD Head:  Normocephalic and atraumatic. Eyes:  Sclera clear, no icterus.   Conjunctiva pink. Ears:  Normal auditory acuity. Nose:  No deformity, discharge, or lesions. Mouth:  No deformity or lesions,oropharynx pink & moist. Neck:  Supple; no masses or thyromegaly. Lungs:  Respirations even and unlabored.  Clear throughout  to auscultation.   No wheezes, crackles, or rhonchi. No acute distress. Heart:  Regular rate and rhythm; no murmurs, clicks, rubs, or gallops. Abdomen:  Normal bowel sounds.  No bruits.  Soft, non-tender and non-distended without masses, hepatosplenomegaly or hernias noted.  No guarding or rebound tenderness.   Rectal: Nor performed Msk:  Symmetrical without gross deformities. Good, equal movement & strength bilaterally. Pulses:  Normal pulses noted. Extremities:  No clubbing or edema.  No cyanosis. Neurologic:  Alert and oriented x3;  grossly normal neurologically. Skin:  Intact without significant lesions or rashes. No jaundice. Psych:  Alert and cooperative. Normal mood and affect.  Imaging Studies:  Assessment and Plan:   STORMEE DUDA is a 76 y.o. y/o female with History of peptic ulcer disease in 2004, confirmed healing, unknown H. pylori status, Chronic GERD, which was previously well controlled on Protonix 40 mg daily, since switching to pepcid, she is experiencing break through symptoms relieved with calcium. Patient does not demonstrate any alarm signs/symptoms to recommend EGD. Currently, her symptoms are well controlled after going back to protonix 40mg  daily  Will perform EGD if symptoms recur on protonix  Follow up as needed   Cephas Darby, MD

## 2017-10-13 ENCOUNTER — Other Ambulatory Visit: Payer: Self-pay | Admitting: Family

## 2017-10-28 ENCOUNTER — Ambulatory Visit: Payer: Medicare Other | Admitting: Gastroenterology

## 2017-10-28 DIAGNOSIS — R109 Unspecified abdominal pain: Secondary | ICD-10-CM

## 2017-10-28 DIAGNOSIS — R140 Abdominal distension (gaseous): Secondary | ICD-10-CM | POA: Diagnosis not present

## 2017-10-28 NOTE — Progress Notes (Signed)
Mary Darby, MD [address]  [place]  [place], [place] [address]  Main: [phone]  Fax: [phone]    Gastroenterology Consultation  Referring Provider:     Burnard Hawthorne, FNP Primary Care Physician:  Burnard Hawthorne, FNP Primary Gastroenterologist:  Dr. Cephas Pacheco Reason for Consultation:  Abdominal bloating      HPI:   Mary Pacheco is a 77 y.o. y/o female referred by Dr. Burnard Hawthorne, FNP  for consultation & management of reflux. Burning in throat, on and off, most of the days in a week, Denies nocturnal symptoms Took protonix when she had gastric ulcer in 2004, then switched to nexium. Developed heart burn off PPI, restarted pantoprazole and her heart burn is under control. Certain foods, ETOH triggers heart burn. Denies nocturnal heart burn on PPI. Denies dysphagia, hematemesis, regurgitation, n/v/f/c. Does not smoke, NSIADs occasional. ETOH 1/day Has regular BMs, denies abd pain Taking probiotic daily  Follow up visit 08/26/17:  She stopped protonix and switched to pepcid 20mg  in late afternoon. Over all, it's helping but effect doesn't last longer, she does have intermittent break through episodes during night, about 3 times/week for which she is taking calcium+simethicone.  She is otherwise, feeling well.   Follow up visit 09/24/17:  Symptoms returned on pepcid, she went back to protonix and doing well. Denies any other complaints otherwise  Follow-up visit 10/28/2017: She denies any symptoms of acid reflux. She felt [place] 10[place]. She hurt her Buttocks, sat down but denies having any fracture, able to ambulate well. She was severely constipated for several days after the fall, relieved after taking laxatives from her sister. She now reports having regular bowel movements, consumes prunes daily. She started experiencing lower abdominal bloating since fall. She reports that she has been consuming lot of  sugars during holiday time and also artificial sweeteners in her coffee. She continues to take probiotics which hasn't improved her bloating. She is taking Protonix 40 mg daily as well. She also reports a knot sensation in her right inguinal area associated with cramps but denies any excruciating pain. The pain disappears when she lays flat.  Bleeding gastric ulcer in 2004 seen on EGD, cauterized. Follow up EGD confirmed healing. GI Procedures: Colonoscopy 05/18/2014 A. RECTUM, ENDOSCOPIC BIOPSY:   COLONIC MUCOSA WITH REACTIVE/REPARATIVE CHANGES.   NO EVIDENCE OF ADENOMATOUS MUCOSA.  Past Medical History:  Diagnosis Date  . Anxiety 2011   patient with anxiety will continue Alprazolam prn  . Arthritis   . GERD (gastroesophageal reflux disease)    pt. with esophagea reflux. She reports great improvement with Dexilant.  [name] Hearing loss    s/p hearing aids  . 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 Hyperlipidemia    patient with HL on statins. lipids well controlled  . Hypertension     Past Surgical History:  Procedure Laterality Date  . ABDOMINAL HYSTERECTOMY  1993   multiple fibroid cysts  . cataract surgery  02/2011   bilateral  . TONSILLECTOMY      Prior to Admission medications   Medication Sig Start Date End Date Taking? Authorizing Provider  acyclovir (ZOVIRAX) 400 MG tablet TAKE 1 TABLET (400 MG TOTAL) BY MOUTH DAILY. 04/03/17   Burnard Hawthorne, FNP  ALPRAZolam Duanne Moron) 0.25 MG tablet TAKE 1 TABLET BY MOUTH 3 TIMES A DAY AS NEEDED FOR ANXIETY 03/16/16   Jackolyn Confer, MD  aspirin 81 MG tablet Take  81 mg by mouth daily.      [provider]  atorvastatin (LIPITOR) 20 MG tablet Take 1 tablet (20 mg total) by mouth daily. 04/05/17   Burnard Hawthorne, FNP  celecoxib (CELEBREX) 200 MG capsule TAKE 1 CAPSULE (200 MG TOTAL) BY MOUTH 2 (TWO) TIMES DAILY. 11/08/15   Jackolyn Confer, MD  cholecalciferol (VITAMIN D) 1000 UNITS tablet Take 1,000 Units by mouth daily.       [provider]  Coenzyme Q10 (EQL COQ10) 300 MG CAPS Take by mouth.    [provider]  CRANBERRY PO Take by mouth.    [provider]  Doxepin HCl 5 % CREA APPLY TO ARMS TWICE A DAY AS NEEDED FOR ITCHING 06/05/17   [provider]  Flaxseed, Linseed, (FLAX SEED OIL PO) Take 1,400 mg by mouth daily.    [provider]  hydrochlorothiazide (MICROZIDE) 12.5 MG capsule TAKE 1 CAPSULE DAILY (MUST ESTABLISH WITH NEW PRIMARY CARE PHYSICIAN) 05/17/17   Burnard Hawthorne, FNP  levothyroxine (SYNTHROID, LEVOTHROID) 50 MCG tablet Take 1 tablet (50 mcg total) by mouth daily. 01/29/17   Burnard Hawthorne, FNP  Misc Natural Products (TOTAL CARDIO HEALTH FORMULA PO) Take 1 tablet by mouth daily.      [provider]  Omega-3 Fatty Acids (FISH OIL TRIPLE STRENGTH) 1400 MG CAPS Take by mouth.    [provider]  pantoprazole (PROTONIX) 40 MG tablet TAKE 1 TABLET (40 MG TOTAL) BY MOUTH DAILY. 05/27/17   Burnard Hawthorne, FNP  Probiotic Product (PROBIOTIC DAILY PO) Take by mouth.    [provider]  SINGULAIR 10 MG tablet Take 1 tablet (10 mg total) by mouth at bedtime. 04/05/17   Burnard Hawthorne, FNP  triamcinolone (KENALOG) 0.025 % cream APPLY TO LEGS TWICE A DAY AS NEEDED FOR ITCHING 06/04/17   [provider]  triamcinolone ointment (KENALOG) 0.1 % APPLY THIN FILM TO AFFECTED AREAS BY TOPICAL ROUTE AS DIRECTED FOR 7 DAYS AS NEEDED 10/06/14   Jackolyn Confer, MD    Family History  Problem Relation Age of Onset  . Heart failure Mother   . Heart disease Mother        CABG  . Hypertension Mother   . Hyperlipidemia Mother   . Stroke Mother        mini strokes, multiple  . Heart attack Father   . Heart failure Father   . Hyperlipidemia Father   . Hypertension Father   . Heart attack Sister 76       MI     Social History   Tobacco Use  . Smoking status: Never Smoker  . Smokeless tobacco: Never Used  Substance Use  Topics  . Alcohol use: Yes    Comment: socially  . Drug use: No    Allergies as of 10/28/2017 - Review Complete 09/24/2017  Allergen Reaction Noted  . Molds & smuts  07/12/2011  . Rosuvastatin Hives 02/21/2016  . Simvastatin  10/06/2014    Review of Systems:    All systems reviewed and negative except where noted in HPI.   Physical Exam:  There were no vitals taken for this visit. No LMP recorded. Patient has had a hysterectomy.  General:   Alert,  Well-developed, well-nourished, pleasant and cooperative in NAD Head:  Normocephalic and atraumatic. Eyes:  Sclera clear, no icterus.   Conjunctiva pink. Ears:  Normal auditory acuity. Nose:  No deformity, discharge, or lesions. Mouth:  No deformity or  lesions,oropharynx pink & moist. Neck:  Supple; no masses or thyromegaly. Lungs:  Respirations even and unlabored.  Clear throughout to auscultation.   No wheezes, crackles, or rhonchi. No acute distress. Heart:  Regular rate and rhythm; no murmurs, clicks, rubs, or gallops. Abdomen:  Normal bowel sounds.  No bruits.  Soft, non-tender and non-distended without masses, hepatosplenomegaly or hernias noted.  No guarding or rebound tenderness.  Inguinal hernia not appreciated Rectal: Nor performed Msk:  Symmetrical without gross deformities. Good, equal movement & strength bilaterally. Pulses:  Normal pulses noted. Extremities:  No clubbing or edema.  No cyanosis. Neurologic:  Alert and oriented x3;  grossly normal neurologically. Skin:  Intact without significant lesions or rashes. No jaundice. Psych:  Alert and cooperative. Normal mood and affect.  Imaging Studies:   Assessment and Plan:   Mary Pacheco is a 77 y.o. y/o female with History of peptic ulcer disease in 2004, confirmed healing, unknown H. pylori status, Chronic GERD, which is well controlled on Protonix 40 mg daily. She is here for follow-up of bloating for the last 2 weeks since after a fall on 10/06/17. Her symptoms  are most likely related to constipation and increased carbohydrate intake. She does not have any other constitutional symptoms.  - Encouraged her to have regular bowel movements - Encouraged her to avoid artificial sweeteners as well as cut back on consuming simple sugars - Hold off on probiotic for now - Can continue Protonix 40 mg daily  - If her bloating persists, we can try rifaximin for 2 weeks for empiric treatment of bacterial overgrowth  Follow up as needed   Mary Darby, MD

## 2017-11-04 ENCOUNTER — Encounter (INDEPENDENT_AMBULATORY_CARE_PROVIDER_SITE_OTHER): Payer: Self-pay | Admitting: Vascular Surgery

## 2017-11-04 ENCOUNTER — Ambulatory Visit (INDEPENDENT_AMBULATORY_CARE_PROVIDER_SITE_OTHER): Payer: Medicare Other | Admitting: Vascular Surgery

## 2017-11-04 ENCOUNTER — Ambulatory Visit (INDEPENDENT_AMBULATORY_CARE_PROVIDER_SITE_OTHER): Payer: Medicare Other

## 2017-11-04 VITALS — BP 154/65 | HR 68 | Resp 17 | Wt 146.6 lb

## 2017-11-04 DIAGNOSIS — I6523 Occlusion and stenosis of bilateral carotid arteries: Secondary | ICD-10-CM

## 2017-11-04 DIAGNOSIS — E782 Mixed hyperlipidemia: Secondary | ICD-10-CM | POA: Diagnosis not present

## 2017-11-04 DIAGNOSIS — I10 Essential (primary) hypertension: Secondary | ICD-10-CM

## 2017-11-04 DIAGNOSIS — K219 Gastro-esophageal reflux disease without esophagitis: Secondary | ICD-10-CM

## 2017-11-07 ENCOUNTER — Ambulatory Visit: Payer: Medicare Other | Admitting: Gastroenterology

## 2017-11-10 ENCOUNTER — Encounter (INDEPENDENT_AMBULATORY_CARE_PROVIDER_SITE_OTHER): Payer: Self-pay | Admitting: Vascular Surgery

## 2017-11-10 NOTE — Progress Notes (Signed)
MRN : [id number]  Mary Pacheco is a 77 y.o. (1941/07/24) female who presents with chief complaint of  Chief Complaint  Patient presents with  . Carotid    64yr follow up  .  History of Present Illness: The patient is seen for follow up evaluation of carotid stenosis. The carotid stenosis followed by ultrasound.   The patient denies amaurosis fugax. There is no recent history of TIA symptoms or focal motor deficits. There is no prior documented CVA.  The patient is taking enteric-coated aspirin 81 mg daily.  There is no history of migraine headaches. There is no history of seizures.  The patient has a history of coronary artery disease, no recent episodes of angina or shortness of breath. The patient denies PAD or claudication symptoms. There is a history of hyperlipidemia which is being treated with a statin.      Current Meds  Medication Sig  . ALPRAZolam (XANAX) 0.25 MG tablet TAKE 1 TABLET BY MOUTH 3 TIMES A DAY AS NEEDED FOR ANXIETY  . aspirin 81 MG tablet Take 81 mg by mouth daily.    [name] atorvastatin (LIPITOR) 20 MG tablet Take 1 tablet (20 mg total) by mouth daily.  . celecoxib (CELEBREX) 200 MG capsule TAKE 1 CAPSULE (200 MG TOTAL) BY MOUTH 2 (TWO) TIMES DAILY.  . cholecalciferol (VITAMIN D) 1000 UNITS tablet Take 1,000 Units by mouth daily.    . Coenzyme Q10 (EQL COQ10) 300 MG CAPS Take by mouth.  . Cranberry 400 MG CAPS Take by mouth.  . CRANBERRY PO Take by mouth.  . Doxepin HCl 5 % CREA APPLY TO ARMS TWICE A DAY AS NEEDED FOR ITCHING  . ezetimibe (ZETIA) 10 MG tablet Take 1 tablet (10 mg total) by mouth daily.  . Flaxseed, Linseed, (FLAX SEED OIL PO) Take 1,400 mg by mouth daily.  Nyoka Cowden Tea 150 MG CAPS Take by mouth.  . hydrochlorothiazide (MICROZIDE) 12.5 MG capsule TAKE 1 CAPSULE DAILY (MUST ESTABLISH WITH NEW PRIMARY CARE PHYSICIAN)  . levothyroxine (SYNTHROID, LEVOTHROID) 50 MCG tablet TAKE 1 TABLET DAILY  . Misc Natural Products (TOTAL CARDIO HEALTH  FORMULA PO) Take 1 tablet by mouth daily.    . Omega-3 Fatty Acids (FISH OIL TRIPLE STRENGTH) 1400 MG CAPS Take by mouth.  . pantoprazole (PROTONIX) 40 MG tablet TAKE 1 TABLET (40 MG TOTAL) BY MOUTH DAILY.  . Probiotic Product (PROBIOTIC DAILY PO) Take by mouth.  . rosuvastatin (CRESTOR) 10 MG tablet Take by mouth.  [name] SINGULAIR 10 MG tablet Take 1 tablet (10 mg total) by mouth at bedtime.  . triamcinolone (KENALOG) 0.025 % cream APPLY TO LEGS TWICE A DAY AS NEEDED FOR ITCHING  . triamcinolone ointment (KENALOG) 0.1 % APPLY THIN FILM TO AFFECTED AREAS BY TOPICAL ROUTE AS DIRECTED FOR 7 DAYS AS NEEDED    Past Medical History:  Diagnosis Date  . Anxiety 2011   patient with anxiety will continue Alprazolam prn  . Arthritis   . GERD (gastroesophageal reflux disease)    pt. with esophagea reflux. She reports great improvement with Dexilant.  [name] Hearing loss    s/p hearing aids  . 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 Hyperlipidemia    patient with HL on statins. lipids well controlled  . Hypertension     Past Surgical History:  Procedure Laterality Date  . ABDOMINAL HYSTERECTOMY  1993   multiple fibroid cysts  . cataract surgery  02/2011   bilateral  . TONSILLECTOMY  Social History Social History   Tobacco Use  . Smoking status: Never Smoker  . Smokeless tobacco: Never Used  Substance Use Topics  . Alcohol use: Yes    Comment: socially  . Drug use: No    Family History Family History  Problem Relation Age of Onset  . Heart failure Mother   . Heart disease Mother        CABG  . Hypertension Mother   . Hyperlipidemia Mother   . Stroke Mother        mini strokes, multiple  . Heart attack Father   . Heart failure Father   . Hyperlipidemia Father   . Hypertension Father   . Heart attack Sister 25       MI    Allergies  Allergen Reactions  . Molds & Smuts   . Rosuvastatin Hives  . Simvastatin     myalgia     REVIEW OF SYSTEMS (Negative unless  checked)  Constitutional: [] Weight loss  [] Fever  [] Chills Cardiac: [] Chest pain   [] Chest pressure   [] Palpitations   [] Shortness of breath when laying flat   [] Shortness of breath with exertion. Vascular:  [] Pain in legs with walking   [] Pain in legs at rest  [] History of DVT   [] Phlebitis   [] Swelling in legs   [] Varicose veins   [] Non-healing ulcers Pulmonary:   [] Uses home oxygen   [] Productive cough   [] Hemoptysis   [] Wheeze  [] COPD   [] Asthma Neurologic:  [] Dizziness   [] Seizures   [] History of stroke   [] History of TIA  [] Aphasia   [] Vissual changes   [] Weakness or numbness in arm   [] Weakness or numbness in leg Musculoskeletal:   [] Joint swelling   [] Joint pain   [] Low back pain Hematologic:  [] Easy bruising  [] Easy bleeding   [] Hypercoagulable state   [] Anemic Gastrointestinal:  [] Diarrhea   [] Vomiting  [] Gastroesophageal reflux/heartburn   [] Difficulty swallowing. Genitourinary:  [] Chronic kidney disease   [] Difficult urination  [] Frequent urination   [] Blood in urine Skin:  [] Rashes   [] Ulcers  Psychological:  [] History of anxiety   []  History of major depression.  Physical Examination  Vitals:   11/04/17 1130 11/04/17 1131  BP: (!) 177/62 (!) 154/65  Pulse: 68   Resp: 17   Weight: 146 lb 9.6 oz (66.5 kg)    Body mass index is 25.16 kg/m. Gen: WD/WN, NAD Head: Prien/AT, No temporalis wasting.  Ear/Nose/Throat: Hearing grossly intact, nares w/o erythema or drainage Eyes: PER, EOMI, sclera nonicteric.  Neck: Supple, no large masses.   Pulmonary:  Good air movement, no audible wheezing bilaterally, no use of accessory muscles.  Cardiac: RRR, no JVD Vascular: carotid bruit Vessel Right Left  Radial Palpable Palpable  Ulnar Palpable Palpable  Brachial Palpable Palpable  Carotid Palpable Palpable  Gastrointestinal: Non-distended. No guarding/no peritoneal signs.  Musculoskeletal: M/S 5/5 throughout.  No deformity or atrophy.  Neurologic: CN 2-12 intact. Symmetrical.  Speech  is fluent. Motor exam as listed above. Psychiatric: Judgment intact, Mood & affect appropriate for pt's clinical situation. Dermatologic: No rashes or ulcers noted.  No changes consistent with cellulitis. Lymph : No lichenification or skin changes of chronic lymphedema.  CBC Lab Results  Component Value Date   WBC 5.6 03/20/2013   HGB 15.9 (H) 03/20/2013   HCT 46.8 (H) 03/20/2013   MCV 87.5 03/20/2013   PLT 220.0 03/20/2013    BMET    Component Value Date/Time   NA 138 05/22/2017 1052  NA 140 12/28/2011 1819   K 4.4 05/22/2017 1052   K 3.2 (L) 12/28/2011 1819   CL 100 05/22/2017 1052   CL 104 12/28/2011 1819   CO2 28 05/22/2017 1052   CO2 27 12/28/2011 1819   GLUCOSE 115 (H) 05/22/2017 1052   GLUCOSE 98 12/28/2011 1819   BUN 18 05/22/2017 1052   BUN 13 12/28/2011 1819   CREATININE 0.90 05/22/2017 1052   CREATININE 1.00 11/06/2013 0854   CALCIUM 10.2 05/22/2017 1052   CALCIUM 9.1 12/28/2011 1819   GFRNONAA 56 (L) 11/06/2013 0854   GFRAA >60 11/06/2013 0854   CrCl cannot be calculated (Patient's most recent lab result is older than the maximum 21 days allowed.).  COAG No results found for: INR, PROTIME  Radiology No results found.  Assessment/Plan 1. Bilateral carotid artery stenosis Recommend:  Given the patient's asymptomatic subcritical stenosis no further invasive testing or surgery at this time.  Continue antiplatelet therapy as prescribed Continue management of CAD, HTN and Hyperlipidemia Healthy heart diet,  encouraged exercise at least 4 times per week Follow up in 6 months with duplex ultrasound and physical exam   - VAS US CAROTID; Future  2. Essential hypertension Continue antihypertensive medications as already ordered, these medications have been reviewed and there are no changes at this time.   3. Gastroesophageal reflux disease, esophagitis presence not specified Continue PPI as already ordered, these medications have been reviewed and  there are no changes at this time.   4. Mixed hyperlipidemia Continue statin as ordered and reviewed, no changes at this time     Hortencia Pilar, MD  11/10/2017 2:54 PM

## 2017-11-11 ENCOUNTER — Other Ambulatory Visit: Payer: Self-pay

## 2017-11-11 ENCOUNTER — Other Ambulatory Visit
Admission: RE | Admit: 2017-11-11 | Discharge: 2017-11-11 | Disposition: A | Discharge status: Home / Self Care | Payer: Medicare Other | Source: Ambulatory Visit | Attending: Gastroenterology | Admitting: Gastroenterology

## 2017-11-11 ENCOUNTER — Ambulatory Visit: Payer: Medicare Other | Admitting: Gastroenterology

## 2017-11-11 ENCOUNTER — Encounter: Payer: Self-pay | Admitting: Gastroenterology

## 2017-11-11 VITALS — BP 159/65 | HR 72 | Temp 98.5°F | Wt 146.4 lb

## 2017-11-11 DIAGNOSIS — R140 Abdominal distension (gaseous): Secondary | ICD-10-CM | POA: Diagnosis not present

## 2017-11-11 DIAGNOSIS — Z9181 History of falling: Secondary | ICD-10-CM

## 2017-11-11 DIAGNOSIS — I739 Peripheral vascular disease, unspecified: Secondary | ICD-10-CM

## 2017-11-11 LAB — CBC
HCT: 39.6 % (ref 35.0–47.0)
Hemoglobin: 13.3 g/dL (ref 12.0–16.0)
MCH: 30.6 pg (ref 26.0–34.0)
MCHC: 33.5 g/dL (ref 32.0–36.0)
MCV: 91.1 fL (ref 80.0–100.0)
Platelets: 255 10*3/uL (ref 150–440)
RBC: 4.35 MIL/uL (ref 3.80–5.20)
RDW: 13 % (ref 11.5–14.5)
WBC: 6.5 10*3/uL (ref 3.6–11.0)

## 2017-11-11 LAB — COMPREHENSIVE METABOLIC PANEL
ALBUMIN: 5 g/dL (ref 3.5–5.0)
ALT: 31 U/L (ref 14–54)
AST: 26 U/L (ref 15–41)
Alkaline Phosphatase: 105 U/L (ref 38–126)
Anion gap: 11 (ref 5–15)
BUN: 17 mg/dL (ref 6–20)
CHLORIDE: 100 mmol/L — AB (ref 101–111)
CO2: 27 mmol/L (ref 22–32)
CREATININE: 0.75 mg/dL (ref 0.44–1.00)
Calcium: 9.8 mg/dL (ref 8.9–10.3)
GFR calc Af Amer: >60 mL/min (ref >60)
GFR calc non Af Amer: >60 mL/min (ref >60)
GLUCOSE: 105 mg/dL — AB (ref 65–99)
Potassium: 3.9 mmol/L (ref 3.5–5.1)
Sodium: 138 mmol/L (ref 135–145)
Total Bilirubin: 0.7 mg/dL (ref 0.3–1.2)
Total Protein: 7.7 g/dL (ref 6.5–8.1)

## 2017-11-11 LAB — VITAMIN B12: Vitamin B-12: 243 pg/mL (ref 180–914)

## 2017-11-11 NOTE — Progress Notes (Signed)
Cephas Darby, MD [address]  [place], [place] [address]  Main: [phone]  Fax: [phone]    Gastroenterology Consultation  Referring Provider:     Burnard Hawthorne, FNP Primary Care Physician:  Burnard Hawthorne, FNP Primary Gastroenterologist:  Dr. Cephas Darby Reason for Consultation:  Abdominal bloating      HPI:   Mary Pacheco is a 77 y.o. y/o female referred by Dr. Burnard Hawthorne, FNP  for consultation & management of reflux. Burning in throat, on and off, most of the days in a week, Denies nocturnal symptoms Took protonix when she had gastric ulcer in 2004, then switched to nexium. Developed heart burn off PPI, restarted pantoprazole and her heart burn is under control. Certain foods, ETOH triggers heart burn. Denies nocturnal heart burn on PPI. Denies dysphagia, hematemesis, regurgitation, n/v/f/c. Does not smoke, NSIADs occasional. ETOH 1/day Has regular BMs, denies abd pain Taking probiotic daily  Follow up visit 08/26/17:  She stopped protonix and switched to pepcid 20mg  in late afternoon. Over all, it's helping but effect doesn't last longer, she does have intermittent break through episodes during night, about 3 times/week for which she is taking calcium+simethicone.  She is otherwise, feeling well.   Follow up visit 09/24/17:  Symptoms returned on pepcid, she went back to protonix and doing well. Denies any other complaints otherwise  Follow-up visit 10/28/2017: She denies any symptoms of acid reflux. She felt outside [place] on 10[place]06[place]2017. She hurt her Buttocks, sat down but denies having any fracture, able to ambulate well. She was severely constipated for several days after the fall, relieved after taking laxatives from her sister. She now reports having regular bowel movements, consumes prunes daily. She started experiencing lower abdominal bloating since fall. She reports that she has been consuming lot of  sugars during holiday time and also artificial sweeteners in her coffee. She continues to take probiotics which hasn't improved her bloating. She is taking Protonix 40 mg daily as well. She also reports a knot sensation in her right inguinal area associated with cramps but denies any excruciating pain. The pain disappears when she lays flat.  Follow-up visit 11/11/2017: Since last visit, patient stopped probiotics and her bloating resolved. She reports having regular bowel movements. She still has some pain in her right pelvis secondary to fall but she reports that it has significantly improved. She has been taking Tylenol and Celebrex as needed. She is on Protonix 40 mg daily with no reflux symptoms.  Bleeding gastric ulcer in 2004 seen on EGD, cauterized. Follow up EGD confirmed healing. GI Procedures: Colonoscopy 05/18/2014 A. RECTUM, ENDOSCOPIC BIOPSY:   COLONIC MUCOSA WITH REACTIVE/REPARATIVE CHANGES.   NO EVIDENCE OF ADENOMATOUS MUCOSA.  Past Medical History:  Diagnosis Date  . Anxiety 2011   patient with anxiety will continue Alprazolam prn  . Arthritis   . GERD (gastroesophageal reflux disease)    pt. with esophagea reflux. She reports great improvement with Dexilant.  [name] Hearing loss    s/p hearing aids  . 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 Hyperlipidemia    patient with HL on statins. lipids well controlled  . Hypertension     Past Surgical History:  Procedure Laterality Date  . ABDOMINAL HYSTERECTOMY  1993   multiple fibroid cysts  . cataract surgery  02/2011   bilateral  . TONSILLECTOMY      Prior to Admission medications   Medication Sig Start  Date End Date Taking? Authorizing Provider  acyclovir (ZOVIRAX) 400 MG tablet TAKE 1 TABLET (400 MG TOTAL) BY MOUTH DAILY. 04/03/17   Burnard Hawthorne, FNP  ALPRAZolam Duanne Moron) 0.25 MG tablet TAKE 1 TABLET BY MOUTH 3 TIMES A DAY AS NEEDED FOR ANXIETY 03/16/16   Jackolyn Confer, MD  aspirin 81 MG tablet Take 81 mg by  mouth daily.      [provider]  atorvastatin (LIPITOR) 20 MG tablet Take 1 tablet (20 mg total) by mouth daily. 04/05/17   Burnard Hawthorne, FNP  celecoxib (CELEBREX) 200 MG capsule TAKE 1 CAPSULE (200 MG TOTAL) BY MOUTH 2 (TWO) TIMES DAILY. 11/08/15   Jackolyn Confer, MD  cholecalciferol (VITAMIN D) 1000 UNITS tablet Take 1,000 Units by mouth daily.      [provider]  Coenzyme Q10 (EQL COQ10) 300 MG CAPS Take by mouth.    [provider]  CRANBERRY PO Take by mouth.    [provider]  Doxepin HCl 5 % CREA APPLY TO ARMS TWICE A DAY AS NEEDED FOR ITCHING 06/05/17   [provider]  Flaxseed, Linseed, (FLAX SEED OIL PO) Take 1,400 mg by mouth daily.    [provider]  hydrochlorothiazide (MICROZIDE) 12.5 MG capsule TAKE 1 CAPSULE DAILY (MUST ESTABLISH WITH NEW PRIMARY CARE PHYSICIAN) 05/17/17   Burnard Hawthorne, FNP  levothyroxine (SYNTHROID, LEVOTHROID) 50 MCG tablet Take 1 tablet (50 mcg total) by mouth daily. 01/29/17   Burnard Hawthorne, FNP  Misc Natural Products (TOTAL CARDIO HEALTH FORMULA PO) Take 1 tablet by mouth daily.      [provider]  Omega-3 Fatty Acids (FISH OIL TRIPLE STRENGTH) 1400 MG CAPS Take by mouth.    [provider]  pantoprazole (PROTONIX) 40 MG tablet TAKE 1 TABLET (40 MG TOTAL) BY MOUTH DAILY. 05/27/17   Burnard Hawthorne, FNP  Probiotic Product (PROBIOTIC DAILY PO) Take by mouth.    [provider]  SINGULAIR 10 MG tablet Take 1 tablet (10 mg total) by mouth at bedtime. 04/05/17   Burnard Hawthorne, FNP  triamcinolone (KENALOG) 0.025 % cream APPLY TO LEGS TWICE A DAY AS NEEDED FOR ITCHING 06/04/17   [provider]  triamcinolone ointment (KENALOG) 0.1 % APPLY THIN FILM TO AFFECTED AREAS BY TOPICAL ROUTE AS DIRECTED FOR 7 DAYS AS NEEDED 10/06/14   Jackolyn Confer, MD    Family History  Problem Relation Age of Onset  . Heart failure Mother   . Heart disease Mother          CABG  . Hypertension Mother   . Hyperlipidemia Mother   . Stroke Mother        mini strokes, multiple  . Heart attack Father   . Heart failure Father   . Hyperlipidemia Father   . Hypertension Father   . Heart attack Sister 69       MI     Social History   Tobacco Use  . Smoking status: Never Smoker  . Smokeless tobacco: Never Used  Substance Use Topics  . Alcohol use: Yes    Comment: socially  . Drug use: No    Allergies as of 11/11/2017 - Review Complete 11/11/2017  Allergen Reaction Noted  . Molds & smuts  07/12/2011  . Rosuvastatin Hives 02/21/2016  . Simvastatin  10/06/2014    Review of Systems:    All systems reviewed and negative except where noted in HPI.   Physical Exam:  BP (!) 159/65   Pulse 72   Temp 98.5 F (36.9 C) (Oral)   Wt 146 lb 6.4 oz (66.4 kg)   BMI 25.13 kg/m  No LMP recorded. Patient has had a hysterectomy.  General:   Alert,  Well-developed, well-nourished, pleasant and cooperative in NAD Head:  Normocephalic and atraumatic. Eyes:  Sclera clear, no icterus.   Conjunctiva pink. Ears:  Normal auditory acuity. Nose:  No deformity, discharge, or lesions. Mouth:  No deformity or lesions,oropharynx pink & moist. Neck:  Supple; no masses or thyromegaly. Lungs:  Respirations even and unlabored.  Clear throughout to auscultation.   No wheezes, crackles, or rhonchi. No acute distress. Heart:  Regular rate and rhythm; no murmurs, clicks, rubs, or gallops. Abdomen:  Normal bowel sounds.  No bruits.  Soft, non-tender and non-distended without masses, hepatosplenomegaly or hernias noted.  No guarding or rebound tenderness.  Inguinal hernia not appreciated Rectal: Nor performed Msk:  Symmetrical without gross deformities. Good, equal movement & strength bilaterally. Pulses:  Normal pulses noted. Extremities:  No clubbing or edema.  No cyanosis. Neurologic:  Alert and oriented x3;  grossly normal neurologically. Skin:  Intact without  significant lesions or rashes. No jaundice. Psych:  Alert and cooperative. Normal mood and affect.  Imaging Studies:   Assessment and Plan:   Mary Pacheco is a 77 y.o. y/o female with History of peptic ulcer disease in 2004, confirmed healing, unknown H. pylori status, Chronic GERD, which is well controlled on Protonix 40 mg daily. She is here for follow-up of bloating and constipation since after a fall on 10/06/17. Her symptoms were most likely related to constipation and increased carbohydrate intake. She does not have any other constitutional symptoms. Her symptoms have currently resolved  - Encouraged her to have regular bowel movements - Encouraged her to avoid artificial sweeteners as well as cut back on consuming simple sugars - No need for probiotics - Can continue Protonix 40 mg daily  - Update labs, ordered CBC, CMP, vitamin D and vitamin B12  Follow up as needed   Cephas Darby, MD

## 2017-11-12 LAB — VITAMIN D 25 HYDROXY (VIT D DEFICIENCY, FRACTURES): Vit D, 25-Hydroxy: 40.8 ng/mL (ref 30.0–100.0)

## 2017-11-18 ENCOUNTER — Telehealth: Payer: Self-pay | Admitting: Family

## 2017-11-18 NOTE — Telephone Encounter (Signed)
Copied from [place] [id number]. Topic: Quick Communication - See Telephone Encounter >> Nov 18, 2017  4:49 PM Bea Graff, NT wrote: CRM for notification. See Telephone encounter for: [facility] pts  pantoprazole (PROTONIX) in the mail and told her to call and request [facility] [place] [facility] can get the medication to her.  11/18/17.

## 2017-11-18 NOTE — Telephone Encounter (Signed)
Pantoprazole refill. Pt is stating [facility] lost the medication in the mail and asked the pt to contact the office to see a a [facility] [place]: 05/22/17 with Truddie Crumble Last Refill:05/22/17 per previous encounter on 05/27/17 [facility] [place]

## 2017-11-19 NOTE — Telephone Encounter (Signed)
Advised patient to contact GI for refill , since she had been referred to GI.  Patients PCP is out of office.  Patient has follow up in 2/19.

## 2017-11-25 ENCOUNTER — Ambulatory Visit: Payer: Medicare Other | Admitting: Family

## 2017-11-25 ENCOUNTER — Ambulatory Visit (INDEPENDENT_AMBULATORY_CARE_PROVIDER_SITE_OTHER): Payer: Medicare Other

## 2017-11-25 VITALS — BP 136/68 | HR 70 | Temp 98.0°F | Resp 14 | Ht 63.0 in | Wt 146.8 lb

## 2017-11-25 DIAGNOSIS — Z1331 Encounter for screening for depression: Secondary | ICD-10-CM

## 2017-11-25 DIAGNOSIS — Z0000 Encounter for general adult medical examination without abnormal findings: Secondary | ICD-10-CM

## 2017-11-25 NOTE — Progress Notes (Signed)
Subjective:   Mary Pacheco is a 77 y.o. female who presents for Medicare Annual (Subsequent) preventive examination.  Review of Systems:  No ROS.  Medicare Wellness Visit. Additional risk factors are reflected in the social history.  Cardiac Risk Factors include: advanced age (>29men, >26 women);hypertension     Objective:     Vitals: BP 136/68 (BP Location: Left Arm, Patient Position: Sitting, Cuff Size: Normal)   Pulse 70   Temp 98 F (36.7 C) (Oral)   Resp 14   Ht 5\' 3"  (1.6 m)   Wt 146 lb 12.8 oz (66.6 kg)   SpO2 97%   BMI 26.00 kg/m   Body mass index is 26 kg/m.  Advanced Directives 11/25/2017 11/22/2016 10/29/2016 03/09/2015  Does Patient Have a Medical Advance Directive? Yes Yes Yes Yes  Type of Paramedic of [place];Living will Living will;Healthcare Power of [place];Living will [place];Living will  Does patient want to make changes to medical advance directive? No - Patient declined No - Patient declined - No - Patient declined  Copy of [place] in Chart? No - copy requested No - copy requested - No - copy requested    Tobacco Social History   Tobacco Use  Smoking Status Never Smoker  Smokeless Tobacco Never Used     Counseling given: Not Answered   Clinical Intake:  Pre-visit preparation completed: No  Pain : No/denies pain     Nutritional Status: BMI 25 -29 Overweight Diabetes: No  How often do you need to have someone help you when you read instructions, pamphlets, or other written materials from your doctor or pharmacy?: 1 - Never  Interpreter Needed?: No     Past Medical History:  Diagnosis Date  . Anxiety 2011   patient with anxiety will continue Alprazolam prn  . Arthritis   . GERD (gastroesophageal reflux disease)    pt. with esophagea reflux. She reports great improvement with Dexilant.  [name] Hearing loss    s/p hearing aids  . 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 Hyperlipidemia    patient with HL on statins. lipids well controlled  . Hypertension    Past Surgical History:  Procedure Laterality Date  . ABDOMINAL HYSTERECTOMY  1993   multiple fibroid cysts  . cataract surgery  02/2011   bilateral  . TONSILLECTOMY     Family History  Problem Relation Age of Onset  . Heart failure Mother   . Heart disease Mother        CABG  . Hypertension Mother   . Hyperlipidemia Mother   . Stroke Mother        mini strokes, multiple  . Heart attack Father   . Heart failure Father   . Hyperlipidemia Father   . Hypertension Father   . Heart attack Sister 51       MI   Social History   Socioeconomic History  . Marital status: Married    Spouse name: None  . Number of children: None  . Years of education: None  . Highest education level: None  Social Needs  . Financial resource strain: None  . Food insecurity - worry: None  . Food insecurity - inability: None  . Transportation needs - medical: No  . Transportation needs - non-medical: No  Occupational History  . None  Tobacco Use  . Smoking status: Never Smoker  . Smokeless tobacco: Never Used  Substance and Sexual  Activity  . Alcohol use: Yes    Comment: socially  . Drug use: No  . Sexual activity: Yes  Other Topics Concern  . None  Social History Narrative   From [employer]      Retired Education officer, museum      married    Outpatient Encounter Medications as of 11/25/2017  Medication Sig  . acyclovir (ZOVIRAX) 400 MG tablet Take 400 mg by mouth daily.  [name] ALPRAZolam (XANAX) 0.25 MG tablet TAKE 1 TABLET BY MOUTH 3 TIMES A DAY AS NEEDED FOR ANXIETY  . aspirin 81 MG tablet Take 81 mg by mouth daily.    [name] atorvastatin (LIPITOR) 20 MG tablet Take 1 tablet (20 mg total) by mouth daily.  . celecoxib (CELEBREX) 200 MG capsule TAKE 1 CAPSULE (200 MG TOTAL) BY MOUTH 2 (TWO) TIMES DAILY.  . cholecalciferol (VITAMIN D) 1000 UNITS tablet Take 1,000 Units by mouth  daily.    . Coenzyme Q10 (EQL COQ10) 300 MG CAPS Take by mouth.  . Cranberry 400 MG CAPS Take by mouth.  . CRANBERRY PO Take by mouth.  . Doxepin HCl 5 % CREA APPLY TO ARMS TWICE A DAY AS NEEDED FOR ITCHING  . Flaxseed, Linseed, (FLAX SEED OIL PO) Take 1,400 mg by mouth daily.  Nyoka Cowden Tea 150 MG CAPS Take by mouth.  . hydrochlorothiazide (MICROZIDE) 12.5 MG capsule TAKE 1 CAPSULE DAILY (MUST ESTABLISH WITH NEW PRIMARY CARE PHYSICIAN)  . levothyroxine (SYNTHROID, LEVOTHROID) 50 MCG tablet TAKE 1 TABLET DAILY  . Misc Natural Products (TOTAL CARDIO HEALTH FORMULA PO) Take 1 tablet by mouth daily.    . Omega-3 Fatty Acids (FISH OIL TRIPLE STRENGTH) 1400 MG CAPS Take by mouth.  . pantoprazole (PROTONIX) 40 MG tablet TAKE 1 TABLET (40 MG TOTAL) BY MOUTH DAILY.  . Probiotic Product (PROBIOTIC DAILY PO) Take by mouth.  [name] SINGULAIR 10 MG tablet Take 1 tablet (10 mg total) by mouth at bedtime.  . triamcinolone (KENALOG) 0.025 % cream APPLY TO LEGS TWICE A DAY AS NEEDED FOR ITCHING  . triamcinolone ointment (KENALOG) 0.1 % APPLY THIN FILM TO AFFECTED AREAS BY TOPICAL ROUTE AS DIRECTED FOR 7 DAYS AS NEEDED  . [DISCONTINUED] ezetimibe (ZETIA) 10 MG tablet Take 1 tablet (10 mg total) by mouth daily.  . [DISCONTINUED] rosuvastatin (CRESTOR) 10 MG tablet Take by mouth.   No facility-administered encounter medications on file as of 11/25/2017.     Activities of Daily Living In your present state of health, do you have any difficulty performing the following activities: 11/25/2017  Hearing? Y  Vision? N  Difficulty concentrating or making decisions? N  Walking or climbing stairs? N  Dressing or bathing? N  Doing errands, shopping? N  Preparing Food and eating ? N  Using the Toilet? N  In the past six months, have you accidently leaked urine? N  Do you have problems with loss of bowel control? N  Managing your Medications? N  Managing your Finances? N  Housekeeping or managing your Housekeeping? Y    Comment Housekeeping assists   Some recent data might be hidden    Patient Care Team: Burnard Hawthorne, FNP as PCP - General (Family Medicine) Minna Merritts, MD as Consulting Physician (Cardiology) Schnier, Dolores Lory, MD (Vascular Surgery) Margaretha Sheffield, MD (Otolaryngology)    Assessment:   This is a routine wellness examination for Hawley. The goal of the wellness visit is to assist the patient how to close the gaps in care  and create a preventative care plan for the patient.   The roster of all physicians providing medical care to patient is listed in the Snapshot section of the chart.  Taking calcium VIT D as appropriate/Osteoporosis risk reviewed.    Safety issues reviewed; Smoke and carbon monoxide detectors in the home. No firearms in the home.  Wears seatbelts when driving or riding with others. Patient does wear sunscreen or protective clothing when in direct sunlight. No violence in the home.  Depression- PHQ 2 &9 complete.  No signs/symptoms or verbal communication regarding little pleasure in doing things, feeling down, depressed or hopeless. No changes in sleeping, energy, eating, concentrating.  No thoughts of self harm or harm towards others.  Time spent on this topic is 10 minutes.   Patient is alert, normal appearance, oriented to person/place/and time. Correctly identified the president of the [place], recall of 2/3 words, and performing simple calculations. Displays appropriate judgement and can read correct time from watch face.   No new identified risk were noted.  No failures at ADL's or IADL's.    BMI- discussed the importance of a healthy diet, water intake and the benefits of aerobic exercise. Educational material provided.   24 hour diet recall: Low carb/sugar free diet  Daily fluid intake: 0 cups of caffeine,  cups of water  Dental- every 6 months.  Eye- Visual acuity not assessed per patient preference since they have regular follow up with the  ophthalmologist.  Wears corrective lenses.  Sleep patterns- Sleeps 6-7 hours at night.  Wakes feeling rested.   Health maintenance gaps- closed.  Patient Concerns: None at this time. Follow up with PCP as needed.  Exercise Activities and Dietary recommendations Current Exercise Habits: Home exercise routine, Type of exercise: walking, Time (Minutes): 20, Frequency (Times/Week): 4, Weekly Exercise (Minutes/Week): 80, Intensity: Mild  Goals    . Healthy Lifestyle     Stay hydrated Low carb diet Exercise       Fall Risk Fall Risk  11/25/2017 11/22/2016 11/22/2016 05/22/2016 03/09/2015  Falls in the past year? Yes No No No No  Number falls in past yr: 1 - - - -  Injury with Fall? No - - - -  Comment Slipped on the pavement during winter weather  - - - -   Depression Screen PHQ 2/9 Scores 11/25/2017 11/22/2016 11/22/2016 05/22/2016  PHQ - 2 Score 0 0 0 0     Cognitive Function MMSE - Mini Mental State Exam 11/25/2017 11/22/2016 03/09/2015  Orientation to time 5 5 5   Orientation to Place 5 5 5   Registration 3 3 3   Attention/ Calculation 5 5 5   Recall 2 3 3   Language- name 2 objects 2 2 2   Language- repeat 1 1 1   Language- follow 3 step command 3 3 3   Language- read & follow direction 1 1 1   Write a sentence 1 1 1   Copy design 1 1 1   Total score 29 30 30         Immunization History  Administered Date(s) Administered  . Influenza Split 07/12/2011, 09/15/2012, 08/06/2014  . Influenza, High Dose Seasonal PF 07/18/2016  . Influenza-Unspecified 07/31/2013, 07/15/2017  . Pneumococcal Conjugate-13 04/06/2014  . Pneumococcal Polysaccharide-23 03/20/2009  . Tdap 04/06/2014  . Zoster 07/25/2013   Screening Tests Health Maintenance  Topic Date Due  . MAMMOGRAM  06/26/2018  . TETANUS/TDAP  04/06/2024  . INFLUENZA VACCINE  Completed  . DEXA SCAN  Completed       Plan:  End of life planning; Advance aging; Advanced directives discussed. Copy of current HCPOA/Living Will requested.      I have personally reviewed and noted the following in the patient's chart:   . Medical and social history . Use of alcohol, tobacco or illicit drugs  . Current medications and supplements . Functional ability and status . Nutritional status . Physical activity . Advanced directives . List of other physicians . Hospitalizations, surgeries, and ER visits in previous 12 months . Vitals . Screenings to include cognitive, depression, and falls . Referrals and appointments  In addition, I have reviewed and discussed with patient certain preventive protocols, quality metrics, and best practice recommendations. A written personalized care plan for preventive services as well as general preventive health recommendations were provided to patient.     Varney Biles, LPN  10/27/411

## 2017-11-25 NOTE — Patient Instructions (Addendum)
  Mary Pacheco , Thank you for taking time to come for your Medicare Wellness Visit. I appreciate your ongoing commitment to your health goals. Please review the following plan we discussed and let me know if I can assist you in the future.   Follow up with Mable Paris,  FNP as needed.    Bring a copy of your [place] and/or Living Will to be scanned into chart.  Have a great day!  These are the goals we discussed: Goals    . Healthy Lifestyle     Stay hydrated Low carb diet Exercise       This is a list of the screening recommended for you and due dates:  Health Maintenance  Topic Date Due  . Mammogram  06/26/2018  . Tetanus Vaccine  04/06/2024  . Flu Shot  Completed  . DEXA scan (bone density measurement)  Completed

## 2017-12-09 ENCOUNTER — Encounter: Payer: Self-pay | Admitting: Family

## 2017-12-09 ENCOUNTER — Ambulatory Visit: Payer: Medicare Other | Admitting: Family

## 2017-12-09 ENCOUNTER — Telehealth: Payer: Self-pay | Admitting: Family

## 2017-12-09 VITALS — BP 144/82 | HR 73 | Temp 98.0°F | Resp 14 | Wt 146.4 lb

## 2017-12-09 DIAGNOSIS — I10 Essential (primary) hypertension: Secondary | ICD-10-CM

## 2017-12-09 MED ORDER — TRIAMCINOLONE ACETONIDE 0.1 % EX OINT: TOPICAL_OINTMENT | CUTANEOUS | 1 refills | Status: DC

## 2017-12-09 MED ORDER — AMLODIPINE BESYLATE 2.5 MG PO TABS: 2.5000 mg | ORAL_TABLET | Freq: Every day | ORAL | 3 refills | Status: DC

## 2017-12-09 NOTE — Telephone Encounter (Unsigned)
Copied from [place]. Topic: Quick Communication - See Telephone Encounter >> Dec 09, 2017  1:07 PM Hewitt Shorts wrote: CRM for notification. See Telephone encounter for:  Pt  is needing a refill on the kenalog .01% ointment generic brand [facility] [place] [phone] 12/09/17.

## 2017-12-09 NOTE — Telephone Encounter (Signed)
For skin fissures  That patient gets in creases of her body. Patient stated she has used this for years for this reason Medication filled .

## 2017-12-09 NOTE — Telephone Encounter (Signed)
Fine to refill with 2 refills  Would you call and let her know. I would also like to notate in chart why she uses it- would you clarify for me?  Thanks!

## 2017-12-09 NOTE — Progress Notes (Signed)
Subjective:    Patient ID: [name], female    DOB: 06/25/1941, 77 y.o.   MRN: [id number]  CC: Mary Pacheco is a 77 y.o. female who presents today for follow up.   HPI: HTN- compliant with medication. At home varies- 185/80, 115/57.  Notes being in more pain today ( hasnt had celebrex in 2 weeks) , wearing brace for compression fracture. Denies exertional chest pain or pressure, numbness or tingling radiating to left arm or jaw, palpitations, dizziness, frequent headaches, changes in vision, or shortness of breath.  Tyelonol arthritis and brace helps some.  Notes some stress at home Continues to follow with Dr Rockey Situ     Has been seen by Loree Fee, NP at Dr Sharlet [place] office 11/26/17 after fall in December. Concerned for compression fracture. On celebrex. Unable to see lumbar Xray- told had fracture.  Pain has improved with heat.   Dr Marius Ditch- seen 11/11/17. Bloating improved. She is on protonix with no symptoms. No plan for EGD at this time   HISTORY:  Past Medical History:  Diagnosis Date  . Anxiety 2011   patient with anxiety will continue Alprazolam prn  . Arthritis   . GERD (gastroesophageal reflux disease)    pt. with esophagea reflux. She reports great improvement with Dexilant.  [name] Hearing loss    s/p hearing aids  . 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 Hyperlipidemia    patient with HL on statins. lipids well controlled  . Hypertension    Past Surgical History:  Procedure Laterality Date  . ABDOMINAL HYSTERECTOMY  1993   multiple fibroid cysts  . cataract surgery  02/2011   bilateral  . TONSILLECTOMY     Family History  Problem Relation Age of Onset  . Heart failure Mother   . Heart disease Mother        CABG  . Hypertension Mother   . Hyperlipidemia Mother   . Stroke Mother        mini strokes, multiple  . Heart attack Father   . Heart failure Father   . Hyperlipidemia Father   . Hypertension Father   . Heart attack Sister 20       MI    Allergies: Molds &  smuts; Rosuvastatin; and Simvastatin Current Outpatient Medications on File Prior to Visit  Medication Sig Dispense Refill  . acyclovir (ZOVIRAX) 400 MG tablet Take 400 mg by mouth daily.  0  . ALPRAZolam (XANAX) 0.25 MG tablet TAKE 1 TABLET BY MOUTH 3 TIMES A DAY AS NEEDED FOR ANXIETY 90 tablet 0  . aspirin 81 MG tablet Take 81 mg by mouth daily.      [name] atorvastatin (LIPITOR) 20 MG tablet Take 1 tablet (20 mg total) by mouth daily. 90 tablet 3  . celecoxib (CELEBREX) 200 MG capsule TAKE 1 CAPSULE (200 MG TOTAL) BY MOUTH 2 (TWO) TIMES DAILY. 60 capsule 2  . cholecalciferol (VITAMIN D) 1000 UNITS tablet Take 1,000 Units by mouth daily.      . Coenzyme Q10 (EQL COQ10) 300 MG CAPS Take by mouth.    . Cranberry 400 MG CAPS Take by mouth.    . CRANBERRY PO Take by mouth.    . Doxepin HCl 5 % CREA APPLY TO ARMS TWICE A DAY AS NEEDED FOR ITCHING  1  . Flaxseed, Linseed, (FLAX SEED OIL PO) Take 1,400 mg by mouth daily.    Nyoka Cowden Tea 150 MG CAPS Take by mouth.    . hydrochlorothiazide (  MICROZIDE) 12.5 MG capsule TAKE 1 CAPSULE DAILY (MUST ESTABLISH WITH NEW PRIMARY CARE PHYSICIAN) 90 capsule 2  . levothyroxine (SYNTHROID, LEVOTHROID) 50 MCG tablet TAKE 1 TABLET DAILY 90 tablet 2  . Misc Natural Products (TOTAL CARDIO HEALTH FORMULA PO) Take 1 tablet by mouth daily.      . Omega-3 Fatty Acids (FISH OIL TRIPLE STRENGTH) 1400 MG CAPS Take by mouth.    . pantoprazole (PROTONIX) 40 MG tablet TAKE 1 TABLET (40 MG TOTAL) BY MOUTH DAILY.    . Probiotic Product (PROBIOTIC DAILY PO) Take by mouth.    [name] SINGULAIR 10 MG tablet Take 1 tablet (10 mg total) by mouth at bedtime. 90 tablet 3  . triamcinolone (KENALOG) 0.025 % cream APPLY TO LEGS TWICE A DAY AS NEEDED FOR ITCHING  1   No current facility-administered medications on file prior to visit.     Social History   Tobacco Use  . Smoking status: Never Smoker  . Smokeless tobacco: Never Used  Substance Use Topics  . Alcohol use: Yes    Comment:  socially  . Drug use: No    Review of Systems  Constitutional: Negative for chills and fever.  Respiratory: Negative for cough.   Cardiovascular: Negative for chest pain and palpitations.  Gastrointestinal: Negative for nausea and vomiting.  Neurological: Negative for headaches.      Objective:    BP (!) 144/82   Pulse 73   Temp 98 F (36.7 C) (Oral)   Resp 14   Wt 146 lb 6 oz (66.4 kg)   SpO2 98%   BMI 25.93 kg/m  BP Readings from Last 3 Encounters:  12/10/17 (!) 144/82  11/25/17 136/68  11/11/17 (!) 159/65   Wt Readings from Last 3 Encounters:  12/09/17 146 lb 6 oz (66.4 kg)  11/25/17 146 lb 12.8 oz (66.6 kg)  11/11/17 146 lb 6.4 oz (66.4 kg)    Physical Exam  Constitutional: She appears well-developed and well-nourished.  HENT:  Mouth/Throat: Uvula is midline, oropharynx is clear and moist and mucous membranes are normal.  Eyes: Conjunctivae and EOM are normal. Pupils are equal, round, and reactive to light.  Fundus normal bilaterally.   Cardiovascular: Normal rate, regular rhythm, normal heart sounds and normal pulses.  Pulmonary/Chest: Effort normal and breath sounds normal. She has no wheezes. She has no rhonchi. She has no rales.  Neurological: She is alert. She has normal strength. No cranial nerve deficit or sensory deficit. She displays a negative Romberg sign.  Reflex Scores:      Bicep reflexes are 2+ on the right side and 2+ on the left side.      Patellar reflexes are 2+ on the right side and 2+ on the left side. Grip equal and strong bilateral upper extremities. Gait strong and steady. Able to perform rapid alternating movement without difficulty.   Skin: Skin is warm and dry.  Psychiatric: She has a normal mood and affect. Her speech is normal and behavior is normal. Thought content normal.  Vitals reviewed.      Assessment & Plan:   Problem List Items Addressed This Visit      Cardiovascular and Mediastinum   HTN (hypertension) - Primary     Elevated. Reassured by normal neurologic exam.  Improved after she took back brace off. Calibrated wrist BP cuff and home cuff is reading didn't appear accurate. ? Back brace or pain contributory.  Advised to try arm BP cuff and to bring to next visit. Start  low dose amlodipine, BP log at home.        Relevant Medications   amLODipine (NORVASC) 2.5 MG tablet       I am having [name]. Chiong start on amLODipine. I am also having her maintain her Misc Natural Products ([place]), aspirin, cholecalciferol, (Flaxseed, Linseed, (FLAX SEED OIL PO)), FISH OIL TRIPLE STRENGTH, Coenzyme Q10, CRANBERRY PO, Probiotic Product (PROBIOTIC DAILY PO), ALPRAZolam, atorvastatin, SINGULAIR, hydrochlorothiazide, Doxepin HCl, triamcinolone, levothyroxine, Cranberry, Green Tea, pantoprazole, celecoxib, and acyclovir.   Meds ordered this encounter  Medications  . amLODipine (NORVASC) 2.5 MG tablet    Sig: Take 1 tablet (2.5 mg total) by mouth daily.    Dispense:  90 tablet    Refill:  3    Order Specific Question:   Supervising Provider    Answer:   Crecencio Mc [[id number]]    Return precautions given.   Risks, benefits, and alternatives of the medications and treatment plan prescribed today were discussed, and patient expressed understanding.   Education regarding symptom management and diagnosis given to patient on AVS.  Continue to follow with Burnard Hawthorne, FNP for routine health maintenance.   [name] and I agreed with plan.   Mable Paris, FNP

## 2017-12-09 NOTE — Patient Instructions (Addendum)
Monitor blood pressure today. If doesn't improve PLEASE call us ASAP.   Start amlodipine  Monitor blood pressure,  Goal is less than 130/80; if persistently higher, please make sooner follow up appointment so we can recheck you blood pressure and manage medications  Follow up 2 weeks   Managing Your Hypertension Hypertension is commonly called high blood pressure. This is when the force of your blood pressing against the walls of your arteries is too strong. Arteries are blood vessels that carry blood from your heart throughout your body. Hypertension forces the heart to work harder to pump blood, and may cause the arteries to become narrow or stiff. Having untreated or uncontrolled hypertension can cause heart attack, stroke, kidney disease, and other problems. What are blood pressure readings? A blood pressure reading consists of a higher number over a lower number. Ideally, your blood pressure should be below 120/80. The first ("top") number is called the systolic pressure. It is a measure of the pressure in your arteries as your heart beats. The second ("bottom") number is called the diastolic pressure. It is a measure of the pressure in your arteries as the heart relaxes. What does my blood pressure reading mean? Blood pressure is classified into four stages. Based on your blood pressure reading, your health care provider may use the following stages to determine what type of treatment you need, if any. Systolic pressure and diastolic pressure are measured in a unit called mm Hg. Normal  Systolic pressure: below 956.  Diastolic pressure: below 80. Elevated  Systolic pressure: 213-086.  Diastolic pressure: below 80. Hypertension stage 1  Systolic pressure: 578-469.  Diastolic pressure: 62-95. Hypertension stage 2  Systolic pressure: 284 or above.  Diastolic pressure: 90 or above. What health risks are associated with hypertension? Managing your hypertension is an important  responsibility. Uncontrolled hypertension can lead to:  A heart attack.  A stroke.  A weakened blood vessel (aneurysm).  Heart failure.  Kidney damage.  Eye damage.  Metabolic syndrome.  Memory and concentration problems.  What changes can I make to manage my hypertension? Hypertension can be managed by making lifestyle changes and possibly by taking medicines. Your health care provider will help you make a plan to bring your blood pressure within a normal range. Eating and drinking  Eat a diet that is high in fiber and potassium, and low in salt (sodium), added sugar, and fat. An example eating plan is called the DASH (Dietary Approaches to Stop Hypertension) diet. To eat this way: ? Eat plenty of fresh fruits and vegetables. Try to fill half of your plate at each meal with fruits and vegetables. ? Eat whole grains, such as whole wheat pasta, brown rice, or whole grain bread. Fill about one quarter of your plate with whole grains. ? Eat low-fat diary products. ? Avoid fatty cuts of meat, processed or cured meats, and poultry with skin. Fill about one quarter of your plate with lean proteins such as fish, chicken without skin, beans, eggs, and tofu. ? Avoid premade and processed foods. These tend to be higher in sodium, added sugar, and fat.  Reduce your daily sodium intake. Most people with hypertension should eat less than 1,500 mg of sodium a day.  Limit alcohol intake to no more than 1 drink a day for nonpregnant women and 2 drinks a day for men. One drink equals 12 oz of beer, 5 oz of wine, or 1 oz of hard liquor. Lifestyle  Work with your health care  provider to maintain a healthy body weight, or to lose weight. Ask what an ideal weight is for you.  Get at least 30 minutes of exercise that causes your heart to beat faster (aerobic exercise) most days of the week. Activities may include walking, swimming, or biking.  Include exercise to strengthen your muscles (resistance  exercise), such as weight lifting, as part of your weekly exercise routine. Try to do these types of exercises for 30 minutes at least 3 days a week.  Do not use any products that contain nicotine or tobacco, such as cigarettes and e-cigarettes. If you need help quitting, ask your health care provider.  Control any long-term (chronic) conditions you have, such as high cholesterol or diabetes. Monitoring  Monitor your blood pressure at home as told by your health care provider. Your personal target blood pressure may vary depending on your medical conditions, your age, and other factors.  Have your blood pressure checked regularly, as often as told by your health care provider. Working with your health care provider  Review all the medicines you take with your health care provider because there may be side effects or interactions.  Talk with your health care provider about your diet, exercise habits, and other lifestyle factors that may be contributing to hypertension.  Visit your health care provider regularly. Your health care provider can help you create and adjust your plan for managing hypertension. Will I need medicine to control my blood pressure? Your health care provider may prescribe medicine if lifestyle changes are not enough to get your blood pressure under control, and if:  Your systolic blood pressure is 130 or higher.  Your diastolic blood pressure is 80 or higher.  Take medicines only as told by your health care provider. Follow the directions carefully. Blood pressure medicines must be taken as prescribed. The medicine does not work as well when you skip doses. Skipping doses also puts you at risk for problems. Contact a health care provider if:  You think you are having a reaction to medicines you have taken.  You have repeated (recurrent) headaches.  You feel dizzy.  You have swelling in your ankles.  You have trouble with your vision. Get help right away  if:  You develop a severe headache or confusion.  You have unusual weakness or numbness, or you feel faint.  You have severe pain in your chest or abdomen.  You vomit repeatedly.  You have trouble breathing. Summary  Hypertension is when the force of blood pumping through your arteries is too strong. If this condition is not controlled, it may put you at risk for serious complications.  Your personal target blood pressure may vary depending on your medical conditions, your age, and other factors. For most people, a normal blood pressure is less than 120/80.  Hypertension is managed by lifestyle changes, medicines, or both. Lifestyle changes include weight loss, eating a healthy, low-sodium diet, exercising more, and limiting alcohol. This information is not intended to replace advice given to you by your health care provider. Make sure you discuss any questions you have with your health care provider. Document Released: 07/02/2012 Document Revised: 09/05/2016 Document Reviewed: 09/05/2016 Elsevier Interactive Patient Education  [employer].

## 2017-12-09 NOTE — Telephone Encounter (Signed)
Pt  Requesting a  Refill of  Kenalog 0.1 ointment  Last filled  10/06/2014   Last OV   12/09/2017   -  Today  Pharmacy [facility]   [place]

## 2017-12-09 NOTE — Assessment & Plan Note (Addendum)
Elevated. Reassured by normal neurologic exam.  Improved after she took back brace off. Calibrated wrist BP cuff and home cuff is reading didn't appear accurate. ? Back brace or pain contributory.  Advised to try arm BP cuff and to bring to next visit. Start low dose amlodipine, BP log at home.

## 2017-12-23 ENCOUNTER — Other Ambulatory Visit: Payer: Self-pay | Admitting: Family

## 2017-12-23 NOTE — Telephone Encounter (Signed)
Last office visit 12/09/17 Next office visit 12/27/17

## 2017-12-25 NOTE — Telephone Encounter (Signed)
Pt states the pharmacy advised pt she follow up with her dr about this Rx. acyclovir (ZOVIRAX) 400 MG tablet  [id number] - [place], [place] [phone] (Phone) [phone] (F

## 2017-12-27 ENCOUNTER — Encounter: Payer: Self-pay | Admitting: Family

## 2017-12-27 ENCOUNTER — Ambulatory Visit: Payer: Medicare Other | Admitting: Family

## 2017-12-27 ENCOUNTER — Telehealth: Payer: Self-pay | Admitting: Family

## 2017-12-27 DIAGNOSIS — F419 Anxiety disorder, unspecified: Secondary | ICD-10-CM | POA: Diagnosis not present

## 2017-12-27 DIAGNOSIS — I10 Essential (primary) hypertension: Secondary | ICD-10-CM

## 2017-12-27 MED ORDER — ALPRAZOLAM 0.25 MG PO TABS: ORAL_TABLET | ORAL | 0 refills | Status: DC

## 2017-12-27 MED ORDER — AMLODIPINE BESYLATE 5 MG PO TABS: 5.0000 mg | ORAL_TABLET | Freq: Every day | ORAL | 1 refills | Status: DC

## 2017-12-27 NOTE — Telephone Encounter (Signed)
Call pt  Happy to refill zovirax however please clarify how patient uses.   Does she take for prevention every day?  Or only when has breakout of genital herpes?

## 2017-12-27 NOTE — Assessment & Plan Note (Addendum)
Overall looks improved based on numbers at home. Will increase norvasc to 5mg  as she is not quite at goal. Will need to keep an eye on DBP as on the low end.  She will keep log and let me know numbers.

## 2017-12-27 NOTE — Addendum Note (Signed)
Addended by: Burnard Hawthorne on: 12/27/2017 03:50 PM   Modules accepted: Orders

## 2017-12-27 NOTE — Patient Instructions (Signed)
Increase amlodipine to 5mg    Keep log and let me know if < 130/80 which is goal.

## 2017-12-27 NOTE — Assessment & Plan Note (Signed)
Uses xanax very very sparingly. Refilled today

## 2017-12-27 NOTE — Progress Notes (Addendum)
Subjective:    Patient ID: Mary Pacheco, female    DOB: 31-Jul-1941, 77 y.o.   MRN: [id number]  CC: Mary Pacheco is a 77 y.o. female who presents today for follow up.   HPI: HTN- fluctuating at home - recent values 118/60, 138/55, 152/56. Denies exertional chest pain or pressure, numbness or tingling radiating to left arm or jaw, palpitations, dizziness, frequent headaches, changes in vision, or shortness of breath.   Would like refill of xanax. Uses very occasionally. Same bottle from prior pcp for past 2 years.     HISTORY:  Past Medical History:  Diagnosis Date  . Anxiety 2011   patient with anxiety will continue Alprazolam prn  . Arthritis   . GERD (gastroesophageal reflux disease)    pt. with esophagea reflux. She reports great improvement with Dexilant.  [name] Hearing loss    s/p hearing aids  . 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 Hyperlipidemia    patient with HL on statins. lipids well controlled  . Hypertension    Past Surgical History:  Procedure Laterality Date  . ABDOMINAL HYSTERECTOMY  1993   multiple fibroid cysts  . cataract surgery  02/2011   bilateral  . TONSILLECTOMY     Family History  Problem Relation Age of Onset  . Heart failure Mother   . Heart disease Mother        CABG  . Hypertension Mother   . Hyperlipidemia Mother   . Stroke Mother        mini strokes, multiple  . Heart attack Father   . Heart failure Father   . Hyperlipidemia Father   . Hypertension Father   . Heart attack Sister 2       MI    Allergies: Molds & smuts; Rosuvastatin; and Simvastatin Current Outpatient Medications on File Prior to Visit  Medication Sig Dispense Refill  . acyclovir (ZOVIRAX) 400 MG tablet Take 400 mg by mouth daily.  0  . acyclovir (ZOVIRAX) 400 MG tablet TAKE 1 TABLET BY MOUTH EVERY DAY 90 tablet 0  . aspirin 81 MG tablet Take 81 mg by mouth daily.      [name] atorvastatin (LIPITOR) 20 MG tablet Take 1 tablet (20 mg total) by mouth daily. 90 tablet 3  .  celecoxib (CELEBREX) 200 MG capsule TAKE 1 CAPSULE (200 MG TOTAL) BY MOUTH 2 (TWO) TIMES DAILY. 60 capsule 2  . cholecalciferol (VITAMIN D) 1000 UNITS tablet Take 1,000 Units by mouth daily.      . Coenzyme Q10 (EQL COQ10) 300 MG CAPS Take by mouth.    . Cranberry 400 MG CAPS Take by mouth.    . CRANBERRY PO Take by mouth.    . Flaxseed, Linseed, (FLAX SEED OIL PO) Take 1,400 mg by mouth daily.    Nyoka Cowden Tea 150 MG CAPS Take by mouth.    . hydrochlorothiazide (MICROZIDE) 12.5 MG capsule TAKE 1 CAPSULE DAILY (MUST ESTABLISH WITH NEW PRIMARY CARE PHYSICIAN) 90 capsule 2  . levothyroxine (SYNTHROID, LEVOTHROID) 50 MCG tablet TAKE 1 TABLET DAILY 90 tablet 2  . Misc Natural Products (TOTAL CARDIO HEALTH FORMULA PO) Take 1 tablet by mouth daily.      . Omega-3 Fatty Acids (FISH OIL TRIPLE STRENGTH) 1400 MG CAPS Take by mouth.    . pantoprazole (PROTONIX) 40 MG tablet TAKE 1 TABLET (40 MG TOTAL) BY MOUTH DAILY.    . Probiotic Product (PROBIOTIC DAILY PO) Take by mouth.    [name] SINGULAIR  10 MG tablet Take 1 tablet (10 mg total) by mouth at bedtime. 90 tablet 3   No current facility-administered medications on file prior to visit.     Social History   Tobacco Use  . Smoking status: Never Smoker  . Smokeless tobacco: Never Used  Substance Use Topics  . Alcohol use: Yes    Comment: socially  . Drug use: No    Review of Systems  Constitutional: Negative for chills and fever.  Respiratory: Negative for cough.   Cardiovascular: Negative for chest pain and palpitations.  Gastrointestinal: Negative for nausea and vomiting.      Objective:    BP (!) 162/78 (BP Location: Left Arm, Patient Position: Sitting, Cuff Size: Normal)   Pulse 78   Temp 98.1 F (36.7 C) (Oral)   Resp 16   Wt 146 lb 6 oz (66.4 kg)   SpO2 98%   BMI 25.93 kg/m  BP Readings from Last 3 Encounters:  12/27/17 (!) 162/78  12/10/17 (!) 144/82  11/25/17 136/68   Wt Readings from Last 3 Encounters:  12/27/17 146 lb 6 oz  (66.4 kg)  12/09/17 146 lb 6 oz (66.4 kg)  11/25/17 146 lb 12.8 oz (66.6 kg)    Physical Exam  Constitutional: She appears well-developed and well-nourished.  Eyes: Conjunctivae are normal.  Cardiovascular: Normal rate, regular rhythm, normal heart sounds and normal pulses.  Pulmonary/Chest: Effort normal and breath sounds normal. She has no wheezes. She has no rhonchi. She has no rales.  Neurological: She is alert.  Skin: Skin is warm and dry.  Psychiatric: She has a normal mood and affect. Her speech is normal and behavior is normal. Thought content normal.  Vitals reviewed.      Assessment & Plan:   Problem List Items Addressed This Visit      Cardiovascular and Mediastinum   HTN (hypertension)    Overall looks improved based on numbers at home. Will increase norvasc to 5mg  as she is not quite at goal. Will need to keep an eye on DBP as on the low end.  She will keep log and let me know numbers.       Relevant Medications   amLODipine (NORVASC) 5 MG tablet     Other   Anxiety    Uses xanax very very sparingly. Refilled today      Relevant Medications   ALPRAZolam (XANAX) 0.25 MG tablet       I have discontinued Charlett Nose T. Ripple's Doxepin HCl, triamcinolone, and triamcinolone ointment. I have also changed her amLODipine and ALPRAZolam. Additionally, I am having her maintain her Misc Natural Products ([place]), aspirin, cholecalciferol, (Flaxseed, Linseed, (FLAX SEED OIL PO)), FISH OIL TRIPLE STRENGTH, Coenzyme Q10, CRANBERRY PO, Probiotic Product (PROBIOTIC DAILY PO), atorvastatin, SINGULAIR, hydrochlorothiazide, levothyroxine, Cranberry, Green Tea, pantoprazole, celecoxib, acyclovir, and acyclovir.   Meds ordered this encounter  Medications  . amLODipine (NORVASC) 5 MG tablet    Sig: Take 1 tablet (5 mg total) by mouth daily.    Dispense:  90 tablet    Refill:  1    Order Specific Question:   Supervising Provider    Answer:   Deborra Medina L  [[id number]]  . ALPRAZolam (XANAX) 0.25 MG tablet    Sig: TAKE 1 TABLET BY MOUTH 2 TIMES A DAY AS NEEDED FOR ANXIETY    Dispense:  30 tablet    Refill:  0    This request is for a new prescription for a controlled  substance as required by Federal/State law.    Order Specific Question:   Supervising Provider    Answer:   Crecencio Mc [[id number]]    Return precautions given.   Risks, benefits, and alternatives of the medications and treatment plan prescribed today were discussed, and patient expressed understanding.   Education regarding symptom management and diagnosis given to patient on AVS.  Continue to follow with Burnard Hawthorne, FNP for routine health maintenance.   [name] and I agreed with plan.   Mable Paris, FNP

## 2017-12-31 NOTE — Telephone Encounter (Signed)
Script filled 12/25/17

## 2018-01-07 ENCOUNTER — Telehealth: Payer: Self-pay

## 2018-01-07 NOTE — Telephone Encounter (Signed)
Spoke with patient she is willing to try generic Singulair, [facility] see if she is able to tolerate.

## 2018-01-07 NOTE — Telephone Encounter (Signed)
Copied from [place]. Topic: General - Other >> Jan 07, 2018 12:39 PM Marin Olp L wrote: Reason for CRM: Patient would like a call from NP Arnett to discuss Singulair no longer being on formulary and wanting to increase her dosage?

## 2018-01-08 MED ORDER — MONTELUKAST SODIUM 10 MG PO TABS: 10.0000 mg | ORAL_TABLET | Freq: Every day | ORAL | 0 refills | Status: DC

## 2018-01-08 NOTE — Telephone Encounter (Signed)
Left message to return call regarding below, script sent in for Singulair

## 2018-01-08 NOTE — Telephone Encounter (Signed)
Please send in generic and advise pt that 10mg  is the highest dosage. Would she like a consult with allergies? Advise She may also make a f/u with me as we havent discussed her symptoms, or current regimen

## 2018-01-08 NOTE — Addendum Note (Signed)
Addended by: Johna Sheriff on: 01/08/2018 11:14 AM   Modules accepted: Orders

## 2018-01-09 ENCOUNTER — Other Ambulatory Visit: Payer: Self-pay | Admitting: Family

## 2018-01-16 NOTE — Telephone Encounter (Signed)
Left voice mail to call back ok for P[facility] to speak to patient 

## 2018-01-16 NOTE — Telephone Encounter (Signed)
Mychart message sent.

## 2018-01-17 NOTE — Telephone Encounter (Signed)
noted 

## 2018-01-17 NOTE — Telephone Encounter (Signed)
Pt states no she doesn't see a need in going to a specialist. The generic of the singular is working as well as the singular at this time for her. She has been taking it the past 3 - 4 days

## 2018-02-01 ENCOUNTER — Other Ambulatory Visit: Payer: Self-pay | Admitting: Family

## 2018-02-10 ENCOUNTER — Other Ambulatory Visit: Payer: Self-pay

## 2018-02-10 ENCOUNTER — Telehealth: Payer: Self-pay | Admitting: Family

## 2018-02-10 MED ORDER — MONTELUKAST SODIUM 10 MG PO TABS: 10.0000 mg | ORAL_TABLET | Freq: Every day | ORAL | 0 refills | Status: DC

## 2018-02-10 NOTE — Telephone Encounter (Signed)
Copied from [place] [id number]. Topic: Quick Communication - Rx Refill/Question >> Feb 10, 2018 11:30 AM Aurelio Brash B wrote: Medication:montelukast (SINGULAIR) 10 MG tablet  generic form    Has the patient contacted their pharmacy? no   (Agent: If no, request that the patient contact the pharmacy for the refill.)[place], [place] [phone] (Phone) [phone] (Fax)      Preferred Pharmacy (with phone number or street name):    Agent: Please be advised that RX refills may take up to 3 business days. We ask that you follow-up with your pharmacy.

## 2018-03-19 ENCOUNTER — Other Ambulatory Visit: Payer: Self-pay | Admitting: Family

## 2018-03-20 ENCOUNTER — Other Ambulatory Visit: Payer: Self-pay | Admitting: Family

## 2018-04-08 ENCOUNTER — Ambulatory Visit: Payer: Medicare Other | Admitting: Family Medicine

## 2018-04-08 ENCOUNTER — Encounter: Payer: Self-pay | Admitting: Family Medicine

## 2018-04-08 VITALS — BP 164/70 | HR 80 | Temp 98.1°F | Wt 142.1 lb

## 2018-04-08 DIAGNOSIS — N9089 Other specified noninflammatory disorders of vulva and perineum: Secondary | ICD-10-CM | POA: Diagnosis not present

## 2018-04-08 NOTE — Progress Notes (Signed)
Subjective:    Patient ID: Mary Pacheco, female    DOB: September 10, 1941, 77 y.o.   MRN: [id number]  HPI  Mary Pacheco is a 77 year old female who presents with a sore that is in her vaginal or labia area 2 days ago. She is unsure how long it has been present and states that this was an incidental finding while bathing. She describes this area as red and pea size.  She denies fever, chills, sweats, pain, vaginal discharge, bleeding, or pruritis.  She takes acyclovir daily for suppression for herpes. She denies any recent outbreak or lesions. She denies any new sexual partners and denies pain with intercourse. Treatment with epsom salt in her bath has not provided benefit. She states that she believes area has decreased in size since she has been checking it for the past 2 days.  History of abdominal hysterectomy in 1993 for multiple fibroid cysts. She has never been pregnant and reports going through fertility testing many years ago.    Review of Systems  Constitutional: Negative for chills, fatigue and fever.  Respiratory: Negative for cough, shortness of breath and wheezing.   Cardiovascular: Negative for chest pain and palpitations.  Gastrointestinal: Negative for abdominal pain, diarrhea, nausea and vomiting.  Genitourinary: Negative for dysuria, flank pain, frequency, hematuria and urgency.       Red spot in vaginal/labia area  Musculoskeletal: Negative for myalgias.  Skin: Negative for rash.   Past Medical History:  Diagnosis Date  . Anxiety 2011   patient with anxiety will continue Alprazolam prn  . Arthritis   . GERD (gastroesophageal reflux disease)    pt. with esophagea reflux. She reports great improvement with Dexilant.  [name] Hearing loss    s/p hearing aids  . 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 Hyperlipidemia    patient with HL on statins. lipids well controlled  . Hypertension      Social History   Socioeconomic History  . Marital status: Married    Spouse name: Not on file    . Number of children: Not on file  . Years of education: Not on file  . Highest education level: Not on file  Occupational History  . Not on file  Social Needs  . Financial resource strain: Not on file  . Food insecurity:    Worry: Not on file    Inability: Not on file  . Transportation needs:    Medical: No    Non-medical: No  Tobacco Use  . Smoking status: Never Smoker  . Smokeless tobacco: Never Used  Substance and Sexual Activity  . Alcohol use: Yes    Comment: socially  . Drug use: No  . Sexual activity: Yes  Lifestyle  . Physical activity:    Days per week: Not on file    Minutes per session: Not on file  . Stress: Not on file  Relationships  . Social connections:    Talks on phone: Not on file    Gets together: Not on file    Attends religious service: Not on file    Active member of club or organization: Not on file    Attends meetings of clubs or organizations: Not on file    Relationship status: Not on file  . Intimate partner violence:    Fear of current or ex partner: No    Emotionally abused: No    Physically abused: No    Forced sexual activity: No  Other Topics Concern  .  Not on file  Social History Narrative   From [employer]      Retired Education officer, museum      married    Past Surgical History:  Procedure Laterality Date  . ABDOMINAL HYSTERECTOMY  1993   multiple fibroid cysts  . cataract surgery  02/2011   bilateral  . TONSILLECTOMY      Family History  Problem Relation Age of Onset  . Heart failure Mother   . Heart disease Mother        CABG  . Hypertension Mother   . Hyperlipidemia Mother   . Stroke Mother        mini strokes, multiple  . Heart attack Father   . Heart failure Father   . Hyperlipidemia Father   . Hypertension Father   . Heart attack Sister 63       MI    Allergies  Allergen Reactions  . Molds & Smuts   . Rosuvastatin Hives  . Simvastatin     myalgia    Current Outpatient Medications on File  Prior to Visit  Medication Sig Dispense Refill  . acyclovir (ZOVIRAX) 400 MG tablet TAKE 1 TABLET BY MOUTH EVERY DAY 90 tablet 0  . ALPRAZolam (XANAX) 0.25 MG tablet TAKE 1 TABLET BY MOUTH 2 TIMES A DAY AS NEEDED FOR ANXIETY 30 tablet 0  . amLODipine (NORVASC) 5 MG tablet Take 1 tablet (5 mg total) by mouth daily. 90 tablet 1  . aspirin 81 MG tablet Take 81 mg by mouth daily.      [name] atorvastatin (LIPITOR) 20 MG tablet TAKE 1 TABLET BY MOUTH EVERY DAY 90 tablet 1  . celecoxib (CELEBREX) 200 MG capsule TAKE 1 CAPSULE (200 MG TOTAL) BY MOUTH 2 (TWO) TIMES DAILY. 60 capsule 2  . cholecalciferol (VITAMIN D) 1000 UNITS tablet Take 1,000 Units by mouth daily.      . Coenzyme Q10 (EQL COQ10) 300 MG CAPS Take by mouth.    . Cranberry 400 MG CAPS Take by mouth.    . Flaxseed, Linseed, (FLAX SEED OIL PO) Take 1,400 mg by mouth daily.    Nyoka Cowden Tea 150 MG CAPS Take by mouth.    . hydrochlorothiazide (MICROZIDE) 12.5 MG capsule TAKE 1 CAPSULE DAILY (MUST ESTABLISH WITH NEW PRIMARY CARE PHYSICIAN) 90 capsule 1  . levothyroxine (SYNTHROID, LEVOTHROID) 50 MCG tablet TAKE 1 TABLET DAILY 90 tablet 2  . Misc Natural Products (TOTAL CARDIO HEALTH FORMULA PO) Take 1 tablet by mouth daily.      . montelukast (SINGULAIR) 10 MG tablet Take 1 tablet (10 mg total) by mouth at bedtime. 90 tablet 0  . Omega-3 Fatty Acids (FISH OIL TRIPLE STRENGTH) 1400 MG CAPS Take by mouth.    . pantoprazole (PROTONIX) 40 MG tablet TAKE 1 TABLET (40 MG TOTAL) BY MOUTH DAILY.    . Probiotic Product (PROBIOTIC DAILY PO) Take by mouth.     No current facility-administered medications on file prior to visit.     BP (!) 164/70 (BP Location: Left Arm, Patient Position: Sitting, Cuff Size: Normal)   Pulse 80   Temp 98.1 F (36.7 C) (Oral)   Wt 142 lb 2 oz (64.5 kg)   SpO2 99%   BMI 25.18 kg/m       Objective:   Physical Exam  Constitutional: She is oriented to person, place, and time. She appears well-developed and  well-nourished.  Eyes: Pupils are equal, round, and reactive to light. No scleral icterus.  Neck:  Neck supple.  Cardiovascular: Normal rate and normal heart sounds.  Pulmonary/Chest: Effort normal and breath sounds normal. She has no wheezes. She has no rales.  Abdominal: Soft. Bowel sounds are normal. There is no tenderness.  Genitourinary:  Genitourinary Comments: Erythematous macule approximately 85mm x 21mm on inner aspect of labia minora. Area is not raised and is not painful to touch. External genitalia normal otherwise.   Lymphadenopathy:    She has no cervical adenopathy.  Neurological: She is alert and oriented to person, place, and time.         Assessment & Plan:  1. Labia irritation Exam is reassuring; it is unclear how long this has been present however she reports that she believes area has improved over the past 2 days. We discussed this can be irritation from vaginal dryness that occurs after menopause and can aggravated by intercourse. She will monitor the area for changes for one to two weeks, trial use of Replens vaginal moisturizer, and follow up if symptom does not improve, worsen, or new symptoms develop. We also discussed that a referral to gynecology can be placed if interested for further evaluation. She would like to monitor for symptoms as described and will let us know if no improvement. She may consider gynecology referral at that time. Return precautions provided.   Delano Metz, FNP-C

## 2018-04-08 NOTE — Patient Instructions (Addendum)
Please monitor symptoms for changes. If no improvement or area gets larger, follow up for further evaluation.  You can try over the counter vaginal lubricants for symptom relief. Replens is one option that can be used.   Atrophic Vaginitis Atrophic vaginitis is when the tissues that line the vagina become dry and thin. This is caused by a drop in estrogen. Estrogen helps:  To keep the vagina moist.  To make a clear fluid that helps: ? To lubricate the vagina for sex. ? To protect the vagina from infection.  If the lining of the vagina is dry and thin, it may:  Make sex painful. It may also cause bleeding.  Cause a feeling of: ? Burning. ? Irritation. ? Itchiness.  Make an exam of your vagina painful. It may also cause bleeding.  Make you lose interest in sex.  Cause a burning feeling when you pee.  Make your vaginal fluid (discharge) brown or yellow.  For some women, there are no symptoms. This condition is most common in women who do not get their regular menstrual periods anymore (menopause). This often starts when a woman is 82-57 years old. Follow these instructions at home:  Take medicines only as told by your doctor. Do not use any herbal or alternative medicines unless your doctor says it is okay.  Use over-the-counter products for dryness only as told by your doctor. These include: ? Creams. ? Lubricants. ? Moisturizers.  Do not douche.  Do not use products that can make your vagina dry. These include: ? Scented feminine sprays. ? Scented tampons. ? Scented soaps.  If it hurts to have sex, tell your sexual partner. Contact a doctor if:  Your discharge looks different than normal.  Your vagina has an unusual smell.  You have new symptoms.  Your symptoms do not get better with treatment.  Your symptoms get worse. This information is not intended to replace advice given to you by your health care provider. Make sure you discuss any questions you have  with your health care provider. Document Released: 03/26/2008 Document Revised: 03/15/2016 Document Reviewed: 09/29/2014 Elsevier Interactive Patient Education  [employer].

## 2018-04-24 ENCOUNTER — Other Ambulatory Visit: Payer: Self-pay | Admitting: Family

## 2018-04-28 ENCOUNTER — Other Ambulatory Visit: Payer: Self-pay | Admitting: Family

## 2018-05-06 ENCOUNTER — Telehealth: Payer: Self-pay

## 2018-05-06 DIAGNOSIS — N9089 Other specified noninflammatory disorders of vulva and perineum: Secondary | ICD-10-CM

## 2018-05-06 NOTE — Telephone Encounter (Signed)
Copied from [place] [id number]. Topic: General - Other >> May 06, 2018  1:46 PM Keene Breath wrote: Reason for CRM: Patient called to leave a message for PA Gay Filler regarding her last appoint.  PA wanted patient to let her know if there was any changes in the spot on the vaginal area.  Patient called to say the spot was smaller but is still red.  Please advise.  CB# [phone].

## 2018-05-06 NOTE — Telephone Encounter (Addendum)
Left message to return call, need more info, symptoms, ? Does she want to see gyn

## 2018-05-07 NOTE — Telephone Encounter (Signed)
She does not have anymore symptoms but wants to see the gyn. Still has the spot and still red. She does not have a preference for the gyn. Please only call her back at [phone]

## 2018-05-07 NOTE — Telephone Encounter (Signed)
Referral to gynecology provided.

## 2018-05-07 NOTE — Telephone Encounter (Signed)
Patient advised of below and verbalized understanding.  

## 2018-05-12 ENCOUNTER — Ambulatory Visit: Payer: Medicare Other | Admitting: Obstetrics and Gynecology

## 2018-05-14 ENCOUNTER — Other Ambulatory Visit (HOSPITAL_COMMUNITY)
Admission: RE | Admit: 2018-05-14 | Discharge: 2018-05-14 | Disposition: A | Discharge status: Home / Self Care | Payer: Medicare Other | Source: Ambulatory Visit | Attending: Obstetrics and Gynecology | Admitting: Obstetrics and Gynecology

## 2018-05-14 ENCOUNTER — Encounter: Payer: Self-pay | Admitting: Obstetrics and Gynecology

## 2018-05-14 ENCOUNTER — Ambulatory Visit: Payer: Medicare Other | Admitting: Obstetrics and Gynecology

## 2018-05-14 VITALS — BP 140/82 | HR 86 | Ht 64.0 in | Wt 142.5 lb

## 2018-05-14 DIAGNOSIS — N898 Other specified noninflammatory disorders of vagina: Secondary | ICD-10-CM | POA: Insufficient documentation

## 2018-05-14 DIAGNOSIS — Z1151 Encounter for screening for human papillomavirus (HPV): Secondary | ICD-10-CM

## 2018-05-14 DIAGNOSIS — Z124 Encounter for screening for malignant neoplasm of cervix: Secondary | ICD-10-CM | POA: Insufficient documentation

## 2018-05-14 NOTE — Progress Notes (Signed)
Burnard Hawthorne, FNP   Chief Complaint  Patient presents with  . Refferal    Red spot on left side noticed x3 or 4 weeks ago.     HPI:      Mary Pacheco is a 77 y.o. No obstetric history on file. who LMP was No LMP recorded. Patient has had a hysterectomy., presents today for NP eval of vaginal lesion, referred by PCP Delano Metz, FNP). Pt noted a red, painless spot on LT labia minora about 5 wks ago. Area hasn't changed since then and pt and her PCP wanted GYN eval.  Pt denies any vag itch, irritation, d/c, bleeding.   She is s/p hyst for leio; never had any abn paps; no recent paps due to hyst.  She is not sexually active with intercourse due to vaginal dryness/dyspareunia, but no dryness otherwise. Pt with hx of genital HSV and takes acyclovir daily as preventive.   Past Medical History:  Diagnosis Date  . Anxiety 2011   patient with anxiety will continue Alprazolam prn  . Arthritis   . GERD (gastroesophageal reflux disease)    pt. with esophagea reflux. She reports great improvement with Dexilant.  [name] Hearing loss    s/p hearing aids  . 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 Hyperlipidemia    patient with HL on statins. lipids well controlled  . Hypertension     Past Surgical History:  Procedure Laterality Date  . ABDOMINAL HYSTERECTOMY  1993   multiple fibroid cysts  . cataract surgery  02/2011   bilateral  . TONSILLECTOMY      Family History  Problem Relation Age of Onset  . Heart failure Mother   . Heart disease Mother        CABG  . Hypertension Mother   . Hyperlipidemia Mother   . Stroke Mother        mini strokes, multiple  . Heart attack Father   . Heart failure Father   . Hyperlipidemia Father   . Hypertension Father   . Heart attack Sister 36       MI    Social History   Socioeconomic History  . Marital status: Married    Spouse name: Not on file  . Number of children: Not on file  . Years of education: Not on file  . Highest  education level: Not on file  Occupational History  . Not on file  Social Needs  . Financial resource strain: Not on file  . Food insecurity:    Worry: Not on file    Inability: Not on file  . Transportation needs:    Medical: No    Non-medical: No  Tobacco Use  . Smoking status: Never Smoker  . Smokeless tobacco: Never Used  Substance and Sexual Activity  . Alcohol use: Yes    Comment: socially  . Drug use: No  . Sexual activity: Yes    Birth control/protection: Post-menopausal  Lifestyle  . Physical activity:    Days per week: Not on file    Minutes per session: Not on file  . Stress: Not on file  Relationships  . Social connections:    Talks on phone: Not on file    Gets together: Not on file    Attends religious service: Not on file    Active member of club or organization: Not on file    Attends meetings of clubs or organizations: Not on file    Relationship status:  Not on file  . Intimate partner violence:    Fear of current or ex partner: No    Emotionally abused: No    Physically abused: No    Forced sexual activity: No  Other Topics Concern  . Not on file  Social History Narrative   From [place]      Retired Education officer, museum      married    Outpatient Medications Prior to Visit  Medication Sig Dispense Refill  . acyclovir (ZOVIRAX) 400 MG tablet TAKE 1 TABLET BY MOUTH EVERY DAY 90 tablet 0  . ALPRAZolam (XANAX) 0.25 MG tablet TAKE 1 TABLET BY MOUTH 2 TIMES A DAY AS NEEDED FOR ANXIETY 30 tablet 0  . amLODipine (NORVASC) 5 MG tablet Take 1 tablet (5 mg total) by mouth daily. 90 tablet 1  . aspirin 81 MG tablet Take 81 mg by mouth daily.      [name] atorvastatin (LIPITOR) 20 MG tablet TAKE 1 TABLET BY MOUTH EVERY DAY 90 tablet 1  . cholecalciferol (VITAMIN D) 1000 UNITS tablet Take 1,000 Units by mouth daily.      . Coenzyme Q10 (EQL COQ10) 300 MG CAPS Take by mouth.    . Flaxseed, Linseed, (FLAX SEED OIL PO) Take 1,400 mg by mouth daily.    Nyoka Cowden Tea 150 MG CAPS Take by mouth.    . hydrochlorothiazide (MICROZIDE) 12.5 MG capsule TAKE 1 CAPSULE DAILY (MUST ESTABLISH WITH NEW PRIMARY CARE PHYSICIAN) 90 capsule 1  . levothyroxine (SYNTHROID, LEVOTHROID) 50 MCG tablet TAKE 1 TABLET DAILY 90 tablet 2  . methocarbamol (ROBAXIN) 500 MG tablet 1/2-1 po qHS prn    . Misc Natural Products (TOTAL CARDIO HEALTH FORMULA PO) Take 1 tablet by mouth daily.      . montelukast (SINGULAIR) 10 MG tablet TAKE 1 TABLET AT BEDTIME 90 tablet 1  . Omega-3 Fatty Acids (FISH OIL TRIPLE STRENGTH) 1400 MG CAPS Take by mouth.    . pantoprazole (PROTONIX) 40 MG tablet TAKE 1 TABLET (40 MG TOTAL) BY MOUTH DAILY.    . Probiotic Product (PROBIOTIC DAILY PO) Take by mouth.    . celecoxib (CELEBREX) 200 MG capsule TAKE 1 CAPSULE (200 MG TOTAL) BY MOUTH 2 (TWO) TIMES DAILY. (Patient not taking: Reported on 05/14/2018) 60 capsule 2  . Cranberry 400 MG CAPS Take by mouth.    . pantoprazole (PROTONIX) 40 MG tablet TAKE 1 TABLET DAILY 90 tablet 1   No facility-administered medications prior to visit.     ROS:  Review of Systems  Constitutional: Negative for fatigue, fever and unexpected weight change.  Respiratory: Negative for cough, shortness of breath and wheezing.   Cardiovascular: Negative for chest pain, palpitations and leg swelling.  Gastrointestinal: Negative for blood in stool, constipation, diarrhea, nausea and vomiting.  Endocrine: Negative for cold intolerance, heat intolerance and polyuria.  Genitourinary: Positive for genital sores. Negative for dyspareunia, dysuria, flank pain, frequency, hematuria, menstrual problem, pelvic pain, urgency, vaginal bleeding, vaginal discharge and vaginal pain.  Musculoskeletal: Negative for back pain, joint swelling and myalgias.  Skin: Negative for rash.  Neurological: Negative for dizziness, syncope, light-headedness, numbness and headaches.  Hematological: Negative for adenopathy.  Psychiatric/Behavioral: Negative  for agitation, confusion, sleep disturbance and suicidal ideas. The patient is not nervous/anxious.     OBJECTIVE:   Vitals:  BP 140/82   Pulse 86   Ht 5\' 4"  (1.626 m)   Wt 142 lb 8 oz (64.6 kg)   BMI 24.46 kg/m  Physical Exam  Constitutional: She is oriented to person, place, and time. Vital signs are normal. She appears well-developed.  Pulmonary/Chest: Effort normal.  Genitourinary: Vagina normal.    There is no rash, tenderness or lesion on the right labia. There is no rash, tenderness or lesion on the left labia. Right adnexum displays no mass and no tenderness. Left adnexum displays no mass and no tenderness. No erythema or tenderness in the vagina. No vaginal discharge found.  Genitourinary Comments: UTERUS/CX SURG ABSENT  Musculoskeletal: Normal range of motion.  Neurological: She is alert and oriented to person, place, and time.  Psychiatric: She has a normal mood and affect. Her behavior is normal. Thought content normal.  Vitals reviewed.   Assessment/Plan: Vaginal lesion - LT labia minora since 6/19. Not clear etiology so vaginal bx today. Check pap/HPV. Will call with results and disposition.  - Plan: Cytology - PAP, Surgical pathology, CANCELED: Pathology  Cervical cancer screening - Plan: Cytology - PAP  Screening for HPV (human papillomavirus) - Plan: Cytology - PAP    Return if symptoms worsen or fail to improve.  Mary Sheperd B. Elvin Banker, PA-C 05/14/2018 3:27 PM    VULVAR BIOPSY NOTE The indications for vulvar biopsy (rule out neoplasia) were reviewed.   Risks of the biopsy including pain, bleeding, infection, inadequate specimen, scarring and need for additional procedures  were discussed. The patient stated understanding and agreed to undergo procedure today. Consent was signed,  time out performed.   The patient's vulva was prepped with Betadine. 1% lidocaine was injected into area of concern. A 4 -mm punch biopsy was done, biopsy tissue was picked up with  sterile forceps and sterile scissors were used to excise the lesion.  Small bleeding was noted and hemostasis was achieved using silver nitrate sticks.  The patient tolerated the procedure well. Post-procedure instructions  (pelvic rest for one week) were given to the patient. The patient is to call with heavy bleeding, fever greater than 100.4, foul smelling vaginal discharge or other concerns.

## 2018-05-14 NOTE — Patient Instructions (Signed)
I value your feedback and entrusting us with your care. If you get a [facility] patient survey, I would appreciate you taking the time to let us know about your experience today. Thank you! 

## 2018-05-16 LAB — CYTOLOGY - PAP: HPV: NOT DETECTED

## 2018-05-19 ENCOUNTER — Telehealth: Payer: Self-pay

## 2018-05-19 NOTE — Telephone Encounter (Signed)
Pt calling for results from biopsy on 7/24.

## 2018-05-19 NOTE — Telephone Encounter (Signed)
Not back yet. Can take a wk. I'll call her once I get them. RN to notify pt.

## 2018-05-19 NOTE — Telephone Encounter (Signed)
Pt aware.

## 2018-05-22 NOTE — Telephone Encounter (Signed)
Pt aware of neg bx results. Path showed inflammation, question dermatitis. No evidence of malignancy. Bx site is healing. F/u prn.

## 2018-05-23 NOTE — Telephone Encounter (Signed)
noted 

## 2018-05-26 ENCOUNTER — Telehealth: Payer: Self-pay

## 2018-05-26 NOTE — Telephone Encounter (Signed)
Pt called triage wanting to know if it was okay to go into a salt water pool. I have been advised by SDJ that it should be fine. Pt also wanted to know why she is having a bloated feeling sense her Bx I have advised her it doesn't seem to be related and to follow up with PCP

## 2018-06-03 ENCOUNTER — Encounter: Payer: Self-pay | Admitting: Family Medicine

## 2018-06-03 ENCOUNTER — Ambulatory Visit: Payer: Medicare Other | Admitting: Family Medicine

## 2018-06-03 VITALS — BP 130/78 | HR 78 | Temp 98.3°F | Resp 14 | Wt 142.5 lb

## 2018-06-03 DIAGNOSIS — N9089 Other specified noninflammatory disorders of vulva and perineum: Secondary | ICD-10-CM | POA: Diagnosis not present

## 2018-06-03 DIAGNOSIS — B3731 Acute candidiasis of vulva and vagina: Secondary | ICD-10-CM

## 2018-06-03 DIAGNOSIS — B373 Candidiasis of vulva and vagina: Secondary | ICD-10-CM

## 2018-06-03 MED ORDER — FLUCONAZOLE 150 MG PO TABS: ORAL_TABLET | ORAL | 0 refills | Status: DC

## 2018-06-03 NOTE — Progress Notes (Signed)
Subjective:    Patient ID: Mary Pacheco, female    DOB: 07/18/1941, 77 y.o.   MRN: [id number]  HPI   Patient presents to clinic due to some itching in her vaginal area.  Patient had a lesion and vulvar biopsy 28 July at GYN.  All cytology came back negative/benign.  She has had a hysterectomy due to history of endometriosis.  Patient is concerned area is not healing properly or she has possible yeast infection.    Patient Active Problem List   Diagnosis Date Noted  . Abdominal bloating 11/11/2017  . Routine general medical examination at a health care facility 05/22/2016  . Plantar fasciitis, right 02/21/2016  . Right sciatic nerve pain 10/11/2015  . Benign paroxysmal positional vertigo 04/11/2015  . 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 Colon cancer screening 05/17/2014  . Screening for breast cancer 04/06/2014  . Osteoarthritis, knee 04/06/2014  . Carotid stenosis 11/24/2013  . Overweight (BMI 25.0-29.9) 04/28/2013  . Medicare annual wellness visit, subsequent 03/20/2013  . Hypothyroidism 03/20/2013  . Anxiety 09/15/2012  . Herpes genitalis 01/09/2012  . GERD (gastroesophageal reflux disease) 01/09/2012  . Osteopenia 01/09/2012  . HTN (hypertension) 07/12/2011  . Hyperlipidemia, unspecified    Social History   Tobacco Use  . Smoking status: Never Smoker  . Smokeless tobacco: Never Used  Substance Use Topics  . Alcohol use: Yes    Comment: socially   Review of Systems   Constitutional: Negative for chills, fatigue and fever.  HENT: Negative for congestion, ear pain, sinus pain and sore throat.   Eyes: Negative.   Respiratory: Negative for cough, shortness of breath and wheezing.   Cardiovascular: Negative for chest pain, palpitations and leg swelling.  Gastrointestinal: Negative for abdominal pain, diarrhea, nausea and vomiting.  Genitourinary: Negative for dysuria, frequency and urgency.  Positive for itching vaginal area. Musculoskeletal: Negative for arthralgias  and myalgias.  Skin: Negative for color change, pallor and rash.  Neurological: Negative for syncope, light-headedness and headaches.  Psychiatric/Behavioral: The patient is not nervous/anxious.    Objective:   Physical Exam Physical Exam  Constitutional: She is oriented to person, place, and time. She appears well-developed and well-nourished. No distress.  Head: Normocephalic and atraumatic.  Cardiovascular: Normal rate, regular rhythm. Pulmonary/Chest: Effort normal. No respiratory distress.  Abdominal: Soft. Bowel sounds are normal. There is no tenderness.  GYN: Small biopsy area on left outer labia, appears to be healing well.  Small amount of thin white discharge otherwise vaginal mucosa appears healthy. Urethra appears normal.  Neurological: She is alert and oriented to person, place, and time.  Gait normal  Skin: Skin is warm and dry. No pallor.  Psychiatric: She has a normal mood and affect. Her behavior is normal. Thought content normal.  Nursing note and vitals reviewed.  Vitals:   06/03/18 0904 06/03/18 0946  BP: (!) 162/64 130/78  Pulse: 78   Resp: 14   Temp: 98.3 F (36.8 C)   SpO2: 98%       Assessment & Plan:   Lesion on vulva - Biopsy done 05/18/18 by GYN. Area appears to be healing well.  Patient to keep up with good intake of water.  Wear cotton underwear.  Cleanse vaginal area with mild soap rinse well with water and dry.  Vaginal yeast -- Small amount of thin whit discharge, will treat with 2 doses of diflucan.  Follow up if symptoms persist or worsen. Keep appt February 2020 as scheduled for medicare annual wellness.

## 2018-06-03 NOTE — Patient Instructions (Signed)
Great to meet you!  Area appears to be healing well

## 2018-06-05 ENCOUNTER — Telehealth: Payer: Self-pay

## 2018-06-05 DIAGNOSIS — Z1231 Encounter for screening mammogram for malignant neoplasm of breast: Secondary | ICD-10-CM

## 2018-06-05 NOTE — Telephone Encounter (Signed)
Copied from Time [id number]. Topic: Referral - Request >> Jun 04, 2018  4:00 PM Antonieta Iba C wrote: Reason for CRM: pt says that she received a letter from [facility] stating that it is time for her yearly mammo, pt would like to have provider get things set up for her. Their number at [facility] is : [phone]

## 2018-06-05 NOTE — Telephone Encounter (Signed)
Left voice mail for patient to call back ok for P[facility] to speak to patient please advise of appointment  Appointment scheduled for 06/30/18 @11 :31 at [place]

## 2018-06-05 NOTE — Addendum Note (Signed)
Addended by: Johna Sheriff on: 06/05/2018 05:03 PM   Modules accepted: Orders

## 2018-06-11 NOTE — Telephone Encounter (Signed)
Spoke to patient she is aware of appointment

## 2018-06-11 NOTE — Telephone Encounter (Signed)
Left voice mail for patient to call back ok for PEC to speak to patient , regarding appointment

## 2018-06-17 ENCOUNTER — Other Ambulatory Visit: Payer: Self-pay | Admitting: Family

## 2018-06-19 ENCOUNTER — Other Ambulatory Visit: Payer: Self-pay | Admitting: Family

## 2018-06-19 NOTE — Telephone Encounter (Signed)
Last office 12/27/17 No office visit scheduled  Last filled 03/24/18

## 2018-06-30 LAB — HM MAMMOGRAPHY

## 2018-07-16 ENCOUNTER — Encounter: Payer: Self-pay | Admitting: Family

## 2018-07-31 ENCOUNTER — Other Ambulatory Visit: Payer: Self-pay | Admitting: Family

## 2018-07-31 DIAGNOSIS — I10 Essential (primary) hypertension: Secondary | ICD-10-CM

## 2018-08-07 ENCOUNTER — Other Ambulatory Visit: Payer: Self-pay | Admitting: Family

## 2018-08-20 ENCOUNTER — Ambulatory Visit: Payer: Medicare Other | Admitting: Family

## 2018-08-20 ENCOUNTER — Encounter: Payer: Self-pay | Admitting: Family

## 2018-08-20 VITALS — BP 130/60 | HR 81 | Temp 98.1°F | Resp 16 | Ht 64.0 in | Wt 144.0 lb

## 2018-08-20 DIAGNOSIS — E039 Hypothyroidism, unspecified: Secondary | ICD-10-CM

## 2018-08-20 DIAGNOSIS — E782 Mixed hyperlipidemia: Secondary | ICD-10-CM

## 2018-08-20 DIAGNOSIS — I10 Essential (primary) hypertension: Secondary | ICD-10-CM | POA: Diagnosis not present

## 2018-08-20 DIAGNOSIS — Z23 Encounter for immunization: Secondary | ICD-10-CM

## 2018-08-20 DIAGNOSIS — R7303 Prediabetes: Secondary | ICD-10-CM | POA: Diagnosis not present

## 2018-08-20 MED ORDER — AMLODIPINE BESYLATE 2.5 MG PO TABS
2.5000 mg | ORAL_TABLET | Freq: Every day | ORAL | 1 refills | Status: DC
Start: 2018-08-20 — End: 2019-07-20

## 2018-08-20 MED ORDER — LISINOPRIL 5 MG PO TABS: 5.0000 mg | ORAL_TABLET | Freq: Every day | ORAL | 3 refills | Status: DC

## 2018-08-20 NOTE — Patient Instructions (Addendum)
Take tyleonol for pain NSAIDs raise blood pressure  Decrease amlodipine to 2.5mg  Start lisinopril  Labs in 5 days  Monitor blood pressure,  Goal is less than 120/80, based on newest guidelines; if persistently higher, please make sooner follow up appointment so we can recheck you blood pressure and manage medications Your goal for blood pressure is 130/80 as we discussed.   Today we discussed referrals, orders. Sleep apnea.   I have placed these orders in the system for you.  Please be sure to give [place] a call if you have not heard from our office regarding this. We should hear from [place] within ONE week with information regarding your appointment. If not, please let me know immediately.       Managing Your Hypertension Hypertension is commonly called high blood pressure. This is when the force of your blood pressing against the walls of your arteries is too strong. Arteries are blood vessels that carry blood from your heart throughout your body. Hypertension forces the heart to work harder to pump blood, and may cause the arteries to become narrow or stiff. Having untreated or uncontrolled hypertension can cause heart attack, stroke, kidney disease, and other problems. What are blood pressure readings? A blood pressure reading consists of a higher number over a lower number. Ideally, your blood pressure should be below 120/80. The first ("top") number is called the systolic pressure. It is a measure of the pressure in your arteries as your heart beats. The second ("bottom") number is called the diastolic pressure. It is a measure of the pressure in your arteries as the heart relaxes. What does my blood pressure reading mean? Blood pressure is classified into four stages. Based on your blood pressure reading, your health care provider may use the following stages to determine what type of treatment you need, if any. Systolic pressure and diastolic pressure are measured in a unit called mm  Hg. Normal  Systolic pressure: below 850.  Diastolic pressure: below 80. Elevated  Systolic pressure: 277-412.  Diastolic pressure: below 80. Hypertension stage 1  Systolic pressure: 878-676.  Diastolic pressure: 72-09. Hypertension stage 2  Systolic pressure: 470 or above.  Diastolic pressure: 90 or above. What health risks are associated with hypertension? Managing your hypertension is an important responsibility. Uncontrolled hypertension can lead to:  A heart attack.  A stroke.  A weakened blood vessel (aneurysm).  Heart failure.  Kidney damage.  Eye damage.  Metabolic syndrome.  Memory and concentration problems.  What changes can I make to manage my hypertension? Hypertension can be managed by making lifestyle changes and possibly by taking medicines. Your health care provider will help you make a plan to bring your blood pressure within a normal range. Eating and drinking  Eat a diet that is high in fiber and potassium, and low in salt (sodium), added sugar, and fat. An example eating plan is called the DASH (Dietary Approaches to Stop Hypertension) diet. To eat this way: ? Eat plenty of fresh fruits and vegetables. Try to fill half of your plate at each meal with fruits and vegetables. ? Eat whole grains, such as whole wheat pasta, brown rice, or whole grain bread. Fill about one quarter of your plate with whole grains. ? Eat low-fat diary products. ? Avoid fatty cuts of meat, processed or cured meats, and poultry with skin. Fill about one quarter of your plate with lean proteins such as fish, chicken without skin, beans, eggs, and tofu. ? Avoid premade and processed  foods. These tend to be higher in sodium, added sugar, and fat.  Reduce your daily sodium intake. Most people with hypertension should eat less than 1,500 mg of sodium a day.  Limit alcohol intake to no more than 1 drink a day for nonpregnant women and 2 drinks a day for men. One drink equals  12 oz of beer, 5 oz of wine, or 1 oz of hard liquor. Lifestyle  Work with your health care provider to maintain a healthy body weight, or to lose weight. Ask what an ideal weight is for you.  Get at least 30 minutes of exercise that causes your heart to beat faster (aerobic exercise) most days of the week. Activities may include walking, swimming, or biking.  Include exercise to strengthen your muscles (resistance exercise), such as weight lifting, as part of your weekly exercise routine. Try to do these types of exercises for 30 minutes at least 3 days a week.  Do not use any products that contain nicotine or tobacco, such as cigarettes and e-cigarettes. If you need help quitting, ask your health care provider.  Control any long-term (chronic) conditions you have, such as high cholesterol or diabetes. Monitoring  Monitor your blood pressure at home as told by your health care provider. Your personal target blood pressure may vary depending on your medical conditions, your age, and other factors.  Have your blood pressure checked regularly, as often as told by your health care provider. Working with your health care provider  Review all the medicines you take with your health care provider because there may be side effects or interactions.  Talk with your health care provider about your diet, exercise habits, and other lifestyle factors that may be contributing to hypertension.  Visit your health care provider regularly. Your health care provider can help you create and adjust your plan for managing hypertension. Will I need medicine to control my blood pressure? Your health care provider may prescribe medicine if lifestyle changes are not enough to get your blood pressure under control, and if:  Your systolic blood pressure is 130 or higher.  Your diastolic blood pressure is 80 or higher.  Take medicines only as told by your health care provider. Follow the directions carefully.  Blood pressure medicines must be taken as prescribed. The medicine does not work as well when you skip doses. Skipping doses also puts you at risk for problems. Contact a health care provider if:  You think you are having a reaction to medicines you have taken.  You have repeated (recurrent) headaches.  You feel dizzy.  You have swelling in your ankles.  You have trouble with your vision. Get help right away if:  You develop a severe headache or confusion.  You have unusual weakness or numbness, or you feel faint.  You have severe pain in your chest or abdomen.  You vomit repeatedly.  You have trouble breathing. Summary  Hypertension is when the force of blood pumping through your arteries is too strong. If this condition is not controlled, it may put you at risk for serious complications.  Your personal target blood pressure may vary depending on your medical conditions, your age, and other factors. For most people, a normal blood pressure is less than 120/80.  Hypertension is managed by lifestyle changes, medicines, or both. Lifestyle changes include weight loss, eating a healthy, low-sodium diet, exercising more, and limiting alcohol. This information is not intended to replace advice given to you by your health care  provider. Make sure you discuss any questions you have with your health care provider. Document Released: 07/02/2012 Document Revised: 09/05/2016 Document Reviewed: 09/05/2016 Elsevier Interactive Patient Education  [employer].

## 2018-08-20 NOTE — Assessment & Plan Note (Signed)
Overall pleased with blood pressure control at home.  However patient is experiencing lower extremity edema.  Reduced dose of amlodipine.  Patient will start lisinopril low-dose and come back to repeat labs in 5 days.  She also monitor blood pressure at home.  Discussed her goal of blood pressure to be closer to 130/80 versus the newer guidelines of 120/80.  Patient felt more comfortable with this goal.

## 2018-08-20 NOTE — Assessment & Plan Note (Signed)
On lipitor. Pending lipid panel.

## 2018-08-20 NOTE — Progress Notes (Signed)
Subjective:    Patient ID: Mary Pacheco, female    DOB: [birth year], 77 y.o.   MRN: [id number]  CC: Mary Pacheco is a 77 y.o. female who presents today for follow up.   HPI: HTN- increased amlodipine at last visit, ankle swollen At home 114/58 to 131/68. Denies exertional chest pain or pressure, numbness or tingling radiating to left arm or jaw, palpitations, dizziness, frequent headaches, changes in vision, or shortness of breath.   Snores.   No longer needing celebrex for knee pain.   HLD- lipitor 20mg  QD.    H/o prediabetic . Would like a1c checked.   GERD- dr Mary Pacheco 10/2017 ; continue protonix 40mg  QD  HISTORY:  Past Medical History:  Diagnosis Date  . Anxiety 2011   patient with anxiety will continue Alprazolam prn  . Arthritis   . GERD (gastroesophageal reflux disease)    pt. with esophagea reflux. She reports great improvement with Dexilant.  [name] Hearing loss    s/p hearing aids  . 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 Hyperlipidemia    patient with HL on statins. lipids well controlled  . Hypertension    Past Surgical History:  Procedure Laterality Date  . ABDOMINAL HYSTERECTOMY  1993   multiple fibroid cysts  . cataract surgery  02/2011   bilateral  . TONSILLECTOMY     Family History  Problem Relation Age of Onset  . Heart failure Mother   . Heart disease Mother        CABG  . Hypertension Mother   . Hyperlipidemia Mother   . Stroke Mother        mini strokes, multiple  . Heart attack Father   . Heart failure Father   . Hyperlipidemia Father   . Hypertension Father   . Heart attack Sister 54       MI    Allergies: Molds & smuts; Rosuvastatin; and Simvastatin Current Outpatient Medications on File Prior to Visit  Medication Sig Dispense Refill  . acyclovir (ZOVIRAX) 400 MG tablet TAKE 1 TABLET BY MOUTH EVERY DAY 90 tablet 2  . ALPRAZolam (XANAX) 0.25 MG tablet TAKE 1 TABLET BY MOUTH 2 TIMES A DAY AS NEEDED FOR ANXIETY 30 tablet 0  . aspirin 81 MG tablet  Take 81 mg by mouth daily.      [name] atorvastatin (LIPITOR) 20 MG tablet TAKE 1 TABLET BY MOUTH EVERY DAY 90 tablet 1  . cholecalciferol (VITAMIN D) 1000 UNITS tablet Take 1,000 Units by mouth daily.      . Coenzyme Q10 (EQL COQ10) 300 MG CAPS Take by mouth.    . Flaxseed, Linseed, (FLAX SEED OIL PO) Take 1,400 mg by mouth daily.    Nyoka Cowden Tea 150 MG CAPS Take by mouth.    . hydrochlorothiazide (MICROZIDE) 12.5 MG capsule TAKE 1 CAPSULE DAILY (MUST ESTABLISH WITH NEW PRIMARY CARE PHYSICIAN) 90 capsule 4  . levothyroxine (SYNTHROID, LEVOTHROID) 50 MCG tablet TAKE 1 TABLET DAILY 90 tablet 4  . Misc Natural Products (TOTAL CARDIO HEALTH FORMULA PO) Take 1 tablet by mouth daily.      . montelukast (SINGULAIR) 10 MG tablet TAKE 1 TABLET AT BEDTIME 90 tablet 1  . Omega-3 Fatty Acids (FISH OIL TRIPLE STRENGTH) 1400 MG CAPS Take by mouth.    . pantoprazole (PROTONIX) 40 MG tablet TAKE 1 TABLET (40 MG TOTAL) BY MOUTH DAILY.    . Probiotic Product (PROBIOTIC DAILY PO) Take by mouth.     No current  facility-administered medications on file prior to visit.     Social History   Tobacco Use  . Smoking status: Never Smoker  . Smokeless tobacco: Never Used  Substance Use Topics  . Alcohol use: Yes    Comment: socially  . Drug use: No    Review of Systems  Constitutional: Negative for chills and fever.  Respiratory: Negative for cough and shortness of breath.   Cardiovascular: Positive for leg swelling. Negative for chest pain and palpitations.  Gastrointestinal: Negative for nausea and vomiting.      Objective:    BP 130/60   Pulse 81   Temp 98.1 F (36.7 C) (Oral)   Resp 16   Ht 5\' 4"  (1.626 m)   Wt 144 lb (65.3 kg)   SpO2 97%   BMI 24.72 kg/m  BP Readings from Last 3 Encounters:  08/20/18 130/60  06/03/18 130/78  05/14/18 140/82   Wt Readings from Last 3 Encounters:  08/20/18 144 lb (65.3 kg)  06/03/18 142 lb 8 oz (64.6 kg)  05/14/18 142 lb 8 oz (64.6 kg)    Physical Exam    Constitutional: She appears well-developed and well-nourished.  Eyes: Conjunctivae are normal.  Cardiovascular: Normal rate, regular rhythm, normal heart sounds and normal pulses.  Trace BL edema, non pitting No palpable cords or masses. No erythema or increased warmth. No asymmetry in calf size when compared bilaterally LE hair growth symmetric and present. No discoloration of varicosities noted. LE warm and palpable pedal pulses.   Pulmonary/Chest: Effort normal and breath sounds normal. She has no wheezes. She has no rhonchi. She has no rales.  Neurological: She is alert.  Skin: Skin is warm and dry.  Psychiatric: She has a normal mood and affect. Her speech is normal and behavior is normal. Thought content normal.  Vitals reviewed.      Assessment & Plan:   Problem List Items Addressed This Visit      Cardiovascular and Mediastinum   HTN (hypertension) - Primary    Overall pleased with blood pressure control at home.  However patient is experiencing lower extremity edema.  Reduced dose of amlodipine.  Patient will start lisinopril low-dose and come back to repeat labs in 5 days.  She also monitor blood pressure at home.  Discussed her goal of blood pressure to be closer to 130/80 versus the newer guidelines of 120/80.  Patient felt more comfortable with this goal.       Relevant Medications   lisinopril (PRINIVIL,ZESTRIL) 5 MG tablet   amLODipine (NORVASC) 2.5 MG tablet   Other Relevant Orders   Comprehensive metabolic panel   Ambulatory referral to Sleep Studies     Endocrine   Hypothyroidism   Relevant Orders   TSH     Other   Hyperlipidemia, unspecified    On lipitor. Pending lipid panel.       Relevant Medications   lisinopril (PRINIVIL,ZESTRIL) 5 MG tablet   amLODipine (NORVASC) 2.5 MG tablet   Other Relevant Orders   Lipid panel   Prediabetes    Pending lab.       Relevant Orders   Hemoglobin A1c       I have discontinued Mary Pacheco. [place] "Mary Pacheco"'s  celecoxib, methocarbamol, and fluconazole. I have also changed her amLODipine. Additionally, I am having her start on lisinopril. Lastly, I am having her maintain her Misc Natural Products ([place]), aspirin, cholecalciferol, (Flaxseed, Linseed, (FLAX SEED OIL PO)), FISH OIL TRIPLE STRENGTH, Coenzyme Q10,  Probiotic Product (PROBIOTIC DAILY PO), Green Tea, pantoprazole, ALPRAZolam, atorvastatin, montelukast, levothyroxine, acyclovir, and hydrochlorothiazide.   Meds ordered this encounter  Medications  . lisinopril (PRINIVIL,ZESTRIL) 5 MG tablet    Sig: Take 1 tablet (5 mg total) by mouth daily.    Dispense:  90 tablet    Refill:  3    Order Specific Question:   Supervising Provider    Answer:   Deborra Medina L [[id number]]  . amLODipine (NORVASC) 2.5 MG tablet    Sig: Take 1 tablet (2.5 mg total) by mouth daily.    Dispense:  90 tablet    Refill:  1    Order Specific Question:   Supervising Provider    Answer:   Crecencio Mc [[id number]]    Return precautions given.   Risks, benefits, and alternatives of the medications and treatment plan prescribed today were discussed, and patient expressed understanding.   Education regarding symptom management and diagnosis given to patient on AVS.  Continue to follow with Burnard Hawthorne, FNP for routine health maintenance.   [name] and I agreed with plan.   Mable Paris, FNP

## 2018-08-20 NOTE — Assessment & Plan Note (Signed)
Pending lab 

## 2018-08-22 ENCOUNTER — Other Ambulatory Visit: Payer: Self-pay | Admitting: Family

## 2018-08-22 NOTE — Telephone Encounter (Signed)
Last refill 01/19/18 Last office visit 08/20/18

## 2018-08-25 ENCOUNTER — Other Ambulatory Visit (INDEPENDENT_AMBULATORY_CARE_PROVIDER_SITE_OTHER): Payer: Medicare Other

## 2018-08-25 DIAGNOSIS — E782 Mixed hyperlipidemia: Secondary | ICD-10-CM

## 2018-08-25 DIAGNOSIS — I10 Essential (primary) hypertension: Secondary | ICD-10-CM | POA: Diagnosis not present

## 2018-08-25 DIAGNOSIS — R7303 Prediabetes: Secondary | ICD-10-CM

## 2018-08-25 DIAGNOSIS — E039 Hypothyroidism, unspecified: Secondary | ICD-10-CM

## 2018-08-25 LAB — COMPREHENSIVE METABOLIC PANEL
ALBUMIN: 4.8 g/dL (ref 3.5–5.2)
ALT: 20 U/L (ref 0–35)
AST: 22 U/L (ref 0–37)
Alkaline Phosphatase: 79 U/L (ref 39–117)
BUN: 16 mg/dL (ref 6–23)
CHLORIDE: 102 meq/L (ref 96–112)
CO2: 29 mEq/L (ref 19–32)
CREATININE: 0.87 mg/dL (ref 0.40–1.20)
Calcium: 9.9 mg/dL (ref 8.4–10.5)
GFR: 66.92 mL/min (ref >60.00)
Glucose, Bld: 120 mg/dL — ABNORMAL HIGH (ref 70–99)
Potassium: 3.9 mEq/L (ref 3.5–5.1)
SODIUM: 138 meq/L (ref 135–145)
Total Bilirubin: 0.7 mg/dL (ref 0.2–1.2)
Total Protein: 7.5 g/dL (ref 6.0–8.3)

## 2018-08-25 LAB — LIPID PANEL
Cholesterol: 169 mg/dL (ref 0–200)
HDL: 79.4 mg/dL (ref >39.00)
LDL Cholesterol: 75 mg/dL (ref 0–99)
NONHDL: 89.11
TRIGLYCERIDES: 73 mg/dL (ref 0.0–149.0)
Total CHOL/HDL Ratio: 2
VLDL: 14.6 mg/dL (ref 0.0–40.0)

## 2018-08-25 LAB — HEMOGLOBIN A1C: Hgb A1c MFr Bld: 6 % (ref 4.6–6.5)

## 2018-08-25 LAB — TSH: TSH: 2.9 u[IU]/mL (ref 0.35–4.50)

## 2018-09-12 ENCOUNTER — Ambulatory Visit: Payer: Self-pay

## 2018-09-12 ENCOUNTER — Other Ambulatory Visit: Payer: Self-pay | Admitting: Physical Medicine and Rehabilitation

## 2018-09-12 DIAGNOSIS — M545 Low back pain, unspecified: Secondary | ICD-10-CM

## 2018-09-12 DIAGNOSIS — G8929 Other chronic pain: Secondary | ICD-10-CM

## 2018-09-12 NOTE — Telephone Encounter (Signed)
Call pt Yes, she may cut in half.  Please ensure HA is not severe.   Advise appt with [place], particularly if symptoms dont resolve.   If worsen over weekend , please advise urgent care.

## 2018-09-12 NOTE — Telephone Encounter (Signed)
Spoke to patient she states she wakes up with headache not severe, when she gets up and moves around headache goes away. She is drinking plenty of fluids and isn't having any sinus/cold symptoms. She will contact us if symptoms don't resolve or if worsen over the weekend she will go to urgent care.

## 2018-09-12 NOTE — Telephone Encounter (Signed)
Pt. Reports she is having side effects from Lisinopril. Having difficulty getting sleep and then has a hard time waking up.Some dizziness and headaches since starting Lisinopril. BP past 2 days  -  122/62  119/55. Pt. Would like to start cutting pill in half. Please advise pt. Answer Assessment - Initial Assessment Questions 1. SYMPTOMS: "Do you have any symptoms?"      Has difficulty getting to sleep and the has a hard time getting up, headache,dizziness 2. SEVERITY: If symptoms are present, ask "Are they mild, moderate or severe?"       Moderate  Protocols used: MEDICATION QUESTION CALL-A-AH

## 2018-09-12 NOTE — Telephone Encounter (Signed)
Please advise 

## 2018-09-12 NOTE — Telephone Encounter (Signed)
noted 

## 2018-09-23 ENCOUNTER — Encounter: Payer: Self-pay | Admitting: Neurology

## 2018-09-23 ENCOUNTER — Ambulatory Visit: Payer: Medicare Other | Admitting: Neurology

## 2018-09-23 VITALS — BP 168/70 | HR 76 | Ht 63.0 in | Wt 144.0 lb

## 2018-09-23 DIAGNOSIS — E663 Overweight: Secondary | ICD-10-CM | POA: Diagnosis not present

## 2018-09-23 DIAGNOSIS — R0683 Snoring: Secondary | ICD-10-CM

## 2018-09-23 NOTE — Patient Instructions (Addendum)
Thank you for choosing [facility] for your sleep related care! It was nice to meet you today! I appreciate that you entrust me with your sleep related healthcare concerns. I hope, I was able to address at least some of your concerns today, and that I can help you feel reassured and also get better.    Here is what we discussed today and what we came up with as our plan for you:    Based on your symptoms and your exam I believe you seem to be at low risk for obstructive sleep apnea (aka OSA), but we can do a sleep study to determine whether you do or do not have OSA and how severe it is. We can wait things out and you can call our office if you decide to schedule a sleep study.   Even, if you have mild OSA, I may want you to consider treatment with CPAP, as treatment of even borderline or mild sleep apnea can result and improvement of symptoms such as sleep disruption, daytime sleepiness, nighttime bathroom breaks, restless leg symptoms, improvement of headache syndromes, even improved mood disorder.   Please remember, the long-term risks and ramifications of untreated moderate to severe obstructive sleep apnea are: increased Cardiovascular disease, including congestive heart failure, stroke, difficult to control hypertension, treatment resistant obesity, arrhythmias, especially irregular heartbeat commonly known as A. Fib. (atrial fibrillation); even type 2 diabetes has been linked to untreated OSA.   Sleep apnea can cause disruption of sleep and sleep deprivation in most cases, which, in turn, can cause recurrent headaches, problems with memory, mood, concentration, focus, and vigilance. Most people with untreated sleep apnea report excessive daytime sleepiness, which can affect their ability to drive. Please do not drive if you feel sleepy. Patients with sleep apnea developed difficulty initiating and maintaining sleep (aka insomnia).   Having sleep apnea may increase your risk for  other sleep disorders, including involuntary behaviors sleep such as sleep terrors, sleep talking, sleepwalking.    Having sleep apnea can also increase your risk for restless leg syndrome and leg movements at night.   Please note that untreated obstructive sleep apnea may carry additional perioperative morbidity. Patients with significant obstructive sleep apnea (typically, in the moderate to severe degree) should receive, if possible, perioperative PAP (positive airway pressure) therapy and the surgeons and particularly the anesthesiologists should be informed of the diagnosis and the severity of the sleep disordered breathing.   I will likely see you back after your sleep study to go over the test results and where to go from there. We will call you after your sleep study to advise about the results (most likely, you will hear from [place], my nurse) and to set up an appointment at the time, as necessary.    Our sleep lab administrative assistant will call you to schedule your sleep study and give you further instructions, regarding the check in process for the sleep study, arrival time, what to bring, when you can expect to leave after the study, etc., and to answer any other logistical questions you may have. If you don't hear back from her by about 2 weeks from now, please feel free to call her direct line at [phone] or you can call our general clinic number, or email [place] through My Chart.

## 2018-09-23 NOTE — Progress Notes (Signed)
Subjective:    Patient ID: Mary Pacheco is a 77 y.o. female.  HPI     Star Age, MD, PhD [facility] [address] [place], [place] [address]  Dear Joycelyn Schmid,   I saw your patient, Mary Pacheco, upon your kind request in my sleep clinic today for initial consultation of her sleep disorder, in particular, concern for underlying obstructive sleep apnea. The patient is accompanied by her husband today. As you know, Mary Pacheco is a 77 year old right-handed woman with an underlying medical history of hypertension, hyperlipidemia, hearing loss, reflux disease, anxiety, and arthritis, who reports snoring. I reviewed your office note from 08/20/2018. Her Epworth sleepiness score is 6 out of 24 today, fatigue score is 13 out of 63. She is married and lives with her husband, she is retired. She has 3 stepchildren, is a nonsmoker and drinks alcohol, one serving per day on average (one vodka tonic), and does not drink caffeine on a regular basis. She fell last year on the side walk, in Dec. 2018. She denies telltale symptoms of restless leg syndrome but does have left knee pain. She has seen a specialist for this. She has occasional discomfort at night and left knee tends to swell from time to time. She also recently was noted to have ankle swelling and her amlodipine was reduced to 2.5 mg daily and her swelling improved. She is not aware of any family history of sleep apnea, denies morning headaches, has nocturia about once per average night. She is typically in bed between 10 and 11, sometimes around midnight. She does not typically watch TV in her bedroom. Her snoring is typically softer and she makes popping sounds but no apneas have been noted by her husband. She does not wake up with a sense of gasping. She denies any frequent awakenings in the middle of the night. She feels fairly well rested first thing in the morning and does not tend to nap during the day.  She had a tonsillectomy as a preschooler. She has had intermittent TMJ issues of bruxism and has a dentist made bite guard but does not always use it. She retired 22 years ago but is very active. She has woken up with mouth dryness at times. Her blood pressure is better at home, she typically has numbers in the one tens to 20s over 60s and 70s.   Her Past Medical History Is Significant For: Past Medical History:  Diagnosis Date  . Anxiety 2011   patient with anxiety will continue Alprazolam prn  . Arthritis   . GERD (gastroesophageal reflux disease)    pt. with esophagea reflux. She reports great improvement with Dexilant.  [name] Hearing loss    s/p hearing aids  . 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 Hyperlipidemia    patient with HL on statins. lipids well controlled  . Hypertension     Her Past Surgical History Is Significant For: Past Surgical History:  Procedure Laterality Date  . ABDOMINAL HYSTERECTOMY  1993   multiple fibroid cysts  . cataract surgery  02/2011   bilateral  . TONSILLECTOMY      Her Family History Is Significant For: Family History  Problem Relation Age of Onset  . Heart failure Mother   . Heart disease Mother        CABG  . Hypertension Mother   . Hyperlipidemia Mother   . Stroke Mother        mini strokes, multiple  .  Heart attack Father   . Heart failure Father   . Hyperlipidemia Father   . Hypertension Father   . Heart attack Sister 11       MI    Her Social History Is Significant For: Social History   Socioeconomic History  . Marital status: Married    Spouse name: Not on file  . Number of children: Not on file  . Years of education: Not on file  . Highest education level: Not on file  Occupational History  . Not on file  Social Needs  . Financial resource strain: Not on file  . Food insecurity:    Worry: Not on file    Inability: Not on file  . Transportation needs:    Medical: No    Non-medical: No  Tobacco Use  . Smoking status:  Never Smoker  . Smokeless tobacco: Never Used  Substance and Sexual Activity  . Alcohol use: Yes    Comment: socially  . Drug use: No  . Sexual activity: Yes    Birth control/protection: Post-menopausal  Lifestyle  . Physical activity:    Days per week: Not on file    Minutes per session: Not on file  . Stress: Not on file  Relationships  . Social connections:    Talks on phone: Not on file    Gets together: Not on file    Attends religious service: Not on file    Active member of club or organization: Not on file    Attends meetings of clubs or organizations: Not on file    Relationship status: Not on file  Other Topics Concern  . Not on file  Social History Narrative   From [employer]      Retired Education officer, museum      married    Her Allergies Are:  Allergies  Allergen Reactions  . Molds & Smuts   . Rosuvastatin Hives  . Simvastatin     myalgia  :   Her Current Medications Are:  Outpatient Encounter Medications as of 09/23/2018  Medication Sig  . acyclovir (ZOVIRAX) 400 MG tablet TAKE 1 TABLET BY MOUTH EVERY DAY  . ALPRAZolam (XANAX) 0.25 MG tablet TAKE 1 TABLET BY MOUTH TWICE DAILY AS NEEDED FOR ANXIETY  . amLODipine (NORVASC) 2.5 MG tablet Take 1 tablet (2.5 mg total) by mouth daily.  [name] aspirin 81 MG tablet Take 81 mg by mouth daily.    [name] atorvastatin (LIPITOR) 20 MG tablet TAKE 1 TABLET BY MOUTH EVERY DAY  . cholecalciferol (VITAMIN D) 1000 UNITS tablet Take 1,000 Units by mouth daily.    . Coenzyme Q10 (EQL COQ10) 300 MG CAPS Take by mouth.  . Flaxseed, Linseed, (FLAX SEED OIL PO) Take 1,400 mg by mouth daily.  Nyoka Cowden Tea 150 MG CAPS Take by mouth.  . hydrochlorothiazide (MICROZIDE) 12.5 MG capsule TAKE 1 CAPSULE DAILY (MUST ESTABLISH WITH NEW PRIMARY CARE PHYSICIAN)  . levothyroxine (SYNTHROID, LEVOTHROID) 50 MCG tablet TAKE 1 TABLET DAILY  . lisinopril (PRINIVIL,ZESTRIL) 5 MG tablet Take 1 tablet (5 mg total) by mouth daily. (Patient taking  differently: Take 2.5 mg by mouth daily. )  . Misc Natural Products (TOTAL CARDIO HEALTH FORMULA PO) Take 1 tablet by mouth daily.    . montelukast (SINGULAIR) 10 MG tablet TAKE 1 TABLET AT BEDTIME  . Omega-3 Fatty Acids (FISH OIL TRIPLE STRENGTH) 1400 MG CAPS Take by mouth.  . pantoprazole (PROTONIX) 40 MG tablet TAKE 1 TABLET (40 MG TOTAL) BY  MOUTH DAILY.  . Probiotic Product (PROBIOTIC DAILY PO) Take by mouth.   No facility-administered encounter medications on file as of 09/23/2018.   :  Review of Systems:  Out of a complete 14 point review of systems, all are reviewed and negative with the exception of these symptoms as listed below: Review of Systems  Neurological:       Pt presents today to discuss her sleep. Pt has never had a sleep study and does not endorse snoring.  Epworth Sleepiness Scale 0= would never doze 1= slight chance of dozing 2= moderate chance of dozing 3= high chance of dozing  Sitting and reading: 1 Watching TV: 1 Sitting inactive in a public place (ex. Theater or meeting): 0 As a passenger in a car for an hour without a break: 1 Lying down to rest in the afternoon: 2 Sitting and talking to someone: 0 Sitting quietly after lunch (no alcohol): 1 In a car, while stopped in traffic: 0 Total: 6     Objective:  Neurological Exam  Physical Exam Physical Examination:   Vitals:   09/23/18 1058  BP: (!) 168/70  Pulse: 76    General Examination: The patient is a very pleasant 77 y.o. female in no acute distress. She appears well-developed and well-nourished and well groomed.   HEENT: Normocephalic, atraumatic, pupils are equal, round and reactive to light and accommodation. Extraocular tracking is good without limitation to gaze excursion or nystagmus noted. Normal smooth pursuit is noted. Hearing is grossly intact. Face is symmetric with normal facial animation and normal facial sensation. Speech is clear with no dysarthria noted. There is no hypophonia.  There is no lip, neck/head, jaw or voice tremor. Neck is supple with full range of passive and active motion. There are no carotid bruits on auscultation. Oropharynx exam reveals: mild mouth dryness, good dental hygiene and mild airway crowding, due to smaller airway entry. Mallampati is class I. Tongue protrudes centrally and palate elevates symmetrically. Tonsils are absent. Neck size is 14 5/8 inches.    Chest: Clear to auscultation without wheezing, rhonchi or crackles noted.  Heart: S1+S2+0, regular and normal without murmurs, rubs or gallops noted.   Abdomen: Soft, non-tender and non-distended with normal bowel sounds appreciated on auscultation.  Extremities: There is no pitting edema in the distal lower extremities bilaterally. Pedal pulses are intact.  Skin: Warm and dry without trophic changes noted.  Musculoskeletal: exam reveals no obvious joint deformities, tenderness or joint swelling or erythema, with the exception of mild left knee swelling.   Neurologically:  Mental status: The patient is awake, alert and oriented in all 4 spheres. Her immediate and remote memory, attention, language skills and fund of knowledge are appropriate. There is no evidence of aphasia, agnosia, apraxia or anomia. Speech is clear with normal prosody and enunciation. Thought process is linear. Mood is normal and affect is normal.  Cranial nerves II - XII are as described above under HEENT exam. In addition: shoulder shrug is normal with equal shoulder height noted. Motor exam: Normal bulk, strength and tone is noted. There is no tremor. Fine motor skills and coordination: intact with normal finger taps, normal hand movements, normal rapid alternating patting, normal foot taps and normal foot agility.  Cerebellar testing: No dysmetria or intention tremor on finger to nose testing. Heel to shin is unremarkable bilaterally. There is no truncal or gait ataxia.  Sensory exam: intact to light touch in the upper  and lower extremities.  Gait, station and balance:  She stands easily. No veering to one side is noted. No leaning to one side is noted. Posture is age-appropriate and stance is narrow based. Gait shows normal stride length and normal pace. No problems turning are noted.                Assessment and plan:  In summary, Mary Pacheco is a very pleasant 77 y.o.-year old female with an underlying medical history of hypertension, hyperlipidemia, hearing loss, reflux disease, anxiety, and arthritis, who presents for evaluation of her snoring. Her history is not very telltale for obstructive sleep apnea, in particular, she does not have much in the way of sleep related complaints such as morning headaches, significant nocturia or excessive daytime somnolence. Her physical exam is also on the reassuring side in that she has a slender neck circumference, no significant airway crowding and no obesity. Nevertheless, to rule out obstructive sleep apnea, a sleep study would be necessary. She has decided to hold off at this time. She is encouraged to call our office should she decide to proceed with a sleep study. I had a long chat with the patient and her husband about my findings and the diagnosis of OSA, its prognosis and treatment options. We talked about medical treatments, surgical interventions and non-pharmacological approaches. I explained in particular the risks and ramifications of untreated moderate to severe OSA, especially with respect to developing cardiovascular disease down the Road, including congestive heart failure, difficult to treat hypertension, cardiac arrhythmias, or stroke. Even type 2 diabetes has, in part, been linked to untreated OSA. Symptoms of untreated OSA include daytime sleepiness, memory problems, mood irritability and mood disorder such as depression and anxiety, lack of energy, as well as recurrent headaches, especially morning headaches. We talked about trying to maintain a healthy  lifestyle in general, as well as the importance of weight control. I encouraged the patient to eat healthy, exercise daily and keep well hydrated, to keep a scheduled bedtime and wake time routine, to not skip any meals and eat healthy snacks in between meals. I advised the patient not to drive when feeling sleepy.  I explained the sleep test procedure to the patient and her husband and also outlined possible surgical and non-surgical treatment options of OSA. At this time, she will monitor her symptoms and will call our office if she would like to pursue a sleep study. I answered all their questions today and the patient and her husband were in agreement.   Thank you very much for allowing me to participate in the care of this nice patient. If I can be of any further assistance to you please do not hesitate to call me at [phone].  Sincerely,   Star Age, MD, PhD

## 2018-09-28 ENCOUNTER — Other Ambulatory Visit: Payer: Self-pay | Admitting: Family

## 2018-09-30 ENCOUNTER — Telehealth: Payer: Self-pay | Admitting: *Deleted

## 2018-10-01 ENCOUNTER — Ambulatory Visit: Payer: Medicare Other

## 2018-10-28 ENCOUNTER — Ambulatory Visit: Payer: Medicare Other

## 2018-10-29 ENCOUNTER — Ambulatory Visit: Admission: RE | Admit: 2018-10-29 | Payer: Medicare Other | Source: Ambulatory Visit

## 2018-10-30 ENCOUNTER — Telehealth (INDEPENDENT_AMBULATORY_CARE_PROVIDER_SITE_OTHER): Payer: Self-pay

## 2018-10-30 NOTE — Telephone Encounter (Signed)
Patient called thinking she was scheduled for a MRI that should be in [place]. I explained that she was scheduled in our office on 11/03/2018 for an ultrasound and to be seen. Patient stated she may have to cancel, but would call tomorrow if she needed to cancel.

## 2018-10-31 ENCOUNTER — Other Ambulatory Visit: Payer: Self-pay | Admitting: Family Medicine

## 2018-10-31 DIAGNOSIS — G8929 Other chronic pain: Secondary | ICD-10-CM

## 2018-10-31 DIAGNOSIS — M545 Low back pain, unspecified: Secondary | ICD-10-CM

## 2018-11-03 ENCOUNTER — Encounter (INDEPENDENT_AMBULATORY_CARE_PROVIDER_SITE_OTHER): Payer: Medicare Other

## 2018-11-03 ENCOUNTER — Ambulatory Visit (INDEPENDENT_AMBULATORY_CARE_PROVIDER_SITE_OTHER): Payer: Medicare Other | Admitting: Vascular Surgery

## 2018-11-06 ENCOUNTER — Other Ambulatory Visit: Payer: Self-pay | Admitting: Family

## 2018-11-06 MED ORDER — HYDROCHLOROTHIAZIDE 12.5 MG PO CAPS: 12.5000 mg | ORAL_CAPSULE | Freq: Every day | ORAL | 3 refills | Status: DC

## 2018-11-06 NOTE — Telephone Encounter (Signed)
Copied from [place] [id number]. Topic: Quick Communication - Rx Refill/Question >> Nov 06, 2018  9:53 AM Selinda Flavin B, NT wrote: Medication: hydrochlorothiazide (MICROZIDE) 12.5 MG capsule  Has the patient contacted their pharmacy? Yes.  Changed mail order pharmacies. Needing new RX (Agent: If no, request that the patient contact the pharmacy for the refill.) (Agent: If yes, when and what did the pharmacy advise?)  Preferred Pharmacy (with phone number or street name): [place], [place]: Please be advised that RX refills may take up to 3 business days. We ask that you follow-up with your pharmacy.

## 2018-11-10 ENCOUNTER — Other Ambulatory Visit: Payer: Self-pay

## 2018-11-10 MED ORDER — PANTOPRAZOLE SODIUM 40 MG PO TBEC: DELAYED_RELEASE_TABLET | ORAL | 0 refills | Status: DC

## 2018-11-12 ENCOUNTER — Ambulatory Visit
Admission: RE | Admit: 2018-11-12 | Discharge: 2018-11-12 | Disposition: A | Discharge status: Home / Self Care | Payer: Medicare Other | Source: Ambulatory Visit | Attending: Family Medicine | Admitting: Family Medicine

## 2018-11-12 ENCOUNTER — Ambulatory Visit: Payer: Medicare Other

## 2018-11-12 DIAGNOSIS — G8929 Other chronic pain: Secondary | ICD-10-CM | POA: Insufficient documentation

## 2018-11-12 DIAGNOSIS — M545 Low back pain: Secondary | ICD-10-CM | POA: Diagnosis present

## 2018-11-20 ENCOUNTER — Ambulatory Visit (INDEPENDENT_AMBULATORY_CARE_PROVIDER_SITE_OTHER): Payer: Medicare Other | Admitting: Vascular Surgery

## 2018-11-20 ENCOUNTER — Encounter (INDEPENDENT_AMBULATORY_CARE_PROVIDER_SITE_OTHER): Payer: Self-pay | Admitting: Vascular Surgery

## 2018-11-20 ENCOUNTER — Ambulatory Visit (INDEPENDENT_AMBULATORY_CARE_PROVIDER_SITE_OTHER): Payer: Medicare Other

## 2018-11-20 VITALS — BP 158/67 | HR 64 | Resp 16 | Ht 63.0 in | Wt 143.2 lb

## 2018-11-20 DIAGNOSIS — I6523 Occlusion and stenosis of bilateral carotid arteries: Secondary | ICD-10-CM

## 2018-11-20 DIAGNOSIS — Z7982 Long term (current) use of aspirin: Secondary | ICD-10-CM

## 2018-11-20 DIAGNOSIS — I10 Essential (primary) hypertension: Secondary | ICD-10-CM | POA: Diagnosis not present

## 2018-11-20 DIAGNOSIS — K219 Gastro-esophageal reflux disease without esophagitis: Secondary | ICD-10-CM

## 2018-11-20 DIAGNOSIS — E782 Mixed hyperlipidemia: Secondary | ICD-10-CM

## 2018-11-23 ENCOUNTER — Encounter (INDEPENDENT_AMBULATORY_CARE_PROVIDER_SITE_OTHER): Payer: Self-pay | Admitting: Vascular Surgery

## 2018-11-23 NOTE — Progress Notes (Signed)
MRN : [id number]  Mary Pacheco is a 78 y.o. (1941-10-21) female who presents with chief complaint of  Chief Complaint  Patient presents with  . Follow-up  .  History of Present Illness:   The patient is seen for follow up evaluation of carotid stenosis. The carotid stenosis followed by ultrasound.   The patient denies amaurosis fugax. There is no recent history of TIA symptoms or focal motor deficits. There is no prior documented CVA.  The patient is taking enteric-coated aspirin 81 mg daily.  There is no history of migraine headaches. There is no history of seizures.  The patient has a history of coronary artery disease, no recent episodes of angina or shortness of breath. The patient denies PAD or claudication symptoms. There is a history of hyperlipidemia which is being treated with a statin  Duplex ultrasound of the carotid arteries done today shows 60-79% RICA and 40-98% LICA.    Current Meds  Medication Sig  . acyclovir (ZOVIRAX) 400 MG tablet TAKE 1 TABLET BY MOUTH EVERY DAY  . ALPRAZolam (XANAX) 0.25 MG tablet TAKE 1 TABLET BY MOUTH TWICE DAILY AS NEEDED FOR ANXIETY  . amLODipine (NORVASC) 2.5 MG tablet Take 1 tablet (2.5 mg total) by mouth daily.  [name] aspirin 81 MG tablet Take 81 mg by mouth daily.    [name] atorvastatin (LIPITOR) 20 MG tablet TAKE 1 TABLET BY MOUTH EVERY DAY  . cholecalciferol (VITAMIN D) 1000 UNITS tablet Take 1,000 Units by mouth daily.    . Coenzyme Q10 (EQL COQ10) 300 MG CAPS Take by mouth.  . Flaxseed, Linseed, (FLAX SEED OIL PO) Take 1,400 mg by mouth daily.  [place] Cowden Tea 150 MG CAPS Take by mouth.  . hydrochlorothiazide (MICROZIDE) 12.5 MG capsule Take 1 capsule (12.5 mg total) by mouth daily.  [name] levothyroxine (SYNTHROID, LEVOTHROID) 50 MCG tablet TAKE 1 TABLET DAILY  . lisinopril (PRINIVIL,ZESTRIL) 5 MG tablet Take 1 tablet (5 mg total) by mouth daily. (Patient taking differently: Take 2.5 mg by mouth daily. )  . Misc Natural Products (TOTAL  CARDIO HEALTH FORMULA PO) Take 1 tablet by mouth daily.    . montelukast (SINGULAIR) 10 MG tablet TAKE 1 TABLET AT BEDTIME  . Omega-3 Fatty Acids (FISH OIL TRIPLE STRENGTH) 1400 MG CAPS Take by mouth.  . pantoprazole (PROTONIX) 40 MG tablet TAKE 1 TABLET (40 MG TOTAL) BY MOUTH DAILY.  . Probiotic Product (PROBIOTIC DAILY PO) Take by mouth.    Past Medical History:  Diagnosis Date  . Anxiety 2011   patient with anxiety will continue Alprazolam prn  . Arthritis   . GERD (gastroesophageal reflux disease)    pt. with esophagea reflux. She reports great improvement with Dexilant.  [name] Hearing loss    s/p hearing aids  . 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 Hyperlipidemia    patient with HL on statins. lipids well controlled  . Hypertension     Past Surgical History:  Procedure Laterality Date  . ABDOMINAL HYSTERECTOMY  1993   multiple fibroid cysts  . cataract surgery  02/2011   bilateral  . TONSILLECTOMY      Social History Social History   Tobacco Use  . Smoking status: Never Smoker  . Smokeless tobacco: Never Used  Substance Use Topics  . Alcohol use: Yes    Comment: socially  . Drug use: No    Family History Family History  Problem Relation Age of Onset  . Heart failure Mother   .  Heart disease Mother        CABG  . Hypertension Mother   . Hyperlipidemia Mother   . Stroke Mother        mini strokes, multiple  . Heart attack Father   . Heart failure Father   . Hyperlipidemia Father   . Hypertension Father   . Heart attack Sister 79       MI    Allergies  Allergen Reactions  . Molds & Smuts   . Rosuvastatin Hives  . Simvastatin     myalgia     REVIEW OF SYSTEMS (Negative unless checked)  Constitutional: [] Weight loss  [] Fever  [] Chills Cardiac: [] Chest pain   [] Chest pressure   [] Palpitations   [] Shortness of breath when laying flat   [] Shortness of breath with exertion. Vascular:  [] Pain in legs with walking   [] Pain in legs at rest  [] History of DVT    [] Phlebitis   [] Swelling in legs   [] Varicose veins   [] Non-healing ulcers Pulmonary:   [] Uses home oxygen   [] Productive cough   [] Hemoptysis   [] Wheeze  [] COPD   [] Asthma Neurologic:  [] Dizziness   [] Seizures   [] History of stroke   [] History of TIA  [] Aphasia   [] Vissual changes   [] Weakness or numbness in arm   [] Weakness or numbness in leg Musculoskeletal:   [] Joint swelling   [] Joint pain   [] Low back pain Hematologic:  [] Easy bruising  [] Easy bleeding   [] Hypercoagulable state   [] Anemic Gastrointestinal:  [] Diarrhea   [] Vomiting  [] Gastroesophageal reflux/heartburn   [] Difficulty swallowing. Genitourinary:  [] Chronic kidney disease   [] Difficult urination  [] Frequent urination   [] Blood in urine Skin:  [] Rashes   [] Ulcers  Psychological:  [] History of anxiety   []  History of major depression.  Physical Examination  Vitals:   11/20/18 1556  BP: (!) 158/67  Pulse: 64  Resp: 16  Weight: 143 lb 3.2 oz (65 kg)  Height: 5\' 3"  (1.6 m)   Body mass index is 25.37 kg/m. Gen: WD/WN, NAD Head: [place]/AT, No temporalis wasting.  Ear/Nose/Throat: Hearing grossly intact, nares w/o erythema or drainage Eyes: PER, EOMI, sclera nonicteric.  Neck: Supple, no large masses.   Pulmonary:  Good air movement, no audible wheezing bilaterally, no use of accessory muscles.  Cardiac: RRR, no JVD Vascular: Vessel Right Left  Radial Palpable Palpable  Brachial Palpable Palpable  Carotid Palpable Palpable  Gastrointestinal: Non-distended. No guarding/no peritoneal signs.  Musculoskeletal: M/S 5/5 throughout.  No deformity or atrophy.  Neurologic: CN 2-12 intact. Symmetrical.  Speech is fluent. Motor exam as listed above. Psychiatric: Judgment intact, Mood & affect appropriate for pt's clinical situation. Dermatologic: No rashes or ulcers noted.  No changes consistent with cellulitis. Lymph : No lichenification or skin changes of chronic lymphedema.  CBC Lab Results  Component Value Date   WBC 6.5  11/11/2017   HGB 13.3 11/11/2017   HCT 39.6 11/11/2017   MCV 91.1 11/11/2017   PLT 255 11/11/2017    BMET    Component Value Date/Time   NA 138 08/25/2018 0911   NA 140 12/28/2011 1819   K 3.9 08/25/2018 0911   K 3.2 (L) 12/28/2011 1819   CL 102 08/25/2018 0911   CL 104 12/28/2011 1819   CO2 29 08/25/2018 0911   CO2 27 12/28/2011 1819   GLUCOSE 120 (H) 08/25/2018 0911   GLUCOSE 98 12/28/2011 1819   BUN 16 08/25/2018 0911   BUN 13 12/28/2011 1819   CREATININE  0.87 08/25/2018 0911   CREATININE 1.00 11/06/2013 0854   CALCIUM 9.9 08/25/2018 0911   CALCIUM 9.1 12/28/2011 1819   GFRNONAA >60 11/11/2017 1452   GFRNONAA 56 (L) 11/06/2013 0854   GFRAA >60 11/11/2017 1452   GFRAA >60 11/06/2013 0854   CrCl cannot be calculated (Patient's most recent lab result is older than the maximum 21 days allowed.).  COAG No results found for: INR, PROTIME  Radiology Mr Lumbar Spine Wo Contrast  Result Date: 11/12/2018 CLINICAL DATA:  78 year old female status post fall in December 2018 with continued low back pain. L2 compression fracture reported in 2019. Painful standing and bending. EXAM: MRI LUMBAR SPINE WITHOUT CONTRAST TECHNIQUE: Multiplanar, multisequence MR imaging of the lumbar spine was performed. No intravenous contrast was administered. COMPARISON:  Report of [facility] lumbar radiographs 11/26/2017 (no images available). Previous lumbar MRI 04/15/2012. FINDINGS: Segmentation: Lumbar segmentation appears to be normal and will be designated as such for this report. Alignment: Focal kyphosis about the T12 level. Generally stable vertebral height alignment elsewhere since 2013. Mild chronic levoconvex lumbar scoliosis. Vertebrae: L2 superior endplate compression appears chronic with roughly 50% loss of vertebral body height and no convincing residual marrow edema. There is retropulsion of the posterosuperior endplate contributing to L1-L2 spinal stenosis (see below). Mild degenerative  endplate marrow edema anteriorly at L4-L5. Normal background bone marrow signal. Intact visible sacrum and SI joints. Conus medullaris and cauda equina: Conus extends to the T12 level. No lower spinal cord or conus signal abnormality. Paraspinal and other soft tissues: Negative. Disc levels: No lower thoracic spinal stenosis. T12-L1:  Negative. L1-L2: Disc space loss with circumferential disc osteophyte complex and superimposed retropulsion of the posterosuperior endplate of L2. Mild posterior element hypertrophy. Moderate spinal stenosis (series 5, image 10). Moderate left greater than right L1 foraminal stenosis. L2-L3:  Circumferential disc bulge.  No stenosis. L3-L4: Right eccentric circumferential disc bulge and endplate spurring with mild to moderate posterior element hypertrophy. Mild to moderate spinal stenosis. Mild to moderate right lateral recess stenosis (right L4 nerve level) with moderate to severe right and mild left L3 foraminal stenosis. L4-L5: Disc space loss with circumferential disc osteophyte complex. Moderate posterior element hypertrophy. Mild to moderate spinal stenosis and right lateral recess stenosis (right L5 nerve level). Mild to moderate bilateral L4 foraminal stenosis. L5-S1: Left eccentric circumferential disc osteophyte complex with broad-based posterior component. Moderate posterior element hypertrophy. Mild to moderate spinal and left lateral recess stenosis (left S1 nerve level). Moderate to severe left and mild to moderate right L5 foraminal stenosis. IMPRESSION: 1. Moderate chronic L2 compression fracture. Retropulsion of bone contributing to moderate spinal and foraminal stenosis at L1-L2. 2. Superimposed degenerative mild-to-moderate spinal stenosis from L3-L4 through L5-S1, with moderate to severe right L3 and left L5 foraminal stenosis. 3.  No acute osseous abnormality. Electronically Signed   By: Genevie Ann M.D.   On: 11/12/2018 15:31   Vas US Carotid  Result Date:  11/20/2018 Carotid Arterial Duplex Study Indications:       Carotid artery disease. Comparison Study:  11/04/2017 Performing Technologist: Charlane Ferretti RT (R)(VS)  Examination Guidelines: A complete evaluation includes B-mode imaging, spectral Doppler, color Doppler, and power Doppler as needed of all accessible portions of each vessel. Bilateral testing is considered an integral part of a complete examination. Limited examinations for reoccurring indications may be performed as noted.  Right Carotid Findings: +----------+--------+--------+--------+--------+------------------+           PSV cm/sEDV cm/sStenosisDescribeComments           +----------+--------+--------+--------+--------+------------------+  CCA Prox  106     14                      intimal thickening +----------+--------+--------+--------+--------+------------------+ CCA Mid   103     19                      intimal thickening +----------+--------+--------+--------+--------+------------------+ CCA Distal158     29              calcificintimal thickening +----------+--------+--------+--------+--------+------------------+ ICA Prox  276     55              calcific                   +----------+--------+--------+--------+--------+------------------+ ICA Mid   202     37                                         +----------+--------+--------+--------+--------+------------------+ ICA Distal88      29                                         +----------+--------+--------+--------+--------+------------------+ ECA       152     9                                          +----------+--------+--------+--------+--------+------------------+ +----------+--------+-------+--------+-------------------+           PSV cm/sEDV cmsDescribeArm Pressure (mmHG) +----------+--------+-------+--------+-------------------+ Subclavian110                                         +----------+--------+-------+--------+-------------------+ +---------+--------+--+--------+ VertebralPSV cm/s56EDV cm/s +---------+--------+--+--------+  Left Carotid Findings: +----------+--------+--------+--------+--------+------------------+           PSV cm/sEDV cm/sStenosisDescribeComments           +----------+--------+--------+--------+--------+------------------+ CCA Prox  105     13                      intimal thickening +----------+--------+--------+--------+--------+------------------+ CCA Mid   117     21                      intimal thickening +----------+--------+--------+--------+--------+------------------+ CCA Distal118     17                      intimal thickening +----------+--------+--------+--------+--------+------------------+ ICA Prox  77      17              calcific                   +----------+--------+--------+--------+--------+------------------+ ICA Mid   123     30                                         +----------+--------+--------+--------+--------+------------------+ ICA Distal120     35                                         +----------+--------+--------+--------+--------+------------------+  ECA       135     10                                         +----------+--------+--------+--------+--------+------------------+ +----------+--------+--------+--------+-------------------+ SubclavianPSV cm/sEDV cm/sDescribeArm Pressure (mmHG) +----------+--------+--------+--------+-------------------+           214     0                                   +----------+--------+--------+--------+-------------------+ +---------+--------+--+--------+--+ VertebralPSV cm/s55EDV cm/s11 +---------+--------+--+--------+--+ Summary: Right Carotid: Velocities in the right ICA are consistent with a 60-79%                stenosis. Right ICA/CCA ratio = 2.60. Left Carotid: Velocities in the left ICA are consistent with a 40-59%  stenosis.               Left ICA/CCA ratio = 1.05. Vertebrals:  Bilateral vertebral arteries demonstrate antegrade flow. Subclavians: Normal flow hemodynamics were seen in bilateral subclavian              arteries. *See table(s) above for measurements and observations.  Electronically signed by Hortencia Pilar MD on 11/20/2018 at 5:47:12 PM.    Final      Assessment/Plan 1. Bilateral carotid artery stenosis Recommend:  Given the patient's asymptomatic subcritical stenosis no further invasive testing or surgery at this time.  Continue antiplatelet therapy as prescribed Continue management of CAD, HTN and Hyperlipidemia Healthy heart diet,  encouraged exercise at least 4 times per week Follow up in 6 months with duplex ultrasound and physical exam  - VAS US CAROTID; Future  2. Essential hypertension Continue antihypertensive medications as already ordered, these medications have been reviewed and there are no changes at this time.   3. Gastroesophageal reflux disease, esophagitis presence not specified Continue PPI as already ordered, this medication has been reviewed and there are no changes at this time.  Avoidence of caffeine and alcohol  Moderate elevation of the head of the bed   4. Mixed hyperlipidemia Continue statin as ordered and reviewed, no changes at this time     Hortencia Pilar, MD  11/23/2018 5:19 PM

## 2018-11-26 ENCOUNTER — Ambulatory Visit (INDEPENDENT_AMBULATORY_CARE_PROVIDER_SITE_OTHER): Payer: Medicare Other

## 2018-11-26 VITALS — BP 134/64 | HR 79 | Temp 98.1°F | Resp 15 | Ht 63.5 in | Wt 144.0 lb

## 2018-11-26 DIAGNOSIS — Z0000 Encounter for general adult medical examination without abnormal findings: Secondary | ICD-10-CM

## 2018-11-26 NOTE — Patient Instructions (Addendum)
  Mary Pacheco , Thank you for taking time to come for your Medicare Wellness Visit. I appreciate your ongoing commitment to your health goals. Please review the following plan we discussed and let me know if I can assist you in the future.   Follow up as needed.    Bring a copy of your [place] and/or Living Will to be scanned into chart.  Have a great day!  These are the goals we discussed: Goals      Patient Stated   . Increase physical activity (pt-stated)     Home physical therapy exercises        This is a list of the screening recommended for you and due dates:  Health Maintenance  Topic Date Due  . Mammogram  07/01/2019  . Tetanus Vaccine  04/06/2024  . Flu Shot  Completed  . DEXA scan (bone density measurement)  Completed

## 2018-11-26 NOTE — Progress Notes (Signed)
Subjective:   Mary Pacheco is a 78 y.o. female who presents for Medicare Annual (Subsequent) preventive examination.  Review of Systems:  No ROS.  Medicare Wellness Visit. Additional risk factors are reflected in the social history. Cardiac Risk Factors include: advanced age (>89men, >49 women);hypertension     Objective:     Vitals: BP 134/64 (BP Location: Left Arm, Patient Position: Sitting, Cuff Size: Normal)   Pulse 79   Temp 98.1 F (36.7 C) (Oral)   Resp 15   Ht 5' 3.5" (1.613 m)   Wt 144 lb (65.3 kg)   SpO2 97%   BMI 25.11 kg/m   Body mass index is 25.11 kg/m.  Advanced Directives 11/26/2018 11/25/2017 11/22/2016 10/29/2016 03/09/2015  Does Patient Have a Medical Advance Directive? Yes Yes Yes Yes Yes  Type of Paramedic of [place];Living will [place];Living will Living will;Healthcare Power of [place];Living will [place];Living will  Does patient want to make changes to medical advance directive? No - Patient declined No - Patient declined No - Patient declined - No - Patient declined  Copy of [place] in Chart? No - copy requested No - copy requested No - copy requested - No - copy requested    Tobacco Social History   Tobacco Use  Smoking Status Never Smoker  Smokeless Tobacco Never Used     Counseling given: Not Answered   Clinical Intake:  Pre-visit preparation completed: Yes  Pain : No/denies pain     Diabetes: No  How often do you need to have someone help you when you read instructions, pamphlets, or other written materials from your doctor or pharmacy?: 1 - Never        Past Medical History:  Diagnosis Date  . Anxiety 2011   patient with anxiety will continue Alprazolam prn  . Arthritis   . GERD (gastroesophageal reflux disease)    pt. with esophagea reflux. She reports great improvement with Dexilant.  [name] Hearing loss    s/p  hearing aids  . 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 Hyperlipidemia    patient with HL on statins. lipids well controlled  . Hypertension    Past Surgical History:  Procedure Laterality Date  . ABDOMINAL HYSTERECTOMY  1993   multiple fibroid cysts  . cataract surgery  02/2011   bilateral  . TONSILLECTOMY     Family History  Problem Relation Age of Onset  . Heart failure Mother   . Heart disease Mother        CABG  . Hypertension Mother   . Hyperlipidemia Mother   . Stroke Mother        mini strokes, multiple  . Heart attack Father   . Heart failure Father   . Hyperlipidemia Father   . Hypertension Father   . Heart attack Sister 15       MI  . Hernia Sister   . Prostate cancer Brother    Social History   Socioeconomic History  . Marital status: Married    Spouse name: Not on file  . Number of children: Not on file  . Years of education: Not on file  . Highest education level: Not on file  Occupational History  . Not on file  Social Needs  . Financial resource strain: Not on file  . Food insecurity:    Worry: Never true    Inability: Never true  . Transportation  needs:    Medical: No    Non-medical: No  Tobacco Use  . Smoking status: Never Smoker  . Smokeless tobacco: Never Used  Substance and Sexual Activity  . Alcohol use: Yes    Comment: socially  . Drug use: No  . Sexual activity: Yes    Birth control/protection: Post-menopausal  Lifestyle  . Physical activity:    Days per week: Not on file    Minutes per session: Not on file  . Stress: Not at all  Relationships  . Social connections:    Talks on phone: Not on file    Gets together: Not on file    Attends religious service: Not on file    Active member of club or organization: Not on file    Attends meetings of clubs or organizations: Not on file    Relationship status: Not on file  Other Topics Concern  . Not on file  Social History Narrative   From [place] origin[place]      Retired Surveyor, minerals      married    Outpatient Encounter Medications as of 11/26/2018  Medication Sig  . acyclovir (ZOVIRAX) 400 MG tablet TAKE 1 TABLET BY MOUTH EVERY DAY  . ALPRAZolam (XANAX) 0.25 MG tablet TAKE 1 TABLET BY MOUTH TWICE DAILY AS NEEDED FOR ANXIETY  . amLODipine (NORVASC) 2.5 MG tablet Take 1 tablet (2.5 mg total) by mouth daily.  [name] aspirin 81 MG tablet Take 81 mg by mouth daily.    [name] atorvastatin (LIPITOR) 20 MG tablet TAKE 1 TABLET BY MOUTH EVERY DAY  . cholecalciferol (VITAMIN D) 1000 UNITS tablet Take 1,000 Units by mouth daily.    . Coenzyme Q10 (EQL COQ10) 300 MG CAPS Take by mouth.  . Flaxseed, Linseed, (FLAX SEED OIL PO) Take 1,400 mg by mouth daily.  Nyoka Cowden Tea 150 MG CAPS Take by mouth.  . hydrochlorothiazide (MICROZIDE) 12.5 MG capsule Take 1 capsule (12.5 mg total) by mouth daily.  [name] levothyroxine (SYNTHROID, LEVOTHROID) 50 MCG tablet TAKE 1 TABLET DAILY  . lisinopril (PRINIVIL,ZESTRIL) 5 MG tablet Take 1 tablet (5 mg total) by mouth daily. (Patient taking differently: Take 2.5 mg by mouth daily. )  . Misc Natural Products (TOTAL CARDIO HEALTH FORMULA PO) Take 1 tablet by mouth daily.    . montelukast (SINGULAIR) 10 MG tablet TAKE 1 TABLET AT BEDTIME  . Omega-3 Fatty Acids (FISH OIL TRIPLE STRENGTH) 1400 MG CAPS Take by mouth.  . pantoprazole (PROTONIX) 40 MG tablet TAKE 1 TABLET (40 MG TOTAL) BY MOUTH DAILY.  . Probiotic Product (PROBIOTIC DAILY PO) Take by mouth.   No facility-administered encounter medications on file as of 11/26/2018.     Activities of Daily Living In your present state of health, do you have any difficulty performing the following activities: 11/26/2018  Hearing? Y  Comment Hearing aids  Vision? N  Difficulty concentrating or making decisions? N  Walking or climbing stairs? N  Dressing or bathing? N  Doing errands, shopping? N  Preparing Food and eating ? N  Using the Toilet? N  In the past six months, have you accidently leaked urine? N  Do  you have problems with loss of bowel control? N  Managing your Medications? N  Managing your Finances? N  Housekeeping or managing your Housekeeping? N  Some recent data might be hidden    Patient Care Team: Burnard Hawthorne, FNP as PCP - General (Family Medicine) Rockey Situ Kathlene November, MD as Consulting Physician (  Cardiology) Schnier, Dolores Lory, MD (Vascular Surgery) Margaretha Sheffield, MD (Otolaryngology)    Assessment:   This is a routine wellness examination for Jizel.  Health Screenings  Mammogram -06/30/18 Colonoscopy -05/18/14 Bone Density -06/04/16 Glaucoma -none Hearing -hearing aids Hemoglobin A1C -08/25/18 (6.0) Cholesterol -08/25/18 (169) Dental- every 4 months Vision- every 12 months  Social  Alcohol intake yes, 1 per day Smoking history- never Smokers in home? none Illicit drug use? none Exercise -water physical therapy Diet -regular  Sexually Active -yes Multiple Partners -no  Safety  Patient feels safe at home.  Patient does have smoke detectors at home  Patient does wear sunscreen or protective clothing when in direct sunlight  Patient does wear seat belt when driving or riding with others.   Activities of Daily Living Patient can do their own household chores. Denies needing assistance with: driving, feeding themselves, getting from bed to chair, getting to the toilet, bathing/showering, dressing, managing money, climbing flight of stairs, or preparing meals.   Depression Screen Patient denies losing interest in daily life, feeling hopeless, or crying easily over simple problems.   Fall Screen Patient denies being afraid of falling or falling in the last year.   Memory Screen Patient denies problems with memory, misplacing items, and is able to balance checkbook/bank accounts.  Patient is alert, normal appearance, oriented to person/place/and time. Correctly identified the president of the [place], recall of 3/3 objects, and performing simple calculations.    Patient displays appropriate judgement and can read correct time from watch face.   Immunizations The following Immunizations are up to date: Influenza, shingles, pneumonia, and tetanus.   Other Providers Patient Care Team: Burnard Hawthorne, FNP as PCP - General (Family Medicine) Minna Merritts, MD as Consulting Physician (Cardiology) Schnier, Dolores Lory, MD (Vascular Surgery) Margaretha Sheffield, MD (Otolaryngology)  Exercise Activities and Dietary recommendations Current Exercise Habits: The patient does not participate in regular exercise at present  Goals      Patient Stated   . Increase physical activity (pt-stated)     Home physical therapy exercises        Fall Risk Fall Risk  11/26/2018 11/25/2017 11/22/2016 11/22/2016 05/22/2016  Falls in the past year? 0 Yes No No No  Number falls in past yr: - 1 - - -  Injury with Fall? - No - - -  Comment - Slipped on the pavement during winter weather  - - -   Depression Screen PHQ 2/9 Scores 11/26/2018 11/25/2017 11/22/2016 11/22/2016  PHQ - 2 Score 0 0 0 0     Cognitive Function MMSE - Mini Mental State Exam 11/25/2017 11/22/2016 03/09/2015  Orientation to time 5 5 5   Orientation to Place 5 5 5   Registration 3 3 3   Attention/ Calculation 5 5 5   Recall 2 3 3   Language- name 2 objects 2 2 2   Language- repeat 1 1 1   Language- follow 3 step command 3 3 3   Language- read & follow direction 1 1 1   Write a sentence 1 1 1   Copy design 1 1 1   Total score 29 30 30      6CIT Screen 11/26/2018  What Year? 0 points  What month? 0 points  What time? 0 points  Count back from 20 0 points  Months in reverse 2 points  Repeat phrase 0 points  Total Score 2    Immunization History  Administered Date(s) Administered  . Influenza Split 07/12/2011, 09/15/2012, 08/06/2014  . Influenza, High Dose Seasonal PF  07/18/2016, 07/15/2017, 08/20/2018  . Influenza-Unspecified 07/31/2013, 07/15/2017  . Pneumococcal Conjugate-13 04/06/2014  . Pneumococcal  Polysaccharide-23 03/20/2009  . Tdap 04/06/2014  . Zoster 07/25/2013  . Zoster Recombinat (Shingrix) 04/03/2018, 08/07/2018   Screening Tests Health Maintenance  Topic Date Due  . MAMMOGRAM  07/01/2019  . TETANUS/TDAP  04/06/2024  . INFLUENZA VACCINE  Completed  . DEXA SCAN  Completed       Plan:    End of life planning; Advance aging; Advanced directives discussed. Copy of current HCPOA/Living Will requested.    I have personally reviewed and noted the following in the patient's chart:   . Medical and social history . Use of alcohol, tobacco or illicit drugs  . Current medications and supplements . Functional ability and status . Nutritional status . Physical activity . Advanced directives . List of other physicians . Hospitalizations, surgeries, and ER visits in previous 12 months . Vitals . Screenings to include cognitive, depression, and falls . Referrals and appointments  In addition, I have reviewed and discussed with patient certain preventive protocols, quality metrics, and best practice recommendations. A written personalized care plan for preventive services as well as general preventive health recommendations were provided to patient.     Varney Biles, LPN  11/30/209   Agree with plan. She has f/u with me April 2020.  Mable Paris, NP

## 2019-01-08 ENCOUNTER — Telehealth: Payer: Self-pay

## 2019-01-08 NOTE — Telephone Encounter (Signed)
Call to patient to reschedule in regards to COVID-19 restrictions.  ?  Pt is comfortable with rescheduling at this time and denies any new, urgent or worsening sx.  ?  COVID-19 Pre-Screening:  1. Have you been in contact with someone who was sick? no 2. Do you have any of the following symptoms (cough, fever, muscle pain, vomiting, diarrhea, weakness abdominal pain, rash, red eye, bruising or bleeding, joint pain, severe headache)?  no 3. Have you travelled internationally or out of state in the last month?  no 4. Do you need any refills at this time?  no  Patient aware of the following: Please be advised that we require, no one but yourself to come to appointment. If necessary, only one visitor may come with you into the building. They will also be asked the same screening questions. You will be contacted at a later time to reschedule. However, this will depend on ongoing evaluation of the Covid-19 situation.  Please call us if any new questions or concerns arise. We are here for advice.  Routing to COVID cancel pool.

## 2019-01-12 ENCOUNTER — Ambulatory Visit: Payer: Medicare Other | Admitting: Cardiovascular Disease

## 2019-01-14 NOTE — Telephone Encounter (Signed)
No ans no vm   °

## 2019-01-16 NOTE — Telephone Encounter (Signed)
Lm with Spouse to call office for Evisit.

## 2019-01-19 NOTE — Telephone Encounter (Signed)
Virtual Visit Pre-Appointment Phone Call  Steps For Call:  1. Confirm consent - "In the setting of the current Covid19 crisis, you are scheduled for a (phone or video) visit with your provider on (date) at (time).  Just as we do with many in-office visits, in order for you to participate in this visit, we must obtain consent.  If you'd like, I can send this to your mychart (if signed up) or email for you to review.  Otherwise, I can obtain your verbal consent now.  All virtual visits are billed to your insurance company just like a normal visit would be.  By agreeing to a virtual visit, we'd like you to understand that the technology does not allow for your provider to perform an examination, and thus may limit your provider's ability to fully assess your condition.  Finally, though the technology is pretty good, we cannot assure that it will always work on either your or our end, and in the setting of a video visit, we may have to convert it to a phone-only visit.  In either situation, we cannot ensure that we have a secure connection.  Are you willing to proceed?"  2. Give patient instructions for WebEx download to smartphone as below if video visit  3. Advise patient to be prepared with any vital sign or heart rhythm information, their current medicines, and a piece of paper and pen handy for any instructions they may receive the day of their visit  4. Inform patient they will receive a phone call 15 minutes prior to their appointment time (may be from unknown caller ID) so they should be prepared to answer  5. Confirm that appointment type is correct in Epic appointment notes (video vs telephone)    TELEPHONE CALL NOTE  Mary Pacheco has been deemed a candidate for a follow-up tele-health visit to limit community exposure during the Covid-19 pandemic. I spoke with the patient via phone to ensure availability of phone/video source, confirm preferred email & phone number, and discuss  instructions and expectations.  I reminded Mary Pacheco to be prepared with any vital sign and/or heart rhythm information that could potentially be obtained via home monitoring, at the time of her visit. I reminded Mary Pacheco to expect a phone call at the time of her visit if her visit.  Did the patient verbally acknowledge consent to treatment? YES  Clarisse Gouge 01/19/2019 10:59 AM   DOWNLOADING THE WEBEX SOFTWARE TO SMARTPHONE  - If Apple, go to App Store and type in WebEx in the search bar. [place] [employer], the blue/green circle. The app is free but as with any other app downloads, their phone may require them to verify saved payment information or Apple password. The patient does NOT have to create an account.  - If Android, ask patient to go to [employer] and type in WebEx in the search bar. [place] [employer], the blue/green circle. The app is free but as with any other app downloads, their phone may require them to verify saved payment information or Android password. The patient does NOT have to create an account.   CONSENT FOR TELE-HEALTH VISIT - PLEASE REVIEW  I hereby voluntarily request, consent and authorize [facility] and its employed or contracted physicians, physician assistants, nurse practitioners or other licensed health care professionals (the Practitioner), to provide me with telemedicine health care services (the Services") as deemed necessary by the treating Practitioner. I  acknowledge and consent to receive the Services by the Practitioner via telemedicine. I understand that the telemedicine visit will involve communicating with the Practitioner through live audiovisual communication technology and the disclosure of certain medical information by electronic transmission. I acknowledge that I have been given the opportunity to request an in-person assessment or other available alternative prior to the telemedicine visit and am  voluntarily participating in the telemedicine visit.  I understand that I have the right to withhold or withdraw my consent to the use of telemedicine in the course of my care at any time, without affecting my right to future care or treatment, and that the Practitioner or I may terminate the telemedicine visit at any time. I understand that I have the right to inspect all information obtained and/or recorded in the course of the telemedicine visit and may receive copies of available information for a reasonable fee.  I understand that some of the potential risks of receiving the Services via telemedicine include:   Delay or interruption in medical evaluation due to technological equipment failure or disruption;  Information transmitted may not be sufficient (e.g. poor resolution of images) to allow for appropriate medical decision making by the Practitioner; and/or   In rare instances, security protocols could fail, causing a breach of personal health information.  Furthermore, I acknowledge that it is my responsibility to provide information about my medical history, conditions and care that is complete and accurate to the best of my ability. I acknowledge that Practitioner's advice, recommendations, and/or decision may be based on factors not within their control, such as incomplete or inaccurate data provided by me or distortions of diagnostic images or specimens that may result from electronic transmissions. I understand that the practice of medicine is not an exact science and that Practitioner makes no warranties or guarantees regarding treatment outcomes. I acknowledge that I will receive a copy of this consent concurrently upon execution via email to the email address I last provided but may also request a printed copy by calling the office of [facility].    I understand that my insurance will be billed for this visit.   I have read or had this consent read to me.  I understand the  contents of this consent, which adequately explains the benefits and risks of the Services being provided via telemedicine.   I have been provided ample opportunity to ask questions regarding this consent and the Services and have had my questions answered to my satisfaction.  I give my informed consent for the services to be provided through the use of telemedicine in my medical care  By participating in this telemedicine visit I agree to the above.

## 2019-01-19 NOTE — Telephone Encounter (Signed)
Scheduled

## 2019-01-20 ENCOUNTER — Telehealth (INDEPENDENT_AMBULATORY_CARE_PROVIDER_SITE_OTHER): Payer: Medicare Other | Admitting: Cardiovascular Disease

## 2019-01-20 ENCOUNTER — Other Ambulatory Visit: Payer: Self-pay

## 2019-01-20 DIAGNOSIS — E782 Mixed hyperlipidemia: Secondary | ICD-10-CM

## 2019-01-20 DIAGNOSIS — R7303 Prediabetes: Secondary | ICD-10-CM

## 2019-01-20 DIAGNOSIS — I739 Peripheral vascular disease, unspecified: Secondary | ICD-10-CM | POA: Insufficient documentation

## 2019-01-20 DIAGNOSIS — I6523 Occlusion and stenosis of bilateral carotid arteries: Secondary | ICD-10-CM | POA: Diagnosis not present

## 2019-01-20 DIAGNOSIS — I10 Essential (primary) hypertension: Secondary | ICD-10-CM | POA: Diagnosis not present

## 2019-01-20 NOTE — Patient Instructions (Signed)

## 2019-01-20 NOTE — Progress Notes (Addendum)
Virtual Visit via Video Note   This visit type was conducted due to national recommendations for restrictions regarding the COVID-19 Pandemic (e.g. social distancing) in an effort to limit this patient's exposure and mitigate transmission in our community.  Due to her co-morbid illnesses, this patient is at least at moderate risk for complications without adequate follow up.  This format is felt to be most appropriate for this patient at this time.  All issues noted in this document were discussed and addressed.  A limited physical exam was performed with this format.  Please refer to the patient's chart for her consent to telehealth for [facility].     Date:  01/20/2019   ID:  Mary Pacheco, DOB 1940-10-28, MRN [id number]  Patient Location:  [place] [address]   Provider location:   [facility], [place] office  PCP:  Burnard Hawthorne, FNP  Cardiologist:  No primary care provider on file.   Chief Complaint:  Carotid disease, hyperlipidemia  WEBcam visit  History of Present Illness:    Mary Pacheco is a 78 y.o. female who presents via audio/video conferencing for a telehealth visit today.   The patient does not symptoms concerning for COVID-19 infection (fever, chills, cough, or new SHORTNESS OF BREATH).  Please refer to prior office visit for complete details: Patient has a past medical history of  hypertension,  hyperlipidemia,  bilateral carotid arterial disease,  estimated at 50-60% on the right, <39% on the left, in 10/2016 anxiety  presenting for routine followup of her PAD/carotid disease, hyperlipidemia  GERD Symptoms, periodically flaring up  Previous CT coronary calcium score of 5  Duplex ultrasound of the carotid arteries done today shows 60-79% RICA and 67-12% LICA.  Followed by vein and vascular Prior carotid ultrasounds discussed with her in detail, slight increase in disease on the right  Most recent lipid panel LDL 75,  total cholesterol 169 Takes Lipitor 20 daily with aspirin Previously unable to tolerate 40 mg, pain in her hands  HBA1C 6.0  Arms aching, both, then went away, etiology unclear Possibly from the statin Also reported having occasional cramping in her chest " Felt like a cramp"  this too has resolved  120 to 134/63 today In general runs within a good range   Prior CV studies:   The following studies were reviewed today:  No recent echocardiogram available  Past Medical History:  Diagnosis Date  . Anxiety 2011   patient with anxiety will continue Alprazolam prn  . Arthritis   . GERD (gastroesophageal reflux disease)    pt. with esophagea reflux. She reports great improvement with Dexilant.  [name] Hearing loss    s/p hearing aids  . 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 Hyperlipidemia    patient with HL on statins. lipids well controlled  . Hypertension    Past Surgical History:  Procedure Laterality Date  . ABDOMINAL HYSTERECTOMY  1993   multiple fibroid cysts  . cataract surgery  02/2011   bilateral  . TONSILLECTOMY       Current Meds  Medication Sig  . acyclovir (ZOVIRAX) 400 MG tablet TAKE 1 TABLET BY MOUTH EVERY DAY  . ALPRAZolam (XANAX) 0.25 MG tablet TAKE 1 TABLET BY MOUTH TWICE DAILY AS NEEDED FOR ANXIETY  . amLODipine (NORVASC) 2.5 MG tablet Take 1 tablet (2.5 mg total) by mouth daily.  [name] aspirin 81 MG tablet Take 81 mg by mouth daily.    [name]  atorvastatin (LIPITOR) 20 MG tablet TAKE 1 TABLET BY MOUTH EVERY DAY  . cholecalciferol (VITAMIN D) 1000 UNITS tablet Take 1,000 Units by mouth daily.    . Coenzyme Q10 (EQL COQ10) 300 MG CAPS Take by mouth.  . Flaxseed, Linseed, (FLAX SEED OIL PO) Take 1,400 mg by mouth daily.  Nyoka Cowden Tea 150 MG CAPS Take by mouth.  . hydrochlorothiazide (MICROZIDE) 12.5 MG capsule Take 1 capsule (12.5 mg total) by mouth daily.  [name] levothyroxine (SYNTHROID, LEVOTHROID) 50 MCG tablet TAKE 1 TABLET DAILY  . lisinopril (PRINIVIL,ZESTRIL) 5 MG tablet  Take 1 tablet (5 mg total) by mouth daily. (Patient taking differently: Take 2.5 mg by mouth daily. )  . Misc Natural Products (TOTAL CARDIO HEALTH FORMULA PO) Take 1 tablet by mouth daily.    . montelukast (SINGULAIR) 10 MG tablet TAKE 1 TABLET AT BEDTIME  . Omega-3 Fatty Acids (FISH OIL TRIPLE STRENGTH) 1400 MG CAPS Take by mouth.  . pantoprazole (PROTONIX) 40 MG tablet TAKE 1 TABLET (40 MG TOTAL) BY MOUTH DAILY.  . Probiotic Product (PROBIOTIC DAILY PO) Take by mouth.     Allergies:   Molds & smuts; Rosuvastatin; and Simvastatin   Social History   Tobacco Use  . Smoking status: Never Smoker  . Smokeless tobacco: Never Used  Substance Use Topics  . Alcohol use: Yes    Comment: socially  . Drug use: No     Current Outpatient Medications on File Prior to Visit  Medication Sig Dispense Refill  . acyclovir (ZOVIRAX) 400 MG tablet TAKE 1 TABLET BY MOUTH EVERY DAY 90 tablet 2  . ALPRAZolam (XANAX) 0.25 MG tablet TAKE 1 TABLET BY MOUTH TWICE DAILY AS NEEDED FOR ANXIETY 30 tablet 1  . amLODipine (NORVASC) 2.5 MG tablet Take 1 tablet (2.5 mg total) by mouth daily. 90 tablet 1  . aspirin 81 MG tablet Take 81 mg by mouth daily.      [name] atorvastatin (LIPITOR) 20 MG tablet TAKE 1 TABLET BY MOUTH EVERY DAY 90 tablet 1  . cholecalciferol (VITAMIN D) 1000 UNITS tablet Take 1,000 Units by mouth daily.      . Coenzyme Q10 (EQL COQ10) 300 MG CAPS Take by mouth.    . Flaxseed, Linseed, (FLAX SEED OIL PO) Take 1,400 mg by mouth daily.    Nyoka Cowden Tea 150 MG CAPS Take by mouth.    . hydrochlorothiazide (MICROZIDE) 12.5 MG capsule Take 1 capsule (12.5 mg total) by mouth daily. 90 capsule 3  . levothyroxine (SYNTHROID, LEVOTHROID) 50 MCG tablet TAKE 1 TABLET DAILY 90 tablet 4  . lisinopril (PRINIVIL,ZESTRIL) 5 MG tablet Take 1 tablet (5 mg total) by mouth daily. (Patient taking differently: Take 2.5 mg by mouth daily. ) 90 tablet 3  . Misc Natural Products (TOTAL CARDIO HEALTH FORMULA PO) Take 1 tablet by  mouth daily.      . montelukast (SINGULAIR) 10 MG tablet TAKE 1 TABLET AT BEDTIME 90 tablet 1  . Omega-3 Fatty Acids (FISH OIL TRIPLE STRENGTH) 1400 MG CAPS Take by mouth.    . pantoprazole (PROTONIX) 40 MG tablet TAKE 1 TABLET (40 MG TOTAL) BY MOUTH DAILY. 90 tablet 0  . Probiotic Product (PROBIOTIC DAILY PO) Take by mouth.     No current facility-administered medications on file prior to visit.      Family Hx: The patient's family history includes Heart attack in her father; Heart attack (age of onset: 45) in her sister; Heart disease in her mother; Heart failure  in her father and mother; Hernia in her sister; Hyperlipidemia in her father and mother; Hypertension in her father and mother; Prostate cancer in her brother; Stroke in her mother.  ROS:   Please see the history of present illness.    Review of Systems  Constitutional: Negative.   Respiratory: Negative.   Cardiovascular: Positive for chest pain.  Gastrointestinal: Negative.   Musculoskeletal: Positive for myalgias.  Neurological: Negative.   Psychiatric/Behavioral: Negative.   All other systems reviewed and are negative.     Labs/Other Tests and Data Reviewed:    Recent Labs: 08/25/2018: ALT 20; BUN 16; Creatinine, Ser 0.87; Potassium 3.9; Sodium 138; TSH 2.90   Recent Lipid Panel Lab Results  Component Value Date/Time   CHOL 169 08/25/2018 09:11 AM   TRIG 73.0 08/25/2018 09:11 AM   HDL 79.40 08/25/2018 09:11 AM   CHOLHDL 2 08/25/2018 09:11 AM   LDLCALC 75 08/25/2018 09:11 AM    Wt Readings from Last 3 Encounters:  11/26/18 144 lb (65.3 kg)  11/20/18 143 lb 3.2 oz (65 kg)  09/23/18 144 lb (65.3 kg)     Exam:    Vital Signs:  There were no vitals taken for this visit.   Well nourished, well developed female in no acute distress.   ASSESSMENT & PLAN:    PAD (peripheral artery disease) ([place]) Long review of her carotid disease, minimal progression over the past several years Stressed importance of  weight loss, aggressive lipid panel, close monitoring of her sugars  Bilateral carotid artery stenosis 60% on the right, slightly better on the left No dramatic change over the past several years Followed by vein and vascular  Mixed hyperlipidemia LDL slightly above goal, she is not particularly interested in Zetia at this time She will call back if she would like to start the medication Previously unable to tolerate higher dose Lipitor  Essential hypertension Blood pressure is well controlled on today's visit. No changes made to the medications.  Prediabetes We have encouraged continued exercise, careful diet management in an effort to lose weight.    COVID-19 Education: The signs and symptoms of COVID-19 were discussed with the patient and how to seek care for testing (follow up with PCP or arrange E-visit).  The importance of social distancing was discussed today.  Patient Risk:   After full review of this patients clinical status, I feel that they are at least moderate risk at this time.  Time:   Today, I have spent 25 minutes with the patient with telehealth technology discussing PAD, carotid disease, goals for her cholesterol and blood pressure, medication options for hyperlipidemia.     Medication Adjustments/Labs and Tests Ordered: Current medicines are reviewed at length with the patient today.  Concerns regarding medicines are outlined above.   Tests Ordered: No tests ordered   Medication Changes: No changes made   Disposition: Follow-up in 6 months   Signed, Ida Rogue, MD  01/20/2019 4:52 PM    [place] [facility] [address], [place], [place] [address]

## 2019-01-21 ENCOUNTER — Other Ambulatory Visit: Payer: Self-pay

## 2019-01-21 ENCOUNTER — Telehealth: Payer: Self-pay | Admitting: Family

## 2019-01-21 DIAGNOSIS — I10 Essential (primary) hypertension: Secondary | ICD-10-CM

## 2019-01-21 MED ORDER — LISINOPRIL 5 MG PO TABS: 5.0000 mg | ORAL_TABLET | Freq: Every day | ORAL | 3 refills | Status: DC

## 2019-01-21 NOTE — Telephone Encounter (Signed)
Copied from [place] [id number]. Topic: Quick Communication - Rx Refill/Question >> Jan 21, 2019  1:13 PM Waylan Rocher, Lumin L wrote: Medication: lisinopril (PRINIVIL,ZESTRIL) 5 MG tablet  Has the patient contacted their pharmacy? Yes Agent: If no, request that the patient contact the pharmacy for the refill.) (Agent: If yes, when and what did the pharmacy advise?)  Preferred Pharmacy (with phone number or street name): [id number] - [place], [place] [address]  Phone: [phone] Fax: [phone]  Agent: Please be advised that RX refills may take up to 3 business days. We ask that you follow-up with your pharmacy.

## 2019-01-21 NOTE — Telephone Encounter (Signed)
Prescription sent

## 2019-01-26 ENCOUNTER — Encounter: Payer: Self-pay | Admitting: Family

## 2019-01-26 ENCOUNTER — Ambulatory Visit (INDEPENDENT_AMBULATORY_CARE_PROVIDER_SITE_OTHER): Payer: Medicare Other | Admitting: Family

## 2019-01-26 DIAGNOSIS — I10 Essential (primary) hypertension: Secondary | ICD-10-CM

## 2019-01-26 DIAGNOSIS — M25471 Effusion, right ankle: Secondary | ICD-10-CM | POA: Insufficient documentation

## 2019-01-26 NOTE — Progress Notes (Signed)
Patient scheduled & virtual visit invite sent.

## 2019-01-26 NOTE — Progress Notes (Signed)
This visit type was conducted due to national recommendations for restrictions regarding the COVID-19 pandemic (e.g. social distancing).  This format is felt to be most appropriate for this patient at this time.  All issues noted in this document were discussed and addressed.  No physical exam was performed (except for noted visual exam findings with Video Visits). Virtual Visit via Video Note  I connected with@ on 01/26/19 at 11:00 AM EDT by a video enabled telemedicine application and verified that I am speaking with the correct person using two identifiers.  Location patient: home Location provider:work Persons participating in the virtual visit: patient, provider  I discussed the limitations of evaluation and management by telemedicine and the availability of in person appointments. The patient expressed understanding and agreed to proceed.   HPI:  Right ankle swelling at 'ankle bone' x one week,  Resolved. Looks 'normal today. '  No swelling on top of right foot. Started one week ago after puncture wound with scissors 6 inches above right ankle.  No recent surgery.  No history of cancer. Notes tenderness in right calf 'muscle' which has improved.  Had actually "forgotten" about injury when she woke up this morning as pain had resolved.  Has been applying neosporin. Normal level of activity. No redness, purulent discharge, increased heat. Some swelling at the end of the day, resolves after sleep.  Calf sizes appear symmetric. No SOB, CP  Tdap UTD, within in 5 years. No h/o DVT.   HTN-BP today was 120/ 57, 120/50, 119/54. On lisinopril 2.5 and amlodipine 2.5mg  daily. On HCTZ n12.5mg . Denies exertional chest pain or pressure, numbness or tingling radiating to left arm or jaw, palpitations, dizziness, frequent headaches, changes in vision, or shortness of breath.    following with Dr Rockey Situ; last visit 01/20/19.   ROS: See pertinent positives and negatives per HPI.  Past Medical History:   Diagnosis Date  . Anxiety 2011   patient with anxiety will continue Alprazolam prn  . Arthritis   . GERD (gastroesophageal reflux disease)    pt. with esophagea reflux. She reports great improvement with Dexilant.  [name] Hearing loss    s/p hearing aids  . 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 Hyperlipidemia    patient with HL on statins. lipids well controlled  . Hypertension     Past Surgical History:  Procedure Laterality Date  . ABDOMINAL HYSTERECTOMY  1993   multiple fibroid cysts  . cataract surgery  02/2011   bilateral  . TONSILLECTOMY      Family History  Problem Relation Age of Onset  . Heart failure Mother   . Heart disease Mother        CABG  . Hypertension Mother   . Hyperlipidemia Mother   . Stroke Mother        mini strokes, multiple  . Heart attack Father   . Heart failure Father   . Hyperlipidemia Father   . Hypertension Father   . Heart attack Sister 42       MI  . Hernia Sister   . Prostate cancer Brother     SOCIAL HX: Never smoker   Current Outpatient Medications:  .  acyclovir (ZOVIRAX) 400 MG tablet, TAKE 1 TABLET BY MOUTH EVERY DAY, Disp: 90 tablet, Rfl: 2 .  ALPRAZolam (XANAX) 0.25 MG tablet, TAKE 1 TABLET BY MOUTH TWICE DAILY AS NEEDED FOR ANXIETY, Disp: 30 tablet, Rfl: 1 .  amLODipine (NORVASC) 2.5 MG tablet, Take 1 tablet (2.5 mg total) by  mouth daily., Disp: 90 tablet, Rfl: 1 .  aspirin 81 MG tablet, Take 81 mg by mouth daily.  , Disp: , Rfl:  .  atorvastatin (LIPITOR) 20 MG tablet, TAKE 1 TABLET BY MOUTH EVERY DAY, Disp: 90 tablet, Rfl: 1 .  cholecalciferol (VITAMIN D) 1000 UNITS tablet, Take 1,000 Units by mouth daily.  , Disp: , Rfl:  .  Coenzyme Q10 (EQL COQ10) 300 MG CAPS, Take by mouth., Disp: , Rfl:  .  Flaxseed, Linseed, (FLAX SEED OIL PO), Take 1,400 mg by mouth daily., Disp: , Rfl:  .  Green Tea 150 MG CAPS, Take by mouth., Disp: , Rfl:  .  hydrochlorothiazide (MICROZIDE) 12.5 MG capsule, Take 1 capsule (12.5 mg total) by mouth daily.,  Disp: 90 capsule, Rfl: 3 .  levothyroxine (SYNTHROID, LEVOTHROID) 50 MCG tablet, TAKE 1 TABLET DAILY, Disp: 90 tablet, Rfl: 4 .  lisinopril (PRINIVIL,ZESTRIL) 5 MG tablet, Take 1 tablet (5 mg total) by mouth daily. (Patient taking differently: Take 2.5 mg by mouth daily. ), Disp: 90 tablet, Rfl: 3 .  Misc Natural Products (TOTAL CARDIO HEALTH FORMULA PO), Take 1 tablet by mouth daily.  , Disp: , Rfl:  .  Omega-3 Fatty Acids (FISH OIL TRIPLE STRENGTH) 1400 MG CAPS, Take by mouth., Disp: , Rfl:  .  pantoprazole (PROTONIX) 40 MG tablet, TAKE 1 TABLET (40 MG TOTAL) BY MOUTH DAILY., Disp: 90 tablet, Rfl: 0 .  Probiotic Product (PROBIOTIC DAILY PO), Take by mouth., Disp: , Rfl:  .  montelukast (SINGULAIR) 10 MG tablet, TAKE 1 TABLET AT BEDTIME (Patient not taking: Reported on 01/26/2019), Disp: 90 tablet, Rfl: 1  EXAM:  VITALS per patient if applicable:  GENERAL: alert, oriented, appears well and in no acute distress  HEENT: atraumatic, conjunttiva clear, no obvious abnormalities on inspection of external nose and ears  NECK: normal movements of the head and neck  LUNGS: on inspection no signs of respiratory distress, breathing rate appears normal, no obvious gross SOB, gasping or wheezing  CV: no obvious cyanosis  MS: moves all visible extremities without noticeable abnormality  PSYCH/NEURO: pleasant and cooperative, no obvious depression or anxiety, speech and thought processing grossly intact  Extremities: Patient panned video down to her right ankle, unable to assess for swelling. Skin intact.  No erythema.    ASSESSMENT AND PLAN:  Discussed the following assessment and plan:  Right ankle swelling  Essential hypertension Problem List Items Addressed This Visit      Cardiovascular and Mediastinum   HTN (hypertension)    Diastolic blood pressure continues to be around the low side.  Fortunately patient is asymptomatic.  We will do a trial stop of the lisinopril and see if diastolic  blood pressure rebounds while systolic remains in adequate control.  Close follow-up in 1 week.        Other   Right ankle swelling - Primary    Resolved at this time.  Unable to adequately assess  via WebEx.  Discussed with patient risk of DVT with unilateral swelling.  She emphasized the swelling is better on her "ankle bone".  She states her calves appear symmetric.  Low risk by wells criteria. She will call me back with measurements of her calf today.  She denies further redness, heat.  Close vigilance advised.  Tdap up-to-date.            I discussed the assessment and treatment plan with the patient. The patient was provided an opportunity to ask questions and all  were answered. The patient agreed with the plan and demonstrated an understanding of the instructions.   The patient was advised to call back or seek an in-person evaluation if the symptoms worsen or if the condition fails to improve as anticipated.     Mary Paris, FNP

## 2019-01-26 NOTE — Assessment & Plan Note (Signed)
Resolved at this time.  Unable to adequately assess  via WebEx.  Discussed with patient risk of DVT with unilateral swelling.  She emphasized the swelling is better on her "ankle bone".  She states her calves appear symmetric.  Low risk by wells criteria. She will call me back with measurements of her calf today.  She denies further redness, heat.  Close vigilance advised.  Tdap up-to-date.

## 2019-01-26 NOTE — Patient Instructions (Addendum)
Please watch right ankle and if swelling were to increase or you notice warm, heat, or increase calf size.   Please measure your calves and call me with the measurements.   Trial STOP lisinopril and see what your blood pressure looks like. Would like your bottom number to more consistently be in the high 50's/ 60's.   Call the office and schedule a blood pressure follow up in ONE week.    Managing Your Hypertension Hypertension is commonly called high blood pressure. This is when the force of your blood pressing against the walls of your arteries is too strong. Arteries are blood vessels that carry blood from your heart throughout your body. Hypertension forces the heart to work harder to pump blood, and may cause the arteries to become narrow or stiff. Having untreated or uncontrolled hypertension can cause heart attack, stroke, kidney disease, and other problems. What are blood pressure readings? A blood pressure reading consists of a higher number over a lower number. Ideally, your blood pressure should be below 120/80. The first ("top") number is called the systolic pressure. It is a measure of the pressure in your arteries as your heart beats. The second ("bottom") number is called the diastolic pressure. It is a measure of the pressure in your arteries as the heart relaxes. What does my blood pressure reading mean? Blood pressure is classified into four stages. Based on your blood pressure reading, your health care provider may use the following stages to determine what type of treatment you need, if any. Systolic pressure and diastolic pressure are measured in a unit called mm Hg. Normal  Systolic pressure: below 094.  Diastolic pressure: below 80. Elevated  Systolic pressure: 709-628.  Diastolic pressure: below 80. Hypertension stage 1  Systolic pressure: 366-294.  Diastolic pressure: 76-54. Hypertension stage 2  Systolic pressure: 650 or above.  Diastolic pressure: 90 or  above. What health risks are associated with hypertension? Managing your hypertension is an important responsibility. Uncontrolled hypertension can lead to:  A heart attack.  A stroke.  A weakened blood vessel (aneurysm).  Heart failure.  Kidney damage.  Eye damage.  Metabolic syndrome.  Memory and concentration problems. What changes can I make to manage my hypertension? Hypertension can be managed by making lifestyle changes and possibly by taking medicines. Your health care provider will help you make a plan to bring your blood pressure within a normal range. Eating and drinking   Eat a diet that is high in fiber and potassium, and low in salt (sodium), added sugar, and fat. An example eating plan is called the DASH (Dietary Approaches to Stop Hypertension) diet. To eat this way: ? Eat plenty of fresh fruits and vegetables. Try to fill half of your plate at each meal with fruits and vegetables. ? Eat whole grains, such as whole wheat pasta, brown rice, or whole grain bread. Fill about one quarter of your plate with whole grains. ? Eat low-fat diary products. ? Avoid fatty cuts of meat, processed or cured meats, and poultry with skin. Fill about one quarter of your plate with lean proteins such as fish, chicken without skin, beans, eggs, and tofu. ? Avoid premade and processed foods. These tend to be higher in sodium, added sugar, and fat.  Reduce your daily sodium intake. Most people with hypertension should eat less than 1,500 mg of sodium a day.  Limit alcohol intake to no more than 1 drink a day for nonpregnant women and 2 drinks a day for  men. One drink equals 12 oz of beer, 5 oz of wine, or 1 oz of hard liquor. Lifestyle  Work with your health care provider to maintain a healthy body weight, or to lose weight. Ask what an ideal weight is for you.  Get at least 30 minutes of exercise that causes your heart to beat faster (aerobic exercise) most days of the week.  Activities may include walking, swimming, or biking.  Include exercise to strengthen your muscles (resistance exercise), such as weight lifting, as part of your weekly exercise routine. Try to do these types of exercises for 30 minutes at least 3 days a week.  Do not use any products that contain nicotine or tobacco, such as cigarettes and e-cigarettes. If you need help quitting, ask your health care provider.  Control any long-term (chronic) conditions you have, such as high cholesterol or diabetes. Monitoring  Monitor your blood pressure at home as told by your health care provider. Your personal target blood pressure may vary depending on your medical conditions, your age, and other factors.  Have your blood pressure checked regularly, as often as told by your health care provider. Working with your health care provider  Review all the medicines you take with your health care provider because there may be side effects or interactions.  Talk with your health care provider about your diet, exercise habits, and other lifestyle factors that may be contributing to hypertension.  Visit your health care provider regularly. Your health care provider can help you create and adjust your plan for managing hypertension. Will I need medicine to control my blood pressure? Your health care provider may prescribe medicine if lifestyle changes are not enough to get your blood pressure under control, and if:  Your systolic blood pressure is 130 or higher.  Your diastolic blood pressure is 80 or higher. Take medicines only as told by your health care provider. Follow the directions carefully. Blood pressure medicines must be taken as prescribed. The medicine does not work as well when you skip doses. Skipping doses also puts you at risk for problems. Contact a health care provider if:  You think you are having a reaction to medicines you have taken.  You have repeated (recurrent) headaches.  You feel  dizzy.  You have swelling in your ankles.  You have trouble with your vision. Get help right away if:  You develop a severe headache or confusion.  You have unusual weakness or numbness, or you feel faint.  You have severe pain in your chest or abdomen.  You vomit repeatedly.  You have trouble breathing. Summary  Hypertension is when the force of blood pumping through your arteries is too strong. If this condition is not controlled, it may put you at risk for serious complications.  Your personal target blood pressure may vary depending on your medical conditions, your age, and other factors. For most people, a normal blood pressure is less than 120/80.  Hypertension is managed by lifestyle changes, medicines, or both. Lifestyle changes include weight loss, eating a healthy, low-sodium diet, exercising more, and limiting alcohol. This information is not intended to replace advice given to you by your health care provider. Make sure you discuss any questions you have with your health care provider. Document Released: 07/02/2012 Document Revised: 09/05/2016 Document Reviewed: 09/05/2016 Elsevier Interactive Patient Education  2019 [employer].

## 2019-01-26 NOTE — Assessment & Plan Note (Signed)
Diastolic blood pressure continues to be around the low side.  Fortunately patient is asymptomatic.  We will do a trial stop of the lisinopril and see if diastolic blood pressure rebounds while systolic remains in adequate control.  Close follow-up in 1 week.

## 2019-01-28 ENCOUNTER — Telehealth: Payer: Self-pay

## 2019-01-28 NOTE — Telephone Encounter (Signed)
We received a fax from [facility] home delivery. Patient's HCTZ 12.5mg  is on backorder. They asked if replacement of HYDROCHLOTOT TAB 12.5mg  could be sent in instead? Please advise if this is okay?

## 2019-01-28 NOTE — Telephone Encounter (Signed)
Call pharmacy  I cannot find the medication listed anywhere to look it up.  Is it hydrochlorothiazide?  I am not familiar with the brand name. Is there a generic?

## 2019-02-02 ENCOUNTER — Other Ambulatory Visit: Payer: Self-pay

## 2019-02-02 ENCOUNTER — Ambulatory Visit (INDEPENDENT_AMBULATORY_CARE_PROVIDER_SITE_OTHER): Payer: Medicare Other | Admitting: Family

## 2019-02-02 ENCOUNTER — Encounter: Payer: Self-pay | Admitting: Family

## 2019-02-02 DIAGNOSIS — I10 Essential (primary) hypertension: Secondary | ICD-10-CM | POA: Diagnosis not present

## 2019-02-02 DIAGNOSIS — F419 Anxiety disorder, unspecified: Secondary | ICD-10-CM | POA: Diagnosis not present

## 2019-02-02 DIAGNOSIS — M25471 Effusion, right ankle: Secondary | ICD-10-CM | POA: Diagnosis not present

## 2019-02-02 NOTE — Assessment & Plan Note (Signed)
Controlled.  Using Xanax very rarely, half tablet.  Will continue.

## 2019-02-02 NOTE — Assessment & Plan Note (Signed)
Resolved at this time.  Patient politely declines the ultrasound of her calves.  She will continue to monitor.

## 2019-02-02 NOTE — Telephone Encounter (Signed)
Call mail order pharmacy regarding HCTZ regarding the below:  I cannot find the medication listed anywhere to look it up.  Is it hydrochlorothiazide?  I am not familiar with the brand name. Is there a generic? Also:   Does local [facility] have HCT[facility]?   Call pt back to ensure she is kept in the loop

## 2019-02-02 NOTE — Patient Instructions (Signed)
May hold lisinopril as discussed today.  You may continue to spot check your blood pressure.  Monitor blood pressure,  Goal is less than 120/80, based on newest guidelines; if persistently higher, please make sooner follow up appointment so we can recheck you blood pressure and manage medications   Certainly continue to monitor your legs and let me know if there is any swelling.  Let me know if anything at all, nice speaking with you today

## 2019-02-02 NOTE — Telephone Encounter (Signed)
I called and spoke with the pharmacy and changed the medication HCTZ from capsule to tablets because of the back order, The pharmacist took my RX by phone for tablets and stated they would send it to the pt's home in 5 to 7 business days, I called the patient and asked if she had enough medications until the arrival of the tablets and she stated she did.  [name],cma

## 2019-02-02 NOTE — Assessment & Plan Note (Signed)
Diastolic blood pressure has rebounded, not significant change in systolic blood pressure.  Will discontinue lisinopril for now.  Patient will continue to spot check her blood pressure and let us know if were to elevate.

## 2019-02-02 NOTE — Progress Notes (Signed)
This visit type was conducted due to national recommendations for restrictions regarding the COVID-19 pandemic (e.g. social distancing).  This format is felt to be most appropriate for this patient at this time.  All issues noted in this document were discussed and addressed.  No physical exam was performed (except for noted visual exam findings with Video Visits)  Interactive audio and video telecommunications were attempted between this provider and patient, however failed, due to patient having technical difficulties or patient did not have access to video capability.  Patient I could see each other well however audio did not work.  Note limited exam below we continued and completed visit with audio only.   Virtual Visit via Video Note  I connected with@  on 02/02/19 at 10:30 AM EDT by a video enabled telemedicine application and verified that I am speaking with the correct person using two identifiers.  Location patient: home Location provider:work  Persons participating in the virtual visit: patient, provider  I discussed the limitations of evaluation and management by telemedicine and the availability of in person appointments. The patient expressed understanding and agreed to proceed.   HPI: Feels well today. No new complaints.   HTN- trial stop of lisinopril due to DBP low. Last readings have been 'wonderful' , 125/59, 116/82, and today, 128/78. Denies exertional chest pain or pressure, numbness or tingling radiating to left arm or jaw, palpitations, dizziness, frequent headaches, changes in vision, or shortness of breath.    GAD-controlled.  Uses xanax very rarely with relief, last time used couple of weeks ago. Uses half tablet.   Right ankle swelling resolved. No fever, redness in ankle.  Declines [place] of calves.     ROS: See pertinent positives and negatives per HPI.  Past Medical History:  Diagnosis Date  . Anxiety 2011   patient with anxiety will continue Alprazolam prn   . Arthritis   . GERD (gastroesophageal reflux disease)    pt. with esophagea reflux. She reports great improvement with Dexilant.  [name] Hearing loss    s/p hearing aids  . 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 Hyperlipidemia    patient with HL on statins. lipids well controlled  . Hypertension     Past Surgical History:  Procedure Laterality Date  . ABDOMINAL HYSTERECTOMY  1993   multiple fibroid cysts  . cataract surgery  02/2011   bilateral  . TONSILLECTOMY      Family History  Problem Relation Age of Onset  . Heart failure Mother   . Heart disease Mother        CABG  . Hypertension Mother   . Hyperlipidemia Mother   . Stroke Mother        mini strokes, multiple  . Heart attack Father   . Heart failure Father   . Hyperlipidemia Father   . Hypertension Father   . Heart attack Sister 21       MI  . Hernia Sister   . Prostate cancer Brother     SOCIAL HX: Never smoker   Current Outpatient Medications:  .  acyclovir (ZOVIRAX) 400 MG tablet, TAKE 1 TABLET BY MOUTH EVERY DAY, Disp: 90 tablet, Rfl: 2 .  ALPRAZolam (XANAX) 0.25 MG tablet, TAKE 1 TABLET BY MOUTH TWICE DAILY AS NEEDED FOR ANXIETY, Disp: 30 tablet, Rfl: 1 .  amLODipine (NORVASC) 2.5 MG tablet, Take 1 tablet (2.5 mg total) by mouth daily., Disp: 90 tablet, Rfl: 1 .  aspirin 81 MG tablet, Take 81 mg by mouth  daily.  , Disp: , Rfl:  .  atorvastatin (LIPITOR) 20 MG tablet, TAKE 1 TABLET BY MOUTH EVERY DAY, Disp: 90 tablet, Rfl: 1 .  cholecalciferol (VITAMIN D) 1000 UNITS tablet, Take 1,000 Units by mouth daily.  , Disp: , Rfl:  .  Coenzyme Q10 (EQL COQ10) 300 MG CAPS, Take by mouth., Disp: , Rfl:  .  Flaxseed, Linseed, (FLAX SEED OIL PO), Take 1,400 mg by mouth daily., Disp: , Rfl:  .  Green Tea 150 MG CAPS, Take by mouth., Disp: , Rfl:  .  hydrochlorothiazide (MICROZIDE) 12.5 MG capsule, Take 1 capsule (12.5 mg total) by mouth daily., Disp: 90 capsule, Rfl: 3 .  levothyroxine (SYNTHROID, LEVOTHROID) 50 MCG tablet, TAKE  1 TABLET DAILY, Disp: 90 tablet, Rfl: 4 .  Misc Natural Products (TOTAL CARDIO HEALTH FORMULA PO), Take 1 tablet by mouth daily.  , Disp: , Rfl:  .  montelukast (SINGULAIR) 10 MG tablet, TAKE 1 TABLET AT BEDTIME, Disp: 90 tablet, Rfl: 1 .  Omega-3 Fatty Acids (FISH OIL TRIPLE STRENGTH) 1400 MG CAPS, Take by mouth., Disp: , Rfl:  .  pantoprazole (PROTONIX) 40 MG tablet, TAKE 1 TABLET (40 MG TOTAL) BY MOUTH DAILY., Disp: 90 tablet, Rfl: 0 .  Probiotic Product (PROBIOTIC DAILY PO), Take by mouth., Disp: , Rfl:   EXAM:  VITALS per patient if applicable:128/78  GENERAL: alert, oriented, appears well and in no acute distress  PSYCH/NEURO: pleasant and cooperative, no obvious depression or anxiety, speech and thought processing grossly intact  ASSESSMENT AND PLAN:  Discussed the following assessment and plan:  Essential hypertension  Right ankle swelling  Anxiety  Problem List Items Addressed This Visit      Cardiovascular and Mediastinum   HTN (hypertension)    Diastolic blood pressure has rebounded, not significant change in systolic blood pressure.  Will discontinue lisinopril for now.  Patient will continue to spot check her blood pressure and let us know if were to elevate.        Other   Anxiety    Controlled.  Using Xanax very rarely, half tablet.  Will continue.      Right ankle swelling    Resolved at this time.  Patient politely declines the ultrasound of her calves.  She will continue to monitor.           I discussed the assessment and treatment plan with the patient. The patient was provided an opportunity to ask questions and all were answered. The patient agreed with the plan and demonstrated an understanding of the instructions.   The patient was advised to call back or seek an in-person evaluation if the symptoms worsen or if the condition fails to improve as anticipated.   Mable Paris, FNP   I spent 15 min non face to face w/ pt.

## 2019-02-04 ENCOUNTER — Other Ambulatory Visit: Payer: Self-pay

## 2019-02-04 MED ORDER — PANTOPRAZOLE SODIUM 40 MG PO TBEC: DELAYED_RELEASE_TABLET | ORAL | 0 refills | Status: DC

## 2019-02-05 ENCOUNTER — Other Ambulatory Visit: Payer: Self-pay | Admitting: Family

## 2019-03-14 ENCOUNTER — Other Ambulatory Visit: Payer: Self-pay | Admitting: Family

## 2019-03-26 ENCOUNTER — Other Ambulatory Visit: Payer: Self-pay | Admitting: Family

## 2019-04-17 ENCOUNTER — Ambulatory Visit: Payer: Self-pay

## 2019-04-17 NOTE — Telephone Encounter (Signed)
   Answer Assessment - Initial Assessment Questions 1. SYMPTOMS: "Do you have any symptoms?"     Out going call to Pt.  Pt inquiring if she should take Zytertec D why on Amlodipine. 2. SEVERITY: If symptoms are present, ask "Are they mild, moderate or severe?" Has not taken yet.  Protocols used: MEDICATION QUESTION CALL-A-AH

## 2019-04-20 NOTE — Telephone Encounter (Signed)
LMTCB

## 2019-04-20 NOTE — Telephone Encounter (Signed)
Call patient I certainly do not have a problem with her taking Zyrtec-D on occasion.   The concern is always anything with a decongestant, blood pressure can elevate. ( classic example is Sudafed)  She would certainly need to watch her blood pressure if she is taking Zyrtec-D.  I think if her symptoms improve on plain Zyrtec, that would perhaps be a better option.

## 2019-04-21 ENCOUNTER — Telehealth: Payer: Self-pay

## 2019-04-21 NOTE — Telephone Encounter (Signed)
Copied from [place] [phone]. Topic: General - Other >> Apr 21, 2019 10:50 AM Celene Kras A wrote: Reason for CRM: Pt called stating she spoke with someone today. Pt states she doesn't remember who. Please advise.

## 2019-04-21 NOTE — Telephone Encounter (Signed)
I only see the 04/17/2019 note. I did not call patient, but check the 04/17/2019 message.

## 2019-04-23 NOTE — Telephone Encounter (Signed)
Patient advised on below & verbalized understanding.

## 2019-05-15 ENCOUNTER — Other Ambulatory Visit: Payer: Self-pay | Admitting: Family

## 2019-05-15 MED ORDER — PANTOPRAZOLE SODIUM 40 MG PO TBEC: DELAYED_RELEASE_TABLET | ORAL | 0 refills | Status: DC

## 2019-05-15 NOTE — Telephone Encounter (Signed)
Refill sent.

## 2019-05-15 NOTE — Telephone Encounter (Signed)
Medication Refill - Medication: pantoprazole (PROTONIX) 40 MG tablet  Has the patient contacted their pharmacy? Yes.   (Agent: If no, request that the patient contact the pharmacy for the refill.) (Agent: If yes, when and what did the pharmacy advise?)  Preferred Pharmacy (with phone number or street name):  [facility], [place] [phone] (Phone) [phone] (Fax)     Agent: Please be advised that RX refills may take up to 3 business days. We ask that you follow-up with your pharmacy.

## 2019-05-25 ENCOUNTER — Ambulatory Visit (INDEPENDENT_AMBULATORY_CARE_PROVIDER_SITE_OTHER): Payer: Medicare Other

## 2019-05-25 ENCOUNTER — Other Ambulatory Visit: Payer: Self-pay

## 2019-05-25 ENCOUNTER — Ambulatory Visit (INDEPENDENT_AMBULATORY_CARE_PROVIDER_SITE_OTHER): Payer: Medicare Other | Admitting: Vascular Surgery

## 2019-05-25 ENCOUNTER — Encounter (INDEPENDENT_AMBULATORY_CARE_PROVIDER_SITE_OTHER): Payer: Self-pay | Admitting: Vascular Surgery

## 2019-05-25 VITALS — BP 167/74 | HR 71 | Resp 10 | Ht 63.0 in | Wt 141.0 lb

## 2019-05-25 DIAGNOSIS — E782 Mixed hyperlipidemia: Secondary | ICD-10-CM

## 2019-05-25 DIAGNOSIS — I6523 Occlusion and stenosis of bilateral carotid arteries: Secondary | ICD-10-CM

## 2019-05-25 DIAGNOSIS — I10 Essential (primary) hypertension: Secondary | ICD-10-CM | POA: Diagnosis not present

## 2019-05-25 DIAGNOSIS — I739 Peripheral vascular disease, unspecified: Secondary | ICD-10-CM | POA: Diagnosis not present

## 2019-05-25 DIAGNOSIS — K219 Gastro-esophageal reflux disease without esophagitis: Secondary | ICD-10-CM

## 2019-05-25 NOTE — Progress Notes (Signed)
MRN : [id number]  Mary Pacheco is a 78 y.o. (22-Dec-1940) female who presents with chief complaint of  Chief Complaint  Patient presents with  . Follow-up  .  History of Present Illness:   The patient is seen for follow up evaluation of carotid stenosis. The carotid stenosis followed by ultrasound.   The patient denies amaurosis fugax. There is no recent history of TIA symptoms or focal motor deficits. There is no prior documented CVA.  The patient is taking enteric-coated aspirin 81 mg daily.  There is no history of migraine headaches. There is no history of seizures.  The patient has a history of coronary artery disease, no recent episodes of angina or shortness of breath. The patient denies PAD or claudication symptoms. There is a history of hyperlipidemia which is being treated with a statin  Duplex ultrasound of the carotid arteries done today shows 40-59% RICA and 87-86% LICA.  No significant difference compared to last study  Current Meds  Medication Sig  . acyclovir (ZOVIRAX) 400 MG tablet TAKE 1 TABLET BY MOUTH EVERY DAY  . ALPRAZolam (XANAX) 0.25 MG tablet TAKE 1 TABLET BY MOUTH TWICE DAILY AS NEEDED FOR ANXIETY  . amLODipine (NORVASC) 2.5 MG tablet Take 1 tablet (2.5 mg total) by mouth daily.  [name] aspirin 81 MG tablet Take 81 mg by mouth daily.    [name] atorvastatin (LIPITOR) 20 MG tablet TAKE 1 TABLET BY MOUTH EVERY DAY  . cholecalciferol (VITAMIN D) 1000 UNITS tablet Take 1,000 Units by mouth daily.    . Coenzyme Q10 (EQL COQ10) 300 MG CAPS Take by mouth.  . Flaxseed, Linseed, (FLAX SEED OIL PO) Take 1,400 mg by mouth daily.  Nyoka Cowden Tea 150 MG CAPS Take by mouth.  . hydrochlorothiazide (MICROZIDE) 12.5 MG capsule Take 1 capsule (12.5 mg total) by mouth daily.  [name] levothyroxine (SYNTHROID, LEVOTHROID) 50 MCG tablet TAKE 1 TABLET DAILY  . Misc Natural Products (TOTAL CARDIO HEALTH FORMULA PO) Take 1 tablet by mouth daily.    . Omega-3 Fatty Acids (FISH OIL TRIPLE  STRENGTH) 1400 MG CAPS Take by mouth.  . pantoprazole (PROTONIX) 40 MG tablet TAKE 1 TABLET (40 MG TOTAL) BY MOUTH DAILY.  . Probiotic Product (PROBIOTIC DAILY PO) Take by mouth.    Past Medical History:  Diagnosis Date  . Anxiety 2011   patient with anxiety will continue Alprazolam prn  . Arthritis   . GERD (gastroesophageal reflux disease)    pt. with esophagea reflux. She reports great improvement with Dexilant.  [name] Hearing loss    s/p hearing aids  . 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 Hyperlipidemia    patient with HL on statins. lipids well controlled  . Hypertension   . Skin cancer     Past Surgical History:  Procedure Laterality Date  . ABDOMINAL HYSTERECTOMY  1993   multiple fibroid cysts  . cataract surgery  02/2011   bilateral  . skin cancer removal    . TONSILLECTOMY      Social History Social History   Tobacco Use  . Smoking status: Never Smoker  . Smokeless tobacco: Never Used  Substance Use Topics  . Alcohol use: Yes    Comment: socially  . Drug use: No    Family History Family History  Problem Relation Age of Onset  . Heart failure Mother   . Heart disease Mother        CABG  . Hypertension Mother   . Hyperlipidemia Mother   .  Stroke Mother        mini strokes, multiple  . Heart attack Father   . Heart failure Father   . Hyperlipidemia Father   . Hypertension Father   . Heart attack Sister 48       MI  . Hernia Sister   . Prostate cancer Brother     Allergies  Allergen Reactions  . Molds & Smuts   . Rosuvastatin Hives  . Simvastatin     myalgia     REVIEW OF SYSTEMS (Negative unless checked)  Constitutional: [] Weight loss  [] Fever  [] Chills Cardiac: [] Chest pain   [] Chest pressure   [] Palpitations   [] Shortness of breath when laying flat   [] Shortness of breath with exertion. Vascular:  [x] Pain in legs with walking   [] Pain in legs at rest  [] History of DVT   [] Phlebitis   [] Swelling in legs   [] Varicose veins   [] Non-healing ulcers  Pulmonary:   [] Uses home oxygen   [] Productive cough   [] Hemoptysis   [] Wheeze  [] COPD   [] Asthma Neurologic:  [] Dizziness   [] Seizures   [] History of stroke   [] History of TIA  [] Aphasia   [] Vissual changes   [] Weakness or numbness in arm   [] Weakness or numbness in leg Musculoskeletal:   [] Joint swelling   [] Joint pain   [] Low back pain Hematologic:  [] Easy bruising  [] Easy bleeding   [] Hypercoagulable state   [] Anemic Gastrointestinal:  [] Diarrhea   [] Vomiting  [] Gastroesophageal reflux/heartburn   [] Difficulty swallowing. Genitourinary:  [] Chronic kidney disease   [] Difficult urination  [] Frequent urination   [] Blood in urine Skin:  [] Rashes   [] Ulcers  Psychological:  [] History of anxiety   []  History of major depression.  Physical Examination  Vitals:   05/25/19 1501  BP: (!) 167/74  Pulse: 71  Resp: 10  Weight: 141 lb (64 kg)  Height: 5\' 3"  (1.6 m)   Body mass index is 24.98 kg/m. Gen: WD/WN, NAD Head: [place]/AT, No temporalis wasting.  Ear/Nose/Throat: Hearing grossly intact, nares w/o erythema or drainage Eyes: PER, EOMI, sclera nonicteric.  Neck: Supple, no large masses.   Pulmonary:  Good air movement, no audible wheezing bilaterally, no use of accessory muscles.  Cardiac: RRR, no JVD Vascular: bilateral carotid bruit Vessel Right Left  PT Not Palpable Not Palpable  DP Not Palpable Not Palpable  Gastrointestinal: Non-distended. No guarding/no peritoneal signs.  Musculoskeletal: M/S 5/5 throughout.  No deformity or atrophy.  Neurologic: CN 2-12 intact. Symmetrical.  Speech is fluent. Motor exam as listed above. Psychiatric: Judgment intact, Mood & affect appropriate for pt's clinical situation. Dermatologic: No rashes or ulcers noted.  No changes consistent with cellulitis. Lymph : No lichenification or skin changes of chronic lymphedema.  CBC Lab Results  Component Value Date   WBC 6.5 11/11/2017   HGB 13.3 11/11/2017   HCT 39.6 11/11/2017   MCV 91.1 11/11/2017    PLT 255 11/11/2017    BMET    Component Value Date/Time   NA 138 08/25/2018 0911   NA 140 12/28/2011 1819   K 3.9 08/25/2018 0911   K 3.2 (L) 12/28/2011 1819   CL 102 08/25/2018 0911   CL 104 12/28/2011 1819   CO2 29 08/25/2018 0911   CO2 27 12/28/2011 1819   GLUCOSE 120 (H) 08/25/2018 0911   GLUCOSE 98 12/28/2011 1819   BUN 16 08/25/2018 0911   BUN 13 12/28/2011 1819   CREATININE 0.87 08/25/2018 0911   CREATININE 1.00 11/06/2013 0854  CALCIUM 9.9 08/25/2018 0911   CALCIUM 9.1 12/28/2011 1819   GFRNONAA >60 11/11/2017 1452   GFRNONAA 56 (L) 11/06/2013 0854   GFRAA >60 11/11/2017 1452   GFRAA >60 11/06/2013 0854   CrCl cannot be calculated (Patient's most recent lab result is older than the maximum 21 days allowed.).  COAG No results found for: INR, PROTIME  Radiology No results found.   Assessment/Plan 1. Bilateral carotid artery stenosis Recommend:  Given the patient's asymptomatic subcritical stenosis no further invasive testing or surgery at this time.  Continue antiplatelet therapy as prescribed Continue management of CAD, HTN and Hyperlipidemia Healthy heart diet, encouraged exercise at least 4 times per week Follow up in 6 months with duplex ultrasound and physical exam  - VAS US CAROTID; Future  2. PAD (peripheral artery disease) (HCC)  Recommend:  The patient has evidence of atherosclerosis of the lower extremities with claudication.  The patient does not voice lifestyle limiting changes at this point in time.  Noninvasive studies do not suggest clinically significant change.  No invasive studies, angiography or surgery at this time The patient should continue walking and begin a more formal exercise program.  The patient should continue antiplatelet therapy and aggressive treatment of the lipid abnormalities  No changes in the patient's medications at this time  The patient should continue wearing graduated compression socks 10-15 mmHg  strength to control the mild edema.    3. Essential hypertension Continue antihypertensive medications as already ordered, these medications have been reviewed and there are no changes at this time.   4. Gastroesophageal reflux disease, esophagitis presence not specified Continue PPI as already ordered, this medication has been reviewed and there are no changes at this time.  Avoidence of caffeine and alcohol  Moderate elevation of the head of the bed   5. Mixed hyperlipidemia Continue statin as ordered and reviewed, no changes at this time    Hortencia Pilar, MD  05/25/2019 3:19 PM

## 2019-05-27 ENCOUNTER — Encounter (INDEPENDENT_AMBULATORY_CARE_PROVIDER_SITE_OTHER): Payer: Self-pay | Admitting: Vascular Surgery

## 2019-06-05 ENCOUNTER — Ambulatory Visit (INDEPENDENT_AMBULATORY_CARE_PROVIDER_SITE_OTHER): Payer: Medicare Other | Admitting: Family

## 2019-06-05 ENCOUNTER — Encounter: Payer: Self-pay | Admitting: Family

## 2019-06-05 ENCOUNTER — Other Ambulatory Visit: Payer: Self-pay

## 2019-06-05 DIAGNOSIS — R21 Rash and other nonspecific skin eruption: Secondary | ICD-10-CM | POA: Diagnosis not present

## 2019-06-05 DIAGNOSIS — I10 Essential (primary) hypertension: Secondary | ICD-10-CM | POA: Diagnosis not present

## 2019-06-05 MED ORDER — TRIAMCINOLONE ACETONIDE 0.025 % EX CREA: 1.0000 "application " | TOPICAL_CREAM | Freq: Two times a day (BID) | CUTANEOUS | 0 refills | Status: DC

## 2019-06-05 MED ORDER — MUPIROCIN 2 % EX OINT: 1.0000 "application " | TOPICAL_OINTMENT | Freq: Two times a day (BID) | CUTANEOUS | 0 refills | Status: DC

## 2019-06-05 MED ORDER — CEPHALEXIN 500 MG PO CAPS: 500.0000 mg | ORAL_CAPSULE | Freq: Four times a day (QID) | ORAL | 0 refills | Status: DC

## 2019-06-05 NOTE — Assessment & Plan Note (Signed)
Controlled, continue current regimen

## 2019-06-05 NOTE — Patient Instructions (Addendum)
Start oral antibiotic if needed over weekend to cover for secondary bacterial infection.   Start topical steroid, topical antibiotic.   Maintain close vigilance and give our office a call and let us know how you are doing.  Certainly if over the weekend , the rash were to worsen or new symptoms present, you must seek immediate attention in urgent care or emergency room

## 2019-06-05 NOTE — Assessment & Plan Note (Signed)
Patient is well-appearing, she is nontoxic in appearance.  Etiology of rash is nonspecific at this time.  Discussed differential including insect bites and/or contact dermatitis from plant.  There is a linear distribution, nonvesicular. I  have some concern of secondary infection.  Will start topical Kenalog, Bactroban, and if no improvement over the next 24 hours, patient understands to start oral antibiotic over weekend.  She will call us in 2 days let us know how her symptoms are.  She will keep dermatology appointment as scheduled in 2 weeks.

## 2019-06-05 NOTE — Progress Notes (Signed)
This visit type was conducted due to national recommendations for restrictions regarding the COVID-19 pandemic (e.g. social distancing).  This format is felt to be most appropriate for this patient at this time.  All issues noted in this document were discussed and addressed.  No physical exam was performed (except for noted visual exam findings with Video Visits). Virtual Visit via Video Note  I connected with@  on 06/05/19 at 11:30 AM EDT by a video enabled telemedicine application and verified that I am speaking with the correct person using two identifiers.  Location patient: home Location provider:work  Persons participating in the virtual visit: patient, provider  I discussed the limitations of evaluation and management by telemedicine and the availability of in person appointments. The patient expressed understanding and agreed to proceed.   HPI:  Left knee was first lesion noted, 2 days ago, appears to have bumps around it. Still spreading. Next felt bump left side of neck.  Then noticed left side of cheek linear bumps and above left eye. No vision changes, lesions to the eye/eyelids.   Bumps remind her of pimples. Do not like a blisters.   Thinks from spider bite but not sure. On deck 3 days ago when thinks it happened. Prior too, had been working in yard.  Not painful;itchy. Has been using calamine lotion which helped with itching. Intermitent use of OTC antihistamine.   No ticks seen. No fever, unusual joint pain, sob, fever, cough.  No one else at home has rash.   Similar episode a couple of years ago.  Allergic to sumac.   HTN- has been good. At home,114/80.   Compliant with medication. Denies exertional chest pain or pressure, numbness or tingling radiating to left arm or jaw, palpitations, dizziness, frequent headaches, changes in vision, or shortness of breath.   ROS: See pertinent positives and negatives per HPI.  Past Medical History:  Diagnosis Date  . Anxiety  2011   patient with anxiety will continue Alprazolam prn  . Arthritis   . GERD (gastroesophageal reflux disease)    pt. with esophagea reflux. She reports great improvement with Dexilant.  [name] Hearing loss    s/p hearing aids  . 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 Hyperlipidemia    patient with HL on statins. lipids well controlled  . Hypertension   . Skin cancer     Past Surgical History:  Procedure Laterality Date  . ABDOMINAL HYSTERECTOMY  1993   multiple fibroid cysts  . cataract surgery  02/2011   bilateral  . skin cancer removal    . TONSILLECTOMY      Family History  Problem Relation Age of Onset  . Heart failure Mother   . Heart disease Mother        CABG  . Hypertension Mother   . Hyperlipidemia Mother   . Stroke Mother        mini strokes, multiple  . Heart attack Father   . Heart failure Father   . Hyperlipidemia Father   . Hypertension Father   . Heart attack Sister 55       MI  . Hernia Sister   . Prostate cancer Brother     SOCIAL HX: never smoker   Current Outpatient Medications:  .  acyclovir (ZOVIRAX) 400 MG tablet, TAKE 1 TABLET BY MOUTH EVERY DAY, Disp: 90 tablet, Rfl: 2 .  ALPRAZolam (XANAX) 0.25 MG tablet, TAKE 1 TABLET BY MOUTH TWICE DAILY AS NEEDED FOR ANXIETY, Disp: 30 tablet, Rfl:  1 .  amLODipine (NORVASC) 2.5 MG tablet, Take 1 tablet (2.5 mg total) by mouth daily., Disp: 90 tablet, Rfl: 1 .  aspirin 81 MG tablet, Take 81 mg by mouth daily.  , Disp: , Rfl:  .  atorvastatin (LIPITOR) 20 MG tablet, TAKE 1 TABLET BY MOUTH EVERY DAY, Disp: 90 tablet, Rfl: 1 .  cholecalciferol (VITAMIN D) 1000 UNITS tablet, Take 1,000 Units by mouth daily.  , Disp: , Rfl:  .  Coenzyme Q10 (EQL COQ10) 300 MG CAPS, Take by mouth., Disp: , Rfl:  .  Flaxseed, Linseed, (FLAX SEED OIL PO), Take 1,400 mg by mouth daily., Disp: , Rfl:  .  Green Tea 150 MG CAPS, Take by mouth., Disp: , Rfl:  .  hydrochlorothiazide (MICROZIDE) 12.5 MG capsule, Take 1 capsule (12.5 mg total) by  mouth daily., Disp: 90 capsule, Rfl: 3 .  levothyroxine (SYNTHROID, LEVOTHROID) 50 MCG tablet, TAKE 1 TABLET DAILY, Disp: 90 tablet, Rfl: 4 .  Misc Natural Products (TOTAL CARDIO HEALTH FORMULA PO), Take 1 tablet by mouth daily.  , Disp: , Rfl:  .  Omega-3 Fatty Acids (FISH OIL TRIPLE STRENGTH) 1400 MG CAPS, Take by mouth., Disp: , Rfl:  .  pantoprazole (PROTONIX) 40 MG tablet, TAKE 1 TABLET (40 MG TOTAL) BY MOUTH DAILY., Disp: 90 tablet, Rfl: 0 .  Probiotic Product (PROBIOTIC DAILY PO), Take by mouth., Disp: , Rfl:  .  cephALEXin (KEFLEX) 500 MG capsule, Take 1 capsule (500 mg total) by mouth every 6 (six) hours., Disp: 28 capsule, Rfl: 0 .  mupirocin ointment (BACTROBAN) 2 %, Apply 1 application topically 2 (two) times daily., Disp: 22 g, Rfl: 0 .  triamcinolone (KENALOG) 0.025 % cream, Apply 1 application topically 2 (two) times daily., Disp: 15 g, Rfl: 0  EXAM:  VITALS per patient if applicable:  GENERAL: alert, oriented, appears well and in no acute distress  HEENT: atraumatic, conjunttiva clear, no obvious abnormalities on inspection of external nose and ears  NECK: normal movements of the head and neck  LUNGS: on inspection no signs of respiratory distress, breathing rate appears normal, no obvious gross SOB, gasping or wheezing  CV: no obvious cyanosis  MS: moves all visible extremities without noticeable abnormality  PSYCH/NEURO: pleasant and cooperative, no obvious depression or anxiety, speech and thought processing grossly intact  SKIN: Erythematous, grouped papules seen in linear fashion over left side of cheek, and a discrete lesion noted over left brow.  Does not appear purulent or vesicular.  No history unable to see over video lesions over left clavicular area  ASSESSMENT AND PLAN:  Discussed the following assessment and plan:  Problem List Items Addressed This Visit      Cardiovascular and Mediastinum   HTN (hypertension)    Controlled, continue current  regimen        Musculoskeletal and Integument   Rash - Primary    Patient is well-appearing, she is nontoxic in appearance.  Etiology of rash is nonspecific at this time.  Discussed differential including insect bites and/or contact dermatitis from plant.  There is a linear distribution, nonvesicular. I  have some concern of secondary infection.  Will start topical Kenalog, Bactroban, and if no improvement over the next 24 hours, patient understands to start oral antibiotic over weekend.  She will call us in 2 days let us know how her symptoms are.  She will keep dermatology appointment as scheduled in 2 weeks.       Relevant Medications   mupirocin  ointment (BACTROBAN) 2 %   triamcinolone (KENALOG) 0.025 % cream   cephALEXin (KEFLEX) 500 MG capsule        I discussed the assessment and treatment plan with the patient. The patient was provided an opportunity to ask questions and all were answered. The patient agreed with the plan and demonstrated an understanding of the instructions.   The patient was advised to call back or seek an in-person evaluation if the symptoms worsen or if the condition fails to improve as anticipated.   Mary Paris, FNP

## 2019-06-08 ENCOUNTER — Telehealth: Payer: Self-pay | Admitting: *Deleted

## 2019-06-08 NOTE — Telephone Encounter (Signed)
See note from [facility] she is feeling better and sounds like rash improving. Suspect she had bacterial component   Did she ever start keflex?  If she feels like heading in right direction, I would continue treatment, observe, let us know if any new concerns, and keep appt with dermatology coming up

## 2019-06-08 NOTE — Telephone Encounter (Signed)
Copied from [place] [phone]. Topic: General - Other >> Jun 08, 2019 11:55 AM Mary Pacheco wrote: Reason for CRM: Pt called to give update on welts. Pt states she is much better today than she was on Friday. Pt states the welts are more pink and no longer red and not itching as much, so she feels the medication is helping. Please advise.

## 2019-06-08 NOTE — Telephone Encounter (Signed)
Yes patient started Keflex on Friday and stated rash improving but will keep appointment with dermatology as advised.

## 2019-06-08 NOTE — Telephone Encounter (Signed)
See note from [facility]

## 2019-06-09 ENCOUNTER — Telehealth: Payer: Self-pay | Admitting: Family

## 2019-06-09 DIAGNOSIS — R21 Rash and other nonspecific skin eruption: Secondary | ICD-10-CM

## 2019-06-12 MED ORDER — MUPIROCIN 2 % EX OINT: 1.0000 "application " | TOPICAL_OINTMENT | Freq: Two times a day (BID) | CUTANEOUS | 1 refills | Status: DC

## 2019-06-12 MED ORDER — TRIAMCINOLONE ACETONIDE 0.025 % EX CREA: 1.0000 "application " | TOPICAL_CREAM | Freq: Two times a day (BID) | CUTANEOUS | 1 refills | Status: DC

## 2019-06-12 NOTE — Telephone Encounter (Signed)
I spoke with patient & let her know that refills had been sent. Patient was upset that she was not responded to sooner. She has had some improvement with whelps going down she is just still very red. She will keep appointment with dermatology for Monday.

## 2019-06-12 NOTE — Telephone Encounter (Signed)
Patient requesting refill on triamcinolone and on mupirocin cream.

## 2019-06-12 NOTE — Telephone Encounter (Signed)
Call pt Refilled

## 2019-06-12 NOTE — Telephone Encounter (Signed)
Pt calling to check status. Pt would like a call back from the office. Please advise

## 2019-06-12 NOTE — Addendum Note (Signed)
Addended by: Burnard Hawthorne on: 06/12/2019 04:05 PM   Modules accepted: Orders

## 2019-06-13 ENCOUNTER — Other Ambulatory Visit: Payer: Self-pay | Admitting: Family

## 2019-06-17 NOTE — Telephone Encounter (Signed)
I looked up patient on [place] Controlled Substances Reporting System and saw no activity that raised concern of inappropriate use.   

## 2019-07-08 ENCOUNTER — Telehealth: Payer: Self-pay | Admitting: Family

## 2019-07-08 NOTE — Telephone Encounter (Signed)
Call pt Mammogram normal; continue annual screening

## 2019-07-09 NOTE — Telephone Encounter (Signed)
I spoke with patient & let her know that mammo came back normal. Patient had no further questions.

## 2019-07-19 ENCOUNTER — Other Ambulatory Visit: Payer: Self-pay | Admitting: Family

## 2019-07-19 DIAGNOSIS — I10 Essential (primary) hypertension: Secondary | ICD-10-CM

## 2019-08-02 NOTE — Discharge Instructions (Signed)
°  Instructions after Total Knee Replacement ° ° Angeldejesus Callaham P. Ketsia Linebaugh, [name], M.D.    ° Dept. of Orthopaedics & Sports Medicine ° [facility] ° [address] ° [place], [place]  [address] ° Phone: [phone]   Fax: [phone] ° °  °DIET: °• Drink plenty of non-alcoholic fluids. °• Resume your normal diet. Include foods high in fiber. ° °ACTIVITY:  °• You may use crutches or a walker with weight-bearing as tolerated, unless instructed otherwise. °• You may be weaned off of the walker or crutches by your Physical Therapist.  °• Do NOT place pillows under the knee. Anything placed under the knee could limit your ability to straighten the knee.   °• Continue doing gentle exercises. Exercising will reduce the pain and swelling, increase motion, and prevent muscle weakness.   °• Please continue to use the TED compression stockings for 6 weeks. You may remove the stockings at night, but should reapply them in the morning. °• Do not drive or operate any equipment until instructed. ° °WOUND CARE:  °• Continue to use the PolarCare or ice packs periodically to reduce pain and swelling. °• You may bathe or shower after the staples are removed at the first office visit following surgery. ° °MEDICATIONS: °• You may resume your regular medications. °• Please take the pain medication as prescribed on the medication. °• Do not take pain medication on an empty stomach. °• You have been given a prescription for a blood thinner (Lovenox or Coumadin). Please take the medication as instructed. (NOTE: After completing a 2 week course of Lovenox, take one Enteric-coated aspirin once a day. This along with elevation will help reduce the possibility of phlebitis in your operated leg.) °• Do not drive or drink alcoholic beverages when taking pain medications. ° °CALL THE OFFICE FOR: °• Temperature above 101 degrees °• Excessive bleeding or drainage on the dressing. °• Excessive swelling, coldness, or paleness of the toes. °• Persistent  nausea and vomiting. ° °FOLLOW-UP:  °• You should have an appointment to return to the office in 10-14 days after surgery. °• Arrangements have been made for continuation of Physical Therapy (either home therapy or outpatient therapy). °  °

## 2019-08-11 ENCOUNTER — Encounter
Admission: RE | Admit: 2019-08-11 | Discharge: 2019-08-11 | Disposition: A | Discharge status: Home / Self Care | Payer: Medicare Other | Source: Ambulatory Visit | Attending: Orthopedic Surgery | Admitting: Orthopedic Surgery

## 2019-08-11 ENCOUNTER — Other Ambulatory Visit: Payer: Self-pay

## 2019-08-11 DIAGNOSIS — Z01818 Encounter for other preprocedural examination: Secondary | ICD-10-CM | POA: Insufficient documentation

## 2019-08-11 HISTORY — DX: Prediabetes: R73.03

## 2019-08-11 HISTORY — DX: Hypothyroidism, unspecified: E03.9

## 2019-08-11 LAB — URINALYSIS, ROUTINE W REFLEX MICROSCOPIC
Bilirubin Urine: NEGATIVE
Glucose, UA: NEGATIVE mg/dL
Hgb urine dipstick: NEGATIVE
Ketones, ur: NEGATIVE mg/dL
Leukocytes,Ua: NEGATIVE
Nitrite: NEGATIVE
Protein, ur: NEGATIVE mg/dL
Specific Gravity, Urine: 1.009 (ref 1.005–1.030)
pH: 5 (ref 5.0–8.0)

## 2019-08-11 LAB — CBC
HCT: 40.2 % (ref 36.0–46.0)
Hemoglobin: 13 g/dL (ref 12.0–15.0)
MCH: 30.2 pg (ref 26.0–34.0)
MCHC: 32.3 g/dL (ref 30.0–36.0)
MCV: 93.5 fL (ref 80.0–100.0)
Platelets: 298 10*3/uL (ref 150–400)
RBC: 4.3 MIL/uL (ref 3.87–5.11)
RDW: 12.7 % (ref 11.5–15.5)
WBC: 6.9 10*3/uL (ref 4.0–10.5)
nRBC: 0 % (ref 0.0–0.2)

## 2019-08-11 LAB — APTT: aPTT: 32 seconds (ref 24–36)

## 2019-08-11 LAB — COMPREHENSIVE METABOLIC PANEL
ALT: 20 U/L (ref 0–44)
AST: 21 U/L (ref 15–41)
Albumin: 4.7 g/dL (ref 3.5–5.0)
Alkaline Phosphatase: 74 U/L (ref 38–126)
Anion gap: 9 (ref 5–15)
BUN: 22 mg/dL (ref 8–23)
CO2: 27 mmol/L (ref 22–32)
Calcium: 9.8 mg/dL (ref 8.9–10.3)
Chloride: 102 mmol/L (ref 98–111)
Creatinine, Ser: 0.78 mg/dL (ref 0.44–1.00)
GFR calc Af Amer: >60 mL/min (ref >60)
GFR calc non Af Amer: >60 mL/min (ref >60)
Glucose, Bld: 114 mg/dL — ABNORMAL HIGH (ref 70–99)
Potassium: 3.6 mmol/L (ref 3.5–5.1)
Sodium: 138 mmol/L (ref 135–145)
Total Bilirubin: 0.6 mg/dL (ref 0.3–1.2)
Total Protein: 7.5 g/dL (ref 6.5–8.1)

## 2019-08-11 LAB — SEDIMENTATION RATE: Sed Rate: 5 mm/hr (ref 0–30)

## 2019-08-11 LAB — TYPE AND SCREEN
ABO/RH(D): A NEG
Antibody Screen: NEGATIVE

## 2019-08-11 LAB — SURGICAL PCR SCREEN
MRSA, PCR: NEGATIVE
Staphylococcus aureus: NEGATIVE

## 2019-08-11 LAB — C-REACTIVE PROTEIN: CRP: <0.8 mg/dL (ref <1.0)

## 2019-08-11 LAB — PROTIME-INR
INR: 1 (ref 0.8–1.2)
Prothrombin Time: 12.9 seconds (ref 11.4–15.2)

## 2019-08-11 NOTE — Patient Instructions (Signed)
Your COVID swab is scheduled for Friday August 14, 2019. Drive up in front of the [facility] between 8-10:30 am and remain in your vehicle.    Your surgery is scheduled on: Wednesday August 19, 2019 Report to Same Day Surgery 2nd floor [facility] Entrance-take elevator on left to 2nd floor.  Check in with surgery information desk.) To find out your arrival time, call [phone] 1:00-3:00 PM on Tuesday August 18, 2019  Remember: Instructions that are not followed completely may result in serious medical risk, up to and including death, or upon the discretion of your surgeon and anesthesiologist your surgery may need to be rescheduled.    __x__ 1. Do not eat food (including mints, candies, chewing gum) after midnight the night before your procedure. You may drink clear liquids up to 2 hours before you are scheduled to arrive at the hospital for your procedure.  Do not drink anything within 2 hours of your scheduled arrival to the hospital.  Approved clear liquids:  --Water or Apple juice without pulp  --Clear carbohydrate beverage such as Gatorade or Powerade  --Black Coffee or Clear Tea (No milk, no creamers, do not add anything to the coffee or tea)  Finish your Ensure Pre-Surgery drink 2 hours before your scheduled arrival time on the day of surgery.    __x__ 2. No Alcohol for 24 hours before or after surgery.   __x__ 3. No Smoking or e-cigarettes for 24 hours before surgery.  Do not use any chewable tobacco products for at least 6 hours before surgery.   __x__ 4. Notify your doctor if there is any change in your medical condition (cold, fever, infections).   __x__ 5. On the morning of surgery brush your teeth with toothpaste and water.  You may rinse your mouth with mouthwash if you wish.  Do not swallow any toothpaste or mouthwash.  Please read over the following fact sheets that you were given:   [facility] Preparing for Surgery and/or MRSA Information     __x__ Use CHG Soap as directed on instruction sheet.   Do not wear jewelry, make-up, hairpins, clips or nail polish on the day of surgery.  Do not wear lotions, powders, deodorant, or perfumes.   Do not shave below the face/neck 48 hours prior to surgery.   Do not bring valuables to the hospital.    [facility] is not responsible for any belongings or valuables.               Contacts, dentures or bridgework may not be worn into surgery.  For patients admitted to the hospital, discharge time is determined by your treatment team.   __x__ Take these medicines on the morning of surgery with a SMALL SIP OF WATER:  1. Levothyroxine/Synthroid  2. Alprazolam/Xanax if needed   __x__ Skip your Hydrochlorothiazide (Microzide) ONLY on the day of surgery.  __x__ Follow recommendations from Cardiologist, Pulmonologist or PCP regarding stopping Aspirin, Coumadin, Plavix, Eliquis, Effient, Pradaxa, and Pletal.  __x__ STARTING TODAY: Stop Anti-inflammatories such as ASPIRIN, Advil, Ibuprofen, Motrin, Aleve, Naproxen, Naprosyn, BC/Goodies powders or aspirin products. You may continue to take Tylenol and Celebrex.   __x__ STARTING TODAY: Stop all supplements (Prevagen, Vitamin D, CoQ10, Cranberry extract, Flaxseed, Green Tea, CardioCover, Omega-3/Fish Oil, Probiotic) until after surgery.   Continue all other medicines as scheduled except as noted above.

## 2019-08-12 LAB — URINE CULTURE
Culture: NO GROWTH
Special Requests: NORMAL

## 2019-08-14 ENCOUNTER — Other Ambulatory Visit
Admission: RE | Admit: 2019-08-14 | Discharge: 2019-08-14 | Disposition: A | Discharge status: Home / Self Care | Payer: Medicare Other | Source: Ambulatory Visit | Attending: Orthopedic Surgery | Admitting: Orthopedic Surgery

## 2019-08-14 ENCOUNTER — Other Ambulatory Visit: Payer: Self-pay | Admitting: Family

## 2019-08-14 ENCOUNTER — Other Ambulatory Visit: Payer: Self-pay

## 2019-08-14 DIAGNOSIS — Z01812 Encounter for preprocedural laboratory examination: Secondary | ICD-10-CM | POA: Diagnosis present

## 2019-08-14 DIAGNOSIS — Z20828 Contact with and (suspected) exposure to other viral communicable diseases: Secondary | ICD-10-CM | POA: Diagnosis not present

## 2019-08-14 LAB — SARS CORONAVIRUS 2 (TAT 6-24 HRS): SARS Coronavirus 2: NEGATIVE

## 2019-08-17 ENCOUNTER — Telehealth: Payer: Self-pay

## 2019-08-17 NOTE — Telephone Encounter (Signed)
Noted  

## 2019-08-17 NOTE — Telephone Encounter (Signed)
Copied from [place] [id number]. Topic: General - Other >> Aug 17, 2019  3:50 PM Rainey Pines A wrote: Patient called to inform provider that she will be in hospital for 2 nights  10/28.

## 2019-08-18 ENCOUNTER — Encounter: Payer: Self-pay | Admitting: Orthopedic Surgery

## 2019-08-18 NOTE — H&P (Signed)
[facility]  Chief Complaint  Patient presents with  . Knee Pain  H & P LEFT KNEE   History of Present Illness:  Mary Pacheco is a 78 y.o. female that presents to clinic today for her preoperative history and evaluation. Patient presents with her husband. The patient is scheduled to undergo a left total knee arthroplasty on 08/19/2019 by Dr. Marry Guan. Her pain began several years ago. The pain is located throughout the knee. Patient states the pain is worse with weightbearing and also aches at night. She reports associated weakness of the knee. She denies associated numbness or tingling. Patient states she has no history of blood clots.  The patient's symptoms have progressed to the point that they decrease her quality of life. The patient has previously undergone conservative treatment including NSAIDS, injections, and arthroscopic knee surgery without adequate control of her symptoms.  Past Medical History:  . Anxiety  . Arthritis  . Bilateral carotid artery stenosis  . Gastroesophageal reflux disease  . Genital herpes  . Hearing loss  . Hyperlipidemia  . Hypertension  . Thyroid disease   Past Surgical History:  . CATARACT EXTRACTION Bilateral  2012  . COLONOSCOPY W/BIOPSY N/A 05/18/2014  Procedure: SCREENING COLONOSCOPY; Surgeon: Colvin Caroli, MD; Location: [place]; Service: Gastroenterology; Laterality: N/A;  . [place]  . HYSTERECTOMY TOTAL ABDOMINAL W/REMOVAL TUBES &/OR OVARIES  1993  . Left knee arthroscopy, lateral meniscectomy, and lateral chondroplasty 03/03/2010  Dr. Marry Guan   Current Medications:  . apoaequorin (PREVAGEN) capsule Take 1 capsule by mouth once daily  . acyclovir (ZOVIRAX) 400 MG tablet (Patient taking differently: Take 400 mg by mouth once daily. )  . ALPRAZolam (XANAX) 0.25 MG tablet TAKE 1 TABLET BY MOUTH 3 TIMES A DAY AS NEEDED FOR  ANXIETY  . amLODIPine (NORVASC) 2.5 MG tablet Take 2.5 mg by mouth once daily  . aspirin 81 MG EC tablet Take 81 mg by mouth once daily.  [name] atorvastatin (LIPITOR) 10 MG tablet Take 10 mg by mouth once daily  . cholecalciferol (VITAMIN D3) 1,000 unit capsule Take 1,000 Units by mouth once daily.  . COQ10, UBIQUINOL, ORAL Take 1 capsule by mouth once daily.  . cranberry 400 mg Cap Take 1 capsule by mouth once daily.  [name] FLAXSEED ORAL Take 1 capsule by mouth once daily.  [name] green tea leaf extract (GREEN TEA) Cap Take 1 capsule by mouth once daily.  . hydrochlorothiazide (MICROZIDE) 12.5 mg capsule (Patient taking differently: Take 12.5 mg by mouth once daily. )  . levothyroxine (SYNTHROID, LEVOTHROID) 50 MCG tablet TAKE 1 TABLET DAILY  . omega-3 fatty acids/fish oil 340-1,000 mg capsule Take 1 capsule by mouth once daily.  . pantoprazole (PROTONIX) 40 MG DR tablet TAKE 1 TABLET (40 MG TOTAL) BY MOUTH DAILY.  [name] SACCHAROMYCES BOULARDII (PROBIOTIC, S.BOULARDII, ORAL) Take by mouth once daily.   Allergies:  [name] Mold Extracts Shortness Of Breath  . Crestor [Rosuvastatin] Hives  . Mold Other (See Comments)  Induces asthma   Social History:  Socioeconomic History  . Marital status: Married  Spouse name: [name]  . Number of children: 3  . Years of education: 16  . Highest education level: Bachelor's degree (e.g., BA, AB, BS)  Occupational History  . 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 Financial resource strain: Not on file  . Food insecurity  Worry: Not on file  Inability:  Not on file  . Transportation needs  Medical: Not on file  Non-medical: Not on file  Tobacco Use  . Smoking status: Never Smoker  . Smokeless tobacco: Never Used  Substance and Sexual Activity  . Alcohol use: Yes  Alcohol/week: 0.0 standard drinks  Comment: Socially-couple drinks a month  . Drug use: No  . Sexual activity: Yes  Partners: Male  Lifestyle  . Physical activity  Days per week:  Not on file  Minutes per session: Not on file  . Stress: Not on file  Relationships  . Social Medical illustrator on phone: Not on file  Gets together: Not on file  Attends religious service: Not on file  Active member of club or organization: Not on file  Attends meetings of clubs or organizations: Not on file  Relationship status: Not on file  Other Topics Concern  . Not on file  Social History Narrative  . Not on file   Family History:  . Diabetes type II Mother  . Heart disease Father  . Anesthesia problems Neg Hx   Review of Systems:  A 10+ ROS was performed, reviewed, and the pertinent orthopaedic findings are documented in the HPI.   Physical Examination:  BP 140/70  Ht 162.6 cm (5\' 4" )  Wt 65 kg (143 lb 6.4 oz)  LMP (LMP Unknown) Comment: Hysterectomy  BMI 24.61 kg/m   Patient is a well-developed, well-nourished female in no acute distress. Patient has normal mood and affect. Patient is alert and oriented to person, place, and time. Pupils are equal and round with synchronous movement. No injected sclera.   Cardiovascular: Regular rate and rhythm, with no murmurs, rubs, or gallops. Distal pulses palpable.  Respiratory: Lungs clear to auscultation bilaterally.   Left Knee: Soft tissue swelling: moderate Effusion: minimal Erythema: none Crepitance: mild Tenderness: lateral Alignment: relative valgus Mediolateral laxity: lateral pseudolaxity Posterior sag: negative Patellar tracking: Good tracking without evidence of subluxation or tilt Atrophy: No significant atrophy.  Quadriceps tone was fair to good. Range of motion: 0/15/104 degrees  X-rays No new radiographs were obtained today. Previous radiographs were reviewed of the left knee and revealed severe loss of joint space in the lateral compartment with significant osteophyte formation and subchondral sclerosis of the bone. Lateral view reveals bone-on-bone arthritis of the lateral and medial compartments  with significant posterior osteophyte formation. Patellofemoral joint remains reveals loss of joint space with osteophyte formation. No fracture is noted.  Impression:  1. Primary osteoarthritis of left knee M17.12   Plan:  The patient has end-stage degenerative changes of the left knee. It was explained to the patient that the condition is progressive in nature. Having failed conservative treatment, the patient has elected to proceed with a total joint arthroplasty. The patient will undergo a total joint arthroplasty with Dr. Marry Guan. The risks of surgery, including blood clot and infection, were discussed with the patient. Measures to reduce these risks, including the use of anticoagulation, perioperative antibiotics, and early ambulation were discussed. The importance of postoperative physical therapy was discussed with the patient. The patient elects to proceed with surgery. The patient is instructed to stop aspirin and Vitamin D prior to surgery. The patient is instructed to call the hospital the day before surgery to learn of the proper arrival time.   Contact our office with any questions or concerns. Follow up as indicated, or sooner should any new problems arise, if conditions worsen, or if they are otherwise concerned.   Gwenlyn Fudge, PA  [facility] [address], [place] [address] Phone: [phone]  This note was generated in part with voice recognition software and I apologize for any typographical errors that were not detected and corrected.  Electronically signed by Gwenlyn Fudge, PA at 08/14/2019 4:21 PM EDT

## 2019-08-19 ENCOUNTER — Encounter: Payer: Self-pay | Admitting: *Deleted

## 2019-08-19 ENCOUNTER — Observation Stay: Payer: Medicare Other

## 2019-08-19 ENCOUNTER — Other Ambulatory Visit: Payer: Self-pay

## 2019-08-19 ENCOUNTER — Inpatient Hospital Stay: Payer: Medicare Other | Admitting: Certified Registered"

## 2019-08-19 ENCOUNTER — Observation Stay
Admission: RE | Admit: 2019-08-19 | Discharge: 2019-08-21 | Disposition: A | Discharge status: Home / Self Care | Payer: Medicare Other | Attending: Orthopedic Surgery | Admitting: Orthopedic Surgery

## 2019-08-19 ENCOUNTER — Encounter
Admission: RE | Disposition: A | Discharge status: Home / Self Care | Payer: Self-pay | Source: Home / Self Care | Attending: Orthopedic Surgery

## 2019-08-19 DIAGNOSIS — Z85828 Personal history of other malignant neoplasm of skin: Secondary | ICD-10-CM | POA: Diagnosis not present

## 2019-08-19 DIAGNOSIS — M8580 Other specified disorders of bone density and structure, unspecified site: Secondary | ICD-10-CM | POA: Diagnosis not present

## 2019-08-19 DIAGNOSIS — M1990 Unspecified osteoarthritis, unspecified site: Secondary | ICD-10-CM | POA: Insufficient documentation

## 2019-08-19 DIAGNOSIS — Z96659 Presence of unspecified artificial knee joint: Principal | ICD-10-CM

## 2019-08-19 DIAGNOSIS — I10 Essential (primary) hypertension: Secondary | ICD-10-CM | POA: Insufficient documentation

## 2019-08-19 DIAGNOSIS — E039 Hypothyroidism, unspecified: Secondary | ICD-10-CM | POA: Diagnosis not present

## 2019-08-19 DIAGNOSIS — E663 Overweight: Secondary | ICD-10-CM | POA: Insufficient documentation

## 2019-08-19 DIAGNOSIS — Z90710 Acquired absence of both cervix and uterus: Secondary | ICD-10-CM | POA: Insufficient documentation

## 2019-08-19 DIAGNOSIS — R7303 Prediabetes: Secondary | ICD-10-CM | POA: Insufficient documentation

## 2019-08-19 DIAGNOSIS — H9193 Unspecified hearing loss, bilateral: Secondary | ICD-10-CM | POA: Insufficient documentation

## 2019-08-19 DIAGNOSIS — Z888 Allergy status to other drugs, medicaments and biological substances status: Secondary | ICD-10-CM | POA: Insufficient documentation

## 2019-08-19 DIAGNOSIS — Z9842 Cataract extraction status, left eye: Secondary | ICD-10-CM | POA: Diagnosis not present

## 2019-08-19 DIAGNOSIS — Z7982 Long term (current) use of aspirin: Secondary | ICD-10-CM | POA: Insufficient documentation

## 2019-08-19 DIAGNOSIS — M1712 Unilateral primary osteoarthritis, left knee: Secondary | ICD-10-CM | POA: Diagnosis present

## 2019-08-19 DIAGNOSIS — Z859 Personal history of malignant neoplasm, unspecified: Secondary | ICD-10-CM | POA: Diagnosis not present

## 2019-08-19 DIAGNOSIS — Z791 Long term (current) use of non-steroidal anti-inflammatories (NSAID): Secondary | ICD-10-CM | POA: Insufficient documentation

## 2019-08-19 DIAGNOSIS — Z833 Family history of diabetes mellitus: Secondary | ICD-10-CM | POA: Insufficient documentation

## 2019-08-19 DIAGNOSIS — Z9841 Cataract extraction status, right eye: Secondary | ICD-10-CM | POA: Insufficient documentation

## 2019-08-19 DIAGNOSIS — Z79899 Other long term (current) drug therapy: Secondary | ICD-10-CM | POA: Diagnosis not present

## 2019-08-19 DIAGNOSIS — I739 Peripheral vascular disease, unspecified: Secondary | ICD-10-CM | POA: Insufficient documentation

## 2019-08-19 DIAGNOSIS — F419 Anxiety disorder, unspecified: Secondary | ICD-10-CM | POA: Insufficient documentation

## 2019-08-19 DIAGNOSIS — Z8249 Family history of ischemic heart disease and other diseases of the circulatory system: Secondary | ICD-10-CM | POA: Insufficient documentation

## 2019-08-19 DIAGNOSIS — K219 Gastro-esophageal reflux disease without esophagitis: Secondary | ICD-10-CM | POA: Diagnosis not present

## 2019-08-19 DIAGNOSIS — Z9109 Other allergy status, other than to drugs and biological substances: Secondary | ICD-10-CM | POA: Insufficient documentation

## 2019-08-19 DIAGNOSIS — Z7901 Long term (current) use of anticoagulants: Secondary | ICD-10-CM | POA: Insufficient documentation

## 2019-08-19 DIAGNOSIS — E785 Hyperlipidemia, unspecified: Secondary | ICD-10-CM | POA: Diagnosis not present

## 2019-08-19 DIAGNOSIS — Z6825 Body mass index (BMI) 25.0-25.9, adult: Secondary | ICD-10-CM | POA: Insufficient documentation

## 2019-08-19 DIAGNOSIS — H8110 Benign paroxysmal vertigo, unspecified ear: Secondary | ICD-10-CM | POA: Insufficient documentation

## 2019-08-19 HISTORY — PX: KNEE ARTHROPLASTY: SHX992

## 2019-08-19 LAB — ABO/RH: ABO/RH(D): A NEG

## 2019-08-19 SURGERY — ARTHROPLASTY, KNEE, TOTAL, USING IMAGELESS COMPUTER-ASSISTED NAVIGATION
Anesthesia: Spinal | Site: Knee | Laterality: Left

## 2019-08-19 MED ORDER — ALPRAZOLAM 0.25 MG PO TABS: 0.1250 mg | ORAL_TABLET | Freq: Every day | ORAL | Status: DC | PRN

## 2019-08-19 MED ORDER — DIPHENHYDRAMINE HCL 12.5 MG/5ML PO ELIX: 12.5000 mg | ORAL_SOLUTION | ORAL | Status: DC | PRN

## 2019-08-19 MED ORDER — CEFAZOLIN SODIUM-DEXTROSE 2-4 GM/100ML-% IV SOLN
INTRAVENOUS | Status: AC
  Filled 2019-08-19: qty 100

## 2019-08-19 MED ORDER — HYDROCHLOROTHIAZIDE 12.5 MG PO CAPS
12.5000 mg | ORAL_CAPSULE | Freq: Every day | ORAL | Status: DC
  Administered 2019-08-21: 12.5 mg via ORAL
  Filled 2019-08-19: qty 1

## 2019-08-19 MED ORDER — BUPIVACAINE HCL (PF) 0.25 % IJ SOLN
INTRAMUSCULAR | Status: DC | PRN
  Administered 2019-08-19: 60 mL

## 2019-08-19 MED ORDER — BISACODYL 10 MG RE SUPP: 10.0000 mg | Freq: Every day | RECTAL | Status: DC | PRN

## 2019-08-19 MED ORDER — ACETAMINOPHEN 10 MG/ML IV SOLN
INTRAVENOUS | Status: AC
  Filled 2019-08-19: qty 100

## 2019-08-19 MED ORDER — MIDAZOLAM HCL 2 MG/2ML IJ SOLN
INTRAMUSCULAR | Status: AC
  Filled 2019-08-19: qty 2

## 2019-08-19 MED ORDER — ACETAMINOPHEN 10 MG/ML IV SOLN
1000.0000 mg | Freq: Four times a day (QID) | INTRAVENOUS | Status: AC
  Administered 2019-08-19 – 2019-08-20 (×3): 1000 mg via INTRAVENOUS
  Filled 2019-08-19 (×4): qty 100

## 2019-08-19 MED ORDER — ONDANSETRON HCL 4 MG PO TABS: 4.0000 mg | ORAL_TABLET | Freq: Four times a day (QID) | ORAL | Status: DC | PRN

## 2019-08-19 MED ORDER — DEXAMETHASONE SODIUM PHOSPHATE 10 MG/ML IJ SOLN
8.0000 mg | Freq: Once | INTRAMUSCULAR | Status: AC
  Administered 2019-08-19: 11:00:00 8 mg via INTRAVENOUS

## 2019-08-19 MED ORDER — ENOXAPARIN SODIUM 30 MG/0.3ML ~~LOC~~ SOLN
30.0000 mg | Freq: Two times a day (BID) | SUBCUTANEOUS | Status: DC
  Administered 2019-08-20 – 2019-08-21 (×3): 30 mg via SUBCUTANEOUS
  Filled 2019-08-19 (×3): qty 0.3

## 2019-08-19 MED ORDER — AMLODIPINE BESYLATE 5 MG PO TABS
2.5000 mg | ORAL_TABLET | Freq: Every day | ORAL | Status: DC
  Administered 2019-08-19 – 2019-08-21 (×2): 2.5 mg via ORAL
  Filled 2019-08-19 (×2): qty 1

## 2019-08-19 MED ORDER — SODIUM CHLORIDE 0.9 % IV SOLN
INTRAVENOUS | Status: DC
  Administered 2019-08-19 – 2019-08-20 (×2): via INTRAVENOUS

## 2019-08-19 MED ORDER — GABAPENTIN 300 MG PO CAPS
ORAL_CAPSULE | ORAL | Status: AC
  Administered 2019-08-19: 300 mg via ORAL
  Filled 2019-08-19: qty 1

## 2019-08-19 MED ORDER — CEFAZOLIN SODIUM-DEXTROSE 2-4 GM/100ML-% IV SOLN
2.0000 g | INTRAVENOUS | Status: AC
  Administered 2019-08-19: 2 g via INTRAVENOUS

## 2019-08-19 MED ORDER — VITAMIN D3 25 MCG (1000 UNIT) PO TABS
2000.0000 [IU] | ORAL_TABLET | Freq: Every day | ORAL | Status: DC
  Administered 2019-08-20 – 2019-08-21 (×2): 2000 [IU] via ORAL
  Filled 2019-08-19 (×3): qty 2

## 2019-08-19 MED ORDER — FERROUS SULFATE 325 (65 FE) MG PO TABS
325.0000 mg | ORAL_TABLET | Freq: Two times a day (BID) | ORAL | Status: DC
  Administered 2019-08-20 – 2019-08-21 (×3): 325 mg via ORAL
  Filled 2019-08-19 (×3): qty 1

## 2019-08-19 MED ORDER — METOCLOPRAMIDE HCL 10 MG PO TABS
10.0000 mg | ORAL_TABLET | Freq: Three times a day (TID) | ORAL | Status: DC
  Administered 2019-08-19 – 2019-08-21 (×6): 10 mg via ORAL
  Filled 2019-08-19 (×6): qty 1

## 2019-08-19 MED ORDER — MENTHOL 3 MG MT LOZG
1.0000 | LOZENGE | OROMUCOSAL | Status: DC | PRN
  Filled 2019-08-19: qty 9

## 2019-08-19 MED ORDER — CHLORHEXIDINE GLUCONATE 4 % EX LIQD: 60.0000 mL | Freq: Once | CUTANEOUS | Status: DC

## 2019-08-19 MED ORDER — SODIUM CHLORIDE 0.9 % IV SOLN
INTRAVENOUS | Status: DC | PRN
  Administered 2019-08-19: 60 mL

## 2019-08-19 MED ORDER — PROPOFOL 500 MG/50ML IV EMUL
INTRAVENOUS | Status: DC | PRN
  Administered 2019-08-19: 50 ug/kg/min via INTRAVENOUS

## 2019-08-19 MED ORDER — ACETAMINOPHEN 10 MG/ML IV SOLN
INTRAVENOUS | Status: DC | PRN
  Administered 2019-08-19: 1000 mg via INTRAVENOUS

## 2019-08-19 MED ORDER — FENTANYL CITRATE (PF) 100 MCG/2ML IJ SOLN: 25.0000 ug | INTRAMUSCULAR | Status: DC | PRN

## 2019-08-19 MED ORDER — OXYCODONE HCL 5 MG PO TABS
5.0000 mg | ORAL_TABLET | ORAL | Status: DC | PRN
  Administered 2019-08-19: 5 mg via ORAL
  Filled 2019-08-19 (×2): qty 1

## 2019-08-19 MED ORDER — NEOMYCIN-POLYMYXIN B GU 40-200000 IR SOLN
Status: DC | PRN
  Administered 2019-08-19: 14 mL

## 2019-08-19 MED ORDER — HYDROMORPHONE HCL 1 MG/ML IJ SOLN: 0.5000 mg | INTRAMUSCULAR | Status: DC | PRN

## 2019-08-19 MED ORDER — GABAPENTIN 300 MG PO CAPS
300.0000 mg | ORAL_CAPSULE | Freq: Once | ORAL | Status: AC
  Administered 2019-08-19: 11:00:00 300 mg via ORAL

## 2019-08-19 MED ORDER — FAMOTIDINE 20 MG PO TABS
20.0000 mg | ORAL_TABLET | Freq: Once | ORAL | Status: AC
  Administered 2019-08-19: 11:00:00 20 mg via ORAL

## 2019-08-19 MED ORDER — PHENOL 1.4 % MT LIQD
1.0000 | OROMUCOSAL | Status: DC | PRN
  Filled 2019-08-19: qty 177

## 2019-08-19 MED ORDER — RISAQUAD PO CAPS
1.0000 | ORAL_CAPSULE | Freq: Every day | ORAL | Status: DC
  Administered 2019-08-20: 1 via ORAL
  Filled 2019-08-19: qty 1

## 2019-08-19 MED ORDER — DEXAMETHASONE SODIUM PHOSPHATE 10 MG/ML IJ SOLN
INTRAMUSCULAR | Status: AC
  Administered 2019-08-19: 8 mg via INTRAVENOUS
  Filled 2019-08-19: qty 1

## 2019-08-19 MED ORDER — CELECOXIB 200 MG PO CAPS
ORAL_CAPSULE | ORAL | Status: AC
  Administered 2019-08-19: 400 mg via ORAL
  Filled 2019-08-19: qty 2

## 2019-08-19 MED ORDER — PANTOPRAZOLE SODIUM 40 MG PO TBEC
40.0000 mg | DELAYED_RELEASE_TABLET | Freq: Two times a day (BID) | ORAL | Status: DC
  Administered 2019-08-19 – 2019-08-20 (×3): 40 mg via ORAL
  Filled 2019-08-19 (×4): qty 1

## 2019-08-19 MED ORDER — GABAPENTIN 300 MG PO CAPS
300.0000 mg | ORAL_CAPSULE | Freq: Every day | ORAL | Status: DC
  Administered 2019-08-19 – 2019-08-20 (×2): 300 mg via ORAL
  Filled 2019-08-19 (×2): qty 1

## 2019-08-19 MED ORDER — BUPIVACAINE HCL (PF) 0.5 % IJ SOLN
INTRAMUSCULAR | Status: DC | PRN
  Administered 2019-08-19: 3 mL

## 2019-08-19 MED ORDER — PHENYLEPHRINE HCL (PRESSORS) 10 MG/ML IV SOLN
INTRAVENOUS | Status: DC | PRN
  Administered 2019-08-19 (×6): 100 ug via INTRAVENOUS

## 2019-08-19 MED ORDER — PROPOFOL 10 MG/ML IV BOLUS
INTRAVENOUS | Status: AC
  Filled 2019-08-19: qty 20

## 2019-08-19 MED ORDER — CELECOXIB 200 MG PO CAPS
200.0000 mg | ORAL_CAPSULE | Freq: Two times a day (BID) | ORAL | Status: DC
  Administered 2019-08-19 – 2019-08-21 (×4): 200 mg via ORAL
  Filled 2019-08-19 (×4): qty 1

## 2019-08-19 MED ORDER — SODIUM CHLORIDE 0.9 % IV SOLN: INTRAVENOUS | Status: DC | PRN

## 2019-08-19 MED ORDER — ALUM & MAG HYDROXIDE-SIMETH 200-200-20 MG/5ML PO SUSP: 30.0000 mL | ORAL | Status: DC | PRN

## 2019-08-19 MED ORDER — ACETAMINOPHEN 325 MG PO TABS: 325.0000 mg | ORAL_TABLET | Freq: Four times a day (QID) | ORAL | Status: DC | PRN

## 2019-08-19 MED ORDER — TRAMADOL HCL 50 MG PO TABS
50.0000 mg | ORAL_TABLET | ORAL | Status: DC | PRN
  Administered 2019-08-19: 100 mg via ORAL
  Filled 2019-08-19 (×2): qty 2

## 2019-08-19 MED ORDER — OXYCODONE HCL 5 MG PO TABS
10.0000 mg | ORAL_TABLET | ORAL | Status: DC | PRN
  Administered 2019-08-20 – 2019-08-21 (×3): 10 mg via ORAL
  Filled 2019-08-19 (×3): qty 2

## 2019-08-19 MED ORDER — ONDANSETRON HCL 4 MG/2ML IJ SOLN: 4.0000 mg | Freq: Four times a day (QID) | INTRAMUSCULAR | Status: DC | PRN

## 2019-08-19 MED ORDER — METOCLOPRAMIDE HCL 5 MG/ML IJ SOLN: 5.0000 mg | Freq: Three times a day (TID) | INTRAMUSCULAR | Status: DC | PRN

## 2019-08-19 MED ORDER — PROPOFOL 500 MG/50ML IV EMUL
INTRAVENOUS | Status: AC
  Filled 2019-08-19: qty 50

## 2019-08-19 MED ORDER — SENNOSIDES-DOCUSATE SODIUM 8.6-50 MG PO TABS
1.0000 | ORAL_TABLET | Freq: Two times a day (BID) | ORAL | Status: DC
  Administered 2019-08-19 – 2019-08-20 (×2): 1 via ORAL
  Filled 2019-08-19 (×4): qty 1

## 2019-08-19 MED ORDER — FENTANYL CITRATE (PF) 100 MCG/2ML IJ SOLN
INTRAMUSCULAR | Status: DC | PRN
  Administered 2019-08-19 (×2): 50 ug via INTRAVENOUS

## 2019-08-19 MED ORDER — OMEGA-3-ACID ETHYL ESTERS 1 G PO CAPS
1.0000 g | ORAL_CAPSULE | Freq: Every day | ORAL | Status: DC
  Administered 2019-08-20 – 2019-08-21 (×2): 1 g via ORAL
  Filled 2019-08-19 (×2): qty 1

## 2019-08-19 MED ORDER — ENSURE PRE-SURGERY PO LIQD
296.0000 mL | Freq: Once | ORAL | Status: DC
  Filled 2019-08-19: qty 296

## 2019-08-19 MED ORDER — METOCLOPRAMIDE HCL 10 MG PO TABS: 5.0000 mg | ORAL_TABLET | Freq: Three times a day (TID) | ORAL | Status: DC | PRN

## 2019-08-19 MED ORDER — FENTANYL CITRATE (PF) 100 MCG/2ML IJ SOLN
INTRAMUSCULAR | Status: AC
  Filled 2019-08-19: qty 2

## 2019-08-19 MED ORDER — MAGNESIUM HYDROXIDE 400 MG/5ML PO SUSP
30.0000 mL | Freq: Every day | ORAL | Status: DC
  Administered 2019-08-19 – 2019-08-20 (×2): 30 mL via ORAL
  Filled 2019-08-19 (×3): qty 30

## 2019-08-19 MED ORDER — FAMOTIDINE 20 MG PO TABS
ORAL_TABLET | ORAL | Status: AC
  Administered 2019-08-19: 20 mg via ORAL
  Filled 2019-08-19: qty 1

## 2019-08-19 MED ORDER — LACTATED RINGERS IV SOLN
INTRAVENOUS | Status: DC
  Administered 2019-08-19: 11:00:00 via INTRAVENOUS

## 2019-08-19 MED ORDER — FLEET ENEMA 7-19 GM/118ML RE ENEM: 1.0000 | ENEMA | Freq: Once | RECTAL | Status: DC | PRN

## 2019-08-19 MED ORDER — ATORVASTATIN CALCIUM 20 MG PO TABS
20.0000 mg | ORAL_TABLET | Freq: Every day | ORAL | Status: DC
  Administered 2019-08-19 – 2019-08-20 (×2): 20 mg via ORAL
  Filled 2019-08-19 (×2): qty 1

## 2019-08-19 MED ORDER — SODIUM CHLORIDE 0.9 % IV SOLN
INTRAVENOUS | Status: DC | PRN
  Administered 2019-08-19: 14:00:00 30 ug/min via INTRAVENOUS

## 2019-08-19 MED ORDER — ACYCLOVIR 200 MG PO CAPS
400.0000 mg | ORAL_CAPSULE | Freq: Every day | ORAL | Status: DC
  Administered 2019-08-19 – 2019-08-20 (×2): 400 mg via ORAL
  Filled 2019-08-19 (×3): qty 2

## 2019-08-19 MED ORDER — MIDAZOLAM HCL 5 MG/5ML IJ SOLN
INTRAMUSCULAR | Status: DC | PRN
  Administered 2019-08-19 (×2): 1 mg via INTRAVENOUS

## 2019-08-19 MED ORDER — TRANEXAMIC ACID-NACL 1000-0.7 MG/100ML-% IV SOLN
1000.0000 mg | Freq: Once | INTRAVENOUS | Status: AC
  Administered 2019-08-19: 1000 mg via INTRAVENOUS
  Filled 2019-08-19: qty 100

## 2019-08-19 MED ORDER — TRANEXAMIC ACID-NACL 1000-0.7 MG/100ML-% IV SOLN
1000.0000 mg | INTRAVENOUS | Status: AC
  Administered 2019-08-19: 1000 mg via INTRAVENOUS
  Filled 2019-08-19: qty 100

## 2019-08-19 MED ORDER — CEFAZOLIN SODIUM-DEXTROSE 2-4 GM/100ML-% IV SOLN
2.0000 g | Freq: Four times a day (QID) | INTRAVENOUS | Status: AC
  Administered 2019-08-19 – 2019-08-20 (×4): 2 g via INTRAVENOUS
  Filled 2019-08-19 (×5): qty 100

## 2019-08-19 MED ORDER — ONDANSETRON HCL 4 MG/2ML IJ SOLN: 4.0000 mg | Freq: Once | INTRAMUSCULAR | Status: DC | PRN

## 2019-08-19 MED ORDER — CELECOXIB 200 MG PO CAPS
400.0000 mg | ORAL_CAPSULE | Freq: Once | ORAL | Status: AC
  Administered 2019-08-19: 11:00:00 400 mg via ORAL

## 2019-08-19 MED ORDER — LEVOTHYROXINE SODIUM 50 MCG PO TABS
50.0000 ug | ORAL_TABLET | Freq: Every day | ORAL | Status: DC
  Administered 2019-08-20 – 2019-08-21 (×2): 50 ug via ORAL
  Filled 2019-08-19 (×2): qty 1

## 2019-08-19 SURGICAL SUPPLY — 72 items
ATTUNE PSFEM LTSZ6 NARCEM KNEE (Femur) ×2 IMPLANT
ATTUNE PSRP INSR SZ6 5 KNEE (Insert) ×1 IMPLANT
ATTUNE PSRP INSR SZ6 5MM KNEE (Insert) ×1 IMPLANT
BASEPLATE TIBIAL ROTATING SZ 4 (Knees) ×2 IMPLANT
BATTERY INSTRU NAVIGATION (MISCELLANEOUS) ×12 IMPLANT
BLADE SAW 70X12.5 (BLADE) ×3 IMPLANT
BLADE SAW 90X13X1.19 OSCILLAT (BLADE) ×3 IMPLANT
BLADE SAW 90X25X1.19 OSCILLAT (BLADE) ×3 IMPLANT
CANISTER SUCT 3000ML PPV (MISCELLANEOUS) ×3 IMPLANT
CEMENT HV SMART SET (Cement) ×6 IMPLANT
COOLER POLAR GLACIER W/PUMP (MISCELLANEOUS) ×3 IMPLANT
COVER WAND RF STERILE (DRAPES) ×3 IMPLANT
CUFF TOURN SGL QUICK 24 (TOURNIQUET CUFF) ×2
CUFF TOURN SGL QUICK 30 (TOURNIQUET CUFF)
CUFF TRNQT CYL 24X4X16.5-23 (TOURNIQUET CUFF) IMPLANT
CUFF TRNQT CYL 30X4X21-28X (TOURNIQUET CUFF) IMPLANT
DRAPE 3/4 80X56 (DRAPES) ×3 IMPLANT
DRSG DERMACEA 8X12 NADH (GAUZE/BANDAGES/DRESSINGS) ×3 IMPLANT
DRSG OPSITE POSTOP 4X14 (GAUZE/BANDAGES/DRESSINGS) ×3 IMPLANT
DRSG TEGADERM 4X4.75 (GAUZE/BANDAGES/DRESSINGS) ×3 IMPLANT
DURAPREP 26ML APPLICATOR (WOUND CARE) ×6 IMPLANT
ELECT REM PT RETURN 9FT ADLT (ELECTROSURGICAL) ×3
ELECTRODE REM PT RTRN 9FT ADLT (ELECTROSURGICAL) ×1 IMPLANT
EX-PIN ORTHOLOCK NAV 4X150 (PIN) ×6 IMPLANT
GLOVE BIO SURGEON STRL SZ7.5 (GLOVE) ×6 IMPLANT
GLOVE BIOGEL M STRL SZ7.5 (GLOVE) ×6 IMPLANT
GLOVE BIOGEL PI IND STRL 7.5 (GLOVE) ×1 IMPLANT
GLOVE BIOGEL PI INDICATOR 7.5 (GLOVE) ×2
GLOVE INDICATOR 8.0 STRL GRN (GLOVE) ×3 IMPLANT
GOWN STRL REUS W/ TWL LRG LVL3 (GOWN DISPOSABLE) ×2 IMPLANT
GOWN STRL REUS W/ TWL XL LVL3 (GOWN DISPOSABLE) ×1 IMPLANT
GOWN STRL REUS W/TWL LRG LVL3 (GOWN DISPOSABLE) ×4
GOWN STRL REUS W/TWL XL LVL3 (GOWN DISPOSABLE) ×2
HEMOVAC 400CC 10FR (MISCELLANEOUS) ×3 IMPLANT
HOLDER FOLEY CATH W/STRAP (MISCELLANEOUS) ×3 IMPLANT
HOOD PEEL AWAY FLYTE STAYCOOL (MISCELLANEOUS) ×6 IMPLANT
KIT TURNOVER KIT A (KITS) ×3 IMPLANT
KNIFE SCULPS 14X20 (INSTRUMENTS) ×3 IMPLANT
LABEL OR SOLS (LABEL) ×3 IMPLANT
MANIFOLD NEPTUNE II (INSTRUMENTS) ×3 IMPLANT
NDL SAFETY ECLIPSE 18X1.5 (NEEDLE) ×1 IMPLANT
NDL SPNL 20GX3.5 QUINCKE YW (NEEDLE) ×2 IMPLANT
NEEDLE HYPO 18GX1.5 SHARP (NEEDLE) ×2
NEEDLE SPNL 20GX3.5 QUINCKE YW (NEEDLE) ×6 IMPLANT
NS IRRIG 500ML POUR BTL (IV SOLUTION) ×3 IMPLANT
PACK TOTAL KNEE (MISCELLANEOUS) ×3 IMPLANT
PAD WRAPON POLAR KNEE (MISCELLANEOUS) ×1 IMPLANT
PATELLA MEDIAL ATTUN 35MM KNEE (Knees) ×2 IMPLANT
PENCIL SMOKE ULTRAEVAC 22 CON (MISCELLANEOUS) ×3 IMPLANT
PIN DRILL QUICK PACK ×3 IMPLANT
PIN FIXATION 1/8DIA X 3INL (PIN) ×9 IMPLANT
PULSAVAC PLUS IRRIG FAN TIP (DISPOSABLE) ×3
SOL .9 NS 3000ML IRR  AL (IV SOLUTION) ×2
SOL .9 NS 3000ML IRR UROMATIC (IV SOLUTION) ×1 IMPLANT
SOL PREP PVP 2OZ (MISCELLANEOUS) ×3
SOLUTION PREP PVP 2OZ (MISCELLANEOUS) ×1 IMPLANT
SPONGE DRAIN TRACH 4X4 STRL 2S (GAUZE/BANDAGES/DRESSINGS) ×3 IMPLANT
STAPLER SKIN PROX 35W (STAPLE) ×3 IMPLANT
STOCKINETTE IMPERV 14X48 (MISCELLANEOUS) IMPLANT
STRAP TIBIA SHORT (MISCELLANEOUS) ×3 IMPLANT
SUCTION FRAZIER HANDLE 10FR (MISCELLANEOUS) ×2
SUCTION TUBE FRAZIER 10FR DISP (MISCELLANEOUS) ×1 IMPLANT
SUT VIC AB 0 CT1 36 (SUTURE) ×3 IMPLANT
SUT VIC AB 1 CT1 36 (SUTURE) ×6 IMPLANT
SUT VIC AB 2-0 CT2 27 (SUTURE) ×3 IMPLANT
SYR 20ML LL LF (SYRINGE) ×3 IMPLANT
SYR 30ML LL (SYRINGE) ×6 IMPLANT
TIP FAN IRRIG PULSAVAC PLUS (DISPOSABLE) ×1 IMPLANT
TOWEL OR 17X26 4PK STRL BLUE (TOWEL DISPOSABLE) ×3 IMPLANT
TOWER CARTRIDGE SMART MIX (DISPOSABLE) ×3 IMPLANT
TRAY FOLEY MTR SLVR 16FR STAT (SET/KITS/TRAYS/PACK) ×3 IMPLANT
WRAPON POLAR PAD KNEE (MISCELLANEOUS) ×3

## 2019-08-19 NOTE — H&P (Signed)
The patient has been re-examined, and the chart reviewed, and there have been no interval changes to the documented history and physical.    The risks, benefits, and alternatives have been discussed at length. The patient expressed understanding of the risks benefits and agreed with plans for surgical intervention.  James P. Hooten, [name] M.D.    

## 2019-08-19 NOTE — Transfer of Care (Signed)
Immediate Anesthesia Transfer of Care Note  Patient: Mary Pacheco  Procedure(s) Performed: COMPUTER ASSISTED TOTAL KNEE ARTHROPLASTY (Left Knee)  Patient Location: PACU  Anesthesia Type:General  Level of Consciousness: awake, alert  and oriented  Airway & Oxygen Therapy: Patient Spontanous Breathing and Patient connected to nasal cannula oxygen  Post-op Assessment: Report given to RN and Post -op Vital signs reviewed and stable  Post vital signs: Reviewed and stable  Last Vitals:  Vitals Value Taken Time  BP    Temp    Pulse    Resp    SpO2      Last Pain:  Vitals:   08/19/19 1028  TempSrc: Tympanic  PainSc: 5       Patients Stated Pain Goal: 0 ([id number])  Complications: No apparent anesthesia complications

## 2019-08-19 NOTE — Op Note (Signed)
OPERATIVE NOTE  DATE OF SURGERY:  08/19/2019  PATIENT NAME:  Mary Pacheco   DOB: 1941/07/19  MRN: [id number]  PRE-OPERATIVE DIAGNOSIS: Degenerative arthrosis of the left knee, primary  POST-OPERATIVE DIAGNOSIS:  Same  PROCEDURE:  Left total knee arthroplasty using computer-assisted navigation  SURGEON:  Marciano Sequin. M.D.  ASSISTANT: Cassell Smiles, PA-C (present and scrubbed throughout the case, critical for assistance with exposure, retraction, instrumentation, and closure)  ANESTHESIA: spinal  ESTIMATED BLOOD LOSS: 50 mL  FLUIDS REPLACED: 1000 mL of crystalloid  TOURNIQUET TIME: 98 minutes  DRAINS: 2 medium Hemovac drains  SOFT TISSUE RELEASES: Anterior cruciate ligament, posterior cruciate ligament, deep medial collateral ligament, patellofemoral ligament  IMPLANTS UTILIZED: DePuy Attune size 6N posterior stabilized femoral component (cemented), size 4 rotating platform tibial component (cemented), 35 mm medialized dome patella (cemented), and a 5 mm stabilized rotating platform polyethylene insert.  INDICATIONS FOR SURGERY: Mary Pacheco is a 78 y.o. year old female with a long history of progressive knee pain. X-rays demonstrated severe degenerative changes in tricompartmental fashion. The patient had not seen any significant improvement despite conservative nonsurgical intervention. After discussion of the risks and benefits of surgical intervention, the patient expressed understanding of the risks benefits and agree with plans for total knee arthroplasty.   The risks, benefits, and alternatives were discussed at length including but not limited to the risks of infection, bleeding, nerve injury, stiffness, blood clots, the need for revision surgery, cardiopulmonary complications, among others, and they were willing to proceed.  PROCEDURE IN DETAIL: The patient was brought into the operating room and, after adequate spinal anesthesia was achieved, a tourniquet was placed  on the patient's upper thigh. The patient's knee and leg were cleaned and prepped with alcohol and DuraPrep and draped in the usual sterile fashion. A "timeout" was performed as per usual protocol. The lower extremity was exsanguinated using an Esmarch, and the tourniquet was inflated to 300 mmHg. An anterior longitudinal incision was made followed by a standard mid vastus approach. The deep fibers of the medial collateral ligament were elevated in a subperiosteal fashion off of the medial flare of the tibia so as to maintain a continuous soft tissue sleeve. The patella was subluxed laterally and the patellofemoral ligament was incised. Inspection of the knee demonstrated severe degenerative changes with full-thickness loss of articular cartilage. Osteophytes were debrided using a rongeur. Anterior and posterior cruciate ligaments were excised. Two 4.0 mm Schanz pins were inserted in the femur and into the tibia for attachment of the array of trackers used for computer-assisted navigation. Hip center was identified using a circumduction technique. Distal landmarks were mapped using the computer. The distal femur and proximal tibia were mapped using the computer. The distal femoral cutting guide was positioned using computer-assisted navigation so as to achieve a 5 distal valgus cut. The femur was sized and it was felt that a size 6N femoral component was appropriate. A size 6 femoral cutting guide was positioned and the anterior cut was performed and verified using the computer. This was followed by completion of the posterior and chamfer cuts. Femoral cutting guide for the central box was then positioned in the center box cut was performed.  Attention was then directed to the proximal tibia. Medial and lateral menisci were excised. The extramedullary tibial cutting guide was positioned using computer-assisted navigation so as to achieve a 0 varus-valgus alignment and 3 posterior slope. The cut was performed  and verified using the computer. The proximal tibia was  sized and it was felt that a size 4 tibial tray was appropriate. Tibial and femoral trials were inserted followed by insertion of a 5 mm polyethylene insert.  The knee was felt to be tight both in flexion and in extension.  The trial components were removed and the extra medullary tibial cutting guide was repositioned so as to resect an additional 2 mm of bone.  The cut was performed and verified using the computer.  Trial components were reinserted.  This allowed for excellent mediolateral soft tissue balancing both in flexion and in full extension. Finally, the patella was cut and prepared so as to accommodate a 35 mm medialized dome patella. A patella trial was placed and the knee was placed through a range of motion with excellent patellar tracking appreciated. The femoral trial was removed after debridement of posterior osteophytes. The central post-hole for the tibial component was reamed followed by insertion of a keel punch. Tibial trials were then removed. Cut surfaces of bone were irrigated with copious amounts of normal saline with antibiotic solution using pulsatile lavage and then suctioned dry. Polymethylmethacrylate cement was prepared in the usual fashion using a vacuum mixer. Cement was applied to the cut surface of the proximal tibia as well as along the undersurface of a size 4 rotating platform tibial component. Tibial component was positioned and impacted into place. Excess cement was removed using [employer] fast streamer. Cement was then applied to the cut surfaces of the femur as well as along the posterior flanges of the size 6N femoral component. The femoral component was positioned and impacted into place. Excess cement was removed using [employer] fast streamer. A 5 mm polyethylene trial was inserted and the knee was brought into full extension with steady axial compression applied. Finally, cement was applied to the backside of a 35 mm medialized  dome patella and the patellar component was positioned and patellar clamp applied. Excess cement was removed using [employer] fast streamer. After adequate curing of the cement, the tourniquet was deflated after a total tourniquet time of 98 minutes. Hemostasis was achieved using electrocautery. The knee was irrigated with copious amounts of normal saline with antibiotic solution using pulsatile lavage and then suctioned dry. 20 mL of 1.3% Exparel and 60 mL of 0.25% Marcaine in 40 mL of normal saline was injected along the posterior capsule, medial and lateral gutters, and along the arthrotomy site. A 5 mm stabilized rotating platform polyethylene insert was inserted and the knee was placed through a range of motion with excellent mediolateral soft tissue balancing appreciated and excellent patellar tracking noted. 2 medium drains were placed in the wound bed and brought out through separate stab incisions. The medial parapatellar portion of the incision was reapproximated using interrupted sutures of #1 Vicryl. Subcutaneous tissue was approximated in layers using first #0 Vicryl followed #2-0 Vicryl. The skin was approximated with skin staples. A sterile dressing was applied.  The patient tolerated the procedure well and was transported to the recovery room in stable condition.    Saron Vanorman P. Holley Bouche., M.D.

## 2019-08-19 NOTE — Plan of Care (Signed)

## 2019-08-19 NOTE — Anesthesia Preprocedure Evaluation (Signed)
Anesthesia Evaluation  Patient identified by MRN, date of birth, ID band Patient awake    Reviewed: Allergy & Precautions, H&P , NPO status , Patient's Chart, lab work & pertinent test results, reviewed documented beta blocker date and time   History of Anesthesia Complications Negative for: history of anesthetic complications  Airway Mallampati: I  TM Distance: >3 FB Neck ROM: full    Dental  (+) Implants, Dental Advidsory Given, Teeth Intact, Missing Permanent bridge on the right upper:   Pulmonary neg shortness of breath, asthma (as a child) , neg COPD, neg recent URI,    Pulmonary exam normal        Cardiovascular Exercise Tolerance: Good hypertension, (-) angina(-) Past MI and (-) Cardiac Stents Normal cardiovascular exam(-) dysrhythmias (-) Valvular Problems/Murmurs     Neuro/Psych PSYCHIATRIC DISORDERS Anxiety negative neurological ROS     GI/Hepatic Neg liver ROS, GERD  ,  Endo/Other  diabetes (borderline)Hypothyroidism   Renal/GU negative Renal ROS  negative genitourinary   Musculoskeletal   Abdominal   Peds  Hematology negative hematology ROS (+)   Anesthesia Other Findings Past Medical History: 2011: Anxiety     Comment:  patient with anxiety will continue Alprazolam prn No date: Arthritis No date: GERD (gastroesophageal reflux disease)     Comment:  pt. with esophagea reflux. She reports great improvement              with Dexilant. No date: Hearing loss     Comment:  s/p hearing aids 02/20/2010: Herpes     Comment:  gential No date: Hyperlipidemia     Comment:  patient with HL on statins. lipids well controlled No date: Hypertension No date: Hypothyroidism No date: Pre-diabetes No date: Skin cancer   Reproductive/Obstetrics negative OB ROS                             Anesthesia Physical Anesthesia Plan  ASA: II  Anesthesia Plan: Spinal   Post-op Pain Management:     Induction: Intravenous  PONV Risk Score and Plan: 2 and Propofol infusion and TIVA  Airway Management Planned: Natural Airway and Simple Face Mask  Additional Equipment:   Intra-op Plan:   Post-operative Plan:   Informed Consent: I have reviewed the patients History and Physical, chart, labs and discussed the procedure including the risks, benefits and alternatives for the proposed anesthesia with the patient or authorized representative who has indicated his/her understanding and acceptance.     Dental Advisory Given  Plan Discussed with: Anesthesiologist, CRNA and Surgeon  Anesthesia Plan Comments:         Anesthesia Quick Evaluation

## 2019-08-19 NOTE — Anesthesia Procedure Notes (Signed)
Spinal  Patient location during procedure: OR Start time: 08/19/2019 11:45 AM End time: 08/19/2019 12:09 PM Staffing Anesthesiologist: Martha Clan, MD Resident/CRNA: Samina Weekes, Einar Grad, CRNA Performed: resident/CRNA and anesthesiologist  Preanesthetic Checklist Completed: patient identified, site marked, surgical consent, pre-op evaluation, timeout performed, IV checked, risks and benefits discussed and monitors and equipment checked Spinal Block Patient position: sitting Prep: DuraPrep Patient monitoring: heart rate, cardiac monitor, continuous pulse ox and blood pressure Approach: midline Location: L3-4 Injection technique: single-shot Needle Needle type: Whitacre and Introducer  Needle gauge: 24 G Needle length: 9 cm Assessment Sensory level: T10 Additional Notes Expiration date of kit checked and within date.  IV placement verified and timeout performed prior to starting procedure.  Negative blood and negative paresthesia.  Clear CSF flow prior to injection of medication.  Patient tolerated the procedure well without complications.

## 2019-08-19 NOTE — Anesthesia Post-op Follow-up Note (Signed)
Anesthesia QCDR form completed.        

## 2019-08-20 ENCOUNTER — Encounter: Payer: Self-pay | Admitting: Orthopedic Surgery

## 2019-08-20 DIAGNOSIS — M1712 Unilateral primary osteoarthritis, left knee: Secondary | ICD-10-CM | POA: Diagnosis not present

## 2019-08-20 NOTE — Evaluation (Signed)
Physical Therapy Evaluation Patient Details Name: Mary Pacheco MRN: [id number] DOB: 05-23-1941 Today's Date: 08/20/2019   History of Present Illness  Pt is a 78 y/o F s/p L TKA (10/28) with PMH of asthma, HTN, GERD, DM and hypothyroidism. Pt was previously independent with no AD for community and household distances.  Clinical Impression  Pt is a pleasant 78 year old F who was admitted for elective L TKA (10/28). Pt performs bed mobility with mod I requiring extra time, transfers and ambulation with CGA for safety. Pt ambulates 150' with RW requiring CGA for safety, reporting feeling "shaky" in LLE, demonstrating narrow BOS. Cuing provided during transfers and gait for safe RW management, maintaining close proximity to RW and continuous forward propulsion vs. picking up RW. Pt demonstrates deficits with strength, range of motion, balance, requiring continued skilled PT intervention for improved functional mobility to promote return to prior level of function.    Follow Up Recommendations Home health PT    Equipment Recommendations  Rolling walker with 5" wheels    Recommendations for Other Services       Precautions / Restrictions Precautions Precautions: Fall Restrictions Weight Bearing Restrictions: Yes LLE Weight Bearing: Weight bearing as tolerated      Mobility  Bed Mobility Overal bed mobility: Needs Assistance Bed Mobility: Supine to Sit     Supine to sit: Modified independent (Device/Increase time)     General bed mobility comments: Requiring increased time  Transfers Overall transfer level: Needs assistance Equipment used: Rolling walker (2 wheeled) Transfers: Sit to/from Stand Sit to Stand: Min guard         General transfer comment: CGA for safety; once standing, pt reports feeling "shaky"  Ambulation/Gait Ambulation/Gait assistance: Min guard Gait Distance (Feet): 150 Feet Assistive device: Rolling walker (2 wheeled) Gait Pattern/deviations: Narrow  base of support;Step-through pattern     General Gait Details: Cues for continued forward propulsion of RW (vs. picking it up) and to maintain close proximity to RW with feet within base of RW  Stairs            Wheelchair Mobility    Modified Rankin (Stroke Patients Only)       Balance Overall balance assessment: Needs assistance Sitting-balance support: Feet supported Sitting balance-Leahy Scale: Good Sitting balance - Comments: Pt able to sit steady EOB and reach outside BOS   Standing balance support: No upper extremity supported Standing balance-Leahy Scale: Good Standing balance comment: Able to reach outside BOS while standing                             Pertinent Vitals/Pain Pain Assessment: 0-10 Pain Score: 0-No pain Pain Location: L knee Pain Intervention(s): Monitored during session    Home Living Family/patient expects to be discharged to:: Private residence Living Arrangements: Spouse/significant other Available Help at Discharge: Family;Available 24 hours/day(husband) Type of Home: House Home Access: Stairs to enter Entrance Stairs-Rails: Can reach both;Left;Right Entrance Stairs-Number of Steps: 3 Home Layout: One level Home Equipment: Walker - 2 wheels;Walker - 4 wheels;Cane - single point      Prior Function Level of Independence: Independent         Comments: for community and household ambulation without AD     Hand Dominance        Extremity/Trunk Assessment   Upper Extremity Assessment Upper Extremity Assessment: Overall WFL for tasks assessed    Lower Extremity Assessment Lower Extremity Assessment: Generalized weakness LLE Deficits /  Details: light touch sensation intact to LLE; L knee ROM: +5 to 85       Communication   Communication: No difficulties  Cognition Arousal/Alertness: Awake/alert Behavior During Therapy: WFL for tasks assessed/performed Overall Cognitive Status: Within Functional Limits for  tasks assessed                                        General Comments      Exercises Total Joint Exercises Ankle Circles/Pumps: AROM;Both;20 reps;Supine Quad Sets: Left;Strengthening;10 reps;Supine Heel Slides: AROM;Left;10 reps;Supine Marching in Standing: Strengthening;Both;20 reps;Standing   Assessment/Plan    PT Assessment Patient needs continued PT services  PT Problem List Decreased strength;Decreased mobility;Decreased safety awareness;Decreased range of motion;Decreased activity tolerance;Decreased balance;Pain       PT Treatment Interventions DME instruction;Therapeutic exercise;Gait training;Balance training;Neuromuscular re-education;Stair training;Functional mobility training;Therapeutic activities;Patient/family education    PT Goals (Current goals can be found in the Care Plan section)  Acute Rehab PT Goals Patient Stated Goal: to walk and go home PT Goal Formulation: With patient Time For Goal Achievement: 09/03/19 Potential to Achieve Goals: Good    Frequency BID   Barriers to discharge        Co-evaluation               AM-PAC PT "6 Clicks" Mobility  Outcome Measure Help needed turning from your back to your side while in a flat bed without using bedrails?: None Help needed moving from lying on your back to sitting on the side of a flat bed without using bedrails?: A Little Help needed moving to and from a bed to a chair (including a wheelchair)?: A Little Help needed standing up from a chair using your arms (e.g., wheelchair or bedside chair)?: A Little Help needed to walk in hospital room?: A Little Help needed climbing 3-5 steps with a railing? : A Little 6 Click Score: 19    End of Session Equipment Utilized During Treatment: Gait belt Activity Tolerance: Patient tolerated treatment well;No increased pain Patient left: in chair;with call bell/phone within reach;with chair alarm set;with SCD's reapplied   PT Visit  Diagnosis: Unsteadiness on feet (R26.81);Other abnormalities of gait and mobility (R26.89);Muscle weakness (generalized) (M62.81)    Time: [id number] PT Time Calculation (min) (ACUTE ONLY): 40 min   Charges:   PT Evaluation $PT Eval Moderate Complexity: 1 Mod PT Treatments $Therapeutic Exercise: 8-22 mins        Petra Kuba, PT, DPT 08/20/19, 11:47 AM

## 2019-08-20 NOTE — TOC Initial Note (Signed)
Transition of Care [facility]) - Initial/Assessment Note    Patient Details  Name: Mary Pacheco MRN: [id number] Date of Birth: 24-Oct-1940  Transition of [facility]) CM/SW Contact:    Mary Pacheco, Mary Pacheco Phone Number: [phone]  08/20/2019, 5:01 PM  Clinical Narrative: Clinical Social Worker (CSW) met with patient alone at bedside to discuss D/C plan. PT is recommending home health. Patient was alert and oriented X4 and was sitting up in the bed. CSW introduced self and explained role of CSW department. Per patient she lives in [place] with her husband Mary Pacheco. Patient reported that she has a rolling walker at home and declined a bedside commode. Patient reported that she has a bench in her shower and has elevated toilet seats at home. Patient is agreeable to home health and does not have a home health agency preference. Per Mary Pacheco Mary home health representative they can accept patient. Patient is agreeable to Mary and was provided CMS home health list. Patient is aware that her Lovenox price is $10. Patient reported no other needs or concerns. CSW will continue to follow and assist as needed.                   Expected Discharge Plan: [place] Barriers to Discharge: Continued Medical Work up   Patient Goals and CMS Choice Patient states their goals for this hospitalization and ongoing recovery are:: To go home. CMS Medicare.gov Compare Post Acute Care list provided to:: Patient Choice offered to / list presented to : Patient  Expected Discharge Plan and Services Expected Discharge Plan: [place] In-house Referral: Clinical Social Work   Post Acute Care Choice: [place] arrangements for the past 2 months: [place]                 DME Arranged: N/A         HH Arranged: PT Mary Pacheco Agency: Mary Pacheco (formerly [employer]) Date [place]: 08/20/19 Time [place]: 1011 Representative spoke  with at [place]: Mary Pacheco Arrangements/Services Living arrangements for the past 2 months: [place] Lives with:: Spouse Patient language and need for interpreter reviewed:: No Do you feel safe going back to the place where you live?: Yes      Need for Family Participation in Patient Care: No (Comment) Care giver support system in place?: Yes (comment) Current home services: DME(Patient has a walker at home.) Criminal Activity/Legal Involvement Pertinent to Current Situation/Hospitalization: No - Comment as needed  Activities of Daily Living Home Assistive Devices/Equipment: Eyeglasses ADL Screening (condition at time of admission) Patient's cognitive ability adequate to safely complete daily activities?: Yes Is the patient deaf or have difficulty hearing?: No Does the patient have difficulty seeing, even when wearing glasses/contacts?: No Does the patient have difficulty concentrating, remembering, or making decisions?: No Patient able to express need for assistance with ADLs?: Yes Does the patient have difficulty dressing or bathing?: No Independently performs ADLs?: Yes (appropriate for developmental age) Does the patient have difficulty walking or climbing stairs?: Yes Weakness of Legs: Left Weakness of Arms/Hands: None  Permission Sought/Granted Permission sought to share information with : Other (comment)(Home Health agency and DME agency.) Permission granted to share information with : Yes, Verbal Permission Granted              Emotional Assessment Appearance:: Appears stated age Attitude/Demeanor/Rapport: Engaged Affect (typically observed): Calm, Pleasant Orientation: : Oriented  to Self, Oriented to Place, Oriented to  Time, Oriented to Situation Alcohol / Substance Use: Not Applicable Psych Involvement: No (comment)  Admission diagnosis:  PRIMARY OSTEOARTHRITIS OF LEFT KNEE Patient Active Problem List   Diagnosis Date Noted  . Total knee  replacement status 08/19/2019  . Rash 06/05/2019  . Right ankle swelling 01/26/2019  . PAD (peripheral artery disease) ([place]) 01/20/2019  . Prediabetes 08/20/2018  . Abdominal bloating 11/11/2017  . Routine general medical examination at a health care facility 05/22/2016  . Plantar fasciitis, right 02/21/2016  . Right sciatic nerve pain 10/11/2015  . Benign paroxysmal positional vertigo 04/11/2015  . 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 Primary osteoarthritis of left knee 05/24/2014  . Colon cancer screening 05/17/2014  . Screening for breast cancer 04/06/2014  . Carotid stenosis 11/24/2013  . Overweight (BMI 25.0-29.9) 04/28/2013  . Medicare annual wellness visit, subsequent 03/20/2013  . Hypothyroidism 03/20/2013  . Anxiety 09/15/2012  . Herpes genitalis 01/09/2012  . GERD (gastroesophageal reflux disease) 01/09/2012  . Osteopenia 01/09/2012  . HTN (hypertension) 07/12/2011  . Hyperlipidemia, unspecified    PCP:  Burnard Hawthorne, FNP Pharmacy[facility]   [id number] [place] [place] [address]Phone: [phone]: [place]Phone: [phone]: [phone]    Social Determinants of Health (SDOH) Interventions    Readmission Risk Interventions No flowsheet data found.

## 2019-08-20 NOTE — Progress Notes (Signed)
Physical Therapy Treatment Patient Details Name: Mary Pacheco MRN: [id number] DOB: 1941/03/02 Today's Date: 08/20/2019    History of Present Illness Pt is a 78 y/o F s/p L TKA (10/28) with PMH of asthma, HTN, GERD, DM and hypothyroidism. Pt was previously independent with no AD for community and household distances.    PT Comments    Pt is a pleasant 78 year old F who was admitted for the above diagnosis. Pt continues to report pain is well-controlled, reporting no pain at rest nor increasing with mobility. At times during performance of therapeutic exercises, pt forgets what she is doing reverting back to previous exercises, requiring increased verbal cuing for correct performance. Pt reminded frequently throughout session today to call nursing staff prior to getting up to decrease risk of falling, with pt able to verbalize understanding at end of session by repeating back instruction. Pt performs bed mobility with mod I for extra time, transfers with supervision, and ambulation with CGA for safety. Pt reporting feeling less "shaky" this session compared to AM session and demos more normalized gait pattern (cadence, base of support); pt able to increase distance ambulating 250' with RW.  Pt demonstrates deficits with balance, strength and range of motion, requiring continued skilled PT intervention to promote return to prior level of function.     Follow Up Recommendations  Home health PT     Equipment Recommendations  Rolling walker with 5" wheels    Recommendations for Other Services       Precautions / Restrictions Precautions Precautions: Fall Restrictions Weight Bearing Restrictions: Yes LLE Weight Bearing: Weight bearing as tolerated    Mobility  Bed Mobility Overal bed mobility: Needs Assistance Bed Mobility: Supine to Sit(with HOB elevated)     Supine to sit: Modified independent (Device/Increase time) Sit to supine: Supervision   General bed mobility comments: pt  performing sit>supine as OT entered room, RN present, pt performed without assist  Transfers Overall transfer level: Needs assistance Equipment used: Rolling walker (2 wheeled) Transfers: Sit to/from Stand Sit to Stand: Supervision         General transfer comment: pt had just gotten back to bed  Ambulation/Gait Ambulation/Gait assistance: Min guard Gait Distance (Feet): 250 Feet Assistive device: Rolling walker (2 wheeled) Gait Pattern/deviations: Step-through pattern     General Gait Details: Pt cont to require cuing initially for RW management, though demos improved base of support.   Stairs             Wheelchair Mobility    Modified Rankin (Stroke Patients Only)       Balance Overall balance assessment: Needs assistance Sitting-balance support: Feet supported Sitting balance-Leahy Scale: Good Sitting balance - Comments: Pt able to sit steady EOB and reach outside BOS   Standing balance support: No upper extremity supported Standing balance-Leahy Scale: Good Standing balance comment: Able to reach outside BOS while standing without UE support                            Cognition Arousal/Alertness: Awake/alert Behavior During Therapy: WFL for tasks assessed/performed Overall Cognitive Status: Within Functional Limits for tasks assessed                                 General Comments: pt endorses feeling a little "loopy" after having taken medication      Exercises Total Joint Exercises Ankle Circles/Pumps:  AROM;Both;20 reps;Supine Quad Sets: Left;Strengthening;10 reps;Supine Heel Slides: AROM;Left;10 reps;Supine Straight Leg Raises: Left;Strengthening;10 reps;Supine Long Arc Quad: Strengthening;Left;10 reps;Seated Marching in Standing: Strengthening;Both;20 reps;Standing Other Exercises Other Exercises: Pt instructed in falls prevention including need for staff assist to get up; after pt endorses attempting to get from  recliner back to bed without staff, without RW, and with IV pole and polar care increasing pt's falls risk, bed alarm set at end of session, pt verbalized she would call for assist next time Other Exercises: Pt instructed AE/DME, home/routines modifications, compression stocking mgt, and polare care mgt; handout provided    General Comments        Pertinent Vitals/Pain Pain Assessment: No/denies pain Pain Score: 1  Pain Location: L knee Pain Descriptors / Indicators: Aching Pain Intervention(s): Monitored during session    Home Living Family/patient expects to be discharged to:: Private residence Living Arrangements: Spouse/significant other Available Help at Discharge: Family;Available 24 hours/day(husband) Type of Home: House Home Access: Stairs to enter Entrance Stairs-Rails: Can reach both;Left;Right Home Layout: One level Home Equipment: Walker - 2 wheels;Walker - 4 wheels;Cane - single point;Shower seat - built in      Prior Function Level of Independence: Independent      Comments: for community and household ambulation without AD   PT Goals (current goals can now be found in the care plan section) Acute Rehab PT Goals Patient Stated Goal: go home PT Goal Formulation: With patient Time For Goal Achievement: 09/03/19 Potential to Achieve Goals: Good Progress towards PT goals: Progressing toward goals    Frequency    BID      PT Plan Current plan remains appropriate    Co-evaluation              AM-PAC PT "6 Clicks" Mobility   Outcome Measure  Help needed turning from your back to your side while in a flat bed without using bedrails?: None Help needed moving from lying on your back to sitting on the side of a flat bed without using bedrails?: None Help needed moving to and from a bed to a chair (including a wheelchair)?: A Little Help needed standing up from a chair using your arms (e.g., wheelchair or bedside chair)?: A Little Help needed to walk in  hospital room?: A Little Help needed climbing 3-5 steps with a railing? : A Little 6 Click Score: 20    End of Session Equipment Utilized During Treatment: Gait belt Activity Tolerance: Patient tolerated treatment well;No increased pain Patient left: in chair;with call bell/phone within reach;with chair alarm set;with SCD's reapplied   PT Visit Diagnosis: Unsteadiness on feet (R26.81);Other abnormalities of gait and mobility (R26.89);Muscle weakness (generalized) (M62.81)     Time: [id number] PT Time Calculation (min) (ACUTE ONLY): 31 min  Charges:  $Gait Training: 8-22 mins $Therapeutic Exercise: 8-22 mins                     Petra Kuba, PT, DPT 08/20/19, 3:11 PM

## 2019-08-20 NOTE — TOC Benefit Eligibility Note (Signed)
Transition of Care [facility]) Benefit Eligibility Note    Patient Details  Name: Mary Pacheco MRN: [id number] Date of Birth: Jun 11, 1941   Medication/Dose: Enoxaparin 40mg  once daily for 14 days  Covered?: Yes  Prescription Coverage [facility]  Spoke with Person/Company/Phone Number:: Juanita with [employer] at [phone]  Co-Pay: $10.00 estimated copay  Prior Approval: No  Deductible: (No deductible per rep.)   Dannette Barbara Phone Number: [phone] 08/20/2019, 10:38 AM

## 2019-08-20 NOTE — TOC Progression Note (Signed)
Transition of Care [facility]) - Progression Note    Patient Details  Name: Mary Pacheco MRN: [id number] Date of Birth: Nov 19, 1940  [facility]) CM/SW Contact  Latiqua Daloia, Lenice Llamas Phone Number: [phone]  08/20/2019, 10:10 AM  Clinical Narrative: Lovenox price requested.          Expected Discharge Plan and Services                                                 Social Determinants of Health (SDOH) Interventions    Readmission Risk Interventions No flowsheet data found.

## 2019-08-20 NOTE — Evaluation (Signed)
Occupational Therapy Evaluation Patient Details Name: Mary Pacheco MRN: [id number] DOB: [birth year] Today's Date: 08/20/2019    History of Present Illness Pt is a 78 y/o F s/p L TKA (10/28) with PMH of asthma, HTN, GERD, DM and hypothyroidism. Pt was previously independent with no AD for community and household distances.   Clinical Impression   Pt seen for OT evaluation this date, POD#1 from above surgery. Pt was independent in all ADL prior to surgery and is eager to return to PLOF with less pain and improved safety and independence. Pt received transferring back to bed with RN after getting up without staff despite polar care and IV. Pt demo's slightly impaired safety awareness and endorses feeling a little "loopy" after medications recently. Pt instructed extensively in falls prevention and safety and pt verbalizes she will call staff next time. Pt currently requires minimal assist for LB dressing while in seated position due to pain and limited AROM of L knee. Pt instructed in polar care mgt, falls prevention strategies, home/routines modifications, DME/AE for LB bathing and dressing tasks, and compression stocking mgt. Pt would benefit from skilled OT services including additional instruction in dressing techniques with or without assistive devices for dressing and bathing skills to support recall and carryover prior to discharge and ultimately to maximize safety, independence, and minimize falls risk and caregiver burden. Do not currently anticipate any OT needs following this hospitalization.      Follow Up Recommendations  No OT follow up    Equipment Recommendations  3 in 1 bedside commode    Recommendations for Other Services       Precautions / Restrictions Precautions Precautions: Fall Restrictions Weight Bearing Restrictions: Yes LLE Weight Bearing: Weight bearing as tolerated      Mobility Bed Mobility Overal bed mobility: Needs Assistance Bed Mobility: Sit to  Supine     Supine to sit: Modified independent (Device/Increase time) Sit to supine: Supervision   General bed mobility comments: pt performing sit>supine as OT entered room, RN present, pt performed without assist  Transfers Overall transfer level: Needs assistance Equipment used: Rolling walker (2 wheeled) Transfers: Sit to/from Stand Sit to Stand: Min guard         General transfer comment: pt had just gotten back to bed    Balance Overall balance assessment: Needs assistance Sitting-balance support: Feet supported Sitting balance-Leahy Scale: Good Sitting balance - Comments: Pt able to sit steady EOB and reach outside BOS   Standing balance support: No upper extremity supported Standing balance-Leahy Scale: Good Standing balance comment: Able to reach outside BOS while standing                           ADL either performed or assessed with clinical judgement   ADL                                         General ADL Comments: Min A for LB ADL, CGA for functional mobility for ADL, Mod A for compression stocking mgt     Vision Patient Visual Report: No change from baseline Vision Assessment?: No apparent visual deficits     Perception     Praxis      Pertinent Vitals/Pain Pain Assessment: 0-10 Pain Score: 1  Pain Location: L knee Pain Descriptors / Indicators: Aching Pain Intervention(s): Limited activity within patient's tolerance;Monitored during  session;Repositioned;Ice applied     Hand Dominance     Extremity/Trunk Assessment Upper Extremity Assessment Upper Extremity Assessment: Overall WFL for tasks assessed   Lower Extremity Assessment Lower Extremity Assessment: LLE deficits/detail LLE Deficits / Details: expected post-op strength/ROM deficits       Communication Communication Communication: No difficulties   Cognition Arousal/Alertness: Awake/alert Behavior During Therapy: WFL for tasks  assessed/performed Overall Cognitive Status: Within Functional Limits for tasks assessed                                 General Comments: pt endorses feeling a little "loopy" after having taken medication   General Comments       Exercises Other Exercises: Pt instructed in falls prevention including need for staff assist to get up; after pt endorses attempting to get from recliner back to bed without staff, without RW, and with IV pole and polar care increasing pt's falls risk, bed alarm set at end of session, pt verbalized she would call for assist next time Other Exercises: Pt instructed AE/DME, home/routines modifications, compression stocking mgt, and polare care mgt; handout provided   Shoulder Instructions      Home Living Family/patient expects to be discharged to:: Private residence Living Arrangements: Spouse/significant other Available Help at Discharge: Family;Available 24 hours/day(husband) Type of Home: House Home Access: Stairs to enter [employer] of Steps: 3 Entrance Stairs-Rails: Can reach both;Left;Right Home Layout: One level     Bathroom Shower/Tub: Occupational psychologist: Handicapped height     Home Equipment: Environmental consultant - 2 wheels;Walker - 4 wheels;Cane - single point;Shower seat - built in          Prior Functioning/Environment Level of Independence: Independent        Comments: for community and household ambulation without AD        OT Problem List: Decreased strength;Decreased range of motion;Decreased safety awareness;Pain;Impaired balance (sitting and/or standing)      OT Treatment/Interventions: Self-care/ADL training;Therapeutic exercise;Therapeutic activities;DME and/or AE instruction;Patient/family education;Balance training    OT Goals(Current goals can be found in the care plan section) Acute Rehab OT Goals Patient Stated Goal: go home OT Goal Formulation: With patient Time For Goal Achievement:  09/03/19 Potential to Achieve Goals: Good ADL Goals Pt Will Perform Lower Body Dressing: with min guard assist;sit to/from stand;with adaptive equipment Pt Will Transfer to Toilet: with supervision;bedside commode;ambulating;regular height toilet(BSC over toilet, LRAD for amb) Additional ADL Goal #1: Pt will independently instruct family/caregiver in compression stocking mgt Additional ADL Goal #2: Pt will independently instruct family/caregiver in polar care mgt  OT Frequency: Min 1X/week   Barriers to D/C:            Co-evaluation              AM-PAC OT "6 Clicks" Daily Activity     Outcome Measure Help from another person eating meals?: None Help from another person taking care of personal grooming?: None Help from another person toileting, which includes using toliet, bedpan, or urinal?: A Little Help from another person bathing (including washing, rinsing, drying)?: A Little Help from another person to put on and taking off regular upper body clothing?: None Help from another person to put on and taking off regular lower body clothing?: A Little 6 Click Score: 21   End of Session    Activity Tolerance: Patient tolerated treatment well Patient left: in bed;with call bell/phone within reach;with  bed alarm set;with SCD's reapplied;Other (comment)(bone foam, polar care in place)  OT Visit Diagnosis: Other abnormalities of gait and mobility (R26.89);Pain Pain - Right/Left: Left Pain - part of body: Knee                Time: 1040-1103 OT Time Calculation (min): 23 min Charges:  OT General Charges $OT Visit: 1 Visit OT Evaluation $OT Eval Low Complexity: 1 Low OT Treatments $Self Care/Home Management : 8-22 mins  Jeni Salles, MPH, MS, OTR/L ascom [phone] 08/20/19, 1:15 PM

## 2019-08-20 NOTE — Care Management Obs Status (Signed)
[facility] NOTIFICATION   Patient Details  Name: Mary Pacheco MRN: [id number] Date of Birth: 1941/04/13   Medicare Observation Status Notification Given:  Yes    Boomer Winders, Lenice Llamas 08/20/2019, 12:46 PM

## 2019-08-20 NOTE — Progress Notes (Signed)
  Subjective: 1 Day Post-Op Procedure(s) (LRB): COMPUTER ASSISTED TOTAL KNEE ARTHROPLASTY (Left) Patient reports pain as  well-controlled.   Patient is well, and has had no acute complaints or problems Plan is to go Home after hospital stay. Negative for chest pain and shortness of breath Fever: no Gastrointestinal: negative for nausea and vomiting.  Patient has not had a bowel movement.  Objective: Vital signs in last 24 hours: Temp:  [97.3 F (36.3 C)-98.5 F (36.9 C)] 97.6 F (36.4 C) (10/29 0411) Pulse Rate:  [63-81] 71 (10/29 0411) Resp:  [11-19] 18 (10/29 0411) BP: (115-181)/(48-72) 115/48 (10/29 0411) SpO2:  [99 %-100 %] 100 % (10/29 0411) Weight:  [65 kg] 65 kg (10/28 1028)  Intake/Output from previous day:  Intake/Output Summary (Last 24 hours) at 08/20/2019 0746 Last data filed at 08/20/2019 0526 Gross per 24 hour  Intake 2382.02 ml  Output 8485 ml  Net -6102.98 ml    Intake/Output this shift: No intake/output data recorded.  Labs: No results for input(s): HGB in the last 72 hours. No results for input(s): WBC, RBC, HCT, PLT in the last 72 hours. No results for input(s): NA, K, CL, CO2, BUN, CREATININE, GLUCOSE, CALCIUM in the last 72 hours. No results for input(s): LABPT, INR in the last 72 hours.   EXAM General - Patient is Alert, Appropriate and Oriented Extremity - Neurovascular intact Compartment soft Dressing/Incision - post-operative dressing in place. Hemovac in place Motor Function - intact, moving foot and toes well on exam. Able to perform straight leg raise  Cardiovascular- Regular rate and rhythm, no murmurs/rubs/gallops Respiratory- Lungs clear to auscultation bilaterally Gastrointestinal- active bowel sounds   Assessment/Plan: 1 Day Post-Op Procedure(s) (LRB): COMPUTER ASSISTED TOTAL KNEE ARTHROPLASTY (Left) Active Problems:   Total knee replacement status  Estimated body mass index is 25 kg/m as calculated from the following:  Height as of this encounter: 5' 3.5" (1.613 m).   Weight as of this encounter: 65 kg. Advance diet Up with therapy Plan for discharge tomorrow    DVT Prophylaxis - Lovenox and foot pumps Weight-Bearing as tolerated to left leg  Cassell Smiles, PA-C [facility] 08/20/2019, 7:46 AM

## 2019-08-21 DIAGNOSIS — M1712 Unilateral primary osteoarthritis, left knee: Secondary | ICD-10-CM | POA: Diagnosis not present

## 2019-08-21 MED ORDER — OXYCODONE HCL 5 MG PO TABS: 5.0000 mg | ORAL_TABLET | ORAL | 0 refills | Status: DC | PRN

## 2019-08-21 MED ORDER — CELECOXIB 200 MG PO CAPS: 200.0000 mg | ORAL_CAPSULE | Freq: Two times a day (BID) | ORAL | 0 refills | Status: AC

## 2019-08-21 MED ORDER — ENOXAPARIN SODIUM 40 MG/0.4ML ~~LOC~~ SOLN: 40.0000 mg | SUBCUTANEOUS | 0 refills | Status: DC

## 2019-08-21 NOTE — Anesthesia Postprocedure Evaluation (Signed)
Anesthesia Post Note  Patient: Mary Pacheco  Procedure(s) Performed: COMPUTER ASSISTED TOTAL KNEE ARTHROPLASTY (Left Knee)  Patient location during evaluation: Nursing Unit Anesthesia Type: Spinal Level of consciousness: awake, awake and alert, oriented and patient cooperative Pain management: pain level controlled Respiratory status: spontaneous breathing and nonlabored ventilation Postop Assessment: no headache, no backache, adequate PO intake, able to ambulate, no apparent nausea or vomiting and patient able to bend at knees Anesthetic complications: no     Last Vitals:  Vitals:   08/20/19 1550 08/20/19 2354  BP: (!) 134/54 (!) 118/46  Pulse: 71 70  Resp: 15 18  Temp: (!) 36.4 C 37 C  SpO2: 100% 97%    Last Pain:  Vitals:   08/20/19 2124  TempSrc:   PainSc: 2                  Genine Beckett,  Delania Ferg R

## 2019-08-21 NOTE — TOC Transition Note (Signed)
Transition of Care [facility]) - CM/SW Discharge Note   Patient Details  Name: Mary Pacheco MRN: [id number] Date of Birth: 26-Dec-1940  Transition of [facility]) CM/SW Contact:  Maritza [place], Lenice Llamas Phone Number: [phone]  08/21/2019, 1:18 PM   Clinical Narrative: Clinical Social Worker (CSW) made Helene Kelp [facility] home health representative aware that patient discharged home today. Patient has no DME needs and was notified of her Lovenox price $10. Please reconsult if future social work needs arise. CSW signing off.     Final next level of care: [place] Barriers to Discharge: Barriers Resolved   Patient Goals and CMS Choice Patient states their goals for this hospitalization and ongoing recovery are:: To go home. CMS Medicare.gov Compare Post Acute Care list provided to:: Patient Choice offered to / list presented to : Patient  Discharge Placement                       Discharge Plan and Services In-house Referral: Clinical Social Work   Post Acute Care Choice: Home Health          DME Arranged: N/A         HH Arranged: PT [place] Agency: [facility] [employer] [facility] [employer] Date [place]: 08/21/19 Time [place]: 47 Representative spoke with at [place]: [place]) Interventions     Readmission Risk Interventions No flowsheet data found.

## 2019-08-21 NOTE — Discharge Summary (Signed)
Physician Discharge Summary  Patient ID: Mary Pacheco MRN: [id number] DOB/AGE: 10/29/1940 78 y.o.  Admit date: 08/19/2019 Discharge date: 08/21/2019  Admission Diagnoses:  PRIMARY OSTEOARTHRITIS OF LEFT KNEE  Surgeries: PROCEDURE:  Left total knee arthroplasty using computer-assisted navigation  SURGEON:  Marciano Sequin. M.D.  ASSISTANT: Cassell Smiles, PA-C (present and scrubbed throughout the case, critical for assistance with exposure, retraction, instrumentation, and closure)  ANESTHESIA: spinal  ESTIMATED BLOOD LOSS: 50 mL  FLUIDS REPLACED: 1000 mL of crystalloid  TOURNIQUET TIME: 98 minutes  DRAINS: 2 medium Hemovac drains  SOFT TISSUE RELEASES: Anterior cruciate ligament, posterior cruciate ligament, deep medial collateral ligament, patellofemoral ligament  IMPLANTS UTILIZED: DePuy Attune size 6N posterior stabilized femoral component (cemented), size 4 rotating platform tibial component (cemented), 35 mm medialized dome patella (cemented), and a 5 mm stabilized rotating platform polyethylene insert.  Discharge Diagnoses: Patient Active Problem List   Diagnosis Date Noted  . Total knee replacement status 08/19/2019  . Rash 06/05/2019  . Right ankle swelling 01/26/2019  . PAD (peripheral artery disease) ([place]) 01/20/2019  . Prediabetes 08/20/2018  . Abdominal bloating 11/11/2017  . Routine general medical examination at a health care facility 05/22/2016  . Plantar fasciitis, right 02/21/2016  . Right sciatic nerve pain 10/11/2015  . Benign paroxysmal positional vertigo 04/11/2015  . 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 Primary osteoarthritis of left knee 05/24/2014  . Colon cancer screening 05/17/2014  . Screening for breast cancer 04/06/2014  . Carotid stenosis 11/24/2013  . Overweight (BMI 25.0-29.9) 04/28/2013  . Medicare annual wellness visit, subsequent 03/20/2013  . Hypothyroidism 03/20/2013  . Anxiety 09/15/2012  . Herpes genitalis 01/09/2012  .  GERD (gastroesophageal reflux disease) 01/09/2012  . Osteopenia 01/09/2012  . HTN (hypertension) 07/12/2011  . Hyperlipidemia, unspecified     Past Medical History:  Diagnosis Date  . Anxiety 2011   patient with anxiety will continue Alprazolam prn  . Arthritis   . GERD (gastroesophageal reflux disease)    pt. with esophagea reflux. She reports great improvement with Dexilant.  [name] Hearing loss    s/p hearing aids  . 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 Hyperlipidemia    patient with HL on statins. lipids well controlled  . Hypertension   . Hypothyroidism   . Pre-diabetes   . Skin cancer      Transfusion:    Consultants (if any):   Discharged Condition: Improved  Hospital Course: KHADIJAH CARBARY is an 78 y.o. female who was admitted 08/19/2019 with a diagnosis of left knee osteoarthritis and went to the operating room on 08/19/2019 and underwent left total knee arthroplasty. The patient received perioperative antibiotics for prophylaxis (see below). The patient tolerated the procedure well and was transported to PACU in stable condition. After meeting PACU criteria, the patient was subsequently transferred to the Orthopaedics/Rehabilitation unit.   The patient received DVT prophylaxis in the form of early mobilization, Lovenox, Foot Pumps and TED hose. A sacral pad had been placed and heels were elevated off of the bed with rolled towels in order to protect skin integrity. Foley catheter was discontinued on postoperative day #1. Wound drains were discontinued on postoperative day #2. The surgical incision was healing well without signs of infection.  Physical therapy was initiated postoperatively for transfers, gait training, and strengthening. Occupational therapy was initiated for activities of daily living and evaluation for assisted devices. Rehabilitation goals were reviewed in detail with the patient. The patient made steady progress with physical therapy and physical therapy  recommended discharge to Home.   The patient achieved his preliminary goals of this hospitalization and was felt to be medically and orthopaedically appropriate for discharge.  She was given perioperative antibiotics:  Anti-infectives (From admission, onward)   Start     Dose/Rate Route Frequency Ordered Stop   08/19/19 2200  acyclovir (ZOVIRAX) 200 MG capsule 400 mg     400 mg Oral Daily at bedtime 08/19/19 1733     08/19/19 1900  ceFAZolin (ANCEF) IVPB 2g/100 mL premix     2 g 200 mL/hr over 30 Minutes Intravenous Every 6 hours 08/19/19 1733 08/20/19 1240   08/19/19 1010  ceFAZolin (ANCEF) 2-4 GM/100ML-% IVPB    Note to Pharmacy: Register, Karen   : cabinet override      08/19/19 1010 08/19/19 1225   08/19/19 0945  ceFAZolin (ANCEF) IVPB 2g/100 mL premix     2 g 200 mL/hr over 30 Minutes Intravenous On call to O.R. 08/19/19 [id number] 08/19/19 1225    .  Recent vital signs:  Vitals:   08/20/19 1550 08/20/19 2354  BP: (!) 134/54 (!) 118/46  Pulse: 71 70  Resp: 15 18  Temp: (!) 97.5 F (36.4 C) 98.6 F (37 C)  SpO2: 100% 97%    Recent laboratory studies:  No results for input(s): WBC, HGB, HCT, PLT, K, CL, CO2, BUN, CREATININE, GLUCOSE, CALCIUM, LABPT, INR in the last 72 hours.  Diagnostic Studies: Dg Knee Left Port  Result Date: 08/19/2019 CLINICAL DATA:  Left knee arthroplasty. EXAM: PORTABLE LEFT KNEE - 1-2 VIEW COMPARISON:  Left knee MRI 01/09/2010 FINDINGS: Post total left knee arthroplasty with posterior patellar resurfacing. Expected postsurgical soft tissue changes including anterior skin staples and a surgical drain position in the suprapatellar recess. Hardware is in expected alignment. No acute complication is evident. IMPRESSION: Satisfactory postoperative appearance of a total left knee arthroplasty. Surgical drain in place. Electronically Signed   By: Lovena Le M.D.   On: 08/19/2019 16:17    Discharge Medications:   Allergies as of 08/21/2019      Reactions    Molds & Smuts    Induces asthma   Rosuvastatin Hives   Simvastatin    myalgia      Medication List    STOP taking these medications   aspirin 81 MG tablet     TAKE these medications   acetaminophen 500 MG tablet Commonly known as: TYLENOL Take 1,000 mg by mouth every 6 (six) hours as needed for moderate pain.   acyclovir 400 MG tablet Commonly known as: ZOVIRAX TAKE 1 TABLET BY MOUTH EVERY DAY What changed: when to take this   ALPRAZolam 0.25 MG tablet Commonly known as: XANAX TAKE 1 TABLET BY MOUTH TWICE A DAY AS NEEDED FOR ANXIETY What changed:   how much to take  how to take this  when to take this  reasons to take this   amLODipine 2.5 MG tablet Commonly known as: NORVASC TAKE 1 TABLET BY MOUTH EVERY DAY   atorvastatin 20 MG tablet Commonly known as: LIPITOR TAKE 1 TABLET BY MOUTH EVERY DAY What changed: when to take this   celecoxib 200 MG capsule Commonly known as: CELEBREX Take 1 capsule (200 mg total) by mouth 2 (two) times daily.   CRANBERRY PO Take 650 mg by mouth daily.   enoxaparin 40 MG/0.4ML injection Commonly known as: LOVENOX Inject 0.4 mLs (40 mg total) into the skin daily for 14 days.   EQL CoQ10 200 MG capsule Generic drug:  Coenzyme Q10 Take 200 mg by mouth daily.   Fish Oil Triple Strength 1400 MG Caps Take 1,400 mg by mouth daily.   FLAX SEED OIL PO Take 1,400 mg by mouth daily.   GREEN TEA PO Take 500 mg by mouth daily.   hydrochlorothiazide 12.5 MG capsule Commonly known as: MICROZIDE Take 1 capsule (12.5 mg total) by mouth daily.   oxyCODONE 5 MG immediate release tablet Commonly known as: Oxy IR/ROXICODONE Take 1 tablet (5 mg total) by mouth every 4 (four) hours as needed for moderate pain (pain score 4-6).   pantoprazole 40 MG tablet Commonly known as: PROTONIX TAKE 1 TABLET DAILY   PREVAGEN PO Take 1 tablet by mouth daily.   PROBIOTIC DAILY PO Take 1 capsule by mouth daily.   Synthroid 50 MCG tablet  Generic drug: levothyroxine TAKE 1 TABLET DAILY   TOTAL CARDIO HEALTH FORMULA PO Take 1 tablet by mouth daily.   Vitamin D 50 MCG (2000 UT) tablet Take 2,000 Units by mouth daily.            Durable Medical Equipment  (From admission, onward)         Start     Ordered   08/19/19 1734  DME Walker rolling  Once    Question:  Patient needs a walker to treat with the following condition  Answer:  Total knee replacement status   08/19/19 1733   08/19/19 1734  DME Bedside commode  Once    Question:  Patient needs a bedside commode to treat with the following condition  Answer:  Total knee replacement status   08/19/19 1733          Disposition: Discharge disposition: 01-Home or Self Care         Follow-up Information    [facility] On 09/03/2019.   Specialty: Orthopedic Surgery Why: at 1:15pm Contact information: [place] [address] [phone]        Dereck Leep, MD On 10/01/2019.   Specialty: Orthopedic Surgery Why: at 10:00am Contact information: [place] [id number] [place], PA-C 08/21/2019, 8:19 AM

## 2019-08-21 NOTE — Progress Notes (Signed)
Physical Therapy Treatment Patient Details Name: Mary Pacheco MRN: [id number] DOB: 06-07-1941 Today's Date: 08/21/2019    History of Present Illness Pt is a 78 y/o F s/p L TKA (10/28) with PMH of asthma, HTN, GERD, DM and hypothyroidism. Pt was previously independent with no AD for community and household distances.    PT Comments    Patient tolerated treatment well and demonstrates excellent progress towards goals at this point. Patient was able to perform 5 time sit to stand test with no UE support in 12 seconds from elevated surface and in 18 seconds from chair. Ambulated up and down stairs with step over step gait with L handrail and CGA-min A support. Patient demo mild forgetfulness of instructions for stair climbing but showed good carry over with improved walker use and gait form during ambulation. Patient would benefit from continued physical therapy to address remaining impairments and functional limitations to work towards stated goals and return to PLOF or maximal functional independence.     Follow Up Recommendations  Home health PT     Equipment Recommendations  Rolling walker with 5" wheels    Recommendations for Other Services       Precautions / Restrictions Precautions Precautions: Fall Restrictions Weight Bearing Restrictions: Yes LLE Weight Bearing: Weight bearing as tolerated    Mobility  Bed Mobility Overal bed mobility: Needs Assistance Bed Mobility: Supine to Sit(with HOB elevated)     Supine to sit: Modified independent (Device/Increase time)     General bed mobility comments: supine to sit with HOB elevated, using extended time  Transfers Overall transfer level: Needs assistance Equipment used: Rolling walker (2 wheeled) Transfers: Sit to/from Stand Sit to Stand: Supervision         General transfer comment: Bed to chair, on and off standard toilet with unilateral UE support, 2x 5TSTS test from elevated surface (12 seconds) and from chair  (18 seconds, pushed of chair with back of legs) with no UE support. Required supervision and cuing for improved body mechanics.  Ambulation/Gait Ambulation/Gait assistance: Min Gaffer (Feet): 400 Feet Assistive device: Rolling walker (2 wheeled) Gait Pattern/deviations: Step-through pattern Gait velocity: 1 ft/sec Gait velocity interpretation: <1.31 ft/sec, indicative of household ambulator General Gait Details: good carry over for improved heel strike. mild decreased R step lenght. Occasional cuing for proper RW use but much improved from previous sessions.   Stairs Stairs: Yes Stairs assistance: Supervision;Min guard;Min assist Stair Management: One rail Left Number of Stairs: 4 General stair comments: ascended/descended 4 steps for 3 attempts using L handrail only, supervision upon ascent, CGA with breif moments of min A due to reaching for R handrail. Heavy cuing for step pattern at first. Patient unable to remember which foot to use first resulting in demonstration of adequate step up/down with opposite than initially reccomended foot. first two attempts step to patter, last attempt step over step.   Wheelchair Mobility    Modified Rankin (Stroke Patients Only)       Balance Overall balance assessment: Needs assistance Sitting-balance support: Feet supported Sitting balance-Leahy Scale: Good Sitting balance - Comments: Pt able to sit steady EOB and reach outside BOS   Standing balance support: No upper extremity supported Standing balance-Leahy Scale: Good Standing balance comment: Able to reach outside BOS while standing without UE support, able to compolete sit <> stand x 5 with out use of hands but RW close for safety  Cognition Arousal/Alertness: Awake/alert Behavior During Therapy: WFL for tasks assessed/performed Overall Cognitive Status: Within Functional Limits for tasks assessed                                  General Comments: forgetful at times      Exercises Total Joint Exercises Goniometric ROM: 0-5-100 (lackin 5 degrees extension, able to reach 100 degrees flexion with AAROM). Other Exercises Other Exercises: 5x sit to stand x 2 (from elevated surface and from chair) with no UE support. 2-3 more practices as well. Cuing for improved body mechanics. Other Exercises: PROM L knee to max range x 2    General Comments        Pertinent Vitals/Pain Pain Assessment: No/denies pain    Home Living                      Prior Function            PT Goals (current goals can now be found in the care plan section) Acute Rehab PT Goals Patient Stated Goal: go home PT Goal Formulation: With patient Time For Goal Achievement: 09/03/19 Potential to Achieve Goals: Good Progress towards PT goals: Progressing toward goals    Frequency    BID      PT Plan Current plan remains appropriate    Co-evaluation              AM-PAC PT "6 Clicks" Mobility   Outcome Measure  Help needed turning from your back to your side while in a flat bed without using bedrails?: None Help needed moving from lying on your back to sitting on the side of a flat bed without using bedrails?: None Help needed moving to and from a bed to a chair (including a wheelchair)?: A Little Help needed standing up from a chair using your arms (e.g., wheelchair or bedside chair)?: A Little Help needed to walk in hospital room?: A Little Help needed climbing 3-5 steps with a railing? : A Little 6 Click Score: 20    End of Session Equipment Utilized During Treatment: Gait belt Activity Tolerance: Patient tolerated treatment well;No increased pain Patient left: in chair;with call bell/phone within reach;with chair alarm set;with SCD's reapplied   PT Visit Diagnosis: Unsteadiness on feet (R26.81);Other abnormalities of gait and mobility (R26.89);Muscle weakness (generalized) (M62.81)      Time: [id number] PT Time Calculation (min) (ACUTE ONLY): 40 min  Charges:  $Gait Training: 8-22 mins $Therapeutic Activity: 23-37 mins                     Everlean Alstrom. Graylon Good, PT, DPT 08/21/19, 9:50 AM

## 2019-08-21 NOTE — Progress Notes (Signed)
OT Cancellation Note  Patient Details Name: Mary Pacheco MRN: [id number] DOB: 03/12/1941   Cancelled Treatment:    Reason Eval/Treat Not Completed: Other (comment). Pt with nursing, unavailable for OT tx. Will re-attempt as pt is available.  Jeni Salles, MPH, MS, OTR/L ascom [phone] 08/21/19, 10:14 AM

## 2019-08-21 NOTE — Progress Notes (Signed)
  Subjective: 2 Days Post-Op Procedure(s) (LRB): COMPUTER ASSISTED TOTAL KNEE ARTHROPLASTY (Left) Patient reports pain as  well controlled.   Patient is well, and has had no acute complaints or problems Plan is to go Home after hospital stay. Negative for chest pain and shortness of breath Fever: no Gastrointestinal: negative for nausea and vomiting.  Patient has had a bowel movement.  Objective: Vital signs in last 24 hours: Temp:  [97.5 F (36.4 C)-98.6 F (37 C)] 98.6 F (37 C) (10/29 2354) Pulse Rate:  [70-79] 70 (10/29 2354) Resp:  [15-18] 18 (10/29 2354) BP: (118-134)/(46-54) 118/46 (10/29 2354) SpO2:  [97 %-100 %] 97 % (10/29 2354)  Intake/Output from previous day:  Intake/Output Summary (Last 24 hours) at 08/21/2019 0813 Last data filed at 08/21/2019 0006 Gross per 24 hour  Intake 1880.61 ml  Output 140 ml  Net 1740.61 ml    Intake/Output this shift: No intake/output data recorded.  Labs: No results for input(s): HGB in the last 72 hours. No results for input(s): WBC, RBC, HCT, PLT in the last 72 hours. No results for input(s): NA, K, CL, CO2, BUN, CREATININE, GLUCOSE, CALCIUM in the last 72 hours. No results for input(s): LABPT, INR in the last 72 hours.   EXAM General - Patient is Alert, Appropriate and Oriented Extremity - Neurovascular intact Dorsiflexion/Plantar flexion intact Compartment soft Dressing/Incision -minimal sanguinous drainage.  Postoperative dressing and Hemovac drain in place.  Foley cath remains in place and functioning. Motor Function - intact, moving foot and toes well on exam.  Able to form straight leg raise. Cardiovascular- Regular rate and rhythm, no murmurs/rubs/gallops Respiratory- Lungs clear to auscultation bilaterally Gastrointestinal- active bowel sounds   Assessment/Plan: 2 Days Post-Op Procedure(s) (LRB): COMPUTER ASSISTED TOTAL KNEE ARTHROPLASTY (Left) Active Problems:   Total knee replacement status  Estimated body  mass index is 25 kg/m as calculated from the following:   Height as of this encounter: 5' 3.5" (1.613 m).   Weight as of this encounter: 65 kg. Advance diet Up with therapy Discharge home with home health  Postoperative dressing and Hemovac removed.  Fresh honeycomb dressing placed.  DVT Prophylaxis - Lovenox, Ted hose and foot pumps Weight-Bearing as tolerated to left leg  Cassell Smiles, PA-C [facility] Orthopaedic Surgery 08/21/2019, 8:13 AM

## 2019-09-19 ENCOUNTER — Other Ambulatory Visit: Payer: Self-pay | Admitting: Family

## 2019-09-21 ENCOUNTER — Telehealth: Payer: Self-pay | Admitting: Cardiovascular Disease

## 2019-09-21 NOTE — Telephone Encounter (Signed)
Spoke with patient and she states that she had not been feeling well before her therapy. Afterwards her BP was elevated. Today her BP was 163/64 after therapy and 134/93 when she got home. Previous numbers were 170/57 and then went down to 124/78. She does have anxiety but also had knee replacement in October so she feels that this is part of why her blood pressures will go up and when she is at home and relaxed it is normal. Instructed her to keep a log of her blood pressure readings and let us know if they persistently remain greater than 140/90 and to call us in a week or so with those readings. Reviewed that I would place up front some information on blood pressure and also a sheet to write them on. She wants to come pick those up here in the office to take to her next therapy appointment. She was very appreciative for the call with no further questions at this time.

## 2019-09-21 NOTE — Telephone Encounter (Signed)
Patient states her Physical Therapist wants to cancel her PT due to her elevated BP. States this past Friday it was 180/51 and today it was 160/51 . States she is having slight dizziness and shaky. Please call to discuss. Pt c/o BP issue: STAT if pt c/o blurred vision, one-sided weakness or slurred speech  1. What are your last 5 BP readings?  Last Friday 180/51 Today 160/51  2. Are you having any other symptoms (ex. Dizziness, headache, blurred vision, passed out)? Dizziness and shakiness  3. What is your BP issue? elevated

## 2019-09-22 ENCOUNTER — Telehealth: Payer: Self-pay | Admitting: *Deleted

## 2019-09-22 NOTE — Telephone Encounter (Signed)
BP 167/65 pulse, patient said she had thumping sound is ears prompted her to check BP , she said on Friday she had slight headache and the same feeling today that prompted her to take BP. Patient said she felt dizzy but was taking PT at the time BP taken yesterday. had just come off therapy her BP 170/51. Patient advised she cannot go to UC or [facility] office we have no  Appointments available .Patientisgoing to call cardiologist to see if he can work her in and return call  To office.

## 2019-09-22 NOTE — Telephone Encounter (Signed)
Patient called in due to elevated BP. She does report that this reading was before she took her medications. Requested that she take blood pressure while on the phone with me so that we can see how her numbers are now. Results were 191/74 heart rate 81. Inquired how new is her machine and she said it is 79 years old. Advised that given we are having some various numbers that she may want to invest in a new one for closer monitoring. Instructed her to monitor blood pressures and keep a log of those readings. Advised that I would have scheduling to call and set her up for appointment and that it looks like her PCP office has new orders as well so they should be in touch. She verbalized understanding and was appreciative for the call.

## 2019-09-22 NOTE — Telephone Encounter (Signed)
HAVE HER INCREASE HER AMLODIPINE TO 5MG  DAILY

## 2019-09-22 NOTE — Telephone Encounter (Signed)
I think that she should continue her physical therapy given the numbers detailed

## 2019-09-22 NOTE — Telephone Encounter (Signed)
Patient calling to report resting BP this morning at 10 am was 167/65.     Patient wants call back today if possible as she feels like this is urgent.

## 2019-09-22 NOTE — Telephone Encounter (Signed)
Patient has been advised

## 2019-09-22 NOTE — Telephone Encounter (Signed)
Copied from [place] [id number]. Topic: General - Inquiry >> Sep 22, 2019  2:27 PM Mathis Bud wrote: Reason for CRM: Patient is requesting a call back regarding BP issues.   First thing this morning BP was 167/55. Patient and I got disconnected and that is all I got... Patient was reading me other BP from today.  I tried calling patient back and the phone got disconnected again, the third time the line was busy.  Can PCP nurse follow up Call back [phone]

## 2019-10-11 NOTE — Progress Notes (Signed)
Date:  10/11/2019   ID:  Mary Pacheco, DOB 1941-09-09, MRN [id number]  Patient Location:  [place] [address]   Provider location:   [facility], [place] office  PCP:  Burnard Hawthorne, FNP  Cardiologist:  No primary care provider on file.   Chief Complaint  Patient presents with  . Follow-up    Pt. c/o elevated BP. Meds reviewed by the pt. verbally.     History of Present Illness:    Mary Pacheco is a 78 y.o. female PMH of  hypertension,  hyperlipidemia,  bilateral carotid arterial disease,  estimated at 50-60% on the right, <39% on the left, in 10/2016 anxiety  presenting for routine followup of her PAD/carotid disease, hyperlipidemia  New knee on the left, knee replacement Completed PT Dizziness, better, thinks it was inner ear  Labile pressure at home  On average 120s, sometimes low to 90 or [id number] systolic, other [id number] High today, long walk into office, numbers running high  lipitor 40 gave side effects, went back to 20 daily Arm ache on the 40 daily  Numbers discussed with her Lab Results  Component Value Date   CHOL 169 08/25/2018   HDL 79.40 08/25/2018   LDLCALC 75 08/25/2018   TRIG 73.0 08/25/2018   Scheduled for new lab work February 2021  Previous CT coronary calcium score of 5  Duplex ultrasound of the carotid arteries 60-79% RICA and 123456 LICA.  Followed by vein and vascular Prior carotid ultrasounds discussed with her in detail, slight increase in disease on the right  EKG personally reviewed by myself on todays visit Shows normal sinus rhythm rate 78 bpm no significant ST-T wave changes   Past Medical History:  Diagnosis Date  . Anxiety 2011   patient with anxiety will continue Alprazolam prn  . Arthritis   . GERD (gastroesophageal reflux disease)    pt. with esophagea reflux. She reports great improvement with Dexilant.  [name] Hearing loss    s/p hearing aids  . 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  Hyperlipidemia    patient with HL on statins. lipids well controlled  . Hypertension   . Hypothyroidism   . Pre-diabetes   . Skin cancer    Past Surgical History:  Procedure Laterality Date  . ABDOMINAL HYSTERECTOMY  1993   multiple fibroid cysts  . cataract surgery  02/2011   bilateral  . 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 skin cancer removal    . TONSILLECTOMY       No outpatient medications have been marked as taking for the 10/12/19 encounter (Appointment) with Minna Merritts, MD.     Allergies:   Molds & smuts, Rosuvastatin, and Simvastatin   Social History   Tobacco Use  . Smoking status: Never Smoker  . Smokeless tobacco: Never Used  Substance Use Topics  . Alcohol use: Yes    Comment: socially  . Drug use: No     Current Outpatient Medications on File Prior to Visit  Medication Sig Dispense Refill  . acetaminophen (TYLENOL) 500 MG tablet Take 1,000 mg by mouth every 6 (six) hours as needed for moderate pain.    [name] acyclovir (ZOVIRAX) 400 MG tablet TAKE 1 TABLET BY MOUTH EVERY DAY (Patient taking differently: Take 400 mg by mouth at bedtime. )  90 tablet 2  . ALPRAZolam (XANAX) 0.25 MG tablet TAKE 1 TABLET BY MOUTH TWICE A DAY AS NEEDED FOR ANXIETY (Patient taking differently: Take 0.125 mg by mouth daily as needed for anxiety. TAKE 1 TABLET BY MOUTH TWICE A DAY AS NEEDED FOR ANXIETY) 30 tablet 1  . amLODipine (NORVASC) 2.5 MG tablet TAKE 1 TABLET BY MOUTH EVERY DAY (Patient taking differently: Take 2.5 mg by mouth daily. ) 90 tablet 1  . Apoaequorin (PREVAGEN PO) Take 1 tablet by mouth daily.    [name] atorvastatin (LIPITOR) 20 MG tablet TAKE 1 TABLET BY MOUTH EVERY DAY 90 tablet 1  . Cholecalciferol (VITAMIN D) 50 MCG (2000 UT) tablet Take 2,000 Units by mouth daily.     . Coenzyme Q10 (EQL COQ10) 200 MG capsule Take  200 mg by mouth daily.     [name] CRANBERRY PO Take 650 mg by mouth daily.    [name] enoxaparin (LOVENOX) 40 MG/0.4ML injection Inject 0.4 mLs (40 mg total) into the skin daily for 14 days. 5.6 mL 0  . Flaxseed, Linseed, (FLAX SEED OIL PO) Take 1,400 mg by mouth daily.    Nyoka Cowden Tea, Camellia sinensis, (GREEN TEA PO) Take 500 mg by mouth daily.     . hydrochlorothiazide (MICROZIDE) 12.5 MG capsule Take 1 capsule (12.5 mg total) by mouth daily. 90 capsule 3  . Misc Natural Products (TOTAL CARDIO HEALTH FORMULA PO) Take 1 tablet by mouth daily.      . Omega-3 Fatty Acids (FISH OIL TRIPLE STRENGTH) 1400 MG CAPS Take 1,400 mg by mouth daily.     [name] oxyCODONE (OXY IR/ROXICODONE) 5 MG immediate release tablet Take 1 tablet (5 mg total) by mouth every 4 (four) hours as needed for moderate pain (pain score 4-6). 30 tablet 0  . pantoprazole (PROTONIX) 40 MG tablet TAKE 1 TABLET DAILY 90 tablet 0  . Probiotic Product (PROBIOTIC DAILY PO) Take 1 capsule by mouth daily.     [name] SYNTHROID 50 MCG tablet TAKE 1 TABLET DAILY 90 tablet 4   No current facility-administered medications on file prior to visit.     Family Hx: The patient's family history includes Heart attack in her father; Heart attack (age of onset: 25) in her sister; Heart disease in her mother; Heart failure in her father and mother; Hernia in her sister; Hyperlipidemia in her father and mother; Hypertension in her father and mother; Prostate cancer in her brother; Stroke in her mother.  ROS:   Please see the history of present illness.    Review of Systems  Constitutional: Negative.   Respiratory: Negative.   Cardiovascular: Positive for chest pain.  Gastrointestinal: Negative.   Musculoskeletal: Positive for myalgias.  Neurological: Negative.   Psychiatric/Behavioral: Negative.   All other systems reviewed and are negative.    Labs/Other Tests and Data Reviewed:    Recent Labs: 08/11/2019: ALT 20; BUN 22; Creatinine, Ser 0.78; Hemoglobin  13.0; Platelets 298; Potassium 3.6; Sodium 138   Recent Lipid Panel Lab Results  Component Value Date/Time   CHOL 169 08/25/2018 09:11 AM   TRIG 73.0 08/25/2018 09:11 AM   HDL 79.40 08/25/2018 09:11 AM   CHOLHDL 2 08/25/2018 09:11 AM   LDLCALC 75 08/25/2018 09:11 AM    Wt Readings from Last 3 Encounters:  08/19/19 143 lb 6.4 oz (65 kg)  08/11/19 143 lb 6.4 oz (65 kg)  05/25/19 141 lb (64 kg)     Exam:   BP (!) 163/72 (BP Location: Left Arm,  Patient Position: Sitting, Cuff Size: Normal)   Pulse 78   Ht 5\' 4"  (1.626 m)   Wt 144 lb 12 oz (65.7 kg)   BMI 24.85 kg/m  Constitutional:  oriented to person, place, and time. No distress.  HENT:  Head: Grossly normal Eyes:  no discharge. No scleral icterus.  Neck: No JVD, no carotid bruits  Cardiovascular: Regular rate and rhythm, no murmurs appreciated Pulmonary/Chest: Clear to auscultation bilaterally, no wheezes or rails Abdominal: Soft.  no distension.  no tenderness.  Musculoskeletal: Normal range of motion Neurological:  normal muscle tone. Coordination normal. No atrophy Skin: Skin warm and dry Psychiatric: normal affect, pleasant   ASSESSMENT & PLAN:    PAD (peripheral artery disease) (HCC) Discussed prior carotid ultrasound with her Stressed the need for more aggressive lipid management Suggested she add Zetia to her Lipitor Repeat lipids with primary care February 2021  Bilateral carotid artery stenosis 60% on the right, slightly better on the left No dramatic change over the past several years We will add Zetia as above  Mixed hyperlipidemia Lipitor with Zetia, goal LDL 60  Essential hypertension Blood pressure elevated on today's visit, typically well controlled at home Just had surgery, still with some knee discomfort, long walk into the office No medication changes made, she is on amlodipine 5 with HCTZ Recommend she confirm her blood pressure readings with a different blood pressure cuff for consistency  or bring it into the office  Prediabetes We have encouraged continued exercise, careful diet management in an effort to lose weight.    Total encounter time more than 25 minutes  Greater than 50% was spent in counseling and coordination of care with the patient   Disposition: Follow-up in 6 months   Signed, Ida Rogue, MD  10/11/2019 1:34 PM    [place] Office [id number], [place], [place] [address]

## 2019-10-12 ENCOUNTER — Other Ambulatory Visit: Payer: Self-pay

## 2019-10-12 ENCOUNTER — Ambulatory Visit: Payer: Medicare Other | Admitting: Cardiovascular Disease

## 2019-10-12 ENCOUNTER — Encounter: Payer: Self-pay | Admitting: Cardiovascular Disease

## 2019-10-12 VITALS — BP 163/72 | HR 78 | Ht 64.0 in | Wt 144.8 lb

## 2019-10-12 DIAGNOSIS — I10 Essential (primary) hypertension: Secondary | ICD-10-CM

## 2019-10-12 DIAGNOSIS — I739 Peripheral vascular disease, unspecified: Secondary | ICD-10-CM

## 2019-10-12 DIAGNOSIS — E782 Mixed hyperlipidemia: Secondary | ICD-10-CM | POA: Diagnosis not present

## 2019-10-12 DIAGNOSIS — R7303 Prediabetes: Secondary | ICD-10-CM | POA: Diagnosis not present

## 2019-10-12 DIAGNOSIS — I6523 Occlusion and stenosis of bilateral carotid arteries: Secondary | ICD-10-CM

## 2019-10-12 MED ORDER — EZETIMIBE 10 MG PO TABS: 10.0000 mg | ORAL_TABLET | Freq: Every day | ORAL | 3 refills | Status: DC

## 2019-10-12 MED ORDER — AMLODIPINE BESYLATE 5 MG PO TABS: 5.0000 mg | ORAL_TABLET | Freq: Every day | ORAL | 3 refills | Status: DC

## 2019-10-12 NOTE — Patient Instructions (Addendum)
Medication Instructions:  Please start zetia 10 mg daily for cholesterol  Change script to amlodipine 5 mg daily   If you need a refill on your cardiac medications before your next appointment, please call your pharmacy.    Lab work: No new labs needed   If you have labs (blood work) drawn today and your tests are completely normal, you will receive your results only by: [name] MyChart Message (if you have MyChart) OR . A paper copy in the mail If you have any lab test that is abnormal or we need to change your treatment, we will call you to review the results.   Testing/Procedures: No new testing needed   Follow-Up: At [facility], you and your health needs are our priority.  As part of our continuing mission to provide you with exceptional heart care, we have created designated Provider Care Teams.  These Care Teams include your primary Cardiologist (physician) and Advanced Practice Providers (APPs -  Physician Assistants and Nurse Practitioners) who all work together to provide you with the care you need, when you need it.  . You will need a follow up appointment in 6 months   . Providers on your designated Care Team:   . Murray Hodgkins, NP . Christell Faith, PA-C . Marrianne Mood, PA-C  Any Other Special Instructions Will Be Listed Below (If Applicable).  For educational health videos Log in to : [email or web address] Or : [email or web address], password : [email or web address]

## 2019-10-28 ENCOUNTER — Other Ambulatory Visit: Payer: Self-pay | Admitting: Family

## 2019-11-26 ENCOUNTER — Other Ambulatory Visit: Payer: Self-pay | Admitting: Family

## 2019-11-26 ENCOUNTER — Ambulatory Visit (INDEPENDENT_AMBULATORY_CARE_PROVIDER_SITE_OTHER): Payer: Medicare Other | Admitting: Vascular Surgery

## 2019-11-26 ENCOUNTER — Encounter (INDEPENDENT_AMBULATORY_CARE_PROVIDER_SITE_OTHER): Payer: Medicare Other

## 2019-11-30 ENCOUNTER — Ambulatory Visit (INDEPENDENT_AMBULATORY_CARE_PROVIDER_SITE_OTHER): Payer: Medicare Other

## 2019-11-30 ENCOUNTER — Encounter: Payer: Self-pay | Admitting: Family

## 2019-11-30 ENCOUNTER — Ambulatory Visit (INDEPENDENT_AMBULATORY_CARE_PROVIDER_SITE_OTHER): Payer: Medicare Other | Admitting: Family

## 2019-11-30 ENCOUNTER — Other Ambulatory Visit: Payer: Self-pay

## 2019-11-30 VITALS — Ht 64.0 in | Wt 144.0 lb

## 2019-11-30 VITALS — BP 127/52 | HR 63 | Ht 64.0 in | Wt 144.0 lb

## 2019-11-30 DIAGNOSIS — M8580 Other specified disorders of bone density and structure, unspecified site: Secondary | ICD-10-CM

## 2019-11-30 DIAGNOSIS — E782 Mixed hyperlipidemia: Secondary | ICD-10-CM | POA: Diagnosis not present

## 2019-11-30 DIAGNOSIS — M25562 Pain in left knee: Secondary | ICD-10-CM

## 2019-11-30 DIAGNOSIS — F419 Anxiety disorder, unspecified: Secondary | ICD-10-CM

## 2019-11-30 DIAGNOSIS — M25561 Pain in right knee: Secondary | ICD-10-CM | POA: Diagnosis not present

## 2019-11-30 DIAGNOSIS — Z0000 Encounter for general adult medical examination without abnormal findings: Secondary | ICD-10-CM

## 2019-11-30 DIAGNOSIS — Z13820 Encounter for screening for osteoporosis: Secondary | ICD-10-CM

## 2019-11-30 DIAGNOSIS — I10 Essential (primary) hypertension: Secondary | ICD-10-CM

## 2019-11-30 DIAGNOSIS — E039 Hypothyroidism, unspecified: Secondary | ICD-10-CM

## 2019-11-30 DIAGNOSIS — G8929 Other chronic pain: Secondary | ICD-10-CM

## 2019-11-30 MED ORDER — MELOXICAM 7.5 MG PO TABS: 7.5000 mg | ORAL_TABLET | Freq: Every day | ORAL | 1 refills | Status: DC | PRN

## 2019-11-30 MED ORDER — DICLOFENAC SODIUM 1 % EX GEL: 4.0000 g | Freq: Four times a day (QID) | CUTANEOUS | 1 refills | Status: DC

## 2019-11-30 NOTE — Assessment & Plan Note (Addendum)
Controlled, continue regimen. Advised of CVD and increase of blood pressure on celebrex, she will try not to take it and see how she feels. See note on knee pain.

## 2019-11-30 NOTE — Progress Notes (Addendum)
Verbal consent for services obtained from patient prior to services given to TELEPHONE visit:   Location of call:  provider at work patient at home  Names of all persons present for services: Mary Paris, NP Chief complaint:  Feels well. Had left TKR 07/2019. 'doing great' with Dr Marry Guan. Recent steriod injection of right knee with some improvement. Taking celebrex daily. On tylenol daily.   HTN- compliant with medication. At home today, 127/52, HR 63. Denies exertional chest pain or pressure, numbness or tingling radiating to left arm or jaw, palpitations, dizziness, frequent headaches, changes in vision, or shortness of breath.   HLD- on zetia and lipitor  Hypothyroidism - compliant with medication  GAD- using xanax very rarely.   Dr Rockey Situ 09/2019 Added zetia  History, background, results pertinent:  Never smoker  A/P/next steps:  Problem List Items Addressed This Visit      Cardiovascular and Mediastinum   HTN (hypertension)    Controlled, continue regimen. Advised of CVD and increase of blood pressure on celebrex, she will try not to take it and see how she feels. See note on knee pain.         Endocrine   Hypothyroidism    Compliant medication, pending TSH        Musculoskeletal and Integument   Osteopenia    DEXA ordered.          Other   Anxiety    Very rare use xanax. Will continue.       Hyperlipidemia, unspecified - Primary   Relevant Orders   Hemoglobin A1c   Lipid panel   TSH   Knee pain, bilateral    Left knee pain has resolved since knee replacement, continues to have right knee pain.  Discussed different pain modalities.  Advised her to avoid topical modalities including ice, Voltaren gel.  She also has Salonpas pain patches at home.  Discontinued Celebrex .  I advised she may use meloxicam as needed, with the  hopes infrequently.  She will continue to use Tylenol.  She will let me know how she is doing      Relevant Medications   diclofenac Sodium (VOLTAREN) 1 % GEL   meloxicam (MOBIC) 7.5 MG tablet    Other Visit Diagnoses    Screening for osteoporosis       Relevant Orders   DG Bone Density     Patient will schedule DEXA at [facility].   I spent 20 min  discussing plan of care over the phone.

## 2019-11-30 NOTE — Assessment & Plan Note (Signed)
Compliant medication, pending TSH

## 2019-11-30 NOTE — Assessment & Plan Note (Signed)
Very rare use xanax. Will continue.

## 2019-11-30 NOTE — Assessment & Plan Note (Signed)
DEXA ordered.  

## 2019-11-30 NOTE — Patient Instructions (Addendum)
  Mary Pacheco , Thank you for taking time to come for your Medicare Wellness Visit. I appreciate your ongoing commitment to your health goals. Please review the following plan we discussed and let me know if I can assist you in the future.   These are the goals we discussed: Goals      Patient Stated   . DIET - INCREASE WATER INTAKE (pt-stated)      Other   . LIFESTYLE - DECREASE FALLS RISK     Monitor footing, slow the pace to prevent falls       This is a list of the screening recommended for you and due dates:  Health Maintenance  Topic Date Due  . Mammogram  07/05/2020  . Tetanus Vaccine  04/06/2024  . Flu Shot  Completed  . DEXA scan (bone density measurement)  Completed

## 2019-11-30 NOTE — Assessment & Plan Note (Addendum)
Left knee pain has resolved since knee replacement, continues to have right knee pain.  Discussed different pain modalities.  Advised her to avoid topical modalities including ice, Voltaren gel.  She also has Salonpas pain patches at home.  Discontinued Celebrex .  I advised she may use meloxicam as needed, with the  hopes infrequently.  She will continue to use Tylenol.  She will let me know how she is doing

## 2019-11-30 NOTE — Progress Notes (Addendum)
Subjective:   Mary Pacheco is a 79 y.o. female who presents for Medicare Annual (Subsequent) preventive examination.  Review of Systems:  No ROS.  Medicare Wellness Virtual Visit.  Visual/audio telehealth visit, UTA vital signs.   Ht/Wt provided.  See social history for additional risk factors.   Cardiac Risk Factors include: advanced age (>68men, >80 women);hypertension     Objective:     Vitals: BP (!) 127/52   Pulse 63   Ht 5\' 4"  (1.626 m)   Wt 144 lb (65.3 kg)   BMI 24.72 kg/m   Body mass index is 24.72 kg/m.  Advanced Directives 11/30/2019 08/19/2019 08/11/2019 11/26/2018 11/25/2017 11/22/2016 10/29/2016  Does Patient Have a Medical Advance Directive? Yes Yes Yes Yes Yes Yes Yes  Type of Advance Directive Living will [place];Living will [place];Living will [place];Living will [place];Living will Living will;Healthcare Power of [place];Living will  Does patient want to make changes to medical advance directive? No - Patient declined No - Patient declined No - Patient declined No - Patient declined No - Patient declined No - Patient declined -  Copy of [place] in Chart? - No - copy requested No - copy requested No - copy requested No - copy requested No - copy requested -    Tobacco Social History   Tobacco Use  Smoking Status Never Smoker  Smokeless Tobacco Never Used     Counseling given: Not Answered   Clinical Intake:  Pre-visit preparation completed: Yes        Diabetes: No  How often do you need to have someone help you when you read instructions, pamphlets, or other written materials from your doctor or pharmacy?: 1 - Never  Interpreter Needed?: No     Past Medical History:  Diagnosis Date  . Anxiety 2011   patient with anxiety will continue Alprazolam prn  . Arthritis   . GERD (gastroesophageal reflux disease)    pt. with esophagea reflux. She reports great improvement with Dexilant.  [name] Hearing loss    s/p hearing aids  . 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 Hyperlipidemia    patient with HL on statins. lipids well controlled  . Hypertension   . Hypothyroidism   . Pre-diabetes   . Skin cancer    Past Surgical History:  Procedure Laterality Date  . ABDOMINAL HYSTERECTOMY  1993   multiple fibroid cysts  . cataract surgery  02/2011   bilateral  . 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 skin cancer removal    . TONSILLECTOMY     Family History  Problem Relation Age of Onset  . Heart failure Mother   . Heart disease Mother        CABG  . Hypertension Mother   . Hyperlipidemia Mother   . Stroke Mother        mini strokes, multiple  . Heart attack Father   . Heart failure Father   . Hyperlipidemia Father   . Hypertension Father   . Heart attack Sister 23       MI  . Hernia Sister   . Prostate cancer Brother    Social History   Socioeconomic History  . Marital status: Married    Spouse name: Not on file  .  Number of children: Not on file  . Years of education: Not on file  . Highest education level: Not on file  Occupational History  . Not on file  Tobacco Use  . Smoking status: Never Smoker  . Smokeless tobacco: Never Used  Substance and Sexual Activity  . Alcohol use: Yes    Comment: socially  . Drug use: No  . Sexual activity: Yes    Birth control/protection: Post-menopausal  Other Topics Concern  . Not on file  Social History Narrative   From [employer]      Retired Education officer, museum      married   Social Determinants of Health   Financial Resource Strain: Low Risk   . Difficulty of Paying Living Expenses: Not hard at all  Food Insecurity: No Food Insecurity  . Worried About Charity fundraiser in the Last Year:  Never true  . Ran Out of Food in the Last Year: Never true  Transportation Needs: No Transportation Needs  . Lack of Transportation (Medical): No  . Lack of Transportation (Non-Medical): No  Physical Activity: Insufficiently Active  . Days of Exercise per Week: 7 days  . Minutes of Exercise per Session: 20 min  Stress: No Stress Concern Present  . Feeling of Stress : Not at all  Social Connections: Unknown  . Frequency of Communication with Friends and Family: More than three times a week  . Frequency of Social Gatherings with Friends and Family: More than three times a week  . Attends Religious Services: Not on file  . Active Member of Clubs or Organizations: Yes  . Attends Archivist Meetings: Not on file  . Marital Status: Married    Outpatient Encounter Medications as of 11/30/2019  Medication Sig  . acetaminophen (TYLENOL) 500 MG tablet Take 1,000 mg by mouth every 6 (six) hours as needed for moderate pain.  [name] acyclovir (ZOVIRAX) 400 MG tablet TAKE 1 TABLET BY MOUTH EVERY DAY  . ALPRAZolam (XANAX) 0.25 MG tablet TAKE 1 TABLET BY MOUTH TWICE A DAY AS NEEDED FOR ANXIETY (Patient taking differently: Take 0.125 mg by mouth daily as needed for anxiety. TAKE 1 TABLET BY MOUTH TWICE A DAY AS NEEDED FOR ANXIETY)  . amLODipine (NORVASC) 5 MG tablet Take 1 tablet (5 mg total) by mouth daily.  [name] Apoaequorin (PREVAGEN PO) Take 1 tablet by mouth daily.  [name] atorvastatin (LIPITOR) 20 MG tablet TAKE 1 TABLET BY MOUTH EVERY DAY  . celecoxib (CELEBREX) 200 MG capsule TAKE 1 CAPSULE BY MOUTH TWICE A DAY  . Cholecalciferol (VITAMIN D) 50 MCG (2000 UT) tablet Take 2,000 Units by mouth daily.   . Coenzyme Q10 (EQL COQ10) 200 MG capsule Take 200 mg by mouth daily.   [name] CRANBERRY PO Take 650 mg by mouth daily.  [name] ezetimibe (ZETIA) 10 MG tablet Take 1 tablet (10 mg total) by mouth daily.  . Flaxseed, Linseed, (FLAX SEED OIL PO) Take 1,400 mg by mouth daily.  Nyoka Cowden Tea, Camellia sinensis, (GREEN  TEA PO) Take 500 mg by mouth daily.   . hydrochlorothiazide (MICROZIDE) 12.5 MG capsule TAKE 1 CAPSULE DAILY (MUST ESTABLISH WITH NEW PRIMARY CARE PHYSICIAN)  . Misc Natural Products (TOTAL CARDIO HEALTH FORMULA PO) Take 1 tablet by mouth daily.    . Omega-3 Fatty Acids (FISH OIL TRIPLE STRENGTH) 1400 MG CAPS Take 1,400 mg by mouth daily.   [name] oxyCODONE (OXY IR/ROXICODONE) 5 MG immediate release tablet Take 1 tablet (5 mg total) by  mouth every 4 (four) hours as needed for moderate pain (pain score 4-6). (Patient not taking: Reported on 10/12/2019)  . pantoprazole (PROTONIX) 40 MG tablet TAKE 1 TABLET DAILY  . Probiotic Product (PROBIOTIC DAILY PO) Take 1 capsule by mouth daily.   [name] SYNTHROID 50 MCG tablet TAKE 1 TABLET DAILY   No facility-administered encounter medications on file as of 11/30/2019.    Activities of Daily Living In your present state of health, do you have any difficulty performing the following activities: 11/30/2019 08/19/2019  Hearing? Y N  Comment Hearing aid, bilateral -  Vision? N N  Difficulty concentrating or making decisions? N N  Walking or climbing stairs? N Y  Dressing or bathing? N N  Doing errands, shopping? N N  Preparing Food and eating ? N -  Using the Toilet? N -  In the past six months, have you accidently leaked urine? N -  Do you have problems with loss of bowel control? N -  Managing your Medications? N -  Managing your Finances? N -  Housekeeping or managing your Housekeeping? N -  Some recent data might be hidden    Patient Care Team: Burnard Hawthorne, FNP as PCP - General (Family Medicine) Minna Merritts, MD as Consulting Physician (Cardiology) Schnier, Dolores Lory, MD (Vascular Surgery) Margaretha Sheffield, MD (Otolaryngology)    Assessment:   This is a routine wellness examination for Yarlin.  Nurse connected with patient 11/30/19 at 10:30 AM EST by a telephone enabled telemedicine application and verified that I am speaking with the correct  person using two identifiers. Patient stated full name and DOB. Patient gave permission to continue with virtual visit. Patient's location was at home and Nurse's location was at [place] office.   Patient is alert and oriented x3. Patient denies difficulty focusing or concentrating. Patient likes to read and stay active for brain stimulation.   Health Maintenance Due: -See completed HM at the end of note.   Eye: Visual acuity not assessed. Virtual visit. Followed by their ophthalmologist.  Dental: Visits every 4 months.    Hearing: Demonstrates normal hearing during visit.  Safety:  Patient feels safe at home- yes Patient does have smoke detectors at home- yes Patient does wear sunscreen or protective clothing when in direct sunlight - yes Patient does wear seat belt when in a moving vehicle - yes Patient drives- yes Adequate lighting in walkways free from debris- yes Grab bars and handrails used as appropriate- yes Ambulates with an assistive device- no Cell phone on person when ambulating outside of the home- yes  Social: Alcohol intake - yes      Smoking history- never  Smokers in home? none Illicit drug use? none  Medication: Taking as directed and without issues.  Pill box in use -yes  Self managed - yes   Covid-19: Precautions and sickness symptoms discussed. Wears mask, social distancing, hand hygiene as appropriate.   Activities of Daily Living Patient denies needing assistance with: household chores, feeding themselves, getting from bed to chair, getting to the toilet, bathing/showering, dressing, managing money, or preparing meals.   Discussed the importance of a healthy diet, water intake and the benefits of aerobic exercise.   Physical activity- stationary bike daily for 10 minutes.  Diet:  Regular Water: fair intake  Other Providers Patient Care Team: Burnard Hawthorne, FNP as PCP - General (Family Medicine) Rockey Situ, Kathlene November, MD as Consulting  Physician (Cardiology) Schnier, Dolores Lory, MD (Vascular Surgery) Margaretha Sheffield, MD (  Otolaryngology)  Exercise Activities and Dietary recommendations Current Exercise Habits: Home exercise routine, Type of exercise: walking;strength training/weights, Time (Minutes): 20, Frequency (Times/Week): 5, Weekly Exercise (Minutes/Week): 100, Intensity: Mild  Goals      Patient Stated   . DIET - INCREASE WATER INTAKE (pt-stated)      Other   . LIFESTYLE - DECREASE FALLS RISK     Monitor footing, slow the pace to prevent falls       Fall Risk Fall Risk  11/30/2019 06/05/2019 11/26/2018 11/25/2017 11/22/2016  Falls in the past year? 0 0 0 Yes No  Number falls in past yr: - 0 - 1 -  Injury with Fall? - 0 - No -  Comment - - - Slipped on the pavement during winter weather  -  Follow up Falls evaluation completed Falls evaluation completed - - -   Timed Get Up and Go performed: no, virtual visit  Depression Screen PHQ 2/9 Scores 11/30/2019 06/05/2019 11/26/2018 11/25/2017  PHQ - 2 Score 0 0 0 0     Cognitive Function MMSE - Mini Mental State Exam 11/25/2017 11/22/2016 03/09/2015  Orientation to time 5 5 5   Orientation to Place 5 5 5   Registration 3 3 3   Attention/ Calculation 5 5 5   Recall 2 3 3   Language- name 2 objects 2 2 2   Language- repeat 1 1 1   Language- follow 3 step command 3 3 3   Language- read & follow direction 1 1 1   Write a sentence 1 1 1   Copy design 1 1 1   Total score 29 30 30      6CIT Screen 11/30/2019 11/26/2018  What Year? 0 points 0 points  What month? 0 points 0 points  What time? 0 points 0 points  Count back from 20 - 0 points  Months in reverse 0 points 2 points  Repeat phrase - 0 points  Total Score - 2    Immunization History  Administered Date(s) Administered  . Influenza Split 07/12/2011, 09/15/2012, 08/06/2014  . Influenza, High Dose Seasonal PF 07/18/2016, 07/15/2017, 08/20/2018, 08/03/2019  . Influenza-Unspecified 07/31/2013, 07/15/2017  . PFIZER SARS-COV-2  Vaccination 11/04/2019, 11/26/2019  . Pneumococcal Conjugate-13 04/06/2014  . Pneumococcal Polysaccharide-23 03/20/2009  . Tdap 04/06/2014  . Zoster 07/25/2013  . Zoster Recombinat (Shingrix) 04/03/2018, 08/07/2018   Screening Tests Health Maintenance  Topic Date Due  . MAMMOGRAM  07/05/2020  . TETANUS/TDAP  04/06/2024  . INFLUENZA VACCINE  Completed  . DEXA SCAN  Completed      Plan:   Keep all routine maintenance appointments.   Follow up with your doctor today at 11:00  Medicare Attestation I have personally reviewed: The patient's medical and social history Their use of alcohol, tobacco or illicit drugs Their current medications and supplements The patient's functional ability including ADLs,fall risks, home safety risks, cognitive, and hearing and visual impairment Diet and physical activities Evidence for depression   I have reviewed and discussed with patient certain preventive protocols, quality metrics, and best practice recommendations.     Varney Biles, LPN  [id number]    Agree with plan. Mable Paris, NP

## 2019-12-01 ENCOUNTER — Telehealth: Payer: Self-pay | Admitting: Family

## 2019-12-01 NOTE — Telephone Encounter (Signed)
Patient called about her referral for a bone density. Patient was advised that it takes time.

## 2019-12-07 ENCOUNTER — Other Ambulatory Visit: Payer: Medicare Other

## 2019-12-09 ENCOUNTER — Encounter: Payer: Self-pay | Admitting: Family

## 2019-12-09 ENCOUNTER — Other Ambulatory Visit (INDEPENDENT_AMBULATORY_CARE_PROVIDER_SITE_OTHER): Payer: Medicare Other

## 2019-12-09 ENCOUNTER — Other Ambulatory Visit: Payer: Self-pay

## 2019-12-09 DIAGNOSIS — E782 Mixed hyperlipidemia: Secondary | ICD-10-CM | POA: Diagnosis not present

## 2019-12-09 LAB — TSH: TSH: 1.96 u[IU]/mL (ref 0.35–4.50)

## 2019-12-09 LAB — LIPID PANEL
Cholesterol: 149 mg/dL (ref 0–200)
HDL: 75.4 mg/dL (ref >39.00)
LDL Cholesterol: 56 mg/dL (ref 0–99)
NonHDL: 73.14
Total CHOL/HDL Ratio: 2
Triglycerides: 85 mg/dL (ref 0.0–149.0)
VLDL: 17 mg/dL (ref 0.0–40.0)

## 2019-12-09 LAB — HEMOGLOBIN A1C: Hgb A1c MFr Bld: 6.8 % — ABNORMAL HIGH (ref 4.6–6.5)

## 2019-12-10 ENCOUNTER — Encounter (INDEPENDENT_AMBULATORY_CARE_PROVIDER_SITE_OTHER): Payer: Medicare Other

## 2019-12-10 ENCOUNTER — Ambulatory Visit (INDEPENDENT_AMBULATORY_CARE_PROVIDER_SITE_OTHER): Payer: Medicare Other | Admitting: Vascular Surgery

## 2019-12-18 ENCOUNTER — Other Ambulatory Visit: Payer: Self-pay | Admitting: Family

## 2019-12-18 DIAGNOSIS — M899 Disorder of bone, unspecified: Secondary | ICD-10-CM

## 2019-12-21 ENCOUNTER — Telehealth: Payer: Self-pay | Admitting: Family

## 2019-12-21 NOTE — Telephone Encounter (Signed)
Call patient Please advise her that a new order with a new diagnosis code has been sent to [facility] for a bone density.  Please advise her to call [facility] to schedule this.  Let us know if any problems

## 2019-12-21 NOTE — Telephone Encounter (Signed)
Patient called and informed that she could now call to scheduled DEXA at [facility]. Patient also wanted me to let you know that she had been taking the meloxicam about a week now every other day. She does feel that it works as well for her as the Celebrex did. She [place] still sticking with it though st this time.

## 2019-12-23 ENCOUNTER — Other Ambulatory Visit: Payer: Self-pay | Admitting: Family

## 2019-12-23 DIAGNOSIS — I10 Essential (primary) hypertension: Secondary | ICD-10-CM

## 2019-12-28 ENCOUNTER — Ambulatory Visit (INDEPENDENT_AMBULATORY_CARE_PROVIDER_SITE_OTHER): Payer: Medicare Other | Admitting: Vascular Surgery

## 2019-12-28 ENCOUNTER — Encounter (INDEPENDENT_AMBULATORY_CARE_PROVIDER_SITE_OTHER): Payer: Self-pay | Admitting: Vascular Surgery

## 2019-12-28 ENCOUNTER — Ambulatory Visit (INDEPENDENT_AMBULATORY_CARE_PROVIDER_SITE_OTHER): Payer: Medicare Other

## 2019-12-28 ENCOUNTER — Other Ambulatory Visit: Payer: Self-pay

## 2019-12-28 VITALS — BP 143/65 | HR 69 | Resp 16 | Wt 146.0 lb

## 2019-12-28 DIAGNOSIS — K219 Gastro-esophageal reflux disease without esophagitis: Secondary | ICD-10-CM

## 2019-12-28 DIAGNOSIS — I10 Essential (primary) hypertension: Secondary | ICD-10-CM | POA: Diagnosis not present

## 2019-12-28 DIAGNOSIS — I6523 Occlusion and stenosis of bilateral carotid arteries: Secondary | ICD-10-CM

## 2019-12-28 DIAGNOSIS — E782 Mixed hyperlipidemia: Secondary | ICD-10-CM

## 2019-12-28 DIAGNOSIS — I739 Peripheral vascular disease, unspecified: Secondary | ICD-10-CM

## 2019-12-30 ENCOUNTER — Encounter (INDEPENDENT_AMBULATORY_CARE_PROVIDER_SITE_OTHER): Payer: Self-pay | Admitting: Vascular Surgery

## 2019-12-30 ENCOUNTER — Encounter: Payer: Self-pay | Admitting: Family

## 2019-12-30 NOTE — Progress Notes (Signed)
MRN : [id number]  Mary Pacheco is a 79 y.o. (August 16, 1941) female who presents with chief complaint of  Chief Complaint  Patient presents with  . Follow-up    ultrasound follow up  .  History of Present Illness:   The patient is seen for follow up evaluation of carotid stenosis. The carotid stenosis followed by ultrasound.   The patient denies amaurosis fugax. There is no recent history of TIA symptoms or focal motor deficits. There is no prior documented CVA.  The patient is taking enteric-coated aspirin 81 mg daily.  There is no history of migraine headaches. There is no history of seizures.  The patient has a history of coronary artery disease, no recent episodes of angina or shortness of breath. The patient denies PAD or claudication symptoms. There is a history of hyperlipidemia which is being treated with a statin.    Carotid Duplex done today shows 40-59% RICA and <40%.  Increased stenosis of the right compared to last study in 11/20/2018  Current Meds  Medication Sig  . acetaminophen (TYLENOL) 500 MG tablet Take 1,000 mg by mouth every 6 (six) hours as needed for moderate pain.  [name] acyclovir (ZOVIRAX) 400 MG tablet TAKE 1 TABLET BY MOUTH EVERY DAY  . ALPRAZolam (XANAX) 0.25 MG tablet TAKE 1 TABLET BY MOUTH TWICE A DAY AS NEEDED FOR ANXIETY (Patient taking differently: Take 0.125 mg by mouth daily as needed for anxiety. TAKE 1 TABLET BY MOUTH TWICE A DAY AS NEEDED FOR ANXIETY)  . amLODipine (NORVASC) 5 MG tablet Take 1 tablet (5 mg total) by mouth daily.  [name] Apoaequorin (PREVAGEN PO) Take 1 tablet by mouth daily.  [name] atorvastatin (LIPITOR) 20 MG tablet TAKE 1 TABLET BY MOUTH EVERY DAY  . Cholecalciferol (VITAMIN D) 50 MCG (2000 UT) tablet Take 2,000 Units by mouth daily.   . Coenzyme Q10 (EQL COQ10) 200 MG capsule Take 200 mg by mouth daily.   [name] CRANBERRY PO Take 650 mg by mouth daily.  . diclofenac Sodium (VOLTAREN) 1 % GEL Apply 4 g topically 4 (four) times daily.  [name]  ezetimibe (ZETIA) 10 MG tablet Take 1 tablet (10 mg total) by mouth daily.  . Flaxseed, Linseed, (FLAX SEED OIL PO) Take 1,400 mg by mouth daily.  [place] Cowden Tea, Camellia sinensis, (GREEN TEA PO) Take 500 mg by mouth daily.   . hydrochlorothiazide (MICROZIDE) 12.5 MG capsule TAKE 1 CAPSULE DAILY (MUST ESTABLISH WITH NEW PRIMARY CARE PHYSICIAN)  . meloxicam (MOBIC) 7.5 MG tablet Take 1 tablet (7.5 mg total) by mouth daily as needed for pain. Take with food  . Misc Natural Products (TOTAL CARDIO HEALTH FORMULA PO) Take 1 tablet by mouth daily.    . Omega-3 Fatty Acids (FISH OIL TRIPLE STRENGTH) 1400 MG CAPS Take 1,400 mg by mouth daily.   . pantoprazole (PROTONIX) 40 MG tablet TAKE 1 TABLET DAILY  . Probiotic Product (PROBIOTIC DAILY PO) Take 1 capsule by mouth daily.   [name] SYNTHROID 50 MCG tablet TAKE 1 TABLET DAILY    Past Medical History:  Diagnosis Date  . Anxiety 2011   patient with anxiety will continue Alprazolam prn  . Arthritis   . GERD (gastroesophageal reflux disease)    pt. with esophagea reflux. She reports great improvement with Dexilant.  [name] Hearing loss    s/p hearing aids  . 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 Hyperlipidemia    patient with HL on statins. lipids well controlled  . Hypertension   .  Hypothyroidism   . Pre-diabetes   . Skin cancer     Past Surgical History:  Procedure Laterality Date  . ABDOMINAL HYSTERECTOMY  1993   multiple fibroid cysts  . cataract surgery  02/2011   bilateral  . 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 skin cancer removal    . TONSILLECTOMY      Social History Social History   Tobacco Use  . Smoking status: Never Smoker  . Smokeless tobacco: Never Used  Substance Use Topics  . Alcohol use: Yes    Comment: socially  . Drug use: No    Family History Family History  Problem Relation  Age of Onset  . Heart failure Mother   . Heart disease Mother        CABG  . Hypertension Mother   . Hyperlipidemia Mother   . Stroke Mother        mini strokes, multiple  . Heart attack Father   . Heart failure Father   . Hyperlipidemia Father   . Hypertension Father   . Heart attack Sister 65       MI  . Hernia Sister   . Prostate cancer Brother     Allergies  Allergen Reactions  . Molds & Smuts Other (See Comments)    Induces asthma Induces asthma  . Rosuvastatin Hives  . Simvastatin     myalgia     REVIEW OF SYSTEMS (Negative unless checked)  Constitutional: [] Weight loss  [] Fever  [] Chills Cardiac: [] Chest pain   [] Chest pressure   [] Palpitations   [] Shortness of breath when laying flat   [] Shortness of breath with exertion. Vascular:  [x] Pain in legs with walking   [] Pain in legs at rest  [] History of DVT   [] Phlebitis   [] Swelling in legs   [] Varicose veins   [] Non-healing ulcers Pulmonary:   [] Uses home oxygen   [] Productive cough   [] Hemoptysis   [] Wheeze  [] COPD   [] Asthma Neurologic:  [] Dizziness   [] Seizures   [] History of stroke   [] History of TIA  [] Aphasia   [] Vissual changes   [] Weakness or numbness in arm   [] Weakness or numbness in leg Musculoskeletal:   [] Joint swelling   [] Joint pain   [] Low back pain Hematologic:  [] Easy bruising  [] Easy bleeding   [] Hypercoagulable state   [] Anemic Gastrointestinal:  [] Diarrhea   [] Vomiting  [x] Gastroesophageal reflux/heartburn   [] Difficulty swallowing. Genitourinary:  [] Chronic kidney disease   [] Difficult urination  [] Frequent urination   [] Blood in urine Skin:  [] Rashes   [] Ulcers  Psychological:  [] History of anxiety   []  History of major depression.  Physical Examination  Vitals:   12/28/19 1447  BP: (!) 143/65  Pulse: 69  Resp: 16  Weight: 146 lb (66.2 kg)   Body mass index is 25.06 kg/m. Gen: WD/WN, NAD Head: [place]/AT, No temporalis wasting.  Ear/Nose/Throat: Hearing grossly intact, nares w/o erythema  or drainage Eyes: PER, EOMI, sclera nonicteric.  Neck: Supple, no large masses.   Pulmonary:  Good air movement, no audible wheezing bilaterally, no use of accessory muscles.  Cardiac: RRR, no JVD Vascular: right carotid bruit Vessel Right Left  Radial Palpable Palpable  Brachial Palpable Palpable  Carotid Palpable Palpable  PT Not Palpable Not Palpable  DP Not Palpable Not Palpable  Gastrointestinal: Non-distended. No guarding/no peritoneal signs.  Musculoskeletal: M/S 5/5 throughout.  No deformity or atrophy.  Neurologic: CN 2-12 intact. Symmetrical.  Speech is fluent. Motor exam as listed above. Psychiatric: Judgment intact, Mood & affect appropriate for pt's clinical situation. Dermatologic: No rashes or ulcers noted.  No changes consistent with cellulitis.  CBC Lab Results  Component Value Date   WBC 6.9 08/11/2019   HGB 13.0 08/11/2019   HCT 40.2 08/11/2019   MCV 93.5 08/11/2019   PLT 298 08/11/2019    BMET    Component Value Date/Time   NA 138 08/11/2019 1142   NA 140 12/28/2011 1819   K 3.6 08/11/2019 1142   K 3.2 (L) 12/28/2011 1819   CL 102 08/11/2019 1142   CL 104 12/28/2011 1819   CO2 27 08/11/2019 1142   CO2 27 12/28/2011 1819   GLUCOSE 114 (H) 08/11/2019 1142   GLUCOSE 98 12/28/2011 1819   BUN 22 08/11/2019 1142   BUN 13 12/28/2011 1819   CREATININE 0.78 08/11/2019 1142   CREATININE 1.00 11/06/2013 0854   CALCIUM 9.8 08/11/2019 1142   CALCIUM 9.1 12/28/2011 1819   GFRNONAA >60 08/11/2019 1142   GFRNONAA 56 (L) 11/06/2013 0854   GFRAA >60 08/11/2019 1142   GFRAA >60 11/06/2013 0854   CrCl cannot be calculated (Patient's most recent lab result is older than the maximum 21 days allowed.).  COAG Lab Results  Component Value Date   INR 1.0 08/11/2019    Radiology VAS US CAROTID  Result Date: 12/28/2019 Carotid Arterial Duplex Study Indications:       Carotid artery disease and right stenosis. Comparison Study:  05/25/2019; No significant change  from prior study. Performing Technologist: Almira Coaster RVS  Examination Guidelines: A complete evaluation includes B-mode imaging, spectral Doppler, color Doppler, and power Doppler as needed of all accessible portions of each vessel. Bilateral testing is considered an integral part of a complete examination. Limited examinations for reoccurring indications may be performed as noted.  Right Carotid Findings: +----------+-------+-------+--------+---------------------------------+--------+           PSV    EDV    StenosisPlaque Description               Comments           cm/s   cm/s                                                     +----------+-------+-------+--------+---------------------------------+--------+ CCA Prox  103    12                                                       +----------+-------+-------+--------+---------------------------------+--------+ CCA Mid   105    13                                                       +----------+-------+-------+--------+---------------------------------+--------+ CCA Distal107    16             heterogenous, irregular and  calcific                                  +----------+-------+-------+--------+---------------------------------+--------+ ICA Prox  250    34             heterogenous, irregular and                                               calcific                                  +----------+-------+-------+--------+---------------------------------+--------+ ICA Mid   195    34                                                       +----------+-------+-------+--------+---------------------------------+--------+ ICA Distal150    27                                                       +----------+-------+-------+--------+---------------------------------+--------+ ECA       193    0                                                         +----------+-------+-------+--------+---------------------------------+--------+ +----------+--------+-------+--------+-------------------+           PSV cm/sEDV cmsDescribeArm Pressure (mmHG) +----------+--------+-------+--------+-------------------+ Subclavian145     0                                  +----------+--------+-------+--------+-------------------+ +---------+--------+--+--------+--+ VertebralPSV cm/s70EDV cm/s11 +---------+--------+--+--------+--+  Left Carotid Findings: +----------+--------+--------+--------+------------------+--------+           PSV cm/sEDV cm/sStenosisPlaque DescriptionComments +----------+--------+--------+--------+------------------+--------+ CCA Prox  103     11                                         +----------+--------+--------+--------+------------------+--------+ CCA Mid   109     12                                         +----------+--------+--------+--------+------------------+--------+ CCA Distal99      15                                         +----------+--------+--------+--------+------------------+--------+ ICA Prox  77      13                                         +----------+--------+--------+--------+------------------+--------+  ICA Mid   90      20                                         +----------+--------+--------+--------+------------------+--------+ ICA Distal99      20                                         +----------+--------+--------+--------+------------------+--------+ ECA       129     0                                          +----------+--------+--------+--------+------------------+--------+ +----------+--------+--------+--------+-------------------+           PSV cm/sEDV cm/sDescribeArm Pressure (mmHG) +----------+--------+--------+--------+-------------------+ Subclavian215     0                                    +----------+--------+--------+--------+-------------------+  +---------+--------+--+--------+--+ VertebralPSV cm/s69EDV cm/s10 +---------+--------+--+--------+--+   Summary: Right Carotid: Velocities in the right ICA are consistent with a 40-59%                stenosis. Left Carotid: Velocities in the left ICA are consistent with a 1-39% stenosis. Vertebrals:  Bilateral vertebral arteries demonstrate antegrade flow. Subclavians: Normal flow hemodynamics were seen in bilateral subclavian              arteries. *See table(s) above for measurements and observations.  Electronically signed by Hortencia Pilar MD on 12/28/2019 at 5:15:26 PM.    Final      Assessment/Plan 1. Bilateral carotid artery stenosis Recommend:  Given the patient's asymptomatic subcritical stenosis no further invasive testing or surgery at this time.  Duplex ultrasound shows 123456 RICA and [id number] LICA stenosis bilaterally.  Continue antiplatelet therapy as prescribed Continue management of CAD, HTN and Hyperlipidemia Healthy heart diet,  encouraged exercise at least 4 times per week.  Follow up in 12 months with duplex ultrasound and physical  - VAS US CAROTID; Future  2. PAD (peripheral artery disease) (HCC)  Recommend:  The patient has evidence of atherosclerosis of the lower extremities with claudication.  The patient does not voice lifestyle limiting changes at this point in time.  Noninvasive studies do not suggest clinically significant change.  No invasive studies, angiography or surgery at this time The patient should continue walking and begin a more formal exercise program.  The patient should continue antiplatelet therapy and aggressive treatment of the lipid abnormalities  No changes in the patient's medications at this time  The patient should continue wearing graduated compression socks 10-15 mmHg strength to control the mild edema.   - VAS [place] ABI WITH/WO TBI; Future  3. Essential  hypertension Continue antihypertensive medications as already ordered, these medications have been reviewed and there are no changes at this time.   4. Gastroesophageal reflux disease, unspecified whether esophagitis present Continue PPI as already ordered, this medication has been reviewed and there are no changes at this time.  Avoidence of caffeine and alcohol  Moderate elevation of the head of the bed   5. Mixed hyperlipidemia Continue statin as ordered and reviewed, no changes at this time  Hortencia Pilar, MD  12/30/2019 12:30 PM

## 2020-01-04 ENCOUNTER — Telehealth: Payer: Self-pay | Admitting: Family

## 2020-01-04 NOTE — Telephone Encounter (Signed)
Call patient Please sch follow up to discuss therapy for osteopenia based on FRAX score.   Please let her know that her bone density scan was normal of lumbar and actually improved. Femur shows osteopenia.   our fracture risk for the next 10 years is 5.3% for your hip and 17% for major osteoporotic fracture.   Guidelines support starting medication therapy based on the following:   . A hip or vertebral (clinical or morphometric) fracture  . T-score ? -2.5 at the femoral neck or spine after appropriate evaluation to exclude secondary causes  . Low bone mass (T-score between -1.0 and -2.5 at the femoral neck or spine) and a 10-year probability of a hip fracture ? 3% or a 10-year probability of a major osteoporosis-related fracture ? 20% based on the [place]-adapted WHO algorithm  . Clinicians judgment and/or patient preferences may indicate treatment for people with 10-year fracture probabilities above or below these levels  Please make an appointment so we can discuss medication therapy and what you would prefer to do.  We need to follow this and monitor again in 2 more years to see if improved or worsened with another DEXA scan.  For post menopausal women, guidelines recommend a diet with 1200 mg of Calcium per day. If you are eating calcium rich foods, you do not need a calcium supplement. The body better absorbs the calcium that you eat over supplementation. If you do supplement, I recommend not supplementing the full 1200 mg/ day as this can lead to increased risk of cardiovascular disease. I recommend Calcium Citrate over the counter, and you may take a total of 600 to 800 mg per day in divided doses with meals for best absorption.   For bone health, you need adequate vitamin D, and I recommend you supplement as it is harder to do so with diet alone. I recommend cholecalciferol 800 units daily.  Also, please ensure you are following a diet high in calcium -- research shows better outcomes with  dietary sources including kale, yogurt, broccolii, cheese, okra, almonds- to name a few.     Also remember that exercise is a great medicine for maintain and preserve bone health. Advise moderate exercise for 30 minutes , 3 times per week.

## 2020-01-04 NOTE — Telephone Encounter (Signed)
LM to advise on below & schedule patient for appointment to discuss.

## 2020-01-06 NOTE — Telephone Encounter (Signed)
LM once again for patient to call back.  

## 2020-01-06 NOTE — Telephone Encounter (Signed)
Pt called back returning your call °

## 2020-01-07 NOTE — Telephone Encounter (Signed)
Pt called back returning your call °

## 2020-01-07 NOTE — Telephone Encounter (Signed)
Patient was in a restaurant when I called & she could not hear. She will call back when she can.

## 2020-01-12 NOTE — Telephone Encounter (Signed)
Pt returning call about bone density test

## 2020-01-14 NOTE — Telephone Encounter (Signed)
Pt called again to get results

## 2020-01-14 NOTE — Telephone Encounter (Signed)
Patient informed and verbalized understanding.  Pt has an appointment 05/10, Asked if pt would like to move this up, she states she will call back once she knows her schedule.

## 2020-01-20 ENCOUNTER — Telehealth: Payer: Self-pay

## 2020-01-20 DIAGNOSIS — E119 Type 2 diabetes mellitus without complications: Secondary | ICD-10-CM

## 2020-01-20 NOTE — Telephone Encounter (Signed)
Patient called and wanted to have labs done before appointment on 5/10. She likes to be able to ask you questions about labs because she doesn't always understand the jargon. What does she need ordered & will get patient scheduled?

## 2020-01-25 NOTE — Telephone Encounter (Signed)
Pt scheduled got a1c one week prior.

## 2020-01-25 NOTE — Telephone Encounter (Signed)
Call pt She may have a1c done ahead of appt I dont see any other labs that she needs at this time

## 2020-01-25 NOTE — Addendum Note (Signed)
Addended by: Burnard Hawthorne on: 01/25/2020 10:11 AM   Modules accepted: Orders

## 2020-01-30 ENCOUNTER — Other Ambulatory Visit: Payer: Self-pay | Admitting: Family

## 2020-02-22 ENCOUNTER — Other Ambulatory Visit: Payer: Self-pay

## 2020-02-22 ENCOUNTER — Other Ambulatory Visit (INDEPENDENT_AMBULATORY_CARE_PROVIDER_SITE_OTHER): Payer: Medicare Other

## 2020-02-22 DIAGNOSIS — E119 Type 2 diabetes mellitus without complications: Secondary | ICD-10-CM

## 2020-02-22 LAB — HEMOGLOBIN A1C: Hgb A1c MFr Bld: 5.6 % (ref 4.6–6.5)

## 2020-02-26 LAB — HM DIABETES EYE EXAM

## 2020-02-29 ENCOUNTER — Other Ambulatory Visit: Payer: Self-pay

## 2020-02-29 ENCOUNTER — Ambulatory Visit: Payer: Medicare Other | Admitting: Nurse Practitioner

## 2020-02-29 ENCOUNTER — Encounter: Payer: Self-pay | Admitting: Nurse Practitioner

## 2020-02-29 ENCOUNTER — Ambulatory Visit: Payer: Medicare Other | Admitting: Family

## 2020-02-29 VITALS — BP 154/68 | HR 81 | Temp 98.2°F | Ht 64.0 in | Wt 141.6 lb

## 2020-02-29 DIAGNOSIS — J309 Allergic rhinitis, unspecified: Secondary | ICD-10-CM | POA: Diagnosis not present

## 2020-02-29 DIAGNOSIS — I10 Essential (primary) hypertension: Secondary | ICD-10-CM | POA: Diagnosis not present

## 2020-02-29 DIAGNOSIS — R600 Localized edema: Secondary | ICD-10-CM | POA: Diagnosis not present

## 2020-02-29 LAB — CBC WITH DIFFERENTIAL/PLATELET
Basophils Absolute: 0 10*3/uL (ref 0.0–0.1)
Basophils Relative: 0.7 % (ref 0.0–3.0)
Eosinophils Absolute: 0.1 10*3/uL (ref 0.0–0.7)
Eosinophils Relative: 1.6 % (ref 0.0–5.0)
HCT: 36.7 % (ref 36.0–46.0)
Hemoglobin: 11.7 g/dL — ABNORMAL LOW (ref 12.0–15.0)
Lymphocytes Relative: 20.7 % (ref 12.0–46.0)
Lymphs Abs: 1.2 10*3/uL (ref 0.7–4.0)
MCHC: 32 g/dL (ref 30.0–36.0)
MCV: 88.7 fl (ref 78.0–100.0)
Monocytes Absolute: 0.4 10*3/uL (ref 0.1–1.0)
Monocytes Relative: 7.7 % (ref 3.0–12.0)
Neutro Abs: 3.9 10*3/uL (ref 1.4–7.7)
Neutrophils Relative %: 69.3 % (ref 43.0–77.0)
Platelets: 311 10*3/uL (ref 150.0–400.0)
RBC: 4.13 Mil/uL (ref 3.87–5.11)
RDW: 13.9 % (ref 11.5–15.5)
WBC: 5.7 10*3/uL (ref 4.0–10.5)

## 2020-02-29 LAB — COMPREHENSIVE METABOLIC PANEL
ALT: 13 U/L (ref 0–35)
AST: 18 U/L (ref 0–37)
Albumin: 4.8 g/dL (ref 3.5–5.2)
Alkaline Phosphatase: 84 U/L (ref 39–117)
BUN: 20 mg/dL (ref 6–23)
CO2: 26 mEq/L (ref 19–32)
Calcium: 9.8 mg/dL (ref 8.4–10.5)
Chloride: 99 mEq/L (ref 96–112)
Creatinine, Ser: 0.78 mg/dL (ref 0.40–1.20)
GFR: 71.14 mL/min (ref >60.00)
Glucose, Bld: 105 mg/dL — ABNORMAL HIGH (ref 70–99)
Potassium: 3.8 mEq/L (ref 3.5–5.1)
Sodium: 134 mEq/L — ABNORMAL LOW (ref 135–145)
Total Bilirubin: 0.6 mg/dL (ref 0.2–1.2)
Total Protein: 7.1 g/dL (ref 6.0–8.3)

## 2020-02-29 LAB — MICROALBUMIN / CREATININE URINE RATIO
Creatinine,U: 49.4 mg/dL
Microalb Creat Ratio: 2.8 mg/g (ref 0.0–30.0)
Microalb, Ur: 1.4 mg/dL (ref 0.0–1.9)

## 2020-02-29 NOTE — Patient Instructions (Addendum)
It was very nice to meet you today.  Please go to the lab today.  I have ordered an ultrasound of your right lower extremity to investigate the swelling.  We will make a decision on your blood pressure medicine after looking at today's labs.  For your post nasal drip, throat clearing and ear fullness:  OK to use your Nasocort spray and plain Claritin for post nasal drip, Do not use the decongestant as that will raise your BP.     Managing Your Hypertension Hypertension is commonly called high blood pressure. This is when the force of your blood pressing against the walls of your arteries is too strong. Arteries are blood vessels that carry blood from your heart throughout your body. Hypertension forces the heart to work harder to pump blood, and may cause the arteries to become narrow or stiff. Having untreated or uncontrolled hypertension can cause heart attack, stroke, kidney disease, and other problems. What are blood pressure readings? A blood pressure reading consists of a higher number over a lower number. Ideally, your blood pressure should be below 120/80. The first ("top") number is called the systolic pressure. It is a measure of the pressure in your arteries as your heart beats. The second ("bottom") number is called the diastolic pressure. It is a measure of the pressure in your arteries as the heart relaxes. What does my blood pressure reading mean? Blood pressure is classified into four stages. Based on your blood pressure reading, your health care provider may use the following stages to determine what type of treatment you need, if any. Systolic pressure and diastolic pressure are measured in a unit called mm Hg. Normal  Systolic pressure: below 123456.  Diastolic pressure: below 80. Elevated  Systolic pressure: [id number].  Diastolic pressure: below 80. Hypertension stage 1  Systolic pressure: 0000000.  Diastolic pressure: [id number]. Hypertension stage 2  Systolic pressure:  [id number] or above.  Diastolic pressure: 90 or above. What health risks are associated with hypertension? Managing your hypertension is an important responsibility. Uncontrolled hypertension can lead to:  A heart attack.  A stroke.  A weakened blood vessel (aneurysm).  Heart failure.  Kidney damage.  Eye damage.  Metabolic syndrome.  Memory and concentration problems. What changes can I make to manage my hypertension? Hypertension can be managed by making lifestyle changes and possibly by taking medicines. Your health care provider will help you make a plan to bring your blood pressure within a normal range. Eating and drinking   Eat a diet that is high in fiber and potassium, and low in salt (sodium), added sugar, and fat. An example eating plan is called the DASH (Dietary Approaches to Stop Hypertension) diet. To eat this way: ? Eat plenty of fresh fruits and vegetables. Try to fill half of your plate at each meal with fruits and vegetables. ? Eat whole grains, such as whole wheat pasta, brown rice, or whole grain bread. Fill about one quarter of your plate with whole grains. ? Eat low-fat diary products. ? Avoid fatty cuts of meat, processed or cured meats, and poultry with skin. Fill about one quarter of your plate with lean proteins such as fish, chicken without skin, beans, eggs, and tofu. ? Avoid premade and processed foods. These tend to be higher in sodium, added sugar, and fat.  Reduce your daily sodium intake. Most people with hypertension should eat less than 1,500 mg of sodium a day.  Limit alcohol intake to no more than 1 drink a  day for nonpregnant women and 2 drinks a day for men. One drink equals 12 oz of beer, 5 oz of wine, or 1 oz of hard liquor. Lifestyle  Work with your health care provider to maintain a healthy body weight, or to lose weight. Ask what an ideal weight is for you.  Get at least 30 minutes of exercise that causes your heart to beat faster  (aerobic exercise) most days of the week. Activities may include walking, swimming, or biking.  Include exercise to strengthen your muscles (resistance exercise), such as weight lifting, as part of your weekly exercise routine. Try to do these types of exercises for 30 minutes at least 3 days a week.  Do not use any products that contain nicotine or tobacco, such as cigarettes and e-cigarettes. If you need help quitting, ask your health care provider.  Control any long-term (chronic) conditions you have, such as high cholesterol or diabetes. Monitoring  Monitor your blood pressure at home as told by your health care provider. Your personal target blood pressure may vary depending on your medical conditions, your age, and other factors.  Have your blood pressure checked regularly, as often as told by your health care provider. Working with your health care provider  Review all the medicines you take with your health care provider because there may be side effects or interactions.  Talk with your health care provider about your diet, exercise habits, and other lifestyle factors that may be contributing to hypertension.  Visit your health care provider regularly. Your health care provider can help you create and adjust your plan for managing hypertension. Will I need medicine to control my blood pressure? Your health care provider may prescribe medicine if lifestyle changes are not enough to get your blood pressure under control, and if:  Your systolic blood pressure is 130 or higher.  Your diastolic blood pressure is 80 or higher. Take medicines only as told by your health care provider. Follow the directions carefully. Blood pressure medicines must be taken as prescribed. The medicine does not work as well when you skip doses. Skipping doses also puts you at risk for problems. Contact a health care provider if:  You think you are having a reaction to medicines you have taken.  You have  repeated (recurrent) headaches.  You feel dizzy.  You have swelling in your ankles.  You have trouble with your vision. Get help right away if:  You develop a severe headache or confusion.  You have unusual weakness or numbness, or you feel faint.  You have severe pain in your chest or abdomen.  You vomit repeatedly.  You have trouble breathing. Summary  Hypertension is when the force of blood pumping through your arteries is too strong. If this condition is not controlled, it may put you at risk for serious complications.  Your personal target blood pressure may vary depending on your medical conditions, your age, and other factors. For most people, a normal blood pressure is less than 120/80.  Hypertension is managed by lifestyle changes, medicines, or both. Lifestyle changes include weight loss, eating a healthy, low-sodium diet, exercising more, and limiting alcohol. This information is not intended to replace advice given to you by your health care provider. Make sure you discuss any questions you have with your health care provider. Document Revised: 01/30/2019 Document Reviewed: 09/05/2016 Elsevier Patient Education  [place]. Allergic Rhinitis, Adult Allergic rhinitis is an allergic reaction that affects the mucous membrane inside the nose.  It causes sneezing, a runny or stuffy nose, and the feeling of mucus going down the back of the throat (postnasal drip). Allergic rhinitis can be mild to severe. There are two types of allergic rhinitis:  Seasonal. This type is also called hay fever. It happens only during certain seasons.  Perennial. This type can happen at any time of the year. What are the causes? This condition happens when the body's defense system (immune system) responds to certain harmless substances called allergens as though they were germs.  Seasonal allergic rhinitis is triggered by pollen, which can come from grasses, trees, and weeds. Perennial  allergic rhinitis may be caused by:  House dust mites.  Pet dander.  Mold spores. What are the signs or symptoms? Symptoms of this condition include:  Sneezing.  Runny or stuffy nose (nasal congestion).  Postnasal drip.  Itchy nose.  Tearing of the eyes.  Trouble sleeping.  Daytime sleepiness. How is this diagnosed? This condition may be diagnosed based on:  Your medical history.  A physical exam.  Tests to check for related conditions, such as: ? Asthma. ? Pink eye. ? Ear infection. ? Upper respiratory infection.  Tests to find out which allergens trigger your symptoms. These may include skin or blood tests. How is this treated? There is no cure for this condition, but treatment can help control symptoms. Treatment may include:  Taking medicines that block allergy symptoms, such as antihistamines. Medicine may be given as a shot, nasal spray, or pill.  Avoiding the allergen.  Desensitization. This treatment involves getting ongoing shots until your body becomes less sensitive to the allergen. This treatment may be done if other treatments do not help.  If taking medicine and avoiding the allergen does not work, new, stronger medicines may be prescribed. Follow these instructions at home:  Find out what you are allergic to. Common allergens include smoke, dust, and pollen.  Avoid the things you are allergic to. These are some things you can do to help avoid allergens: ? Replace carpet with wood, tile, or vinyl flooring. Carpet can trap dander and dust. ? Do not smoke. Do not allow smoking in your home. ? Change your heating and air conditioning filter at least once a month. ? During allergy season:  Keep windows closed as much as possible.  Plan outdoor activities when pollen counts are lowest. This is usually during the evening hours.  When coming indoors, change clothing and shower before sitting on furniture or bedding.  Take over-the-counter and  prescription medicines only as told by your health care provider.  Keep all follow-up visits as told by your health care provider. This is important. Contact a health care provider if:  You have a fever.  You develop a persistent cough.  You make whistling sounds when you breathe (you wheeze).  Your symptoms interfere with your normal daily activities. Get help right away if:  You have shortness of breath. Summary  This condition can be managed by taking medicines as directed and avoiding allergens.  Contact your health care provider if you develop a persistent cough or fever.  During allergy season, keep windows closed as much as possible. This information is not intended to replace advice given to you by your health care provider. Make sure you discuss any questions you have with your health care provider. Document Revised: 09/20/2017 Document Reviewed: 11/15/2016 Elsevier Patient Education  2020 [employer].

## 2020-02-29 NOTE — Progress Notes (Signed)
Established Patient Office Visit  Subjective:  Patient ID: Mary Pacheco, female    DOB: 1941-02-19  Age: 79 y.o. MRN: [id number]  CC:  Chief Complaint  Patient presents with  . Acute Visit    leg swelling    HPI Mary Pacheco presents for lower extremity edema that is worse in the evening, and much improved in the morning after she gets out of bed.  She has been experiencing swelling of the right leg greater than the left leg.  She has had knee pain, and did have a knee injection and x-ray which helps about 2 weeks.  She plans to get with orthopedics as her knee is still bothering her. The swelling is still present. No calf pain, airplane rides, long distance drives.She is active and on the go so she is on her feet. When at home, she elevates her legs and that helps some. She did not have swelling on the amlodipine 2.5 mg and since it was increased to 5 mg- for a few months- and that seems to be when the swelling started. She rushed to get here and her BP is up.  She has been checking her BP and brings in BID readings- see below. She is pleased with them.   Past Medical History:  Diagnosis Date  . Anxiety 2011   patient with anxiety will continue Alprazolam prn  . Arthritis   . GERD (gastroesophageal reflux disease)    pt. with esophagea reflux. She reports great improvement with Dexilant.  [name] Hearing loss    s/p hearing aids  . 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 Hyperlipidemia    patient with HL on statins. lipids well controlled  . Hypertension   . Hypothyroidism   . Pre-diabetes   . Skin cancer     Past Surgical History:  Procedure Laterality Date  . ABDOMINAL HYSTERECTOMY  1993   multiple fibroid cysts  . cataract surgery  02/2011   bilateral  . 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 skin cancer removal    . TONSILLECTOMY       Family History  Problem Relation Age of Onset  . Heart failure Mother   . Heart disease Mother        CABG  . Hypertension Mother   . Hyperlipidemia Mother   . Stroke Mother        mini strokes, multiple  . Heart attack Father   . Heart failure Father   . Hyperlipidemia Father   . Hypertension Father   . Heart attack Sister 63       MI  . Hernia Sister   . Prostate cancer Brother     Social History   Socioeconomic History  . Marital status: Married    Spouse name: Not on file  . Number of children: Not on file  . Years of education: Not on file  . Highest education level: Not on file  Occupational History  . Not on file  Tobacco Use  . Smoking status: Never Smoker  . Smokeless tobacco: Never Used  Substance and Sexual Activity  . Alcohol use: Yes    Comment: socially  . Drug use: No  . Sexual activity: Yes    Birth control/protection: Post-menopausal  Other Topics Concern  . Not on file  Social History Narrative   From [employer]  Retired Education officer, museum      married   Social Determinants of Radio broadcast assistant Strain: Low Risk   . Difficulty of Paying Living Expenses: Not hard at all  Food Insecurity: No Food Insecurity  . Worried About Charity fundraiser in the Last Year: Never true  . Ran Out of Food in the Last Year: Never true  Transportation Needs: No Transportation Needs  . Lack of Transportation (Medical): No  . Lack of Transportation (Non-Medical): No  Physical Activity: Insufficiently Active  . Days of Exercise per Week: 7 days  . Minutes of Exercise per Session: 20 min  Stress: No Stress Concern Present  . Feeling of Stress : Not at all  Social Connections: Unknown  . Frequency of Communication with Friends and Family: More than three times a week  . Frequency of Social Gatherings with Friends and Family: More than three times a week  . Attends Religious Services: Not on file  . Active Member of Clubs or  Organizations: Yes  . Attends Archivist Meetings: Not on file  . Marital Status: Married  Human resources officer Violence: Not At Risk  . Fear of Current or Ex-Partner: No  . Emotionally Abused: No  . Physically Abused: No  . Sexually Abused: No    Outpatient Medications Prior to Visit  Medication Sig Dispense Refill  . acetaminophen (TYLENOL) 500 MG tablet Take 1,000 mg by mouth every 6 (six) hours as needed for moderate pain.    [name] acyclovir (ZOVIRAX) 400 MG tablet TAKE 1 TABLET BY MOUTH EVERY DAY 90 tablet 2  . ALPRAZolam (XANAX) 0.25 MG tablet TAKE 1 TABLET BY MOUTH TWICE A DAY AS NEEDED FOR ANXIETY (Patient taking differently: Take 0.125 mg by mouth daily as needed for anxiety. TAKE 1 TABLET BY MOUTH TWICE A DAY AS NEEDED FOR ANXIETY) 30 tablet 1  . amLODipine (NORVASC) 5 MG tablet Take 1 tablet (5 mg total) by mouth daily. 90 tablet 3  . Apoaequorin (PREVAGEN PO) Take 1 tablet by mouth daily.    [name] atorvastatin (LIPITOR) 20 MG tablet TAKE 1 TABLET BY MOUTH EVERY DAY 90 tablet 1  . BAYER ASPIRIN EC LOW DOSE 81 MG EC tablet     . Cholecalciferol (VITAMIN D) 50 MCG (2000 UT) tablet Take 2,000 Units by mouth daily.     . Coenzyme Q10 (EQL COQ10) 200 MG capsule Take 200 mg by mouth daily.     [name] CRANBERRY PO Take 650 mg by mouth daily.    [name] ezetimibe (ZETIA) 10 MG tablet Take 1 tablet (10 mg total) by mouth daily. 90 tablet 3  . Flaxseed, Linseed, (FLAX SEED OIL PO) Take 1,400 mg by mouth daily.    Nyoka Cowden Tea, Camellia sinensis, (GREEN TEA PO) Take 500 mg by mouth daily.     . hydrochlorothiazide (MICROZIDE) 12.5 MG capsule TAKE 1 CAPSULE DAILY (MUST ESTABLISH WITH NEW PRIMARY CARE PHYSICIAN) 90 capsule 4  . Misc Natural Products (TOTAL CARDIO HEALTH FORMULA PO) Take 1 tablet by mouth daily.      . Omega-3 Fatty Acids (FISH OIL TRIPLE STRENGTH) 1400 MG CAPS Take 1,400 mg by mouth daily.     . pantoprazole (PROTONIX) 40 MG tablet TAKE 1 TABLET DAILY 90 tablet 0  . Probiotic Product  (PROBIOTIC DAILY PO) Take 1 capsule by mouth daily.     [name] SYNTHROID 50 MCG tablet TAKE 1 TABLET DAILY 90 tablet 4  . diclofenac Sodium (VOLTAREN) 1 %  GEL Apply 4 g topically 4 (four) times daily. 50 g 1  . meloxicam (MOBIC) 7.5 MG tablet Take 1 tablet (7.5 mg total) by mouth daily as needed for pain. Take with food (Patient not taking: Reported on 02/29/2020) 90 tablet 1   No facility-administered medications prior to visit.    Allergies  Allergen Reactions  . Molds & Smuts Other (See Comments)    Induces asthma Induces asthma  . Rosuvastatin Hives  . Simvastatin     myalgia    ROS Review of Systems  Constitutional: Negative for chills and fever.  HENT: Positive for postnasal drip. Negative for congestion.        She gets a feeling of inner fullness. Hx of vertigo- not active.  She is taking Claritin-D at rare intervals.  She does not take decongestants as they can increase her blood pressure.  She has Nasacort at home which she is thought about using.  Respiratory: Negative for cough and shortness of breath.   Cardiovascular: Positive for leg swelling. Negative for chest pain and palpitations.  Gastrointestinal: Negative for abdominal pain.  Allergic/Immunologic: Positive for environmental allergies.  Neurological: Negative for dizziness.      Objective:    Physical Exam  Constitutional: She is oriented to person, place, and time. She appears well-developed and well-nourished.  HENT:  Head: Normocephalic and atraumatic.  Right Ear: External ear and ear canal normal. Tympanic membrane is not bulging.  Left Ear: External ear and ear canal normal. Tympanic membrane is not bulging.  She wears hearing aids. TM- bilat  are normal. No cerumen.   Eyes: Pupils are equal, round, and reactive to light. Conjunctivae and EOM are normal.  Cardiovascular: Normal rate, regular rhythm and normal heart sounds.  Pulmonary/Chest: Effort normal and breath sounds normal. She has no wheezes. She  has no rales.  Abdominal: Soft. There is no abdominal tenderness.  Musculoskeletal:        General: Edema present. Normal range of motion.     Cervical back: Normal range of motion and neck supple.     Comments: Bilateral lower extremity with edema, right > left and patient reports this is not new. DP pulses present bilat.   Neurological: She is alert and oriented to person, place, and time.  Skin: Skin is warm and dry. No rash noted. No erythema.  Psychiatric: She has a normal mood and affect. Her behavior is normal. Judgment and thought content normal.  Vitals reviewed.   BP (!) 154/68   Pulse 81   Temp 98.2 F (36.8 C) (Skin)   Ht '5\' 4"'  (1.626 m)   Wt 141 lb 9.6 oz (64.2 kg)   SpO2 99%   BMI 24.31 kg/m  Wt Readings from Last 3 Encounters:  02/29/20 141 lb 9.6 oz (64.2 kg)  12/28/19 146 lb (66.2 kg)  11/30/19 144 lb (65.3 kg)     There are no preventive care reminders to display for this patient.  There are no preventive care reminders to display for this patient.  Lab Results  Component Value Date   TSH 1.96 12/09/2019   Lab Results  Component Value Date   WBC 5.7 02/29/2020   HGB 11.7 (L) 02/29/2020   HCT 36.7 02/29/2020   MCV 88.7 02/29/2020   PLT 311.0 02/29/2020   Lab Results  Component Value Date   NA 134 (L) 02/29/2020   K 3.8 02/29/2020   CO2 26 02/29/2020   GLUCOSE 105 (H) 02/29/2020   BUN 20 02/29/2020  CREATININE 0.78 02/29/2020   BILITOT 0.6 02/29/2020   ALKPHOS 84 02/29/2020   AST 18 02/29/2020   ALT 13 02/29/2020   PROT 7.1 02/29/2020   ALBUMIN 4.8 02/29/2020   CALCIUM 9.8 02/29/2020   ANIONGAP 9 08/11/2019   GFR 71.14 02/29/2020   Lab Results  Component Value Date   CHOL 149 12/09/2019   Lab Results  Component Value Date   HDL 75.40 12/09/2019   Lab Results  Component Value Date   LDLCALC 56 12/09/2019   Lab Results  Component Value Date   TRIG 85.0 12/09/2019   Lab Results  Component Value Date   CHOLHDL 2 12/09/2019     Lab Results  Component Value Date   HGBA1C 5.6 02/22/2020      Assessment & Plan:   Problem List Items Addressed This Visit      Cardiovascular and Mediastinum   HTN (hypertension)   Relevant Medications   BAYER ASPIRIN EC LOW DOSE 81 MG EC tablet   Other Relevant Orders   Comp Met (CMET) (Completed)   CBC with Differential/Platelet (Completed)   Urine Microalbumin w/creat. ratio (Completed)     Respiratory   Rhinitis, allergic     Other   Lower extremity edema - Primary   Relevant Orders   VAS [place] LOWER EXTREMITY VENOUS (DVT)     I have ordered an ultrasound of your right lower extremity to investigate the swelling.  We will make a decision on your blood pressure medicine after looking at today's labs.Keep appt with your PCP to address the amlodipine with lower extremity with ankle swelling.   For your post nasal drip, throat clearing and ear fullness:  OK to use your Nasocort spray and plain Claritin for post nasal drip, Do not use the decongestant as that will raise your BP.    I have ordered a ultrasound of the right lower extremity because of asymmetry.  Patient reports edema is less  in the morning, so she could try compression stockings 20 to 30 mmHg.  Her lungs are CTA, comfortable laying flat, no cough.  Follow-up: Return in about 1 week (around 03/07/2020).   This visit occurred during the SARS-CoV-2 public health emergency.  Safety protocols were in place, including screening questions prior to the visit, additional usage of staff PPE, and extensive cleaning of exam room while observing appropriate contact time as indicated for disinfecting solutions.     Denice Paradise, NP

## 2020-03-01 ENCOUNTER — Telehealth: Payer: Self-pay | Admitting: Family

## 2020-03-01 ENCOUNTER — Other Ambulatory Visit (INDEPENDENT_AMBULATORY_CARE_PROVIDER_SITE_OTHER): Payer: Medicare Other

## 2020-03-01 DIAGNOSIS — R600 Localized edema: Secondary | ICD-10-CM | POA: Diagnosis not present

## 2020-03-01 LAB — IBC + FERRITIN
Ferritin: 25.3 ng/mL (ref 10.0–291.0)
Iron: 96 ug/dL (ref 42–145)
Saturation Ratios: 17.8 % — ABNORMAL LOW (ref 20.0–50.0)
Transferrin: 386 mg/dL — ABNORMAL HIGH (ref 212.0–360.0)

## 2020-03-01 NOTE — Telephone Encounter (Signed)
Spoke with patient to try and get her to move [place] appt up. Patient states she has no availability this week but on the 17th can do after 3pm or the 18th after noon.

## 2020-03-01 NOTE — Addendum Note (Signed)
Addended by: Denice Paradise A on: 03/01/2020 12:34 PM   Modules accepted: Orders

## 2020-03-01 NOTE — Telephone Encounter (Signed)
I left pt vm with appt time date and location. 05/17 @ 10:15am at [facility]

## 2020-03-01 NOTE — Telephone Encounter (Signed)
I left pt a vm to call ofc to sch VAS [place].

## 2020-03-04 NOTE — Telephone Encounter (Signed)
Pt is scheduled on 03/07/2020 at 10:45 am

## 2020-03-07 ENCOUNTER — Other Ambulatory Visit: Payer: Self-pay

## 2020-03-07 ENCOUNTER — Ambulatory Visit
Admission: RE | Admit: 2020-03-07 | Discharge: 2020-03-07 | Disposition: A | Discharge status: Home / Self Care | Payer: Medicare Other | Source: Ambulatory Visit | Attending: Nurse Practitioner | Admitting: Nurse Practitioner

## 2020-03-07 DIAGNOSIS — R600 Localized edema: Secondary | ICD-10-CM

## 2020-03-11 ENCOUNTER — Other Ambulatory Visit: Payer: Self-pay | Admitting: Family

## 2020-03-11 ENCOUNTER — Ambulatory Visit: Payer: Medicare Other | Admitting: Family

## 2020-03-11 ENCOUNTER — Encounter: Payer: Self-pay | Admitting: Family

## 2020-03-11 ENCOUNTER — Other Ambulatory Visit: Payer: Self-pay

## 2020-03-11 VITALS — BP 144/62 | HR 73 | Temp 96.8°F | Ht 62.95 in | Wt 139.6 lb

## 2020-03-11 DIAGNOSIS — I951 Orthostatic hypotension: Secondary | ICD-10-CM | POA: Insufficient documentation

## 2020-03-11 DIAGNOSIS — R519 Headache, unspecified: Secondary | ICD-10-CM

## 2020-03-11 DIAGNOSIS — D649 Anemia, unspecified: Secondary | ICD-10-CM | POA: Insufficient documentation

## 2020-03-11 DIAGNOSIS — G8929 Other chronic pain: Secondary | ICD-10-CM

## 2020-03-11 DIAGNOSIS — I10 Essential (primary) hypertension: Secondary | ICD-10-CM

## 2020-03-11 MED ORDER — FERROUS SULFATE 325 (65 FE) MG PO TBEC: 325.0000 mg | DELAYED_RELEASE_TABLET | Freq: Two times a day (BID) | ORAL | 2 refills | Status: DC

## 2020-03-11 MED ORDER — AMLODIPINE BESYLATE 2.5 MG PO TABS
2.5000 mg | ORAL_TABLET | Freq: Every day | ORAL | 3 refills | Status: DC
Start: 2020-03-11 — End: 2020-04-06

## 2020-03-11 MED ORDER — LOSARTAN POTASSIUM 25 MG PO TABS: 25.0000 mg | ORAL_TABLET | Freq: Every day | ORAL | 1 refills | Status: DC

## 2020-03-11 NOTE — Patient Instructions (Addendum)
Decrease amlodpine 2.5mg  due to swelling  Your blood pressure is very fluctuant, and I think this is in part contributed to your headaches and dizziness.  I would like to trial small dose of losartan to see if this helps with symptoms above, certainly because it makes you further lightheaded or dizzy, call me let me know immediately.  Please be careful with position changes.  Ensure plenty plenty of water  LABS in one week  Stool cards- please return.  Start iron and take with orange juice. Start colace stool softener as well.   Tylenol may be causing medication overuse headache.    Orthostatic Hypotension Blood pressure is a measurement of how strongly, or weakly, your blood is pressing against the walls of your arteries. Orthostatic hypotension is a sudden drop in blood pressure that happens when you quickly change positions, such as when you get up from sitting or lying down. Arteries are blood vessels that carry blood from your heart throughout your body. When blood pressure is too low, you may not get enough blood to your brain or to the rest of your organs. This can cause weakness, light-headedness, rapid heartbeat, and fainting. This can last for just a few seconds or for up to a few minutes. Orthostatic hypotension is usually not a serious problem. However, if it happens frequently or gets worse, it may be a sign of something more serious. What are the causes? This condition may be caused by:  Sudden changes in posture, such as standing up quickly after you have been sitting or lying down.  Blood loss.  Loss of body fluids (dehydration).  Heart problems.  Hormone (endocrine) problems.  Pregnancy.  Severe infection.  Lack of certain nutrients.  Severe allergic reactions (anaphylaxis).  Certain medicines, such as blood pressure medicine or medicines that make the body lose excess fluids (diuretics). Sometimes, this condition can be caused by not taking medicine as  directed, such as taking too much of a certain medicine. What increases the risk? The following factors may make you more likely to develop this condition:  Age. Risk increases as you get older.  Conditions that affect the heart or the central nervous system.  Taking certain medicines, such as blood pressure medicine or diuretics.  Being pregnant. What are the signs or symptoms? Symptoms of this condition may include:  Weakness.  Light-headedness.  Dizziness.  Blurred vision.  Fatigue.  Rapid heartbeat.  Fainting, in severe cases. How is this diagnosed? This condition is diagnosed based on:  Your medical history.  Your symptoms.  Your blood pressure measurement. Your health care provider will check your blood pressure when you are: ? Lying down. ? Sitting. ? Standing. A blood pressure reading is recorded as two numbers, such as "120 over 80" (or 120/80). The first ("top") number is called the systolic pressure. It is a measure of the pressure in your arteries as your heart beats. The second ("bottom") number is called the diastolic pressure. It is a measure of the pressure in your arteries when your heart relaxes between beats. Blood pressure is measured in a unit called mm Hg. Healthy blood pressure for most adults is 120/80. If your blood pressure is below 90/60, you may be diagnosed with hypotension. Other information or tests that may be used to diagnose orthostatic hypotension include:  Your other vital signs, such as your heart rate and temperature.  Blood tests.  Tilt table test. For this test, you will be safely secured to a table that  moves you from a lying position to an upright position. Your heart rhythm and blood pressure will be monitored during the test. How is this treated? This condition may be treated by:  Changing your diet. This may involve eating more salt (sodium) or drinking more water.  Taking medicines to raise your blood  pressure.  Changing the dosage of certain medicines you are taking that might be lowering your blood pressure.  Wearing compression stockings. These stockings help to prevent blood clots and reduce swelling in your legs. In some cases, you may need to go to the hospital for:  Fluid replacement. This means you will receive fluids through an IV.  Blood replacement. This means you will receive donated blood through an IV (transfusion).  Treating an infection or heart problems, if this applies.  Monitoring. You may need to be monitored while medicines that you are taking wear off. Follow these instructions at home: Eating and drinking   Drink enough fluid to keep your urine pale yellow.  Eat a healthy diet, and follow instructions from your health care provider about eating or drinking restrictions. A healthy diet includes: ? Fresh fruits and vegetables. ? Whole grains. ? Lean meats. ? Low-fat dairy products.  Eat extra salt only as directed. Do not add extra salt to your diet unless your health care provider told you to do that.  Eat frequent, small meals.  Avoid standing up suddenly after eating. Medicines  Take over-the-counter and prescription medicines only as told by your health care provider. ? Follow instructions from your health care provider about changing the dosage of your current medicines, if this applies. ? Do not stop or adjust any of your medicines on your own. General instructions   Wear compression stockings as told by your health care provider.  Get up slowly from lying down or sitting positions. This gives your blood pressure a chance to adjust.  Avoid hot showers and excessive heat as directed by your health care provider.  Return to your normal activities as told by your health care provider. Ask your health care provider what activities are safe for you.  Do not use any products that contain nicotine or tobacco, such as cigarettes, e-cigarettes, and  chewing tobacco. If you need help quitting, ask your health care provider.  Keep all follow-up visits as told by your health care provider. This is important. Contact a health care provider if you:  Vomit.  Have diarrhea.  Have a fever for more than 2-3 days.  Feel more thirsty than usual.  Feel weak and tired. Get help right away if you:  Have chest pain.  Have a fast or irregular heartbeat.  Develop numbness in any part of your body.  Cannot move your arms or your legs.  Have trouble speaking.  Become sweaty or feel light-headed.  Faint.  Feel short of breath.  Have trouble staying awake.  Feel confused. Summary  Orthostatic hypotension is a sudden drop in blood pressure that happens when you quickly change positions.  Orthostatic hypotension is usually not a serious problem.  It is diagnosed by having your blood pressure taken lying down, sitting, and then standing.  It may be treated by changing your diet or adjusting your medicines. This information is not intended to replace advice given to you by your health care provider. Make sure you discuss any questions you have with your health care provider. Document Revised: 04/03/2018 Document Reviewed: 04/03/2018 Elsevier Patient Education  [place].

## 2020-03-11 NOTE — Assessment & Plan Note (Addendum)
See vital flow sheet. Complicated by elevated HTN and HA. Decrease amlodipine due to swelling. Trial of losartan 25mg  . Education in regards to careful with position changes,  Drinking more fluid.

## 2020-03-11 NOTE — Assessment & Plan Note (Addendum)
Slight. Question whether early IDA. Trial Iron in setting of symptomatic ( fatigue, dizziness). Pending stool cards. Close follow up.

## 2020-03-11 NOTE — Assessment & Plan Note (Signed)
Complicated as  patient's blood pressure is not controlled and I am concerned that might be contributing to headaches, however patient also exhibits signs that she is orthostatic.  We will carefully adjust her medication dose starting with losartan 25 mg and monitor dizziness, headache to see if improved.  We will decrease amlodipine due to 2.5 mg to lower extremity swelling, although improved

## 2020-03-11 NOTE — Progress Notes (Signed)
Subjective:    Patient ID: Mary Pacheco, female    DOB: [birth year], 79 y.o.   MRN: [id number]  CC: Mary Pacheco is a 79 y.o. female who presents today for follow up.   HPI: Complains of fatigue and dizziness, off and on for the past 3 months.  Endorses  HA which  is more often on the right side. HA's have been daily the last month. They are not severe. No flashing lights, vomiting, nausea.  Typically occur with dizziness. Doesn't notice HA when with other people or distracted.   Takes tylenol daily for right knee pain; follows with Dr Sharlet Salina.   Doesn't have dizziness when with other people or when changes postions. She feels notices it more when she is alone. More notices when 'rushing' such as getting dressed.  When doesn't rush and try to relax. No dizziness when rolls over in bed.  No vertigo, vision changes, exertional chest pain or pressure, numbness or tingling radiating to left arm or jaw, palpitations, dizziness,  changes in vision, or shortness of breath.   NO blood in stool, coffee ground stools.  H/o GIB.   Colonoscopy 2015    HTN- compliant with amlodipine, hctz.  No CP.   LE ankle edema has improved. Worse on right side.  Started magnesium 400mg  QD for the past 2 weeks,  with resolution of swelling. Swelling has been worse as day would go on and when standing.    HLD- on zetia and lipitor, ldl 56.    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  Left Carotid: Velocities in the left ICA are consistent with a 1-39%  stenosis.   Dr Rockey Situ 09/2019 HISTORY:  Past Medical History:  Diagnosis Date  . Anxiety 2011   patient with anxiety will continue Alprazolam prn  . Arthritis   . GERD (gastroesophageal reflux disease)    pt. with esophagea reflux. She reports great improvement with Dexilant.  [name] Hearing loss    s/p  hearing aids  . 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 Hyperlipidemia    patient with HL on statins. lipids well controlled  . Hypertension   . Hypothyroidism   . Pre-diabetes   . Skin cancer    Past Surgical History:  Procedure Laterality Date  . ABDOMINAL HYSTERECTOMY  1993   multiple fibroid cysts  . cataract surgery  02/2011   bilateral  . 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 skin cancer removal    . TONSILLECTOMY     Family History  Problem Relation Age of Onset  . Heart failure Mother   . Heart disease Mother        CABG  . Hypertension Mother   . Hyperlipidemia Mother   . Stroke Mother        mini strokes, multiple  . Heart attack Father   . Heart failure Father   . Hyperlipidemia Father   . Hypertension Father   . Heart attack Sister 6       MI  . Hernia Sister   . Prostate cancer Brother     Allergies: Molds & smuts, Rosuvastatin, and Simvastatin Current Outpatient Medications on File Prior to Visit  Medication  Sig Dispense Refill  . acetaminophen (TYLENOL) 500 MG tablet Take 1,000 mg by mouth every 6 (six) hours as needed for moderate pain.    [name] acyclovir (ZOVIRAX) 400 MG tablet TAKE 1 TABLET BY MOUTH EVERY DAY 90 tablet 2  . ALPRAZolam (XANAX) 0.25 MG tablet TAKE 1 TABLET BY MOUTH TWICE A DAY AS NEEDED FOR ANXIETY (Patient taking differently: Take 0.125 mg by mouth daily as needed for anxiety. TAKE 1 TABLET BY MOUTH TWICE A DAY AS NEEDED FOR ANXIETY) 30 tablet 1  . amLODipine (NORVASC) 5 MG tablet Take 1 tablet (5 mg total) by mouth daily. 90 tablet 3  . Apoaequorin (PREVAGEN PO) Take 1 tablet by mouth daily.    [name] atorvastatin (LIPITOR) 20 MG tablet TAKE 1 TABLET BY MOUTH EVERY DAY 90 tablet 1  . BAYER ASPIRIN EC LOW DOSE 81 MG EC tablet     . Cholecalciferol (VITAMIN D) 50 MCG (2000 UT) tablet Take 2,000 Units by  mouth daily.     . Coenzyme Q10 (EQL COQ10) 200 MG capsule Take 200 mg by mouth daily.     [name] CRANBERRY PO Take 650 mg by mouth daily.    [name] ezetimibe (ZETIA) 10 MG tablet Take 1 tablet (10 mg total) by mouth daily. 90 tablet 3  . Flaxseed, Linseed, (FLAX SEED OIL PO) Take 1,400 mg by mouth daily.    Nyoka Cowden Tea, Camellia sinensis, (GREEN TEA PO) Take 500 mg by mouth daily.     . hydrochlorothiazide (MICROZIDE) 12.5 MG capsule TAKE 1 CAPSULE DAILY (MUST ESTABLISH WITH NEW PRIMARY CARE PHYSICIAN) 90 capsule 4  . magnesium oxide (MAG-OX) 400 MG tablet Take 400 mg by mouth daily.    . meloxicam (MOBIC) 7.5 MG tablet Take 1 tablet (7.5 mg total) by mouth daily as needed for pain. Take with food 90 tablet 1  . Misc Natural Products (TOTAL CARDIO HEALTH FORMULA PO) Take 1 tablet by mouth daily.      . Omega-3 Fatty Acids (FISH OIL TRIPLE STRENGTH) 1400 MG CAPS Take 1,400 mg by mouth daily.     . pantoprazole (PROTONIX) 40 MG tablet TAKE 1 TABLET DAILY 90 tablet 0  . Probiotic Product (PROBIOTIC DAILY PO) Take 1 capsule by mouth daily.     [name] SYNTHROID 50 MCG tablet TAKE 1 TABLET DAILY 90 tablet 4   No current facility-administered medications on file prior to visit.    Social History   Tobacco Use  . Smoking status: Never Smoker  . Smokeless tobacco: Never Used  Substance Use Topics  . Alcohol use: Yes    Comment: socially  . Drug use: No    Review of Systems  Constitutional: Positive for fatigue. Negative for chills and fever.  Eyes: Negative for visual disturbance.  Respiratory: Negative for cough and shortness of breath.   Cardiovascular: Positive for leg swelling. Negative for chest pain and palpitations.  Gastrointestinal: Negative for nausea and vomiting.  Neurological: Positive for dizziness and headaches.      Objective:    BP (!) 144/62   Pulse 73   Temp (!) 96.8 F (36 C) (Temporal)   Ht 5' 2.95" (1.599 m)   Wt 139 lb 9.6 oz (63.3 kg)   SpO2 99%   BMI 24.77 kg/m  BP  Readings from Last 3 Encounters:  03/11/20 (!) 144/62  02/29/20 (!) 154/68  12/28/19 (!) 143/65   Wt Readings from Last 3 Encounters:  03/11/20 139 lb 9.6 oz (63.3 kg)  02/29/20  141 lb 9.6 oz (64.2 kg)  12/28/19 146 lb (66.2 kg)    Physical Exam Vitals reviewed.  Constitutional:      Appearance: She is well-developed.  HENT:     Mouth/Throat:     Pharynx: Uvula midline.  Eyes:     Conjunctiva/sclera: Conjunctivae normal.     Pupils: Pupils are equal, round, and reactive to light.     Comments: Fundus normal bilaterally.   Cardiovascular:     Rate and Rhythm: Normal rate and regular rhythm.     Pulses: Normal pulses.     Heart sounds: Normal heart sounds.     Comments: No LE edema, palpable cords or masses. No erythema or increased warmth. No asymmetry in calf size when compared bilaterally LE hair growth symmetric and present. No discoloration of varicosities noted. LE warm and palpable pedal pulses.  Pulmonary:     Effort: Pulmonary effort is normal.     Breath sounds: Normal breath sounds. No wheezing, rhonchi or rales.  Skin:    General: Skin is warm and dry.  Neurological:     Mental Status: She is alert.     Cranial Nerves: No cranial nerve deficit.     Sensory: No sensory deficit.     Deep Tendon Reflexes:     Reflex Scores:      Bicep reflexes are 2+ on the right side and 2+ on the left side.      Patellar reflexes are 2+ on the right side and 2+ on the left side.    Comments: Grip equal and strong bilateral upper extremities. Gait strong and steady. Able to perform rapid alternating movement without difficulty.   Psychiatric:        Speech: Speech normal.        Behavior: Behavior normal.        Thought Content: Thought content normal.        Assessment & Plan:   Problem List Items Addressed This Visit      Cardiovascular and Mediastinum   HTN (hypertension)    Complicated as  patient's blood pressure is not controlled and I am concerned that might  be contributing to headaches, however patient also exhibits signs that she is orthostatic.  We will carefully adjust her medication dose starting with losartan 25 mg and monitor dizziness, headache to see if improved.  We will decrease amlodipine due to 2.5 mg to lower extremity swelling, although improved      Relevant Medications   losartan (COZAAR) 25 MG tablet   Other Relevant Orders   Basic metabolic panel   Orthostatic hypotension    See vital flow sheet. Complicated by elevated HTN and HA. Decrease amlodipine due to swelling. Trial of losartan 25mg  . Education in regards to careful with position changes,  Drinking more fluid.        Relevant Medications   losartan (COZAAR) 25 MG tablet     Other   Anemia - Primary    Slight. Question whether early IDA. Trial Iron in setting of symptomatic ( fatigue, dizziness). Pending stool cards. Close follow up.      Relevant Medications   ferrous sulfate 325 (65 FE) MG EC tablet   Other Relevant Orders   Fecal occult blood, imunochemical   Headache    Reassuring neurologic exam. No HA today. Discussed that elevated blood pressure or daily Tylenol use might be exacerbating headache.  Patient  declines any further imaging at this time.  Close follow up.  Of note: we discuss osteopenia at follow up due to nature of items we discussed today, we did not have time to discuss  I am having [name] "Bethena Roys" start on ferrous sulfate and losartan. I am also having her maintain her Misc Natural Products (TOTAL CARDIO HEALTH FORMULA PO), Vitamin D, (Flaxseed, Linseed, (FLAX SEED OIL PO)), Fish Oil Triple Strength, Coenzyme Q10, Probiotic Product (PROBIOTIC DAILY PO), (Green Tea, Camellia sinensis, (GREEN TEA PO)), ALPRAZolam, acetaminophen, CRANBERRY PO, Apoaequorin (PREVAGEN PO), Synthroid, atorvastatin, ezetimibe, amLODipine, hydrochlorothiazide, acyclovir, meloxicam, pantoprazole, Bayer Aspirin EC Low Dose, and magnesium  oxide.   Meds ordered this encounter  Medications  . ferrous sulfate 325 (65 FE) MG EC tablet    Sig: Take 1 tablet (325 mg total) by mouth 2 (two) times daily with a meal.    Dispense:  60 tablet    Refill:  2    Order Specific Question:   Supervising Provider    Answer:   Deborra Medina L [[id number]]  . losartan (COZAAR) 25 MG tablet    Sig: Take 1 tablet (25 mg total) by mouth daily.    Dispense:  90 tablet    Refill:  1    Order Specific Question:   Supervising Provider    Answer:   Crecencio Mc [[id number]]    Return precautions given.   Risks, benefits, and alternatives of the medications and treatment plan prescribed today were discussed, and patient expressed understanding.   Education regarding symptom management and diagnosis given to patient on AVS.  Continue to follow with Burnard Hawthorne, FNP for routine health maintenance.   [name] and I agreed with plan.   Mable Paris, FNP

## 2020-03-11 NOTE — Assessment & Plan Note (Addendum)
Reassuring neurologic exam. No HA today. Discussed that elevated blood pressure or daily Tylenol use might be exacerbating headache.  Patient  declines any further imaging at this time.  Close follow up.

## 2020-03-18 ENCOUNTER — Other Ambulatory Visit (INDEPENDENT_AMBULATORY_CARE_PROVIDER_SITE_OTHER): Payer: Medicare Other

## 2020-03-18 ENCOUNTER — Other Ambulatory Visit: Payer: Self-pay

## 2020-03-18 DIAGNOSIS — I10 Essential (primary) hypertension: Secondary | ICD-10-CM

## 2020-03-18 LAB — BASIC METABOLIC PANEL
BUN: 19 mg/dL (ref 6–23)
CO2: 29 mEq/L (ref 19–32)
Calcium: 10 mg/dL (ref 8.4–10.5)
Chloride: 99 mEq/L (ref 96–112)
Creatinine, Ser: 0.88 mg/dL (ref 0.40–1.20)
GFR: 61.88 mL/min (ref >60.00)
Glucose, Bld: 107 mg/dL — ABNORMAL HIGH (ref 70–99)
Potassium: 4.7 mEq/L (ref 3.5–5.1)
Sodium: 136 mEq/L (ref 135–145)

## 2020-03-20 ENCOUNTER — Other Ambulatory Visit: Payer: Self-pay | Admitting: Internal Medicine

## 2020-03-25 ENCOUNTER — Other Ambulatory Visit: Payer: Self-pay

## 2020-03-25 ENCOUNTER — Ambulatory Visit: Payer: Medicare Other | Admitting: Family

## 2020-03-25 ENCOUNTER — Encounter: Payer: Self-pay | Admitting: Nurse Practitioner

## 2020-03-25 ENCOUNTER — Ambulatory Visit: Payer: Medicare Other | Admitting: Nurse Practitioner

## 2020-03-25 VITALS — BP 140/64 | HR 75 | Temp 98.2°F | Ht 62.95 in | Wt 139.2 lb

## 2020-03-25 DIAGNOSIS — D649 Anemia, unspecified: Secondary | ICD-10-CM

## 2020-03-25 DIAGNOSIS — I10 Essential (primary) hypertension: Secondary | ICD-10-CM | POA: Diagnosis not present

## 2020-03-25 DIAGNOSIS — I951 Orthostatic hypotension: Secondary | ICD-10-CM

## 2020-03-25 DIAGNOSIS — I6523 Occlusion and stenosis of bilateral carotid arteries: Secondary | ICD-10-CM

## 2020-03-25 DIAGNOSIS — R42 Dizziness and giddiness: Secondary | ICD-10-CM | POA: Diagnosis not present

## 2020-03-25 LAB — CBC WITH DIFFERENTIAL/PLATELET
Basophils Absolute: 0 10*3/uL (ref 0.0–0.1)
Basophils Relative: 0.8 % (ref 0.0–3.0)
Eosinophils Absolute: 0.2 10*3/uL (ref 0.0–0.7)
Eosinophils Relative: 3.5 % (ref 0.0–5.0)
HCT: 36.7 % (ref 36.0–46.0)
Hemoglobin: 12 g/dL (ref 12.0–15.0)
Lymphocytes Relative: 24.9 % (ref 12.0–46.0)
Lymphs Abs: 1.6 10*3/uL (ref 0.7–4.0)
MCHC: 32.5 g/dL (ref 30.0–36.0)
MCV: 87.1 fl (ref 78.0–100.0)
Monocytes Absolute: 0.6 10*3/uL (ref 0.1–1.0)
Monocytes Relative: 8.9 % (ref 3.0–12.0)
Neutro Abs: 3.8 10*3/uL (ref 1.4–7.7)
Neutrophils Relative %: 61.9 % (ref 43.0–77.0)
Platelets: 316 10*3/uL (ref 150.0–400.0)
RBC: 4.22 Mil/uL (ref 3.87–5.11)
RDW: 14.1 % (ref 11.5–15.5)
WBC: 6.2 10*3/uL (ref 4.0–10.5)

## 2020-03-25 LAB — COMPREHENSIVE METABOLIC PANEL
ALT: 15 U/L (ref 0–35)
AST: 19 U/L (ref 0–37)
Albumin: 4.8 g/dL (ref 3.5–5.2)
Alkaline Phosphatase: 83 U/L (ref 39–117)
BUN: 21 mg/dL (ref 6–23)
CO2: 28 mEq/L (ref 19–32)
Calcium: 9.8 mg/dL (ref 8.4–10.5)
Chloride: 102 mEq/L (ref 96–112)
Creatinine, Ser: 0.82 mg/dL (ref 0.40–1.20)
GFR: 67.13 mL/min (ref >60.00)
Glucose, Bld: 100 mg/dL — ABNORMAL HIGH (ref 70–99)
Potassium: 4.3 mEq/L (ref 3.5–5.1)
Sodium: 137 mEq/L (ref 135–145)
Total Bilirubin: 0.5 mg/dL (ref 0.2–1.2)
Total Protein: 7.1 g/dL (ref 6.0–8.3)

## 2020-03-25 NOTE — Progress Notes (Signed)
Established Patient Office Visit  Subjective:  Patient ID: Mary Pacheco, female    DOB: Oct 16, 1941  Age: 79 y.o. MRN: [id number]  CC:  Chief Complaint  Patient presents with  . Follow-up    headaches and dizziness    HPI Mary Pacheco presents for 2-week follow-up of dizziness and intermittent headache with concern over treatment with her hypertensive medication.   She offers no complaints today.  Patient was last seen by Joycelyn Schmid on 03/11/2020.  She had a reduction in her Norvasc dose to 2.5 mg daily and losartan at 25 mg daily.  She brings in her blood pressure readings at home but they are quite variable.  Patient still gets episodes of dizziness when she has been standing on her feet like getting dressed.  This is described as very mild fleeting dizziness.  She reports it does not keep her from doing activities of daily living.  It does not occur at night, or sitting, or when she first gets up in the morning.The blood pressure recall is below.  The headache is better.  She has noted no blurred vision, double vision, numbness or tingling in extremities, unusual weakness, difficulty with speech, or any stroke like symptoms.  She reports her carotid arteries are evaluated annually and remained stable.  She has no chest pain shortness of breath or DOE.  Her right leg ankle still is a little more swollen than the left but ultrasound study performed in 03/07/2020 was negative.  Patient reports that ankle appearance seems to be her baseline.    Hypertension: On losartan 25 mg daily, HCTZ 12.5 mg daily, and amlodipine 2.5 mg daily BP Readings from Last 3 Encounters:  03/25/20 140/64  03/11/20 (!) 144/62  02/29/20 (!) 154/68   She was found to have slight anemia, placed on oral iron therapy, and brings in Hemoccult cards to the lab today.  She has noted black stools when she started on the iron.  She is followed by Dr. Rockey Situ and saw him most recently in December.  EKG showed normal sinus  rhythm with rate of 78 bpm and no significant ST-T wave changes.  She is followed by vein and vascular and a duplex ultrasound carotid arteries revealed 60 to 79% R ICA and 40 to 51% LICA.   Past Medical History:  Diagnosis Date  . Anxiety 2011   patient with anxiety will continue Alprazolam prn  . Arthritis   . GERD (gastroesophageal reflux disease)    pt. with esophagea reflux. She reports great improvement with Dexilant.  [name] Hearing loss    s/p hearing aids  . 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 Hyperlipidemia    patient with HL on statins. lipids well controlled  . Hypertension   . Hypothyroidism   . Pre-diabetes   . Skin cancer     Past Surgical History:  Procedure Laterality Date  . ABDOMINAL HYSTERECTOMY  1993   multiple fibroid cysts  . cataract surgery  02/2011   bilateral  . 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 skin cancer removal    . TONSILLECTOMY      Family History  Problem Relation Age of Onset  . Heart failure Mother   . Heart disease Mother        CABG  . Hypertension Mother   . Hyperlipidemia Mother   . Stroke  Mother        mini strokes, multiple  . Heart attack Father   . Heart failure Father   . Hyperlipidemia Father   . Hypertension Father   . Heart attack Sister 100       MI  . Hernia Sister   . Prostate cancer Brother     Social History   Socioeconomic History  . Marital status: Married    Spouse name: Not on file  . Number of children: Not on file  . Years of education: Not on file  . Highest education level: Not on file  Occupational History  . Not on file  Tobacco Use  . Smoking status: Never Smoker  . Smokeless tobacco: Never Used  Substance and Sexual Activity  . Alcohol use: Yes    Comment: socially  . Drug use: No  . Sexual activity: Yes    Birth control/protection:  Post-menopausal  Other Topics Concern  . Not on file  Social History Narrative   From [employer]      Retired Education officer, museum      married   Social Determinants of Health   Financial Resource Strain: Low Risk   . Difficulty of Paying Living Expenses: Not hard at all  Food Insecurity: No Food Insecurity  . Worried About Charity fundraiser in the Last Year: Never true  . Ran Out of Food in the Last Year: Never true  Transportation Needs: No Transportation Needs  . Lack of Transportation (Medical): No  . Lack of Transportation (Non-Medical): No  Physical Activity: Insufficiently Active  . Days of Exercise per Week: 7 days  . Minutes of Exercise per Session: 20 min  Stress: No Stress Concern Present  . Feeling of Stress : Not at all  Social Connections: Unknown  . Frequency of Communication with Friends and Family: More than three times a week  . Frequency of Social Gatherings with Friends and Family: More than three times a week  . Attends Religious Services: Not on file  . Active Member of Clubs or Organizations: Yes  . Attends Archivist Meetings: Not on file  . Marital Status: Married  Human resources officer Violence: Not At Risk  . Fear of Current or Ex-Partner: No  . Emotionally Abused: No  . Physically Abused: No  . Sexually Abused: No    Outpatient Medications Prior to Visit  Medication Sig Dispense Refill  . acetaminophen (TYLENOL) 500 MG tablet Take 1,000 mg by mouth every 6 (six) hours as needed for moderate pain.    [name] acyclovir (ZOVIRAX) 400 MG tablet TAKE 1 TABLET BY MOUTH EVERY DAY 90 tablet 2  . ALPRAZolam (XANAX) 0.25 MG tablet TAKE 1 TABLET BY MOUTH TWICE A DAY AS NEEDED FOR ANXIETY (Patient taking differently: Take 0.125 mg by mouth daily as needed for anxiety. TAKE 1 TABLET BY MOUTH TWICE A DAY AS NEEDED FOR ANXIETY) 30 tablet 1  . amLODipine (NORVASC) 2.5 MG tablet Take 1 tablet (2.5 mg total) by mouth daily. 90 tablet 3  . Apoaequorin  (PREVAGEN PO) Take 1 tablet by mouth daily.    [name] atorvastatin (LIPITOR) 20 MG tablet TAKE 1 TABLET BY MOUTH EVERY DAY 90 tablet 1  . BAYER ASPIRIN EC LOW DOSE 81 MG EC tablet     . Cholecalciferol (VITAMIN D) 50 MCG (2000 UT) tablet Take 2,000 Units by mouth daily.     . Coenzyme Q10 (EQL COQ10) 200 MG capsule Take 200 mg by mouth daily.     [name]  CRANBERRY PO Take 650 mg by mouth daily.    [name] ezetimibe (ZETIA) 10 MG tablet Take 1 tablet (10 mg total) by mouth daily. 90 tablet 3  . ferrous sulfate 325 (65 FE) MG EC tablet Take 1 tablet (325 mg total) by mouth 2 (two) times daily with a meal. 60 tablet 2  . Flaxseed, Linseed, (FLAX SEED OIL PO) Take 1,400 mg by mouth daily.    Nyoka Cowden Tea, Camellia sinensis, (GREEN TEA PO) Take 500 mg by mouth daily.     . hydrochlorothiazide (MICROZIDE) 12.5 MG capsule TAKE 1 CAPSULE DAILY (MUST ESTABLISH WITH NEW PRIMARY CARE PHYSICIAN) 90 capsule 4  . losartan (COZAAR) 25 MG tablet Take 1 tablet (25 mg total) by mouth daily. 90 tablet 1  . magnesium oxide (MAG-OX) 400 MG tablet Take 400 mg by mouth daily.    . meloxicam (MOBIC) 7.5 MG tablet Take 1 tablet (7.5 mg total) by mouth daily as needed for pain. Take with food 90 tablet 1  . Misc Natural Products (TOTAL CARDIO HEALTH FORMULA PO) Take 1 tablet by mouth daily.      . Omega-3 Fatty Acids (FISH OIL TRIPLE STRENGTH) 1400 MG CAPS Take 1,400 mg by mouth daily.     . pantoprazole (PROTONIX) 40 MG tablet TAKE 1 TABLET DAILY 90 tablet 0  . Probiotic Product (PROBIOTIC DAILY PO) Take 1 capsule by mouth daily.     [name] SYNTHROID 50 MCG tablet TAKE 1 TABLET DAILY 90 tablet 4   No facility-administered medications prior to visit.    Allergies  Allergen Reactions  . Molds & Smuts Other (See Comments)    Induces asthma Induces asthma  . Rosuvastatin Hives  . Simvastatin     myalgia    ROS Pertinent positives and negatives noted in history of present illness.    Objective:    Physical Exam  Constitutional:  She is oriented to person, place, and time. She appears well-developed and well-nourished.  Cardiovascular: Normal rate, regular rhythm and normal heart sounds.  Pulmonary/Chest: Effort normal and breath sounds normal.  Musculoskeletal:     Cervical back: Normal range of motion.     Comments: Right ankle slightly swollen.   Neurological: She is alert and oriented to person, place, and time.  Skin: Skin is warm and dry.  Psychiatric: She has a normal mood and affect. Her behavior is normal.  Vitals reviewed.    BP 140/64 (BP Location: Left Arm, Patient Position: Sitting, Cuff Size: Normal)   Pulse 75   Temp 98.2 F (36.8 C) (Skin)   Ht 5' 2.95" (1.599 m)   Wt 139 lb 3.2 oz (63.1 kg)   SpO2 98%   BMI 24.70 kg/m  Wt Readings from Last 3 Encounters:  03/25/20 139 lb 3.2 oz (63.1 kg)  03/11/20 139 lb 9.6 oz (63.3 kg)  02/29/20 141 lb 9.6 oz (64.2 kg)     There are no preventive care reminders to display for this patient.  There are no preventive care reminders to display for this patient.  Lab Results  Component Value Date   TSH 1.96 12/09/2019   Lab Results  Component Value Date   WBC 6.2 03/25/2020   HGB 12.0 03/25/2020   HCT 36.7 03/25/2020   MCV 87.1 03/25/2020   PLT 316.0 03/25/2020   Lab Results  Component Value Date   NA 137 03/25/2020   K 4.3 03/25/2020   CO2 28 03/25/2020   GLUCOSE 100 (H) 03/25/2020   BUN  21 03/25/2020   CREATININE 0.82 03/25/2020   BILITOT 0.5 03/25/2020   ALKPHOS 83 03/25/2020   AST 19 03/25/2020   ALT 15 03/25/2020   PROT 7.1 03/25/2020   ALBUMIN 4.8 03/25/2020   CALCIUM 9.8 03/25/2020   ANIONGAP 9 08/11/2019   GFR 67.13 03/25/2020   Lab Results  Component Value Date   CHOL 149 12/09/2019   Lab Results  Component Value Date   HDL 75.40 12/09/2019   Lab Results  Component Value Date   LDLCALC 56 12/09/2019   Lab Results  Component Value Date   TRIG 85.0 12/09/2019   Lab Results  Component Value Date   CHOLHDL 2  12/09/2019   Lab Results  Component Value Date   HGBA1C 5.6 02/22/2020      Assessment & Plan:   Problem List Items Addressed This Visit      Cardiovascular and Mediastinum   Essential hypertension    Blood pressure readings from home reviewed and copied in the chart.  Blood pressure ranges systolic from the low 015'A to the 140's. No pattern.  She has not had significant problems with orthostatic hypotension.  Still gets slight dizziness that comes and goes, but headache is improved, right ankle slight  swelling- baseline.       Relevant Orders   Comp Met (CMET) (Completed)   Carotid stenosis   Orthostatic hypotension     Other   Anemia   Relevant Orders   CBC with Differential/Platelet (Completed)   Dizziness - Primary     No orders of the defined types were placed in this encounter. I have ordered laboratory studies today to recheck your hemoglobin and chemistry panel.  Please turn in your stool cards to look for hidden blood in the bowel movement.  You will give [place] a laboratory technician as well.  Continue to monitor blood pressure at home.   Further recommendations pending laboratory studies.   Please keep your appointment Dr. Rockey Situ the end of the month.  Follow-up: Return in about 4 weeks (around 04/22/2020).   This visit occurred during the SARS-CoV-2 public health emergency.  Safety protocols were in place, including screening questions prior to the visit, additional usage of staff PPE, and extensive cleaning of exam room while observing appropriate contact time as indicated for disinfecting solutions.    Denice Paradise, NP

## 2020-03-25 NOTE — Patient Instructions (Addendum)
I have ordered laboratory studies today to recheck your hemoglobin and chemistry panel.  Please turn in your stool cards to look for hidden blood in the bowel movement.  You will give [place] a laboratory technician as well.  Continue to monitor blood pressure at home.  Further recommendations pending laboratory studies.   Please keep your appointment Dr. Rockey Situ the end of the month.

## 2020-03-26 NOTE — Assessment & Plan Note (Addendum)
Blood pressure readings from home reviewed and copied in the chart.  Blood pressure ranges systolic from the low 337'O to the 140's. No pattern.  She has not had significant problems with orthostatic hypotension.  Still gets slight dizziness that comes and goes, but headache is improved, right ankle slight  swelling- baseline.

## 2020-03-28 ENCOUNTER — Other Ambulatory Visit: Payer: Self-pay | Admitting: Physician Assistant

## 2020-03-28 DIAGNOSIS — M2391 Unspecified internal derangement of right knee: Secondary | ICD-10-CM

## 2020-03-29 ENCOUNTER — Other Ambulatory Visit (INDEPENDENT_AMBULATORY_CARE_PROVIDER_SITE_OTHER): Payer: Medicare Other

## 2020-03-29 ENCOUNTER — Telehealth: Payer: Self-pay

## 2020-03-29 DIAGNOSIS — D649 Anemia, unspecified: Secondary | ICD-10-CM

## 2020-03-29 LAB — FECAL OCCULT BLOOD, IMMUNOCHEMICAL: Fecal Occult Bld: POSITIVE — AB

## 2020-03-29 NOTE — Telephone Encounter (Signed)
Critical Lab  IFOB: Positive

## 2020-03-30 NOTE — Telephone Encounter (Signed)
Jessica left message in regards to this yesterday. She will call back patient again per protocol.

## 2020-03-31 ENCOUNTER — Other Ambulatory Visit: Payer: Self-pay | Admitting: Family

## 2020-03-31 DIAGNOSIS — R195 Other fecal abnormalities: Secondary | ICD-10-CM

## 2020-04-04 ENCOUNTER — Telehealth: Payer: Medicare Other

## 2020-04-06 ENCOUNTER — Other Ambulatory Visit: Payer: Self-pay

## 2020-04-06 ENCOUNTER — Ambulatory Visit: Payer: Medicare Other | Admitting: Gastroenterology

## 2020-04-06 ENCOUNTER — Encounter: Payer: Self-pay | Admitting: Gastroenterology

## 2020-04-06 ENCOUNTER — Telehealth: Payer: Self-pay | Admitting: Family

## 2020-04-06 VITALS — BP 170/60 | HR 75 | Temp 97.5°F | Ht 63.5 in | Wt 141.8 lb

## 2020-04-06 DIAGNOSIS — R195 Other fecal abnormalities: Secondary | ICD-10-CM

## 2020-04-06 DIAGNOSIS — I10 Essential (primary) hypertension: Secondary | ICD-10-CM

## 2020-04-06 MED ORDER — AMLODIPINE BESYLATE 2.5 MG PO TABS: 2.5000 mg | ORAL_TABLET | Freq: Every day | ORAL | 3 refills | Status: DC

## 2020-04-06 NOTE — Telephone Encounter (Signed)
Spoke with pt She felt that [place],'r[place]',  only sat a minute prior to being seen in Dr Ronelle Nigh today.   She feels well.  stopped taking losartan 5 days ago, dizziness has since improved and she has resumed losartan. Compliant with amlodipine 2.5mg   Denies exertional chest pain or pressure, numbness or tingling radiating to left arm or jaw, palpitations, frequent headaches, changes in vision, or shortness of breath.  Right ankle swelling is unchanged. Describes as puffy.   At home variable  123/49, 124/64, 115/49, 109/62, 121/49, HR has been in 60-70.   Feels overall blood pressure is well controlled.  We agreed to dc losartan from list for now.  She will use a second dose of amlodpine 2.5mg  for BP > 135/85  BP Readings from Last 3 Encounters:  04/06/20 (!) 170/60  03/25/20 140/64  03/11/20 (!) 144/62

## 2020-04-06 NOTE — Progress Notes (Signed)
Mary Pacheco [address]  [place], [place] [address]  Main: [phone]  Fax: [phone]   Gastroenterology Consultation  Referring Provider:     Burnard Hawthorne, FNP Primary Care Physician:  Burnard Hawthorne, FNP Reason for Consultation:     Positive fecal occult test        HPI:    Chief Complaint  Patient presents with  . New Patient (Initial Visit)  . Positive occults cards    Patient had positive occult cards.    Mary Pacheco is a 79 y.o. y/o female referred for consultation & management  by Dr. Vidal Schwalbe, Yvetta Coder, FNP.  Patient was found to have mild anemia and hemoglobin around 11 which led to a fecal occult test by her primary care provider which was positive.  The patient denies abdominal or flank pain, anorexia, nausea or vomiting, dysphagia, change in bowel habits or black or bloody stools or weight loss.  States was taking iron which caused her stool to be black, when she stopped it this resolved and does not otherwise have melena.  Last colonoscopy 2015 with 3 mm colon polyp removed.  Previous colonoscopies for screening have not found polyps.  Pathology report from 2015 not available   Past Medical History:  Diagnosis Date  . Anxiety 2011   patient with anxiety will continue Alprazolam prn  . Arthritis   . GERD (gastroesophageal reflux disease)    pt. with esophagea reflux. She reports great improvement with Dexilant.  [name] Hearing loss    s/p hearing aids  . 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 Hyperlipidemia    patient with HL on statins. lipids well controlled  . Hypertension   . Hypothyroidism   . Pre-diabetes   . Skin cancer     Past Surgical History:  Procedure Laterality Date  . ABDOMINAL HYSTERECTOMY  1993   multiple fibroid cysts  . cataract surgery  02/2011   bilateral  . KNEE ARTHROPLASTY Left 08/19/2019   Procedure: COMPUTER ASSISTED TOTAL KNEE ARTHROPLASTY;  Surgeon: Dereck Leep, MD;  Location: [facility] ORS;   Service: Orthopedics;  Laterality: Left;  . skin cancer removal    . TONSILLECTOMY      Prior to Admission medications   Medication Sig Start Date End Date Taking? Authorizing Provider  acetaminophen (TYLENOL) 500 MG tablet Take 1,000 mg by mouth every 6 (six) hours as needed for moderate pain.   Yes [provider]  acyclovir (ZOVIRAX) 400 MG tablet TAKE 1 TABLET BY MOUTH EVERY DAY 11/27/19  Yes Arnett, Yvetta Coder, FNP  ALPRAZolam (XANAX) 0.25 MG tablet TAKE 1 TABLET BY MOUTH TWICE A DAY AS NEEDED FOR ANXIETY Patient taking differently: Take 0.125 mg by mouth daily as needed for anxiety. TAKE 1 TABLET BY MOUTH TWICE A DAY AS NEEDED FOR ANXIETY 06/17/19  Yes Arnett, Yvetta Coder, FNP  amLODipine (NORVASC) 2.5 MG tablet Take 1 tablet (2.5 mg total) by mouth daily. 03/11/20  Yes Arnett, Yvetta Coder, FNP  Apoaequorin (PREVAGEN PO) Take 1 tablet by mouth daily.   Yes [provider]  atorvastatin (LIPITOR) 20 MG tablet TAKE 1 TABLET BY MOUTH EVERY DAY 03/22/20  Yes Crecencio Mc, MD  BAYER ASPIRIN EC LOW DOSE 81 MG EC tablet  02/26/20  Yes [provider]  Cholecalciferol (VITAMIN D) 50 MCG (2000 UT) tablet Take 2,000 Units by mouth daily.    Yes [provider]  Coenzyme Q10 (EQL COQ10) 200 MG  capsule Take 200 mg by mouth daily.    Yes [provider]  CRANBERRY PO Take 650 mg by mouth daily.   Yes [provider]  ezetimibe (ZETIA) 10 MG tablet Take 1 tablet (10 mg total) by mouth daily. 10/12/19  Yes Gollan, Kathlene November, MD  Flaxseed, Linseed, (FLAX SEED OIL PO) Take 1,400 mg by mouth daily.   Yes [provider]  Nyoka Cowden Tea, Camellia sinensis, (GREEN TEA PO) Take 500 mg by mouth daily.    Yes [provider]  hydrochlorothiazide (MICROZIDE) 12.5 MG capsule TAKE 1 CAPSULE DAILY (MUST ESTABLISH WITH NEW PRIMARY CARE PHYSICIAN) 10/29/19  Yes Arnett, Yvetta Coder, FNP  losartan (COZAAR) 25 MG tablet Take 1 tablet (25 mg total) by mouth daily.  03/11/20  Yes Arnett, Yvetta Coder, FNP  magnesium oxide (MAG-OX) 400 MG tablet Take 400 mg by mouth daily.   Yes [provider]  meloxicam (MOBIC) 7.5 MG tablet Take 1 tablet (7.5 mg total) by mouth daily as needed for pain. Take with food 11/30/19  Yes Arnett, Yvetta Coder, FNP  Misc Natural Products (TOTAL CARDIO HEALTH FORMULA PO) Take 1 tablet by mouth daily.     Yes [provider]  Omega-3 Fatty Acids (FISH OIL TRIPLE STRENGTH) 1400 MG CAPS Take 1,400 mg by mouth daily.    Yes [provider]  pantoprazole (PROTONIX) 40 MG tablet TAKE 1 TABLET DAILY 01/30/20  Yes Arnett, Yvetta Coder, FNP  Probiotic Product (PROBIOTIC DAILY PO) Take 1 capsule by mouth daily.    Yes [provider]  SYNTHROID 50 MCG tablet TAKE 1 TABLET DAILY 08/17/19  Yes Burnard Hawthorne, FNP  ferrous sulfate 325 (65 FE) MG EC tablet Take 1 tablet (325 mg total) by mouth 2 (two) times daily with a meal. Patient not taking: Reported on 04/06/2020 03/11/20   Burnard Hawthorne, FNP    Family History  Problem Relation Age of Onset  . Heart failure Mother   . Heart disease Mother        CABG  . Hypertension Mother   . Hyperlipidemia Mother   . Stroke Mother        mini strokes, multiple  . Heart attack Father   . Heart failure Father   . Hyperlipidemia Father   . Hypertension Father   . Heart attack Sister 61       MI  . Hernia Sister   . Prostate cancer Brother      Social History   Tobacco Use  . Smoking status: Never Smoker  . Smokeless tobacco: Never Used  Vaping Use  . Vaping Use: Never used  Substance Use Topics  . Alcohol use: Yes    Comment: socially  . Drug use: No    Allergies as of 04/06/2020 - Review Complete 04/06/2020  Allergen Reaction Noted  . Molds & smuts Other (See Comments) 07/12/2011  . Rosuvastatin Hives 02/21/2016  . Simvastatin  10/06/2014    Review of Systems:    All systems reviewed and negative except where noted in HPI.   Physical Exam:    BP (!) 199/63   Pulse 75   Temp (!) 97.5 F (36.4 C) (Oral)   Ht 5' 3.5" (1.613 m)   Wt 141 lb 12.8 oz (64.3 kg)   BMI 24.72 kg/m  No LMP recorded. Patient has had a hysterectomy. Psych:  Alert and cooperative. Normal mood and affect. General:   Alert,  Well-developed, well-nourished, pleasant and cooperative in NAD Head:  Normocephalic and atraumatic. Eyes:  Sclera clear, no icterus.   Conjunctiva pink. Ears:  Normal auditory acuity. Nose:  No deformity, discharge, or lesions. Mouth:  No deformity or lesions,oropharynx pink & moist. Neck:  Supple; no masses or thyromegaly. Abdomen:  Normal bowel sounds.  No bruits.  Soft, non-tender and non-distended without masses, hepatosplenomegaly or hernias noted.  No guarding or rebound tenderness.    Msk:  Symmetrical without gross deformities. Good, equal movement & strength bilaterally. Pulses:  Normal pulses noted. Extremities:  No clubbing or edema.  No cyanosis. Neurologic:  Alert and oriented x3;  grossly normal neurologically. Skin:  Intact without significant lesions or rashes. No jaundice. Lymph Nodes:  No significant cervical adenopathy. Psych:  Alert and cooperative. Normal mood and affect.   Labs: CBC    Component Value Date/Time   WBC 6.2 03/25/2020 1341   RBC 4.22 03/25/2020 1341   HGB 12.0 03/25/2020 1341   HGB 14.0 12/28/2011 1819   HCT 36.7 03/25/2020 1341   HCT 41.9 12/28/2011 1819   PLT 316.0 03/25/2020 1341   PLT 261 12/28/2011 1819   MCV 87.1 03/25/2020 1341   MCV 89 12/28/2011 1819   MCH 30.2 08/11/2019 1142   MCHC 32.5 03/25/2020 1341   RDW 14.1 03/25/2020 1341   RDW 13.3 12/28/2011 1819   LYMPHSABS 1.6 03/25/2020 1341   MONOABS 0.6 03/25/2020 1341   EOSABS 0.2 03/25/2020 1341   BASOSABS 0.0 03/25/2020 1341   CMP     Component Value Date/Time   NA 137 03/25/2020 1341   NA 140 12/28/2011 1819   K 4.3 03/25/2020 1341   K 3.2 (L) 12/28/2011 1819   CL 102 03/25/2020 1341   CL 104 12/28/2011 1819    CO2 28 03/25/2020 1341   CO2 27 12/28/2011 1819   GLUCOSE 100 (H) 03/25/2020 1341   GLUCOSE 98 12/28/2011 1819   BUN 21 03/25/2020 1341   BUN 13 12/28/2011 1819   CREATININE 0.82 03/25/2020 1341   CREATININE 1.00 11/06/2013 0854   CALCIUM 9.8 03/25/2020 1341   CALCIUM 9.1 12/28/2011 1819   PROT 7.1 03/25/2020 1341   PROT 7.9 12/28/2011 1819   ALBUMIN 4.8 03/25/2020 1341   ALBUMIN 4.5 12/28/2011 1819   AST 19 03/25/2020 1341   AST 53 (H) 12/28/2011 1819   ALT 15 03/25/2020 1341   ALT 80 (H) 12/28/2011 1819   ALKPHOS 83 03/25/2020 1341   ALKPHOS 84 12/28/2011 1819   BILITOT 0.5 03/25/2020 1341   BILITOT 0.5 12/28/2011 1819   GFRNONAA >60 08/11/2019 1142   GFRNONAA 56 (L) 11/06/2013 0854   GFRAA >60 08/11/2019 1142   GFRAA >60 11/06/2013 0854    Imaging Studies: No results found.  Assessment and Plan:   AARINI SLEE is a 79 y.o. y/o female has been referred for positive fit test  We discussed the significance of a positive fit test including possibility of false positives and the test being assigned for CRC screening  Risk of malignancy is low given previous colonoscopies with one small polyp in 2015, no polyps before that.  We discussed options of proceeding with colonoscopy to rule out any underlying lesions and colon cancer versus conservative management at her age  After discussing risks and benefits in detail, patient would like to proceed with colonoscopy at this time  Patient's blood pressure initially when she presented to [place] was 751 systolic.  However, she attributes this to having to drive here, and coming up the stairs.  She  denies any chest pain, dizziness, vision changes, headaches.  We repeated this and systolic was in the 793J and patient was asymptomatic at the time of discharge.  I have asked her to follow-up with primary care provider for hypertension and she states she was recently started on new antihypertensive meds.  Dr Mary Pacheco  Speech  recognition software was used to dictate the above note.

## 2020-04-15 ENCOUNTER — Ambulatory Visit
Admission: RE | Admit: 2020-04-15 | Discharge: 2020-04-15 | Disposition: A | Discharge status: Home / Self Care | Payer: Medicare Other | Source: Ambulatory Visit | Attending: Physician Assistant | Admitting: Physician Assistant

## 2020-04-15 ENCOUNTER — Other Ambulatory Visit: Payer: Self-pay

## 2020-04-15 ENCOUNTER — Other Ambulatory Visit
Admission: RE | Admit: 2020-04-15 | Discharge: 2020-04-15 | Disposition: A | Discharge status: Home / Self Care | Payer: Medicare Other | Source: Ambulatory Visit | Attending: Gastroenterology | Admitting: Gastroenterology

## 2020-04-15 DIAGNOSIS — M25461 Effusion, right knee: Secondary | ICD-10-CM | POA: Diagnosis not present

## 2020-04-15 DIAGNOSIS — M659 Synovitis and tenosynovitis, unspecified: Secondary | ICD-10-CM | POA: Diagnosis not present

## 2020-04-15 DIAGNOSIS — M23351 Other meniscus derangements, posterior horn of lateral meniscus, right knee: Secondary | ICD-10-CM | POA: Diagnosis not present

## 2020-04-15 DIAGNOSIS — Z20822 Contact with and (suspected) exposure to covid-19: Secondary | ICD-10-CM | POA: Insufficient documentation

## 2020-04-15 DIAGNOSIS — M1711 Unilateral primary osteoarthritis, right knee: Secondary | ICD-10-CM | POA: Insufficient documentation

## 2020-04-15 DIAGNOSIS — M25561 Pain in right knee: Secondary | ICD-10-CM | POA: Diagnosis present

## 2020-04-15 DIAGNOSIS — M23341 Other meniscus derangements, anterior horn of lateral meniscus, right knee: Secondary | ICD-10-CM | POA: Insufficient documentation

## 2020-04-15 DIAGNOSIS — M2391 Unspecified internal derangement of right knee: Secondary | ICD-10-CM

## 2020-04-16 LAB — SARS CORONAVIRUS 2 (TAT 6-24 HRS): SARS Coronavirus 2: NEGATIVE

## 2020-04-16 NOTE — Progress Notes (Signed)
Date:  04/18/2020   ID:  Mary Pacheco, DOB [birth year], MRN [id number]  Patient Location:  [place] [address]   Provider location:   [facility], [place] office  PCP:  Mary Hawthorne, FNP  Cardiologist:  No primary care provider on file.   Chief Complaint  Patient presents with  . Other    6 month follow up. meds reviewed verbally with patient.     History of Present Illness:    Mary Pacheco is a 79 y.o. female PMH of  hypertension,  hyperlipidemia,  bilateral carotid arterial disease,  Ultrasound March 2021 40 to 59% stenosis on the right Less than 39% disease on the left anxiety  presenting for routine followup of her PAD/carotid disease, hyperlipidemia  Ultrasound March 2021 40 to 59% stenosis on the right Less than 39% disease on the left  Tolerating Lipitor Zetia LDL at goal, 59  Colonoscopy tomorrow Continues to have labile pressure but much better control at home Worries it might be getting too low at times  Has a rash. Mild on hands /wrists Unclear if this is the Zetia or something else, to improve with cortisone  New knee , knee replacement Thinks he may need to have the other one done  EKG personally reviewed by myself on todays visit NSR rate 63 bpm no ST or T wave changes  lipitor 40 gave side effects, went back to 20 daily Arm ache on the 40 daily  Numbers discussed with her Lab Results  Component Value Date   CHOL 149 12/09/2019   HDL 75.40 12/09/2019   LDLCALC 56 12/09/2019   TRIG 85.0 12/09/2019      Past Medical History:  Diagnosis Date  . Anxiety 2011   patient with anxiety will continue Alprazolam prn  . Arthritis   . GERD (gastroesophageal reflux disease)    pt. with esophagea reflux. She reports great improvement with Dexilant.  Mary Pacheco Hearing loss    s/p hearing aids  . 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 Hyperlipidemia    patient with HL on statins. lipids well controlled  .  Hypertension   . Hypothyroidism   . Pre-diabetes   . Skin cancer    Past Surgical History:  Procedure Laterality Date  . ABDOMINAL HYSTERECTOMY  1993   multiple fibroid cysts  . cataract surgery  02/2011   bilateral  . KNEE ARTHROPLASTY Left 08/19/2019   Procedure: COMPUTER ASSISTED TOTAL KNEE ARTHROPLASTY;  Surgeon: Mary Leep, MD;  Location: [facility] ORS;  Service: Orthopedics;  Laterality: Left;  . skin cancer removal    . TONSILLECTOMY       Current Meds  Medication Sig  . acetaminophen (TYLENOL) 500 MG tablet Take 1,000 mg by mouth every 6 (six) hours as needed for moderate pain.  Mary Pacheco acyclovir (ZOVIRAX) 400 MG tablet TAKE 1 TABLET BY MOUTH EVERY DAY  . ALPRAZolam (XANAX) 0.25 MG tablet TAKE 1 TABLET BY MOUTH TWICE A DAY AS NEEDED FOR ANXIETY (Patient taking differently: Take 0.125 mg by mouth daily as needed for anxiety. TAKE 1 TABLET BY MOUTH TWICE A DAY AS NEEDED FOR ANXIETY)  . amLODipine (NORVASC) 2.5 MG tablet Take 1 tablet (2.5 mg total) by mouth daily. May take additional dose for blood pressure > 135/85.  Mary Pacheco Apoaequorin (PREVAGEN PO) Take 1 tablet by mouth daily.  Mary Pacheco atorvastatin (LIPITOR) 20 MG tablet TAKE 1 TABLET BY MOUTH EVERY DAY  . BAYER ASPIRIN  EC LOW DOSE 81 MG EC tablet   . Cholecalciferol (VITAMIN D) 50 MCG (2000 UT) tablet Take 2,000 Units by mouth daily.   . Coenzyme Q10 (EQL COQ10) 200 MG capsule Take 200 mg by mouth daily.   Mary Pacheco CRANBERRY PO Take 650 mg by mouth daily.  Mary Pacheco ezetimibe (ZETIA) 10 MG tablet Take 1 tablet (10 mg total) by mouth daily.  . Flaxseed, Linseed, (FLAX SEED OIL PO) Take 1,400 mg by mouth daily.  Nyoka Cowden Tea, Camellia sinensis, (GREEN TEA PO) Take 500 mg by mouth daily.   . hydrochlorothiazide (MICROZIDE) 12.5 MG capsule TAKE 1 CAPSULE DAILY (MUST ESTABLISH WITH NEW PRIMARY CARE PHYSICIAN)  . magnesium oxide (MAG-OX) 400 MG tablet Take 400 mg by mouth daily.  . meloxicam (MOBIC) 7.5 MG tablet Take 1 tablet (7.5 mg total) by mouth daily as  needed for pain. Take with food  . Misc Natural Products (TOTAL CARDIO HEALTH FORMULA PO) Take 1 tablet by mouth daily.    . Omega-3 Fatty Acids (FISH OIL TRIPLE STRENGTH) 1400 MG CAPS Take 1,400 mg by mouth daily.   . pantoprazole (PROTONIX) 40 MG tablet TAKE 1 TABLET DAILY  . Probiotic Product (PROBIOTIC DAILY PO) Take 1 capsule by mouth daily.   Mary Pacheco SYNTHROID 50 MCG tablet TAKE 1 TABLET DAILY     Allergies:   Molds & smuts, Rosuvastatin, and Simvastatin   Social History   Tobacco Use  . Smoking status: Never Smoker  . Smokeless tobacco: Never Used  Vaping Use  . Vaping Use: Never used  Substance Use Topics  . Alcohol use: Yes    Comment: socially  . Drug use: No     Family Hx: The patient's family history includes Heart attack in her father; Heart attack (age of onset: 37) in her sister; Heart disease in her mother; Heart failure in her father and mother; Mary Pacheco in her sister; Hyperlipidemia in her father and mother; Hypertension in her father and mother; Prostate cancer in her brother; Stroke in her mother.  ROS:   Please see the history of present illness.    Review of Systems  Constitutional: Negative.   HENT: Negative.   Respiratory: Negative.   Cardiovascular: Negative.   Gastrointestinal: Negative.   Musculoskeletal: Positive for joint pain.  Neurological: Negative.   Psychiatric/Behavioral: Negative.   All other systems reviewed and are negative.    Labs/Other Tests and Data Reviewed:    Recent Labs: 12/09/2019: TSH 1.96 03/25/2020: ALT 15; BUN 21; Creatinine, Ser 0.82; Hemoglobin 12.0; Platelets 316.0; Potassium 4.3; Sodium 137   Recent Lipid Panel Lab Results  Component Value Date/Time   CHOL 149 12/09/2019 09:41 AM   TRIG 85.0 12/09/2019 09:41 AM   HDL 75.40 12/09/2019 09:41 AM   CHOLHDL 2 12/09/2019 09:41 AM   LDLCALC 56 12/09/2019 09:41 AM    Wt Readings from Last 3 Encounters:  04/18/20 140 lb (63.5 kg)  04/06/20 141 lb 12.8 oz (64.3 kg)  03/25/20  139 lb 3.2 oz (63.1 kg)     Exam:   BP 134/62 (BP Location: Left Arm, Patient Position: Sitting, Cuff Size: Normal)   Pulse 63   Ht 5' 3.5" (1.613 m)   Wt 140 lb (63.5 kg)   SpO2 99%   BMI 24.41 kg/m  Constitutional:  oriented to person, place, and time. No distress.  HENT:  Head: Grossly normal Eyes:  no discharge. No scleral icterus.  Neck: No JVD, no carotid bruits  Cardiovascular: Regular rate and rhythm,  no murmurs appreciated Pulmonary/Chest: Clear to auscultation bilaterally, no wheezes or rails Abdominal: Soft.  no distension.  no tenderness.  Musculoskeletal: Normal range of motion Neurological:  normal muscle tone. Coordination normal. No atrophy Skin: Skin warm and dry Psychiatric: normal affect, pleasant    ASSESSMENT & PLAN:    PAD (peripheral artery disease) (HCC) Discussed prior carotid ultrasound with her Stressed the need for more aggressive lipid management  Zetia , Lipitor LDL at goal  Bilateral carotid artery stenosis Ultrasound March 2021 40 to 59% stenosis on the right Less than 39% disease on the left  Mixed hyperlipidemia Lipitor with Zetia, Cholesterol is at goal on the current lipid regimen. No changes to the medications were made.  Essential hypertension Blood pressure is well controlled on today's visit. No changes made to the medications.  Prediabetes Drinks protein shakes Trying to watch her diet    Total encounter time more than 25 minutes  Greater than 50% was spent in counseling and coordination of care with the patient   Disposition: Follow-up in 12 months   Signed, Ida Rogue, MD  04/18/2020 1:47 PM    [place] Office [address] [place] [address], [place], [place] [address]

## 2020-04-18 ENCOUNTER — Encounter: Payer: Self-pay | Admitting: Cardiovascular Disease

## 2020-04-18 ENCOUNTER — Other Ambulatory Visit: Payer: Self-pay

## 2020-04-18 ENCOUNTER — Ambulatory Visit: Payer: Medicare Other | Admitting: Cardiovascular Disease

## 2020-04-18 VITALS — BP 134/62 | HR 63 | Ht 63.5 in | Wt 140.0 lb

## 2020-04-18 DIAGNOSIS — I6523 Occlusion and stenosis of bilateral carotid arteries: Secondary | ICD-10-CM

## 2020-04-18 DIAGNOSIS — E782 Mixed hyperlipidemia: Secondary | ICD-10-CM | POA: Diagnosis not present

## 2020-04-18 DIAGNOSIS — R7303 Prediabetes: Secondary | ICD-10-CM | POA: Diagnosis not present

## 2020-04-18 DIAGNOSIS — I10 Essential (primary) hypertension: Secondary | ICD-10-CM

## 2020-04-18 DIAGNOSIS — I739 Peripheral vascular disease, unspecified: Secondary | ICD-10-CM

## 2020-04-18 NOTE — Patient Instructions (Addendum)

## 2020-04-19 ENCOUNTER — Encounter: Payer: Self-pay | Admitting: Gastroenterology

## 2020-04-19 ENCOUNTER — Ambulatory Visit
Admission: RE | Admit: 2020-04-19 | Discharge: 2020-04-19 | Disposition: A | Discharge status: Home / Self Care | Payer: Medicare Other | Attending: Gastroenterology | Admitting: Gastroenterology

## 2020-04-19 ENCOUNTER — Ambulatory Visit: Payer: Medicare Other | Admitting: Anesthesiology

## 2020-04-19 ENCOUNTER — Encounter
Admission: RE | Disposition: A | Discharge status: Home / Self Care | Payer: Self-pay | Source: Home / Self Care | Attending: Gastroenterology

## 2020-04-19 DIAGNOSIS — Z9109 Other allergy status, other than to drugs and biological substances: Secondary | ICD-10-CM | POA: Diagnosis not present

## 2020-04-19 DIAGNOSIS — Z8249 Family history of ischemic heart disease and other diseases of the circulatory system: Secondary | ICD-10-CM | POA: Diagnosis not present

## 2020-04-19 DIAGNOSIS — Z85828 Personal history of other malignant neoplasm of skin: Secondary | ICD-10-CM | POA: Diagnosis not present

## 2020-04-19 DIAGNOSIS — F419 Anxiety disorder, unspecified: Secondary | ICD-10-CM | POA: Diagnosis not present

## 2020-04-19 DIAGNOSIS — R195 Other fecal abnormalities: Secondary | ICD-10-CM

## 2020-04-19 DIAGNOSIS — R7303 Prediabetes: Secondary | ICD-10-CM | POA: Insufficient documentation

## 2020-04-19 DIAGNOSIS — M1990 Unspecified osteoarthritis, unspecified site: Secondary | ICD-10-CM | POA: Insufficient documentation

## 2020-04-19 DIAGNOSIS — E039 Hypothyroidism, unspecified: Secondary | ICD-10-CM | POA: Diagnosis not present

## 2020-04-19 DIAGNOSIS — I10 Essential (primary) hypertension: Secondary | ICD-10-CM | POA: Insufficient documentation

## 2020-04-19 DIAGNOSIS — Z823 Family history of stroke: Secondary | ICD-10-CM | POA: Diagnosis not present

## 2020-04-19 DIAGNOSIS — K219 Gastro-esophageal reflux disease without esophagitis: Secondary | ICD-10-CM | POA: Diagnosis not present

## 2020-04-19 DIAGNOSIS — Z8042 Family history of malignant neoplasm of prostate: Secondary | ICD-10-CM | POA: Insufficient documentation

## 2020-04-19 DIAGNOSIS — Z888 Allergy status to other drugs, medicaments and biological substances status: Secondary | ICD-10-CM | POA: Insufficient documentation

## 2020-04-19 DIAGNOSIS — Z79899 Other long term (current) drug therapy: Secondary | ICD-10-CM | POA: Diagnosis not present

## 2020-04-19 DIAGNOSIS — Z8349 Family history of other endocrine, nutritional and metabolic diseases: Secondary | ICD-10-CM | POA: Insufficient documentation

## 2020-04-19 DIAGNOSIS — Z96652 Presence of left artificial knee joint: Secondary | ICD-10-CM | POA: Insufficient documentation

## 2020-04-19 DIAGNOSIS — E785 Hyperlipidemia, unspecified: Secondary | ICD-10-CM | POA: Insufficient documentation

## 2020-04-19 DIAGNOSIS — Z90710 Acquired absence of both cervix and uterus: Secondary | ICD-10-CM | POA: Diagnosis not present

## 2020-04-19 HISTORY — PX: COLONOSCOPY WITH PROPOFOL: SHX5780

## 2020-04-19 SURGERY — COLONOSCOPY WITH PROPOFOL
Anesthesia: General

## 2020-04-19 MED ORDER — SODIUM CHLORIDE 0.9 % IV SOLN
INTRAVENOUS | Status: DC
  Administered 2020-04-19: 1000 mL via INTRAVENOUS

## 2020-04-19 MED ORDER — PROPOFOL 500 MG/50ML IV EMUL
INTRAVENOUS | Status: DC | PRN
  Administered 2020-04-19: 200 ug/kg/min via INTRAVENOUS

## 2020-04-19 MED ORDER — GLYCOPYRROLATE 0.2 MG/ML IJ SOLN
INTRAMUSCULAR | Status: AC
  Filled 2020-04-19: qty 1

## 2020-04-19 MED ORDER — PROPOFOL 10 MG/ML IV BOLUS
INTRAVENOUS | Status: DC | PRN
  Administered 2020-04-19: 30 mg via INTRAVENOUS
  Administered 2020-04-19: 70 mg via INTRAVENOUS

## 2020-04-19 MED ORDER — LIDOCAINE 2% (20 MG/ML) 5 ML SYRINGE
INTRAMUSCULAR | Status: DC | PRN
  Administered 2020-04-19: 50 mg via INTRAVENOUS

## 2020-04-19 MED ORDER — LIDOCAINE HCL (PF) 2 % IJ SOLN
INTRAMUSCULAR | Status: AC
  Filled 2020-04-19: qty 5

## 2020-04-19 MED ORDER — PROPOFOL 500 MG/50ML IV EMUL
INTRAVENOUS | Status: AC
  Filled 2020-04-19: qty 50

## 2020-04-19 NOTE — H&P (Signed)
Vonda Antigua, MD [address] [place], [place], [place], [address] [address], [place], [place], [place], [address] Phone: [phone]  Fax: [phone]  Primary Care Physician:  Burnard Hawthorne, FNP   Pre-Procedure History & Physical: HPI:  Mary Pacheco is a 79 y.o. female is here for a colonoscopy.   Past Medical History:  Diagnosis Date  . Anxiety 2011   patient with anxiety will continue Alprazolam prn  . Arthritis   . GERD (gastroesophageal reflux disease)    pt. with esophagea reflux. She reports great improvement with Dexilant.  [name] Hearing loss    s/p hearing aids  . 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 Hyperlipidemia    patient with HL on statins. lipids well controlled  . Hypertension   . Hypothyroidism   . Pre-diabetes   . Skin cancer     Past Surgical History:  Procedure Laterality Date  . ABDOMINAL HYSTERECTOMY  1993   multiple fibroid cysts  . cataract surgery  02/2011   bilateral  . EYE SURGERY    . 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 skin cancer removal    . TONSILLECTOMY      Prior to Admission medications   Medication Sig Start Date End Date Taking? Authorizing Provider  acetaminophen (TYLENOL) 500 MG tablet Take 1,000 mg by mouth every 6 (six) hours as needed for moderate pain.   Yes [provider]  acyclovir (ZOVIRAX) 400 MG tablet TAKE 1 TABLET BY MOUTH EVERY DAY 11/27/19  Yes Arnett, Yvetta Coder, FNP  ALPRAZolam (XANAX) 0.25 MG tablet TAKE 1 TABLET BY MOUTH TWICE A DAY AS NEEDED FOR ANXIETY Patient taking differently: Take 0.125 mg by mouth daily as needed for anxiety. TAKE 1 TABLET BY MOUTH TWICE A DAY AS NEEDED FOR ANXIETY 06/17/19  Yes Arnett, Yvetta Coder, FNP  amLODipine (NORVASC) 2.5 MG tablet Take 1 tablet (2.5 mg total) by mouth daily. May take additional dose for blood  pressure > 135/85. 04/06/20  Yes Arnett, Yvetta Coder, FNP  Apoaequorin (PREVAGEN PO) Take 1 tablet by mouth daily.   Yes [provider]  atorvastatin (LIPITOR) 20 MG tablet TAKE 1 TABLET BY MOUTH EVERY DAY 03/22/20  Yes Crecencio Mc, MD  BAYER ASPIRIN EC LOW DOSE 81 MG EC tablet  02/26/20  Yes [provider]  Cholecalciferol (VITAMIN D) 50 MCG (2000 UT) tablet Take 2,000 Units by mouth daily.    Yes [provider]  Coenzyme Q10 (EQL COQ10) 200 MG capsule Take 200 mg by mouth daily.    Yes [provider]  CRANBERRY PO Take 650 mg by mouth daily.   Yes [provider]  ezetimibe (ZETIA) 10 MG tablet Take 1 tablet (10 mg total) by mouth daily. 10/12/19  Yes Gollan, Kathlene November, MD  Flaxseed, Linseed, (FLAX SEED OIL PO) Take 1,400 mg by mouth daily.   Yes [provider]  Nyoka Cowden Tea, Camellia sinensis, (GREEN TEA PO) Take 500 mg by mouth daily.    Yes [provider]  hydrochlorothiazide (MICROZIDE) 12.5 MG capsule TAKE 1 CAPSULE DAILY (MUST ESTABLISH WITH NEW PRIMARY CARE PHYSICIAN) 10/29/19  Yes Arnett, Yvetta Coder, FNP  magnesium oxide (MAG-OX) 400 MG tablet Take 400 mg by mouth daily.   Yes [provider]  meloxicam (MOBIC) 7.5 MG tablet Take 1 tablet (7.5 mg total) by  mouth daily as needed for pain. Take with food 11/30/19  Yes Arnett, Yvetta Coder, FNP  Misc Natural Products (TOTAL CARDIO HEALTH FORMULA PO) Take 1 tablet by mouth daily.     Yes [provider]  Omega-3 Fatty Acids (FISH OIL TRIPLE STRENGTH) 1400 MG CAPS Take 1,400 mg by mouth daily.    Yes [provider]  pantoprazole (PROTONIX) 40 MG tablet TAKE 1 TABLET DAILY 01/30/20  Yes Arnett, Yvetta Coder, FNP  Probiotic Product (PROBIOTIC DAILY PO) Take 1 capsule by mouth daily.    Yes [provider]  SYNTHROID 50 MCG tablet TAKE 1 TABLET DAILY 08/17/19  Yes Burnard Hawthorne, FNP    Allergies as of 04/06/2020 - Review Complete 04/06/2020    Allergen Reaction Noted  . Molds & smuts Other (See Comments) 07/12/2011  . Rosuvastatin Hives 02/21/2016  . Simvastatin  10/06/2014    Family History  Problem Relation Age of Onset  . Heart failure Mother   . Heart disease Mother        CABG  . Hypertension Mother   . Hyperlipidemia Mother   . Stroke Mother        mini strokes, multiple  . Heart attack Father   . Heart failure Father   . Hyperlipidemia Father   . Hypertension Father   . Heart attack Sister 56       MI  . Hernia Sister   . Prostate cancer Brother     Social History   Socioeconomic History  . Marital status: Married    Spouse name: Not on file  . Number of children: Not on file  . Years of education: Not on file  . Highest education level: Not on file  Occupational History  . Not on file  Tobacco Use  . Smoking status: Never Smoker  . Smokeless tobacco: Never Used  Vaping Use  . Vaping Use: Never used  Substance and Sexual Activity  . Alcohol use: Yes    Comment: socially  . Drug use: No  . Sexual activity: Yes    Birth control/protection: Post-menopausal  Other Topics Concern  . Not on file  Social History Narrative   From [employer]      Retired Education officer, museum      married   Social Determinants of Health   Financial Resource Strain: Low Risk   . Difficulty of Paying Living Expenses: Not hard at all  Food Insecurity: No Food Insecurity  . Worried About Charity fundraiser in the Last Year: Never true  . Ran Out of Food in the Last Year: Never true  Transportation Needs: No Transportation Needs  . Lack of Transportation (Medical): No  . Lack of Transportation (Non-Medical): No  Physical Activity: Insufficiently Active  . Days of Exercise per Week: 7 days  . Minutes of Exercise per Session: 20 min  Stress: No Stress Concern Present  . Feeling of Stress : Not at all  Social Connections: Unknown  . Frequency of Communication with Friends and Family: More than three times  a week  . Frequency of Social Gatherings with Friends and Family: More than three times a week  . Attends Religious Services: Not on file  . Active Member of Clubs or Organizations: Yes  . Attends Archivist Meetings: Not on file  . Marital Status: Married  Human resources officer Violence: Not At Risk  . Fear of Current or Ex-Partner: No  . Emotionally Abused: No  . Physically Abused: No  .  Sexually Abused: No    Review of Systems: See HPI, otherwise negative ROS  Physical Exam: BP (!) 174/66   Pulse 66   Temp (!) 97.5 F (36.4 C) (Temporal)   Resp 16   Ht 5' 3.5" (1.613 m)   Wt 63.6 kg   SpO2 100%   BMI 24.46 kg/m  General:   Alert,  pleasant and cooperative in NAD Head:  Normocephalic and atraumatic. Neck:  Supple; no masses or thyromegaly. Lungs:  Clear throughout to auscultation, normal respiratory effort.    Heart:  +S1, +S2, Regular rate and rhythm, No edema. Abdomen:  Soft, nontender and nondistended. Normal bowel sounds, without guarding, and without rebound.   Neurologic:  Alert and  oriented x4;  grossly normal neurologically.  Impression/Plan: Mary Pacheco is here for a colonoscopy to be performed for positive FIT test.  Risks, benefits, limitations, and alternatives regarding  colonoscopy have been reviewed with the patient.  Questions have been answered.  All parties agreeable.   Virgel Manifold, MD  04/19/2020, 10:12 AM

## 2020-04-19 NOTE — Anesthesia Preprocedure Evaluation (Signed)
Anesthesia Evaluation  Patient identified by MRN, date of birth, ID band Patient awake    Reviewed: Allergy & Precautions, H&P , NPO status , Patient's Chart, lab work & pertinent test results, reviewed documented beta blocker date and time   History of Anesthesia Complications Negative for: history of anesthetic complications  Airway Mallampati: I  TM Distance: >3 FB Neck ROM: full    Dental  (+) Implants, Dental Advidsory Given, Teeth Intact, Missing Permanent bridge on the right upper:   Pulmonary neg shortness of breath, asthma (as a child) , neg COPD, neg recent URI,    Pulmonary exam normal        Cardiovascular Exercise Tolerance: Good hypertension, (-) angina(-) Past MI and (-) Cardiac Stents Normal cardiovascular exam(-) dysrhythmias (-) Valvular Problems/Murmurs     Neuro/Psych PSYCHIATRIC DISORDERS Anxiety negative neurological ROS     GI/Hepatic Neg liver ROS, GERD  ,  Endo/Other  diabetes (borderline)Hypothyroidism   Renal/GU negative Renal ROS  negative genitourinary   Musculoskeletal   Abdominal   Peds  Hematology negative hematology ROS (+)   Anesthesia Other Findings Past Medical History: 2011: Anxiety     Comment:  patient with anxiety will continue Alprazolam prn No date: Arthritis No date: GERD (gastroesophageal reflux disease)     Comment:  pt. with esophagea reflux. She reports great improvement              with Dexilant. No date: Hearing loss     Comment:  s/p hearing aids 02/20/2010: Herpes     Comment:  gential No date: Hyperlipidemia     Comment:  patient with HL on statins. lipids well controlled No date: Hypertension No date: Hypothyroidism No date: Pre-diabetes No date: Skin cancer   Reproductive/Obstetrics negative OB ROS                             Anesthesia Physical  Anesthesia Plan  ASA: II  Anesthesia Plan: General   Post-op Pain  Management:    Induction: Intravenous  PONV Risk Score and Plan: 2 and Propofol infusion and TIVA  Airway Management Planned: Natural Airway and Nasal Cannula  Additional Equipment:   Intra-op Plan:   Post-operative Plan:   Informed Consent: I have reviewed the patients History and Physical, chart, labs and discussed the procedure including the risks, benefits and alternatives for the proposed anesthesia with the patient or authorized representative who has indicated his/her understanding and acceptance.     Dental Advisory Given  Plan Discussed with: Anesthesiologist, CRNA and Surgeon  Anesthesia Plan Comments:         Anesthesia Quick Evaluation

## 2020-04-19 NOTE — Anesthesia Postprocedure Evaluation (Signed)
Anesthesia Post Note  Patient: Mary Pacheco  Procedure(s) Performed: COLONOSCOPY WITH PROPOFOL (N/A )  Patient location during evaluation: Endoscopy Anesthesia Type: General Level of consciousness: awake and alert Pain management: pain level controlled Vital Signs Assessment: post-procedure vital signs reviewed and stable Respiratory status: spontaneous breathing, nonlabored ventilation, respiratory function stable and patient connected to nasal cannula oxygen Cardiovascular status: blood pressure returned to baseline and stable Postop Assessment: no apparent nausea or vomiting Anesthetic complications: no   No complications documented.   Last Vitals:  Vitals:   04/19/20 1103 04/19/20 1113  BP: (!) 156/57 (!) 164/57  Pulse:    Resp:    Temp:    SpO2:      Last Pain:  Vitals:   04/19/20 1103  TempSrc:   PainSc: 0-No pain                 Martha Clan

## 2020-04-19 NOTE — Transfer of Care (Signed)
Immediate Anesthesia Transfer of Care Note  Patient: Mary Pacheco  Procedure(s) Performed: COLONOSCOPY WITH PROPOFOL (N/A )  Patient Location: Endoscopy Unit  Anesthesia Type:General  Level of Consciousness: awake  Airway & Oxygen Therapy: Patient Spontanous Breathing  Post-op Assessment: Post -op Vital signs reviewed and stable  Post vital signs: stable  Last Vitals:  Vitals Value Taken Time  BP 164/57 04/19/20 1115  Temp 36.2 C 04/19/20 1053  Pulse 66 04/19/20 1123  Resp 18 04/19/20 1123  SpO2 97 % 04/19/20 1123  Vitals shown include unvalidated device data.  Last Pain:  Vitals:   04/19/20 1103  TempSrc:   PainSc: 0-No pain         Complications: No complications documented.

## 2020-04-19 NOTE — Op Note (Signed)
[facility] Gastroenterology Patient Name: Mary Pacheco Procedure Date: 04/19/2020 10:03 AM MRN: [id number] Account #: [id number] Date of Birth: 1941-01-16 Admit Type: Outpatient Age: 79 Room: [facility] ENDO ROOM 3 Gender: Female Note Status: Finalized Procedure:             Colonoscopy Indications:           Positive fecal immunochemical test Providers:             Shayley Medlin B. Bonna Gains MD, MD Referring MD:          Yvetta Coder. Arnett (Referring MD) Medicines:             Monitored Anesthesia Care Complications:         No immediate complications. Procedure:             Pre-Anesthesia Assessment:                        - Prior to the procedure, a History and Physical was                         performed, and patient medications, allergies and                         sensitivities were reviewed. The patient's tolerance                         of previous anesthesia was reviewed.                        - The risks and benefits of the procedure and the                         sedation options and risks were discussed with the                         patient. All questions were answered and informed                         consent was obtained.                        - Patient identification and proposed procedure were                         verified prior to the procedure by the physician, the                         nurse, the anesthetist and the technician. The                         procedure was verified in the pre-procedure area in                         the procedure room in the endoscopy suite.                        - ASA Grade Assessment: II - A patient with mild  systemic disease.                        - After reviewing the risks and benefits, the patient                         was deemed in satisfactory condition to undergo the                         procedure.                        After obtaining informed consent, the  colonoscope was                         passed under direct vision. Throughout the procedure,                         the patient's blood pressure, pulse, and oxygen                         saturations were monitored continuously. The                         Colonoscope was introduced through the anus and                         advanced to the the cecum, identified by appendiceal                         orifice and ileocecal valve. The colonoscopy was                         performed with ease. The patient tolerated the                         procedure well. The quality of the bowel preparation                         was good. Findings:      The perianal and digital rectal examinations were normal.      The rectum, sigmoid colon, descending colon, transverse colon, ascending       colon and cecum appeared normal.      The retroflexed view of the distal rectum and anal verge was normal and       showed no anal or rectal abnormalities. Impression:            - The rectum, sigmoid colon, descending colon,                         transverse colon, ascending colon and cecum are normal.                        - The distal rectum and anal verge are normal on                         retroflexion view.                        - No  specimens collected. Recommendation:        - Discharge patient to home.                        - Resume previous diet.                        - Continue present medications.                        - Return to primary care physician as previously                         scheduled.                        - The findings and recommendations were discussed with                         the patient.                        - The findings and recommendations were discussed with                         the patient's family. Procedure Code(s):     --- Professional ---                        [id number], Colonoscopy, flexible; diagnostic, including                          collection of specimen(s) by brushing or washing, when                         performed (separate procedure) Diagnosis Code(s):     --- Professional ---                        R19.5, Other fecal abnormalities CPT copyright 2019 American Medical Association. All rights reserved. The codes documented in this report are preliminary and upon coder review may  be revised to meet current compliance requirements.  Vonda Antigua, MD Margretta Sidle B. Bonna Gains MD, MD 04/19/2020 10:57:40 AM This report has been signed electronically. Number of Addenda: 0 Note Initiated On: 04/19/2020 10:03 AM Scope Withdrawal Time: 0 hours 14 minutes 44 seconds  Total Procedure Duration: 0 hours 28 minutes 47 seconds       [facility]

## 2020-04-20 ENCOUNTER — Encounter: Payer: Self-pay | Admitting: Gastroenterology

## 2020-05-02 ENCOUNTER — Other Ambulatory Visit: Payer: Self-pay | Admitting: Family

## 2020-05-09 ENCOUNTER — Ambulatory Visit: Payer: Medicare Other | Admitting: Family

## 2020-05-12 ENCOUNTER — Other Ambulatory Visit: Payer: Self-pay

## 2020-05-16 ENCOUNTER — Ambulatory Visit: Payer: Medicare Other | Admitting: Family

## 2020-05-16 ENCOUNTER — Other Ambulatory Visit: Payer: Self-pay

## 2020-05-16 ENCOUNTER — Telehealth: Payer: Self-pay | Admitting: Family

## 2020-05-16 VITALS — BP 154/66 | HR 78 | Temp 98.0°F | Ht 63.5 in | Wt 140.8 lb

## 2020-05-16 DIAGNOSIS — M8580 Other specified disorders of bone density and structure, unspecified site: Secondary | ICD-10-CM | POA: Diagnosis not present

## 2020-05-16 DIAGNOSIS — E782 Mixed hyperlipidemia: Secondary | ICD-10-CM

## 2020-05-16 DIAGNOSIS — F419 Anxiety disorder, unspecified: Secondary | ICD-10-CM | POA: Diagnosis not present

## 2020-05-16 DIAGNOSIS — I10 Essential (primary) hypertension: Secondary | ICD-10-CM | POA: Diagnosis not present

## 2020-05-16 MED ORDER — ALPRAZOLAM 0.25 MG PO TABS: 0.1250 mg | ORAL_TABLET | Freq: Every day | ORAL | 1 refills | Status: DC | PRN

## 2020-05-16 NOTE — Progress Notes (Signed)
Subjective:    Patient ID: Mary Pacheco, female    DOB: 26-Jan-1941, 79 y.o.   MRN: [id number]  CC: Mary Pacheco is a 79 y.o. female who presents today for follow up.   HPI: HTN- compliant with medications. Feels better on decreased amlodipine 2.5mg  . She is on hctz  12.5mg .  At home 126/55,  136/53, HR 64. Thinks driving 'in traffic' this morning raised blood pressure and not feeling relaxed this morning in office.   Denies exertional chest pain or pressure, numbness or tingling radiating to left arm or jaw, palpitations, dizziness, frequent headaches, changes in vision, or shortness of breath.   Osteopenia-she has been on calcium supplement as advised from prior PCP. Not sure of what dose she had been on. Compliant vitamin D 2000 units. Using stationary bike 3 times per week.  No trouble swallowing nor upcoming dental procedures.   CAD, HLD - compliant zetia, lipitor.   Right knee arthroplasty surgery with Dr Marry Guan 05/30/20  Anxiety- doing well on rare use xanax. Would like refill.    colonoscopy UTD Crt 0.82 Ca 9.8  HISTORY:  Past Medical History:  Diagnosis Date   Anxiety 2011   patient with anxiety will continue Alprazolam prn   Arthritis    GERD (gastroesophageal reflux disease)    pt. with esophagea reflux. She reports great improvement with Dexilant.   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 lipids well controlled   Hypertension    Hypothyroidism    Pre-diabetes    Skin cancer    Past Surgical History:  Procedure Laterality Date   ABDOMINAL HYSTERECTOMY  1993   multiple fibroid cysts   cataract surgery  02/2011   bilateral   COLONOSCOPY WITH PROPOFOL N/A 04/19/2020   Procedure: COLONOSCOPY WITH PROPOFOL;  Surgeon: Virgel Manifold, MD;  Location: [facility];  Service: Endoscopy;  Laterality: N/A;   EYE SURGERY     KNEE ARTHROPLASTY Left 08/19/2019   Procedure:  COMPUTER ASSISTED TOTAL KNEE ARTHROPLASTY;  Surgeon: Dereck Leep, MD;  Location: [facility] OR[facility];  Service: Orthopedics;  Laterality: Left;   skin cancer removal     TONSILLECTOMY     Family History  Problem Relation Age of Onset   Heart failure Mother    Heart disease Mother        CABG   Hypertension Mother    Hyperlipidemia Mother    Stroke Mother        mini strokes, multiple   Heart attack Father    Heart failure Father    Hyperlipidemia Father    Hypertension Father    Heart attack Sister 15       MI   Hernia Sister    Prostate cancer Brother     Allergies: Molds & smuts, Rosuvastatin, and Simvastatin Current Outpatient Medications on File Prior to Visit  Medication Sig Dispense Refill   acetaminophen (TYLENOL) 500 MG tablet Take 1,000 mg by mouth every 6 (six) hours as needed for moderate pain.     acyclovir (ZOVIRAX) 400 MG tablet TAKE 1 TABLET BY MOUTH EVERY DAY 90 tablet 2   amLODipine (NORVASC) 2.5 MG tablet Take 1 tablet (2.5 mg total) by mouth daily. May take additional dose for blood pressure > 135/85. 90 tablet 3   Apoaequorin (PREVAGEN PO) Take 1 tablet by mouth daily.     atorvastatin (LIPITOR) 20 MG  tablet TAKE 1 TABLET BY MOUTH EVERY DAY 90 tablet 1   BAYER ASPIRIN EC LOW DOSE 81 MG EC tablet      Cholecalciferol (VITAMIN D) 50 MCG (2000 UT) tablet Take 2,000 Units by mouth daily.      Coenzyme Q10 (EQL COQ10) 200 MG capsule Take 200 mg by mouth daily.      CRANBERRY PO Take 650 mg by mouth daily.     ezetimibe (ZETIA) 10 MG tablet Take 1 tablet (10 mg total) by mouth daily. 90 tablet 3   Green Tea, Camellia sinensis, (GREEN TEA PO) Take 500 mg by mouth daily.      hydrochlorothiazide (MICROZIDE) 12.5 MG capsule TAKE 1 CAPSULE DAILY (MUST ESTABLISH WITH NEW PRIMARY CARE PHYSICIAN) 90 capsule 4   magnesium oxide (MAG-OX) 400 MG tablet Take 400 mg by mouth daily.     meloxicam (MOBIC) 7.5 MG tablet Take 1 tablet (7.5 mg total) by mouth  daily as needed for pain. Take with food 90 tablet 1   Misc Natural Products (TOTAL CARDIO HEALTH FORMULA PO) Take 1 tablet by mouth daily.       Omega-3 Fatty Acids (FISH OIL TRIPLE STRENGTH) 1400 MG CAPS Take 1,400 mg by mouth daily.      pantoprazole (PROTONIX) 40 MG tablet TAKE 1 TABLET DAILY 90 tablet 0   Probiotic Product (PROBIOTIC DAILY PO) Take 1 capsule by mouth daily.      SYNTHROID 50 MCG tablet TAKE 1 TABLET DAILY 90 tablet 4   No current facility-administered medications on file prior to visit.    Social History   Tobacco Use   Smoking status: Never Smoker   Smokeless tobacco: Never Used  Vaping Use   Vaping Use: Never used  Substance Use Topics   Alcohol use: Yes    Comment: socially   Drug use: No    Review of Systems  Constitutional: Negative for chills and fever.  Respiratory: Negative for cough.   Cardiovascular: Negative for chest pain and palpitations.  Gastrointestinal: Negative for nausea and vomiting.  Neurological: Negative for headaches.      Objective:    BP (!) 154/66 (BP Location: Left Arm, Patient Position: Sitting)    Pulse 78    Temp 98 F (36.7 C)    Ht 5' 3.5" (1.613 m)    Wt 140 lb 12.8 oz (63.9 kg)    SpO2 99%    BMI 24.55 kg/m  BP Readings from Last 3 Encounters:  05/16/20 (!) 154/66  04/19/20 (!) 164/57  04/18/20 134/62   Wt Readings from Last 3 Encounters:  05/16/20 140 lb 12.8 oz (63.9 kg)  04/19/20 140 lb 4.5 oz (63.6 kg)  04/18/20 140 lb (63.5 kg)    Physical Exam Vitals reviewed.  Constitutional:      Appearance: She is well-developed.  HENT:     Mouth/Throat:     Pharynx: Uvula midline.  Eyes:     Conjunctiva/sclera: Conjunctivae normal.     Pupils: Pupils are equal, round, and reactive to light.     Comments: Fundus normal bilaterally.   Cardiovascular:     Rate and Rhythm: Normal rate and regular rhythm.     Pulses: Normal pulses.     Heart sounds: Normal heart sounds.  Pulmonary:     Effort:  Pulmonary effort is normal.     Breath sounds: Normal breath sounds. No wheezing, rhonchi or rales.  Skin:    General: Skin is warm and dry.  Neurological:  Mental Status: She is alert.     Cranial Nerves: No cranial nerve deficit.     Sensory: No sensory deficit.     Deep Tendon Reflexes:     Reflex Scores:      Bicep reflexes are 2+ on the right side and 2+ on the left side.      Patellar reflexes are 2+ on the right side and 2+ on the left side.    Comments: Grip equal and strong bilateral upper extremities. Gait strong and steady. Able to perform rapid alternating movement without difficulty.   Psychiatric:        Speech: Speech normal.        Behavior: Behavior normal.        Thought Content: Thought content normal.        Assessment & Plan:   Problem List Items Addressed This Visit      Cardiovascular and Mediastinum   Essential hypertension    Elevated today. Reassured by normal neurologic exam and absence of signs or symptoms of hypertensive urgency. Patient admittedly felt more stress this morning. Blood pressure at home well controlled. She will reassess at home and take another dose of amlodipine 2.5mg  if BP > 140/90.         Musculoskeletal and Integument   Osteopenia    Discussed the meets criteria for treatment, FRAX hip 5.3% , major 17% . Discussed risks of jaw necrosis, esophagitis and atypical fracture on bisphosphonate. Patient will get dental clearance . She will consider bisphosphonate and review information on AVS regarding fosamax and let me know. She will start LOW dose of calcium in addition to vitamin D. We will continue to discuss treatment at follow up.         Other   Anxiety - Primary    Stable. Continue xanax.  I looked up patient on [place] Controlled Substances Reporting System PMP AWARE and saw no activity that raised concern of inappropriate use.        Relevant Medications   ALPRAZolam (XANAX) 0.25 MG tablet   Hyperlipidemia, unspecified      Controlled. Compliant with zetia, lipitor.           I have discontinued Charlett Nose T. Roscoe "Judy"'s (Flaxseed, Linseed, (FLAX SEED OIL PO)). I have also changed her ALPRAZolam. Additionally, I am having her maintain her Misc Natural Products (TOTAL CARDIO HEALTH FORMULA PO), Vitamin D, Fish Oil Triple Strength, Coenzyme Q10, Probiotic Product (PROBIOTIC DAILY PO), (Green Tea, Camellia sinensis, (GREEN TEA PO)), acetaminophen, CRANBERRY PO, Apoaequorin (PREVAGEN PO), Synthroid, ezetimibe, hydrochlorothiazide, acyclovir, meloxicam, Bayer Aspirin EC Low Dose, magnesium oxide, atorvastatin, amLODipine, and pantoprazole.   Meds ordered this encounter  Medications   ALPRAZolam (XANAX) 0.25 MG tablet    Sig: Take 0.5 tablets (0.125 mg total) by mouth daily as needed for anxiety.    Dispense:  30 tablet    Refill:  1    This request is for a new prescription for a controlled substance as required by Federal/State law.    Order Specific Question:   Supervising Provider    Answer:   Crecencio Mc [[id number]]    Return precautions given.   Risks, benefits, and alternatives of the medications and treatment plan prescribed today were discussed, and patient expressed understanding.   Education regarding symptom management and diagnosis given to patient on AVS.  Continue to follow with Burnard Hawthorne, FNP for routine health maintenance.   [name] and I agreed with plan.  Mable Paris, FNP

## 2020-05-16 NOTE — Patient Instructions (Addendum)
Call me when you get home with blood pressure after you have rested. Please take another dose of amlodipine 2.5mg  ( total of 5mg  in 24 hours) if blood pressure is greater than 140/90.  IF you have any chest pain,numbness or tingling radiating to left arm or jaw, palpitations, dizziness, frequent headaches, changes in vision, or shortness of breath- please go directly to emegencry room or call 911.   Call Dr Clydell Hakim office and ensure no cardiac clearance.   Please speak your dentist in regards to concerns or upcoming dental procedures as it related to starting fosamax, boniva.   For post menopausal women, guidelines recommend a diet with 1200 mg of Calcium per day. If you are eating calcium rich foods, you do not need a calcium supplement. The body better absorbs the calcium that you eat over supplementation. If you do supplement, I recommend not supplementing the full 1200 mg/ day as this can lead to increased risk of cardiovascular disease. I recommend Calcium Citrate over the counter, and you may take a total of 400 mg per day in divided doses with meals for best absorption.   For bone health, you need adequate vitamin D, and I recommend you supplement as it is harder to do so with diet alone. I recommend cholecalciferol 800 units daily.  Also, please ensure you are following a diet high in calcium -- research shows better outcomes with dietary sources including kale, yogurt, broccolii, cheese, okra, almonds- to name a few.    Also remember that exercise is a great medicine for maintain and preserve bone health. Advise moderate exercise for 30 minutes , 3 times per week.   Alendronate tablets What is this medicine? ALENDRONATE (a LEN droe nate) slows calcium loss from bones. It helps to make normal healthy bone and to slow bone loss in people with Paget's disease and osteoporosis. It may be used in others at risk for bone loss. This medicine may be used for other purposes; ask your health care  provider or pharmacist if you have questions. COMMON BRAND NAME(S): Fosamax What should I tell my health care provider before I take this medicine? They need to know if you have any of these conditions:  dental disease  esophagus, stomach, or intestine problems, like acid reflux or GERD  kidney disease  low blood calcium  low vitamin D  problems sitting or standing 30 minutes  trouble swallowing  an unusual or allergic reaction to alendronate, other medicines, foods, dyes, or preservatives  pregnant or trying to get pregnant  breast-feeding How should I use this medicine? You must take this medicine exactly as directed or you will lower the amount of the medicine you absorb into your body or you may cause yourself harm. Take this medicine by mouth first thing in the morning, after you are up for the day. Do not eat or drink anything before you take your medicine. Swallow the tablet with a full glass (6 to 8 fluid ounces) of plain water. Do not take this medicine with any other drink. Do not chew or crush the tablet. After taking this medicine, do not eat breakfast, drink, or take any medicines or vitamins for at least 30 minutes. Sit or stand up for at least 30 minutes after you take this medicine; do not lie down. Do not take your medicine more often than directed. Talk to your pediatrician regarding the use of this medicine in children. Special care may be needed. Overdosage: If you think you have taken  too much of this medicine contact a poison control center or emergency room at once. NOTE: This medicine is only for you. Do not share this medicine with others. What if I miss a dose? If you miss a dose, do not take it later in the day. Continue your normal schedule starting the next morning. Do not take double or extra doses. What may interact with this medicine?  aluminum hydroxide  antacids  aspirin  calcium supplements  drugs for inflammation like ibuprofen, naproxen,  and others  iron supplements  magnesium supplements  vitamins with minerals This list may not describe all possible interactions. Give your health care provider a list of all the medicines, herbs, non-prescription drugs, or dietary supplements you use. Also tell them if you smoke, drink alcohol, or use illegal drugs. Some items may interact with your medicine. What should I watch for while using this medicine? Visit your doctor or health care professional for regular checks ups. It may be some time before you see benefit from this medicine. Do not stop taking your medicine except on your doctor's advice. Your doctor or health care professional may order blood tests and other tests to see how you are doing. You should make sure you get enough calcium and vitamin D while you are taking this medicine, unless your doctor tells you not to. Discuss the foods you eat and the vitamins you take with your health care professional. Some people who take this medicine have severe bone, joint, and/or muscle pain. This medicine may also increase your risk for a broken thigh bone. Tell your doctor right away if you have pain in your upper leg or groin. Tell your doctor if you have any pain that does not go away or that gets worse. This medicine can make you more sensitive to the sun. If you get a rash while taking this medicine, sunlight may cause the rash to get worse. Keep out of the sun. If you cannot avoid being in the sun, wear protective clothing and use sunscreen. Do not use sun lamps or tanning beds/booths. What side effects may I notice from receiving this medicine? Side effects that you should report to your doctor or health care professional as soon as possible:  allergic reactions like skin rash, itching or hives, swelling of the face, lips, or tongue  black or tarry stools  bone, muscle or joint pain  changes in vision  Managing Your Hypertension Hypertension is commonly called high blood  pressure. This is when the force of your blood pressing against the walls of your arteries is too strong. Arteries are blood vessels that carry blood from your heart throughout your body. Hypertension forces the heart to work harder to pump blood, and may cause the arteries to become narrow or stiff. Having untreated or uncontrolled hypertension can cause heart attack, stroke, kidney disease, and other problems. What are blood pressure readings? A blood pressure reading consists of a higher number over a lower number. Ideally, your blood pressure should be below 120/80. The first ("top") number is called the systolic pressure. It is a measure of the pressure in your arteries as your heart beats. The second ("bottom") number is called the diastolic pressure. It is a measure of the pressure in your arteries as the heart relaxes. What does my blood pressure reading mean? Blood pressure is classified into four stages. Based on your blood pressure reading, your health care provider may use the following stages to determine what type of treatment  you need, if any. Systolic pressure and diastolic pressure are measured in a unit called mm Hg. Normal Systolic pressure: below 269. Diastolic pressure: below 80. Elevated Systolic pressure: 485-462. Diastolic pressure: below 80. Hypertension stage 1 Systolic pressure: 703-500. Diastolic pressure: 93-81. Hypertension stage 2 Systolic pressure: 829 or above. Diastolic pressure: 90 or above. What health risks are associated with hypertension? Managing your hypertension is an important responsibility. Uncontrolled hypertension can lead to: A heart attack. A stroke. A weakened blood vessel (aneurysm). Heart failure. Kidney damage. Eye damage. Metabolic syndrome. Memory and concentration problems. What changes can I make to manage my hypertension? Hypertension can be managed by making lifestyle changes and possibly by taking medicines. Your health care  provider will help you make a plan to bring your blood pressure within a normal range. Eating and drinking  Eat a diet that is high in fiber and potassium, and low in salt (sodium), added sugar, and fat. An example eating plan is called the DASH (Dietary Approaches to Stop Hypertension) diet. To eat this way: Eat plenty of fresh fruits and vegetables. Try to fill half of your plate at each meal with fruits and vegetables. Eat whole grains, such as whole wheat pasta, brown rice, or whole grain bread. Fill about one quarter of your plate with whole grains. Eat low-fat diary products. Avoid fatty cuts of meat, processed or cured meats, and poultry with skin. Fill about one quarter of your plate with lean proteins such as fish, chicken without skin, beans, eggs, and tofu. Avoid premade and processed foods. These tend to be higher in sodium, added sugar, and fat. Reduce your daily sodium intake. Most people with hypertension should eat less than 1,500 mg of sodium a day. Limit alcohol intake to no more than 1 drink a day for nonpregnant women and 2 drinks a day for men. One drink equals 12 oz of beer, 5 oz of wine, or 1 oz of hard liquor. Lifestyle Work with your health care provider to maintain a healthy body weight, or to lose weight. Ask what an ideal weight is for you. Get at least 30 minutes of exercise that causes your heart to beat faster (aerobic exercise) most days of the week. Activities may include walking, swimming, or biking. Include exercise to strengthen your muscles (resistance exercise), such as weight lifting, as part of your weekly exercise routine. Try to do these types of exercises for 30 minutes at least 3 days a week. Do not use any products that contain nicotine or tobacco, such as cigarettes and e-cigarettes. If you need help quitting, ask your health care provider. Control any long-term (chronic) conditions you have, such as high cholesterol or diabetes. Monitoring Monitor  your blood pressure at home as told by your health care provider. Your personal target blood pressure may vary depending on your medical conditions, your age, and other factors. Have your blood pressure checked regularly, as often as told by your health care provider. Working with your health care provider Review all the medicines you take with your health care provider because there may be side effects or interactions. Talk with your health care provider about your diet, exercise habits, and other lifestyle factors that may be contributing to hypertension. Visit your health care provider regularly. Your health care provider can help you create and adjust your plan for managing hypertension. Will I need medicine to control my blood pressure? Your health care provider may prescribe medicine if lifestyle changes are not enough to get your  blood pressure under control, and if: Your systolic blood pressure is 130 or higher. Your diastolic blood pressure is 80 or higher. Take medicines only as told by your health care provider. Follow the directions carefully. Blood pressure medicines must be taken as prescribed. The medicine does not work as well when you skip doses. Skipping doses also puts you at risk for problems. Contact a health care provider if: You think you are having a reaction to medicines you have taken. You have repeated (recurrent) headaches. You feel dizzy. You have swelling in your ankles. You have trouble with your vision. Get help right away if: You develop a severe headache or confusion. You have unusual weakness or numbness, or you feel faint. You have severe pain in your chest or abdomen. You vomit repeatedly. You have trouble breathing. Summary Hypertension is when the force of blood pumping through your arteries is too strong. If this condition is not controlled, it may put you at risk for serious complications. Your personal target blood pressure may vary depending on your  medical conditions, your age, and other factors. For most people, a normal blood pressure is less than 120/80. Hypertension is managed by lifestyle changes, medicines, or both. Lifestyle changes include weight loss, eating a healthy, low-sodium diet, exercising more, and limiting alcohol. This information is not intended to replace advice given to you by your health care provider. Make sure you discuss any questions you have with your health care provider. Document Revised: 01/30/2019 Document Reviewed: 09/05/2016 Elsevier Patient Education  [place].  chest pain  heartburn or stomach pain  jaw pain, especially after dental work  pain or trouble when swallowing  redness, blistering, peeling or loosening of the skin, including inside the mouth Side effects that usually do not require medical attention (report to your doctor or health care professional if they continue or are bothersome):  changes in taste  diarrhea or constipation  eye pain or itching  headache  nausea or vomiting  stomach gas or fullness This list may not describe all possible side effects. Call your doctor for medical advice about side effects. You may report side effects to FDA at 1-800-FDA-1088. Where should I keep my medicine? Keep out of the reach of children. Store at room temperature of 15 and 30 degrees C (59 and 86 degrees F). Throw away any unused medicine after the expiration date. NOTE: This sheet is a summary. It may not cover all possible information. If you have questions about this medicine, talk to your doctor, pharmacist, or health care provider.  2020 Elsevier/Gold Standard (2011-04-06 08:56:09)

## 2020-05-16 NOTE — Assessment & Plan Note (Signed)
Stable. Continue xanax.  I looked up patient on [place] Controlled Substances Reporting System PMP AWARE and saw no activity that raised concern of inappropriate use.

## 2020-05-16 NOTE — Telephone Encounter (Signed)
Pt BP was 132/57  72

## 2020-05-16 NOTE — Assessment & Plan Note (Addendum)
Discussed the meets criteria for treatment, FRAX hip 5.3% , major 17% . Discussed risks of jaw necrosis, esophagitis and atypical fracture on bisphosphonate. Patient will get dental clearance . She will consider bisphosphonate and review information on AVS regarding fosamax and let me know. She will start LOW dose of calcium in addition to vitamin D. We will continue to discuss treatment at follow up.

## 2020-05-16 NOTE — Discharge Instructions (Signed)
  Instructions after Knee Arthroscopy    Mary Pacheco, [name], M.D.     Dept. of Orthopaedics & Sports Medicine  [facility]  [address]  [place], [place]  [address]   Phone: [phone]   Fax: [phone]   DIET: Drink plenty of non-alcoholic fluids & begin a light diet. Resume your normal diet the day after surgery.  ACTIVITY:  You may use crutches or a walker with weight-bearing as tolerated, unless instructed otherwise. You may wean yourself off of the walker or crutches as tolerated.  Begin doing gentle exercises. Exercising will reduce the pain and swelling, increase motion, and prevent muscle weakness.   Avoid strenuous activities or athletics for a minimum of 4-6 weeks after arthroscopic surgery. Do not drive or operate any equipment until instructed.  WOUND CARE:  Place one to two pillows under the knee the first day or two when sitting or lying.  Continue to use the ice packs periodically to reduce pain and swelling. The small incisions in your knee are closed with nylon stitches. The stitches will be removed in the office. The bulky dressing may be removed on the second day after surgery. DO NOT TOUCH THE STITCHES. Put a Band-Aid over each stitch. Do NOT use any ointments or creams on the incisions.  You may bathe or shower after the stitches are removed at the first office visit following surgery.  MEDICATIONS: You may resume your regular medications. Please take the pain medication as prescribed. Do not take pain medication on an empty stomach. Do not drive or drink alcoholic beverages when taking pain medications.  CALL THE OFFICE FOR: Temperature above 101 degrees Excessive bleeding or drainage on the dressing. Excessive swelling, coldness, or paleness of the toes. Persistent nausea and vomiting.  FOLLOW-UP:  You should have an appointment to return to the office in 7-10 days after surgery.     [facility] Department Directory          [email or web address]       [email or web address]          Cardiology  Appointments: [place] - [phone] [place] - [phone]  Endocrinology  Appointments: [place] - [phone] [place] - [phone]  Gastroenterology  Appointments: [place] - [phone] [place] - [phone]        General Surgery   Appointments: [place] - [phone]  Internal Medicine/Family Medicine  Appointments: [place] - [phone] [place] - [phone] [place] - [phone]  Metabolic and Weigh Loss Surgery  Appointments: [place] - [phone]        Neurology  Appointments: [place] - [phone] [place] - [phone]  Neurosurgery  Appointments: [place] - [phone]  Obstetrics & Gynecology  Appointments: [place] - [phone] [place] - [phone]        Pediatrics  Appointments: [place] - [phone] [place] - [phone]  Physiatry  Appointments: [place] -[phone]  Physical Therapy  Appointments: [place] - [phone] [place] - [phone]        Podiatry  Appointments: [place] - [phone] [place] - [phone]  Pulmonology  Appointments: [place] - [phone]  Rheumatology  Appointments: [place] - [phone]        [place] Location: [facility]  [address] , [place]  [address]  [place] Location: [facility] [address] [place], [place]  [address]  [place] Location: [facility] [address] [place], [place]  [address]    

## 2020-05-16 NOTE — Assessment & Plan Note (Addendum)
Elevated today. Reassured by normal neurologic exam and absence of signs or symptoms of hypertensive urgency. Patient admittedly felt more stress this morning. Blood pressure at home well controlled. She will reassess at home and take another dose of amlodipine 2.5mg  if BP > 140/90.

## 2020-05-16 NOTE — Telephone Encounter (Signed)
Noted. Well controlled.  

## 2020-05-16 NOTE — Assessment & Plan Note (Signed)
Controlled. Compliant with zetia, lipitor.

## 2020-05-24 ENCOUNTER — Encounter
Admission: RE | Admit: 2020-05-24 | Discharge: 2020-05-24 | Disposition: A | Discharge status: Home / Self Care | Payer: Medicare Other | Source: Ambulatory Visit | Attending: Orthopedic Surgery | Admitting: Orthopedic Surgery

## 2020-05-24 ENCOUNTER — Other Ambulatory Visit: Payer: Self-pay

## 2020-05-24 HISTORY — DX: Anemia, unspecified: D64.9

## 2020-05-24 HISTORY — DX: Unspecified asthma, uncomplicated: J45.909

## 2020-05-24 NOTE — Patient Instructions (Signed)
Your procedure is scheduled on: Monday May 30, 2020. Report to Day Surgery. To find out your arrival time please call [phone] between 1PM - 3PM on Friday May 27, 2020.  Remember: Instructions that are not followed completely may result in serious medical risk,  up to and including death, or upon the discretion of your surgeon and anesthesiologist your  surgery may need to be rescheduled.     _X__ 1. Do not eat food after midnight the night before your procedure.                 No gum chewing or hard candies. You may drink clear liquids up to 2 hours                 before you are scheduled to arrive for your surgery- DO not drink clear                 liquids within 2 hours of the start of your surgery.                 Clear Liquids include:  water, apple juice without pulp, clear Gatorade, G2 or                  Gatorade Zero (avoid Red/Purple/Blue), Black Coffee or Tea (Do not add                 anything to coffee or tea).  __X__2.   Complete the carbohydrate drink provided to you, 2 hours before arrival.  __X__3.  On the morning of surgery brush your teeth with toothpaste and water, you                may rinse your mouth with mouthwash if you wish.  Do not swallow any toothpaste of mouthwash.     _X__ 4.  No Alcohol for 24 hours before or after surgery.   _X__ 5.  Do Not Smoke or use e-cigarettes For 24 Hours Prior to Your Surgery.                 Do not use any chewable tobacco products for at least 6 hours prior to                 Surgery.  _X__  6.  Do not use any recreational drugs (marijuana, cocaine, heroin, ecstacy, MDMA or other)                For at least one week prior to your surgery.  Combination of these drugs with anesthesia                May have life threatening results.  __X__  7.  Notify your doctor if there is any change in your medical condition      (cold, fever, infections).     Do not wear jewelry, make-up, hairpins,  clips or nail polish. Do not wear lotions, powders, or perfumes. You may wear deodorant. Do not shave 48 hours prior to surgery. Men may shave face and neck. Do not bring valuables to the hospital.    [facility] is not responsible for any belongings or valuables.  Contacts, dentures or bridgework may not be worn into surgery. Leave your suitcase in the car. After surgery it may be brought to your room. For patients admitted to the hospital, discharge time is determined by your treatment team.   Patients discharged the day of surgery will not be allowed  to drive home.   Make arrangements for someone to be with you for the first 24 hours of your Same Day Discharge.   __X__ Take these medicines the morning of surgery with A SIP OF WATER:    1. amLODipine (NORVASC) 2.5 MG  2. pantoprazole (PROTONIX) 40 MG   3. SYNTHROID 50 MCG   __X__ Use CHG Soap as directed  __X__ Stop aspirin as instructed by your doctor.   __X__ Stop Anti-inflammatories such as meloxicam (MOBIC), Ibuprofen, Aleve, Advil, naproxen and or BC powders.    __X__ Stop supplements until after surgery.    __X__ Do not start any herbal supplements before your surgery.

## 2020-05-26 ENCOUNTER — Other Ambulatory Visit
Admission: RE | Admit: 2020-05-26 | Discharge: 2020-05-26 | Disposition: A | Discharge status: Home / Self Care | Payer: Medicare Other | Source: Ambulatory Visit | Attending: Orthopedic Surgery | Admitting: Orthopedic Surgery

## 2020-05-26 ENCOUNTER — Other Ambulatory Visit: Payer: Self-pay

## 2020-05-26 DIAGNOSIS — Z20822 Contact with and (suspected) exposure to covid-19: Secondary | ICD-10-CM | POA: Insufficient documentation

## 2020-05-26 DIAGNOSIS — Z01812 Encounter for preprocedural laboratory examination: Secondary | ICD-10-CM | POA: Diagnosis present

## 2020-05-27 LAB — SARS CORONAVIRUS 2 (TAT 6-24 HRS): SARS Coronavirus 2: NEGATIVE

## 2020-05-30 ENCOUNTER — Ambulatory Visit: Admission: RE | Admit: 2020-05-30 | Payer: Medicare Other | Source: Home / Self Care | Admitting: Orthopedic Surgery

## 2020-05-30 ENCOUNTER — Encounter: Admission: RE | Payer: Self-pay | Source: Home / Self Care

## 2020-05-30 SURGERY — ARTHROSCOPY, KNEE
Anesthesia: Choice | Site: Knee | Laterality: Right

## 2020-07-11 ENCOUNTER — Telehealth: Payer: Self-pay | Admitting: Family

## 2020-07-11 NOTE — Telephone Encounter (Signed)
Patient called and made aware of normal results.

## 2020-07-11 NOTE — Telephone Encounter (Signed)
Call pt [facility]amm[facility] at [facility] normal Repeat 1 year

## 2020-07-24 ENCOUNTER — Other Ambulatory Visit: Payer: Self-pay | Admitting: Family

## 2020-07-24 ENCOUNTER — Other Ambulatory Visit: Payer: Self-pay | Admitting: Cardiovascular Disease

## 2020-08-15 ENCOUNTER — Other Ambulatory Visit: Payer: Self-pay | Admitting: Family

## 2020-08-16 ENCOUNTER — Ambulatory Visit: Payer: Medicare Other | Admitting: Family

## 2020-08-26 ENCOUNTER — Telehealth: Payer: Self-pay | Admitting: Family

## 2020-08-26 DIAGNOSIS — I10 Essential (primary) hypertension: Secondary | ICD-10-CM

## 2020-08-26 NOTE — Telephone Encounter (Signed)
Want me to order routine labs?

## 2020-08-26 NOTE — Telephone Encounter (Signed)
Pt would like lab orders to be placed before her 11/15 appt

## 2020-08-28 NOTE — Telephone Encounter (Signed)
Call pt Fasting labs ordered She may have 2-3 days ahead of appt

## 2020-08-29 NOTE — Telephone Encounter (Signed)
Patient scheduled for fasting labs 11/11.

## 2020-09-01 ENCOUNTER — Other Ambulatory Visit: Payer: Self-pay

## 2020-09-01 ENCOUNTER — Other Ambulatory Visit (INDEPENDENT_AMBULATORY_CARE_PROVIDER_SITE_OTHER): Payer: Medicare Other

## 2020-09-01 DIAGNOSIS — I10 Essential (primary) hypertension: Secondary | ICD-10-CM

## 2020-09-01 LAB — COMPREHENSIVE METABOLIC PANEL
ALT: 17 U/L (ref 0–35)
AST: 22 U/L (ref 0–37)
Albumin: 5 g/dL (ref 3.5–5.2)
Alkaline Phosphatase: 84 U/L (ref 39–117)
BUN: 20 mg/dL (ref 6–23)
CO2: 29 mEq/L (ref 19–32)
Calcium: 10.1 mg/dL (ref 8.4–10.5)
Chloride: 97 mEq/L (ref 96–112)
Creatinine, Ser: 0.85 mg/dL (ref 0.40–1.20)
GFR: 64.85 mL/min (ref >60.00)
Glucose, Bld: 100 mg/dL — ABNORMAL HIGH (ref 70–99)
Potassium: 4 mEq/L (ref 3.5–5.1)
Sodium: 135 mEq/L (ref 135–145)
Total Bilirubin: 0.8 mg/dL (ref 0.2–1.2)
Total Protein: 7.3 g/dL (ref 6.0–8.3)

## 2020-09-01 LAB — LIPID PANEL
Cholesterol: 143 mg/dL (ref 0–200)
HDL: 76.4 mg/dL (ref >39.00)
LDL Cholesterol: 48 mg/dL (ref 0–99)
NonHDL: 66.39
Total CHOL/HDL Ratio: 2
Triglycerides: 94 mg/dL (ref 0.0–149.0)
VLDL: 18.8 mg/dL (ref 0.0–40.0)

## 2020-09-01 LAB — TSH: TSH: 1.66 u[IU]/mL (ref 0.35–4.50)

## 2020-09-05 ENCOUNTER — Ambulatory Visit: Payer: Medicare Other | Admitting: Family

## 2020-09-05 ENCOUNTER — Ambulatory Visit (INDEPENDENT_AMBULATORY_CARE_PROVIDER_SITE_OTHER): Payer: Medicare Other

## 2020-09-05 ENCOUNTER — Encounter: Payer: Self-pay | Admitting: Family

## 2020-09-05 ENCOUNTER — Other Ambulatory Visit: Payer: Self-pay

## 2020-09-05 DIAGNOSIS — M25512 Pain in left shoulder: Secondary | ICD-10-CM | POA: Diagnosis not present

## 2020-09-05 DIAGNOSIS — G8929 Other chronic pain: Secondary | ICD-10-CM

## 2020-09-05 DIAGNOSIS — I10 Essential (primary) hypertension: Secondary | ICD-10-CM

## 2020-09-05 DIAGNOSIS — F419 Anxiety disorder, unspecified: Secondary | ICD-10-CM

## 2020-09-05 DIAGNOSIS — M8580 Other specified disorders of bone density and structure, unspecified site: Secondary | ICD-10-CM | POA: Diagnosis not present

## 2020-09-05 NOTE — Patient Instructions (Addendum)
Please monitor blood pressure at home and if doesn't return to your baseline , remember you can  Take an additional dose of amlodipine 2.5mg  for blood pressure > 140/70.  We will continue monitor your bone health.   For post menopausal women, guidelines recommend a diet with 1200 mg of Calcium per day. If you are eating calcium rich foods, you do not need a calcium supplement. The body better absorbs the calcium that you eat over supplementation. If you do supplement, I recommend not supplementing the full 1200 mg/ day as this can lead to increased risk of cardiovascular disease. I recommend Calcium Citrate over the counter, and you may take a total of 600 to 800 mg per day in divided doses with meals for best absorption.   For bone health, you need adequate vitamin D, and I recommend you supplement as it is harder to do so with diet alone. I recommend cholecalciferol 800 units daily.  Also, please ensure you are following a diet high in calcium -- research shows better outcomes with dietary sources including kale, yogurt, broccolii, cheese, okra, almonds- to name a few.     Also remember that exercise is a great medicine for maintain and preserve bone health. Advise moderate exercise for 30 minutes , 3 times per week.

## 2020-09-05 NOTE — Progress Notes (Signed)
Subjective:    Patient ID: Mary Pacheco, female    DOB: December 24, 1940, 79 y.o.   MRN: [id number]  CC: Mary Pacheco is a 79 y.o. female who presents today for follow up.   HPI: Left arm pain 3-4 months,  Waxes and wanes. Posterior scapular pain when reaches to pick up something low or lifting a milk jug No pain with lateral raise.  Pain in the 'muscle' of upper arm and radiates to finger tips.  No fever, arm swelling, numbness, weakness.    H/o neck pain. No neck pain currently. Has started to use the over the door traction which has helped with the left shoulder. Heat and CBD oil with some relief. Takes tylenol with some relief. hasnt been using mobic.   Right handed.  Notes her covid vaccines (2) and influenza have been given in the left arm.     osteopenia- compliant with vitamin D 2000 units. Not taking calcium; never had dental clearance. Using stationary bike.   Anxiety- well controlled. Takes half tablet xanax prn monthly.    HTN- compliant with amlodipine 2.5mg . Today at home 115/47. Usually  Around 115- 120/ 50-70 Hast taken 2.5mg  amlodpine prn dose. NO cp, sob.      Follows with dr Marry Guan and she has been trying to have right knee surgery, however has been denied twice with insurance.  H/o left TKR.   HISTORY:  Past Medical History:  Diagnosis Date  . Anemia    in the past   . Anxiety 2011   patient with anxiety will continue Alprazolam prn  . Arthritis   . Asthma    grew up with asthma, but not as an adult   . GERD (gastroesophageal reflux disease)    pt. with esophagea reflux. She reports great improvement with Dexilant.  [name] Hearing loss    s/p hearing aids  . 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 Hyperlipidemia    patient with HL on statins. lipids well controlled  . Hypertension   . Hypothyroidism   . Pre-diabetes   . Skin cancer    Past Surgical History:  Procedure Laterality Date  . ABDOMINAL EXPLORATION SURGERY  1972  . ABDOMINAL HYSTERECTOMY  1993    multiple fibroid cysts  . cataract surgery  02/2011   bilateral  . COLONOSCOPY WITH PROPOFOL N/A 04/19/2020   Procedure: COLONOSCOPY WITH PROPOFOL;  Surgeon: Virgel Manifold, MD;  Location: [facility] END[facility];  Service: Endoscopy;  Laterality: N/A;  . EYE SURGERY    . JOINT REPLACEMENT    . 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 skin cancer removal    . TONSILLECTOMY     Family History  Problem Relation Age of Onset  . Heart failure Mother   . Heart disease Mother        CABG  . Hypertension Mother   . Hyperlipidemia Mother   . Stroke Mother        mini strokes, multiple  . Heart attack Father   . Heart failure Father   . Hyperlipidemia Father   . Hypertension Father   . Heart attack Sister 49       MI  . Hernia Sister   . Prostate cancer Brother     Allergies: Molds & smuts, Rosuvastatin, and Simvastatin Current Outpatient Medications on File Prior to Visit  Medication Sig  Dispense Refill  . acetaminophen (TYLENOL) 500 MG tablet Take 1,000 mg by mouth every 6 (six) hours as needed for moderate pain.    [name] acyclovir (ZOVIRAX) 400 MG tablet TAKE 1 TABLET BY MOUTH EVERY DAY (Patient taking differently: Take 400 mg by mouth daily. ) 90 tablet 2  . ALPRAZolam (XANAX) 0.25 MG tablet Take 0.5 tablets (0.125 mg total) by mouth daily as needed for anxiety. 30 tablet 1  . amLODipine (NORVASC) 2.5 MG tablet Take 1 tablet (2.5 mg total) by mouth daily. May take additional dose for blood pressure > 135/85. 90 tablet 3  . Apoaequorin (PREVAGEN) 10 MG CAPS Take 1 tablet by mouth daily.     [name] atorvastatin (LIPITOR) 20 MG tablet TAKE 1 TABLET BY MOUTH EVERY DAY (Patient taking differently: Take 20 mg by mouth daily. ) 90 tablet 1  . BAYER ASPIRIN EC LOW DOSE 81 MG EC tablet Take 81 mg by mouth daily.     . Cholecalciferol (VITAMIN D) 50 MCG (2000  UT) tablet Take 2,000 Units by mouth daily.     . Coenzyme Q10 (EQL COQ10) 200 MG capsule Take 200 mg by mouth daily.     [name] CRANBERRY PO Take 650 mg by mouth daily.    [name] ezetimibe (ZETIA) 10 MG tablet TAKE 1 TABLET DAILY 90 tablet 2  . Green Tea, Camellia sinensis, (GREEN TEA PO) Take 500 mg by mouth daily.     . hydrochlorothiazide (MICROZIDE) 12.5 MG capsule TAKE 1 CAPSULE DAILY (MUST ESTABLISH WITH NEW PRIMARY CARE PHYSICIAN) (Patient taking differently: Take 12.5 mg by mouth daily. ) 90 capsule 4  . magnesium oxide (MAG-OX) 400 MG tablet Take 400 mg by mouth daily.    . meloxicam (MOBIC) 7.5 MG tablet Take 1 tablet (7.5 mg total) by mouth daily as needed for pain. Take with food 90 tablet 1  . Misc Natural Products (TOTAL CARDIO HEALTH FORMULA PO) Take 1 tablet by mouth daily.      . Omega-3 Fatty Acids (FISH OIL TRIPLE STRENGTH) 1400 MG CAPS Take 1,400 mg by mouth daily.     . pantoprazole (PROTONIX) 40 MG tablet TAKE 1 TABLET DAILY 90 tablet 0  . Probiotic Product (PROBIOTIC DAILY PO) Take 1 capsule by mouth daily.     [name] SYNTHROID 50 MCG tablet TAKE 1 TABLET DAILY 90 tablet 3   No current facility-administered medications on file prior to visit.    Social History   Tobacco Use  . Smoking status: Never Smoker  . Smokeless tobacco: Never Used  Vaping Use  . Vaping Use: Never used  Substance Use Topics  . Alcohol use: Yes    Comment: socially  . Drug use: No    Review of Systems  Constitutional: Negative for chills and fever.  Eyes: Negative for visual disturbance.  Respiratory: Negative for cough.   Cardiovascular: Negative for chest pain and palpitations.  Gastrointestinal: Negative for nausea and vomiting.  Musculoskeletal: Positive for arthralgias. Negative for neck pain.  Neurological: Negative for weakness, numbness and headaches.      Objective:    BP (!) 144/62   Pulse 71   Temp 98.3 F (36.8 C)   Ht 5' 3.5" (1.613 m)   Wt 142 lb 12.8 oz (64.8 kg)   SpO2 99%    BMI 24.90 kg/m  BP Readings from Last 3 Encounters:  09/05/20 (!) 144/62  05/16/20 (!) 154/66  04/19/20 (!) 164/57   Wt Readings from Last 3 Encounters:  09/05/20  142 lb 12.8 oz (64.8 kg)  05/24/20 139 lb (63 kg)  05/16/20 140 lb 12.8 oz (63.9 kg)    Physical Exam Vitals reviewed.  Constitutional:      Appearance: She is well-developed.  Eyes:     Conjunctiva/sclera: Conjunctivae normal.  Cardiovascular:     Rate and Rhythm: Normal rate and regular rhythm.     Pulses: Normal pulses.     Heart sounds: Normal heart sounds.  Pulmonary:     Effort: Pulmonary effort is normal.     Breath sounds: Normal breath sounds. No wheezing, rhonchi or rales.  Musculoskeletal:     Cervical back: Normal. No bony tenderness. No pain with movement.     Comments: Left Shoulder:   No asymmetry of shoulders when comparing right and left.No pain with palpation over glenohumeral joint lines, Economy joint, AC joint, or bicipital groove. pain with internal and external rotation. No pain with resisted lateral extension .   Negative active painful arc sign.   Strength and sensation normal BUE's.   Skin:    General: Skin is warm and dry.  Neurological:     Mental Status: She is alert.  Psychiatric:        Speech: Speech normal.        Behavior: Behavior normal.        Thought Content: Thought content normal.        Assessment & Plan:   Problem List Items Addressed This Visit      Cardiovascular and Mediastinum   Essential hypertension    Elevated today. Advised to monitor blood pressure at home and if doesn't return to baseline,she may take an additional dose of amlodipine 2.5mg  for blood pressure > 140/70. Continue amlodipine 2.5mg , hctz 12.5mg .         Musculoskeletal and Integument   Osteopenia    Declines treatment with fosamax at this time, will follow        Other   Anxiety    Stable. Continue xanax 0.25mg  prn.       Left shoulder pain    Concern for rotator cuff etiology  and advised orthopedic consult after Xray, we have made her an appointment with Dr Marry Guan to discuss. Will follow.       Relevant Orders   DG Shoulder Left (Completed)       I am having Mary Liter. [place] "Bethena Roys" maintain her Misc Natural Products (TOTAL CARDIO HEALTH FORMULA PO), Vitamin D, Fish Oil Triple Strength, Coenzyme Q10, Probiotic Product (PROBIOTIC DAILY PO), (Green Tea, Camellia sinensis, (GREEN TEA PO)), acetaminophen, CRANBERRY PO, Apoaequorin, hydrochlorothiazide, acyclovir, meloxicam, Bayer Aspirin EC Low Dose, magnesium oxide, atorvastatin, amLODipine, ALPRAZolam, Synthroid, ezetimibe, and pantoprazole.   No orders of the defined types were placed in this encounter.   Return precautions given.   Risks, benefits, and alternatives of the medications and treatment plan prescribed today were discussed, and patient expressed understanding.   Education regarding symptom management and diagnosis given to patient on AVS.  Continue to follow with Burnard Hawthorne, FNP for routine health maintenance.   Mary Pacheco and I agreed with plan.   Mable Paris, FNP

## 2020-09-07 NOTE — Assessment & Plan Note (Signed)
Stable. Continue xanax 0.25mg prn.  

## 2020-09-07 NOTE — Assessment & Plan Note (Signed)
Elevated today. Advised to monitor blood pressure at home and if doesn't return to baseline,she may take an additional dose of amlodipine 2.5mg  for blood pressure > 140/70. Continue amlodipine 2.5mg , hctz 12.5mg .

## 2020-09-07 NOTE — Assessment & Plan Note (Addendum)
Concern for rotator cuff etiology and advised orthopedic consult after Xray, we have made her an appointment with Dr Marry Guan to discuss. Will follow.

## 2020-09-07 NOTE — Assessment & Plan Note (Addendum)
Declines treatment with fosamax at this time, will follow

## 2020-09-11 ENCOUNTER — Other Ambulatory Visit: Payer: Self-pay | Admitting: Internal Medicine

## 2020-10-19 ENCOUNTER — Other Ambulatory Visit: Payer: Self-pay | Admitting: Family

## 2020-10-31 ENCOUNTER — Other Ambulatory Visit: Payer: Self-pay

## 2020-10-31 ENCOUNTER — Telehealth: Payer: Self-pay | Admitting: *Deleted

## 2020-10-31 ENCOUNTER — Encounter
Admission: RE | Admit: 2020-10-31 | Discharge: 2020-10-31 | Disposition: A | Discharge status: Home / Self Care | Payer: Medicare Other | Source: Ambulatory Visit | Attending: Orthopedic Surgery | Admitting: Orthopedic Surgery

## 2020-10-31 NOTE — Telephone Encounter (Signed)
Request for pre-operative cardiac clearance Received: Today Karen Kitchens, NP  P [facility] Request for pre-operative cardiac clearance:    1. What type of surgery is being performed?  RIGHT knee arthroscopy   2. When is this surgery scheduled?  11/07/2020    3. Are there any medications that need to be held prior to surgery?  ASA per cardiology recommendations   4. Practice name and name of physician performing surgery?  Performing surgeon: Dr. Skip Estimable, MD  Requesting clearance: Honor Loh, FNP-C     5. Anesthesia type (none, local, MAC, general)? General   6. What is the office phone and fax number?   Phone: [phone]  Fax: [phone]   ATTENTION: Unable to create telephone message as per your standard workflow. Directed by [facility] providers to send requests for cardiac clearance to this pool for appropriate distribution to provider covering pre-operative clearances.   Honor Loh, MSN, APRN, FNP-C, CEN  [facility]  Peri-operative Services Nurse Practitioner  Phone: [phone]  10/31/20 9:40 AM

## 2020-10-31 NOTE — Telephone Encounter (Addendum)
Pt is agreeable to plan of care for pre op appt for clearance. Pt has been scheduled to see Lorenso Quarry, [facility] 11/03/20 @ 11:30. I will forward notes to [facility] for upcoming appt. I will send FYI to requesting office pt has appt 11/03/20. Will remove from the pre op call back pool.

## 2020-10-31 NOTE — Telephone Encounter (Signed)
   Primary Cardiologist: Ida Rogue, MD  Chart reviewed as part of pre-operative protocol coverage. Because of Mary Pacheco's past medical history and time since last visit, she will require a follow-up visit in order to better assess preoperative cardiovascular risk.  Pre-op covering staff: - Please schedule appointment and call patient to inform them. If patient already had an upcoming appointment within acceptable timeframe, please add "pre-op clearance" to the appointment notes so provider is aware. - Please contact requesting surgeon's office via preferred method (i.e, phone, fax) to inform them of need for appointment prior to surgery.  If applicable, this message will also be routed to pharmacy pool and/or primary cardiologist for input on holding anticoagulant/antiplatelet agent as requested below so that this information is available to the clearing provider at time of patient's appointment.   Tami Lin Irlene Crudup, PA  10/31/2020, 11:08 AM

## 2020-10-31 NOTE — Patient Instructions (Signed)
Your procedure is scheduled on: 11/07/20 Report to [facility]. To find out your arrival time please call [phone] between 1PM - 3PM on 11/04/20.  Remember: Instructions that are not followed completely may result in serious medical risk, up to and including death, or upon the discretion of your surgeon and anesthesiologist your surgery may need to be rescheduled.     _X__ 1. Do not eat food after midnight the night before your procedure.                 No gum chewing or hard candies. You may drink clear liquids up to 2 hours                 before you are scheduled to arrive for your surgery- DO not drink clear                 liquids within 2 hours of the start of your surgery.                 Clear Liquids include:  water, apple juice without pulp, clear carbohydrate                 drink such as Clearfast or Gatorade, Black Coffee or Tea (Do not add                 anything to coffee or tea). Diabetics water only  __X__2.  On the morning of surgery brush your teeth with toothpaste and water, you                 may rinse your mouth with mouthwash if you wish.  Do not swallow any              toothpaste of mouthwash.     _X__ 3.  No Alcohol for 24 hours before or after surgery.   _X__ 4.  Do Not Smoke or use e-cigarettes For 24 Hours Prior to Your Surgery.                 Do not use any chewable tobacco products for at least 6 hours prior to                 surgery.  ____  5.  Bring all medications with you on the day of surgery if instructed.   __X__  6.  Notify your doctor if there is any change in your medical condition      (cold, fever, infections).     Do not wear jewelry, make-up, hairpins, clips or nail polish. Do not wear lotions, powders, or perfumes.  Do not shave 48 hours prior to surgery. Men may shave face and neck. Do not bring valuables to the hospital.    [facility] is not responsible for any belongings or  valuables.  Contacts, dentures/partials or body piercings may not be worn into surgery. Bring a case for your contacts, glasses or hearing aids, a denture cup will be supplied. Leave your suitcase in the car. After surgery it may be brought to your room. For patients admitted to the hospital, discharge time is determined by your treatment team.   Patients discharged the day of surgery will not be allowed to drive home.   Please read over the following fact sheets that you were given:    Incentive Sprirometer, CHG, Ensure pre surgery drink  __X__ Take these medicines the morning of surgery with  A SIP OF WATER:    1. pantoprazole (PROTONIX) 40 MG tablet  2. SYNTHROID 50 MCG tablet  3.   4.  5.  6.  ____ Fleet Enema (as directed)   __X__ Use CHG Soap/SAGE wipes as directed  ____ Use inhalers on the day of surgery  ____ Stop metformin/Janumet/Farxiga 2 days prior to surgery    ____ Take 1/2 of usual insulin dose the night before surgery. No insulin the morning          of surgery.   ____ Stop Blood Thinners Coumadin/Plavix/Xarelto/Pleta/Pradaxa/Eliquis/Effient/Aspirin  on   Or contact your Surgeon, Cardiologist or Medical Doctor regarding  ability to stop your blood thinners  __X__ Stop Anti-inflammatories 7 days before surgery such as Advil, Ibuprofen, Motrin,  BC or Goodies Powder, Naprosyn, Naproxen, Aleve, Aspirin    __X__ Stop all herbal supplements, fish oil or vitamin E until after surgery.  Omega fatty acid, Cardio Health, Green Tea, Cranberry, Prevagen  ____ Bring C-Pap to the hospital.

## 2020-11-03 ENCOUNTER — Encounter: Payer: Self-pay | Admitting: Orthopedic Surgery

## 2020-11-03 ENCOUNTER — Other Ambulatory Visit: Payer: Medicare Other

## 2020-11-03 ENCOUNTER — Ambulatory Visit: Payer: Medicare Other | Admitting: Physician Assistant

## 2020-11-03 NOTE — Progress Notes (Unsigned)
Office Visit    Patient Name: Mary Pacheco Date of Encounter: 11/04/2020  Primary Care Provider:  Burnard Hawthorne, FNP Primary Cardiologist:  Ida Rogue, MD  Chief Complaint    Chief Complaint  Patient presents with  . Pre-op Exam    Cardiac clearance for right knee surgery scheduled for 11/09/2020. Medications verbally reviewed with patient.    80 yo female with history of HTN, HLD, bilateral carotid artery dz, PAD, and here today for preoperative cardiac evaluation of R knee arthroscopy 11/09/20 with Dr. Laurena Spies, MD.   Past Medical History    Past Medical History:  Diagnosis Date  . Anemia    in the past   . Anxiety 2011   patient with anxiety will continue Alprazolam prn  . Arthritis   . Asthma    grew up with asthma, but not as an adult   . Carotid artery disease ([place])   . GERD (gastroesophageal reflux disease)    pt. with esophagea reflux. She reports great improvement with Dexilant.  [name] Hearing loss    s/p hearing aids  . 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 Hyperlipidemia    patient with HL on statins. lipids well controlled  . Hypertension   . Hypothyroidism   . PAD (peripheral artery disease) ([place])   . Pre-diabetes   . Skin cancer    Past Surgical History:  Procedure Laterality Date  . ABDOMINAL EXPLORATION SURGERY  1972  . ABDOMINAL HYSTERECTOMY  1993   multiple fibroid cysts  . cataract surgery  02/2011   bilateral  . COLONOSCOPY WITH PROPOFOL N/A 04/19/2020   Procedure: COLONOSCOPY WITH PROPOFOL;  Surgeon: Virgel Manifold, MD;  Location: [facility] ENDOSCOPY;  Service: Endoscopy;  Laterality: N/A;  . EYE SURGERY    . JOINT REPLACEMENT    . 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 skin cancer removal    . TONSILLECTOMY      Allergies  Allergies  Allergen Reactions  . Molds & Smuts Other (See  Comments)    Induces asthma Induces asthma  . Rosuvastatin Hives  . Simvastatin Other (See Comments)    myalgia    History of Present Illness    Mary Pacheco is a 80 y.o. female with PMH as above.   She was last seen by her primary cardiologist, Dr. Rockey Situ, 04/18/20. She continued to have labile BP, though reported better control at home. She wondered if her BP was too low at times. She had a rash and was unclear if 2/2 Zetia. She reportedly had not tolerated Lipitor 50m well due to arm pain and was back on Lipitor 247mdaily.   Today, 11/04/2020, she returns to clinic and notes she is overall been doing well from a cardiac standpoint.  She reports that she stuck it out on Zetia and subsequently had relief of her previous reported symptoms and thus has been taking Zetia since that time.  She also continues on Lipitor 20 mg daily. No CP or shortness of breath at rest. She is able to walk a block on level ground. She can walk uphill without sx..  She completes her housework without significant symptoms or difficulty. She reports riding a stationary bike for regular activity without sx, though she does state this has not been as regular since Christmas. She reports that her knee and  should pain limits her activity, rather than CP or DOE. She only feels SOB if prolonged exertion, such as climbing several flights of steps, and usually resolving within 1-2 minutes of rest. She is hopeful to increase her activity again after her procedure.  She denies any racing heart rate or palpitations.  She reports occasional dizziness with quick head movement / position that is not new for her and avoided with slow position and head changes.  No amaurosis fugax.   BP elevated today at 170/66 with patient report that this is likely 2/2 recent walk into the office.  She also reports stress 2/2 death of a close friend. In addition, she reports stress 2/2 rescheduling her procedure 2/2 the recent weather, which resulted in  a shift of some plans.  She reports her home pressures are usually SBP 120s to 130s, and though she hasn't been checking them as often recently, she intends to start checking more regularly now.  She reports a history of occasional low pressures at home as previously documented but without associated symptoms with lower pressures. No s/sx of bleeding. She reports medication compliance.   Home Medications    Current Outpatient Medications on File Prior to Visit  Medication Sig Dispense Refill  . acetaminophen (TYLENOL) 500 MG tablet Take 1,000 mg by mouth every 6 (six) hours as needed for moderate pain.    [name] acyclovir (ZOVIRAX) 400 MG tablet TAKE 1 TABLET BY MOUTH EVERY DAY (Patient taking differently: Take 400 mg by mouth at bedtime.) 90 tablet 2  . ALPRAZolam (XANAX) 0.25 MG tablet Take 0.5 tablets (0.125 mg total) by mouth daily as needed for anxiety. 30 tablet 1  . amLODipine (NORVASC) 2.5 MG tablet Take 1 tablet (2.5 mg total) by mouth daily. May take additional dose for blood pressure > 135/85. (Patient taking differently: Take 2.5 mg by mouth at bedtime. May take additional dose for blood pressure > 135/85.) 90 tablet 3  . Apoaequorin 10 MG CAPS Take 1 tablet by mouth daily.     [name] atorvastatin (LIPITOR) 20 MG tablet TAKE 1 TABLET BY MOUTH EVERY DAY (Patient taking differently: Take 20 mg by mouth every evening.) 90 tablet 1  . BAYER ASPIRIN EC LOW DOSE 81 MG EC tablet Take 81 mg by mouth daily.     . celecoxib (CELEBREX) 200 MG capsule Take 200 mg by mouth 2 (two) times daily.    . Cholecalciferol (VITAMIN D) 125 MCG (5000 UT) CAPS Take 5,000 Units by mouth daily.    . Coenzyme Q10 200 MG capsule Take 200 mg by mouth daily.     [name] CRANBERRY PO Take 650 mg by mouth daily.    [name] ezetimibe (ZETIA) 10 MG tablet TAKE 1 TABLET DAILY (Patient taking differently: Take 10 mg by mouth every evening.) 90 tablet 2  . Green Tea, Camellia sinensis, (GREEN TEA PO) Take 500 mg by mouth daily.     .  hydrochlorothiazide (MICROZIDE) 12.5 MG capsule TAKE 1 CAPSULE DAILY (Patient taking differently: Take 12.5 mg by mouth daily.) 90 capsule 3  . magnesium oxide (MAG-OX) 400 MG tablet Take 400 mg by mouth daily.    . meloxicam (MOBIC) 7.5 MG tablet Take 1 tablet (7.5 mg total) by mouth daily as needed for pain. Take with food 90 tablet 1  . Misc Natural Products (TOTAL CARDIO HEALTH FORMULA PO) Take 1 tablet by mouth daily.    . Omega-3 Fatty Acids (FISH OIL TRIPLE STRENGTH) 1400 MG CAPS Take 1,400 mg  by mouth daily.     . pantoprazole (PROTONIX) 40 MG tablet TAKE 1 TABLET DAILY (Patient taking differently: Take 40 mg by mouth every other day. TAKE 1 TABLET DAILY) 90 tablet 0  . Probiotic Product (PROBIOTIC DAILY PO) Take 1 capsule by mouth daily.     [name] SYNTHROID 50 MCG tablet TAKE 1 TABLET DAILY (Patient taking differently: Take 50 mcg by mouth daily before breakfast.) 90 tablet 3   No current facility-administered medications on file prior to visit.    Review of Systems    She denies chest pain, palpitations, pnd, orthopnea, n, v, syncope, edema, weight gain, or early satiety.  She reports dyspnea with heavy or prolonged exertion, which is not new for her.  She reports some vague dizziness with rapid head movement / position, which is not new for her, and fixed by slow position and head changes.  She reports right knee pain and L shoulder pain.  She notes recent stressors with the death of a close friend and having to reschedule her surgery.  All other systems reviewed and are otherwise negative except as noted above.  Physical Exam    VS:  BP (!) 170/66 (BP Location: Left Arm, Patient Position: Sitting, Cuff Size: Normal)   Pulse 73   Ht _0  (1.6 m)   Wt 142 lb (64.4 kg)   SpO2 98%   BMI 25.15 kg/m  , BMI Body mass index is 25.15 kg/m. GEN: Well nourished, well developed, in no acute distress. HEENT: normal. Neck: Supple, no JVD, carotid bruits, or masses. Cardiac: RRR, no murmurs,  rubs, or gallops. No clubbing, cyanosis, edema.  Radials/DP/PT 2+ and equal bilaterally.  Respiratory:  Respirations regular and unlabored, clear to auscultation bilaterally. GI: Soft, nontender, nondistended, BS + x 4. MS: no deformity or atrophy. Skin: warm and dry, no rash. Neuro:  Strength and sensation are intact. Psych: Normal affect.  Accessory Clinical Findings    ECG personally reviewed by me today - NSR, 73bpm- no acute changes.  VITALS Reviewed today   Temp Readings from Last 3 Encounters:  09/05/20 98.3 F (36.8 C)  05/16/20 98 F (36.7 C)  04/19/20 (!) 97.1 F (36.2 C) (Temporal)   BP Readings from Last 3 Encounters:  11/04/20 (!) 170/66  09/05/20 (!) 144/62  05/16/20 (!) 154/66   Pulse Readings from Last 3 Encounters:  11/04/20 73  09/05/20 71  05/16/20 78    Wt Readings from Last 3 Encounters:  11/04/20 142 lb (64.4 kg)  10/31/20 142 lb (64.4 kg)  09/05/20 142 lb 12.8 oz (64.8 kg)     LABS  reviewed today   Lab Results  Component Value Date   WBC 6.2 03/25/2020   HGB 12.0 03/25/2020   HCT 36.7 03/25/2020   MCV 87.1 03/25/2020   PLT 316.0 03/25/2020   Lab Results  Component Value Date   CREATININE 0.85 09/01/2020   BUN 20 09/01/2020   NA 135 09/01/2020   K 4.0 09/01/2020   CL 97 09/01/2020   CO2 29 09/01/2020   Lab Results  Component Value Date   ALT 17 09/01/2020   AST 22 09/01/2020   ALKPHOS 84 09/01/2020   BILITOT 0.8 09/01/2020   Lab Results  Component Value Date   CHOL 143 09/01/2020   HDL 76.40 09/01/2020   LDLCALC 48 09/01/2020   TRIG 94.0 09/01/2020   CHOLHDL 2 09/01/2020    Lab Results  Component Value Date   HGBA1C 5.6 02/22/2020  Lab Results  Component Value Date   TSH 1.66 09/01/2020     STUDIES/PROCEDURES reviewed today   Carotid dz 12/28/19 Summary:  Right Carotid: Velocities in the right ICA are consistent with a 40-59%         stenosis.   Left Carotid: Velocities in the left ICA are  consistent with a 1-39%  stenosis.   Vertebrals: Bilateral vertebral arteries demonstrate antegrade flow.  Subclavians: Normal flow hemodynamics were seen in bilateral subclavian        arteries.   Assessment & Plan    Preoperative cardiac evaluation  --No active cardiac conditions or sx concerning for angina or volume overload. Euvolemic and well compensated on exam. EKG without arrhythmia or acute ST/T changes. Cardiac exam without significant murmurs or findings. No known history of ischemic heart disease or congestive heart failure. Not on insulin. Cr below 2.0. Functional capacity moderate at 4-10 METs on questioning today. Surgery specific risk low given arthroscopic procedure.   --Calculated RCRI Class I Risk, 0.4% risk of MACE.   --Reasonable to proceed with R knee arthroscopy 11/09/20 without additional preoperative testing or intervention. Recommendations for optimal BP control as directly below. Pt reports holding ASA per surgical team recommendations; therefore, recommend restart of ASA once felt safe to do so per the surgical team after the procedure.    HTN, BP suboptimal, goal BP 130/80 or lower -- BP suboptimal today, attributed to stressors as in HPI above.  Reports right knee pain and left shoulder pain.  BP reported as usually well controlled to at times low at home. No sx with lower BP. Reviewed that lower BP without sx is OK. Reviewed goal BP and acceptable BP ranges. Reviewed salt and fluid restrictions.  TSH WNL.  Discussed Mobic, given long term NSAID use can lead to elevated pressures. On review of current medications and dosing, it was discovered she has not been taking a PRN amlodipine 2.876m for elevated BP 135/85 or above. She is now aware of the recommendation for PRN amlodipine and agreeable to start taking an additional amlodipine 2.528mfor elevated BP over 135/85 going forward.  If BP consistently elevated, recommend we increase to amlodipine 76m22moing forward.  She will continue to monitor BP at home and keep us Kore[place], as well as call the office if consistently over 130/80, despite additional amlodipine.  Limit fluid to under 2L daily and salt intake to under 2g.  Continue HCTZ 12.76mg60mth current amlodipine. Most recent BMET shows stable renal function.   Bilateral carotid artery stenosis -- No reported symptoms of worsening stenosis.  Most recent carotid imaging studies as above showed right ICA consistent with 40 to 59% stenosis and left ICA consistent with 1 to 39% stenosis.  She reports upcoming studies.  Continue current ASA, statin, and Zetia.  Heart rate, BP, and glycemic control recommended.  Most recent A1c improved to 5.6. Risk factor modification discussed with heart healthy diet and ongoing activity.  Peripheral arterial disease -- As above, recommend risk factor modification with ongoing ASA, Lipitor, Zetia.  Continue to monitor lipids with goal LDL below 70.   Heart rate, BP, and glycemic control recommended. Most recent A1c improved to 5.6. Continue heart healthy diet and to increase activity as tolerated.    Hyperlipidemia, goal LDL below 70 -- Most recent labs show total cholesterol 143, LDL 48, HDL 76.4.  LDL at goal.  Continue current Zetia and Lipitor.  Regular monitoring of lipids recommended.  History of benign paroxysmal positional  vertigo/orthostatic hypotension --Previous dizziness with fast head changes or position changes as outlined above and resolved with slower position and height changes.  ------------ Right knee arthroplasty 11/09/2020 Performing surgeon: Dr. Skip Estimable, MD  Requesting clearance: Honor Loh, FNP-C   Anesthesia type (none, local, MAC, general)? General  Phone: [phone]  Fax: [phone]  ---- Honor Loh, MSN, APRN, FNP-C, CEN  [place]  Peri-operative Services Nurse Practitioner  Phone: [phone]  10/31/20 9:40 AM  ---------  Medication changes: Amlodipine  2.5 mg at bedtime for BP at or above 135/85.  If BP remains consistently elevated 130/80 or higher, recommend increase current amlodipine to 5 mg daily moving forward. Restart of ASA (held by surgery) per surgical team. Labs ordered: None Studies / Imaging ordered: None. Updated carotids scheduled.  Future considerations: Recheck BP at RTC. Disposition: RTC 6-12 months   Arvil Chaco, PA-C 11/04/2020

## 2020-11-03 NOTE — Progress Notes (Signed)
[facility] Perioperative Services  Pre-Admission/Anesthesia Testing Clinical Review  Date: 11/07/20  Patient Demographics:  Name: Mary Pacheco DOB:   10/04/1941 MRN:   [id number]  Planned Surgical Procedure(s):    Case: [id number] Date/Time: 11/09/20 1215   Procedure: ARTHROSCOPY KNEE (Right Knee) - FOLLOWING 2ND CASE   Anesthesia type: Choice   Pre-op diagnosis: Internal derangement of right knee M23.91   Location: [facility] OR ROOM 01 / [place] ORS FOR ANESTHESIA GROUP   Surgeons: Dereck Leep, MD    NOTE: Available PAT nursing documentation and vital signs have been reviewed. Clinical nursing staff has updated patient's PMH/PSHx, current medication list, and drug allergies/intolerances to ensure comprehensive history available to assist in medical decision making as it pertains to the aforementioned surgical procedure and anticipated anesthetic course.   Clinical Discussion:  Mary Pacheco is a 80 y.o. female who is submitted for pre-surgical anesthesia review and clearance prior to her undergoing the above procedure. Patient has never been a smoker. Pertinent PMH includes: PAD, carotid artery disease, HTN, HLD, prediabetes, hypothyroidism, asthma, GERD (on daily PPI), anemia, OA, anxiety (on BZO).  Patient is followed by cardiology Rockey Situ, MD). She was last seen in the cardiology clinic on 11/04/2020; notes reviewed.  At the time of her clinic visit patient doing well from a cardiovascular perspective. Patient denies chest pain, shortness of breath, PND, orthopnea, palpitations, peripheral edema, and presyncope/syncope. Patient with elevated blood pressure in the office documented at 170/66 despite prescribed CCB and diuretic therapy.  Reporting a great deal of stress related to her upcoming surgery and being rescheduled and the death of a close friend.  Patient also sided distance walked to provider's office as reason for her blood pressure being elevated.  Patient  monitoring blood pressure at home and notes that SBP running in the 120-130 range, with intermittent episodes of hypotension.  Patient advising that she will develop vertiginous symptoms associated with abrupt position changes; resolves quickly.  Bilateral carotid Doppler studies performed on 12/28/2019 revealed 40 to 59% stenosis in the RIGHT ICA and 1 to 39% stenosis in the LEFT ICA.  Remote cardiac CT performed on 11/03/2015 revealed a calcium score 5, which was the 26 percentile for age and sex matched control for this patient (see full interpretation of cardiovascular testing below).  Patient making efforts to remain active; does housework; rides a stationary bike, and climb stairs with minimal difficulty. Functional capacity, as defined by DASI, is documented as being >/= 4 METS.  Patient notes that functional capacity limited by current orthopedic pain. Patient advised to take an extra 2.5 mg of her CCB (amlodipine) for elevated blood pressure >/= 135/85, with further titration to 5 mg should this not help control her blood pressure.  No other changes were made to patient medication regimen.  Patient to follow-up with outpatient cardiology in 6 to 12 months or sooner if needed.  Patient is scheduled to undergo an elective orthopedic procedure on 11/09/2020 with Dr. Skip Estimable.  Given patient's past medical history significant for cardiovascular disease, presurgical cardiac clearance was sought by PAT team.  Per cardiology, "patient's calculated RCRI demonstrates a class I risk representing a 0.4% risk of MACE.  No active cardiac conditions or symptoms concerning for angina or volume overload.  EKG without arrhythmia or significant ST/T wave changes.  No known history of ischemic heart disease or congestive heart failure.  Functional capacity moderate at 4-10 METS on questioning.  It is reasonable for patient to proceed with  planned procedure without additional preoperative testing or intervention". She  has been instructed on recommendations for holding her low-dose ASA for 7 days prior to her procedure with plans to restart as soon as deemed safely able to do so by attending surgeon.  Patient is aware that her last dose of ASA will be on 11/01/2020.  She denies previous perioperative complications with anesthesia. She underwent a general anesthetic course here (ASA II) in 03/2020 with no documented complications.   Vitals with BMI 11/04/2020 10/31/2020 09/05/2020  Height 5\' 3"  5\' 3"  5' 3.5"  Weight 142 lbs 142 lbs 142 lbs 13 oz  BMI 25.16 [id number]  Systolic [id number] - [id number]  Diastolic 66 - 62  Pulse 73 - 71    Providers/Specialists:   NOTE: Primary physician provider listed below. Patient may have been seen by APP or partner within same practice.   PROVIDER ROLE / SPECIALTY LAST OV  Hooten, Laurice Record, MD Orthopedics (Surgeon)  08/18/2020  Burnard Hawthorne, FNP Primary Care Provider  09/05/2020  Ida Rogue, MD Cardiology  11/04/2020   Allergies:  Molds & smuts, Rosuvastatin, and Simvastatin  Current Home Medications:   No current facility-administered medications for this encounter.   [name] acetaminophen (TYLENOL) 500 MG tablet  . acyclovir (ZOVIRAX) 400 MG tablet  . ALPRAZolam (XANAX) 0.25 MG tablet  . amLODipine (NORVASC) 2.5 MG tablet  . Apoaequorin 10 MG CAPS  . atorvastatin (LIPITOR) 20 MG tablet  . BAYER ASPIRIN EC LOW DOSE 81 MG EC tablet  . Cholecalciferol (VITAMIN D) 125 MCG (5000 UT) CAPS  . Coenzyme Q10 200 MG capsule  . CRANBERRY PO  . ezetimibe (ZETIA) 10 MG tablet  . Green Tea, Camellia sinensis, (GREEN TEA PO)  . hydrochlorothiazide (MICROZIDE) 12.5 MG capsule  . magnesium oxide (MAG-OX) 400 MG tablet  . meloxicam (MOBIC) 7.5 MG tablet  . Misc Natural Products (TOTAL CARDIO HEALTH FORMULA PO)  . Omega-3 Fatty Acids (FISH OIL TRIPLE STRENGTH) 1400 MG CAPS  . pantoprazole (PROTONIX) 40 MG tablet  . Probiotic Product (PROBIOTIC DAILY PO)  . SYNTHROID 50 MCG  tablet  . celecoxib (CELEBREX) 200 MG capsule   History:   Past Medical History:  Diagnosis Date  . Anemia    in the past   . Anxiety 2011   patient with anxiety will continue Alprazolam prn  . Arthritis   . Asthma    grew up with asthma, but not as an adult   . Carotid artery disease ([place])   . GERD (gastroesophageal reflux disease)    pt. with esophagea reflux. She reports great improvement with Dexilant.  [name] Hearing loss    s/p hearing aids  . 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 Hyperlipidemia    patient with HL on statins. lipids well controlled  . Hypertension   . Hypothyroidism   . PAD (peripheral artery disease) ([place])   . Pre-diabetes   . Skin cancer    Past Surgical History:  Procedure Laterality Date  . ABDOMINAL EXPLORATION SURGERY  1972  . ABDOMINAL HYSTERECTOMY  1993   multiple fibroid cysts  . cataract surgery  02/2011   bilateral  . COLONOSCOPY WITH PROPOFOL N/A 04/19/2020   Procedure: COLONOSCOPY WITH PROPOFOL;  Surgeon: Virgel Manifold, MD;  Location: [facility] ENDOSCOPY;  Service: Endoscopy;  Laterality: N/A;  . EYE SURGERY    . JOINT REPLACEMENT    . KNEE ARTHROPLASTY Left 08/19/2019   Procedure: COMPUTER ASSISTED TOTAL KNEE ARTHROPLASTY;  Surgeon: Skip Estimable  P, MD;  Location: [facility] ORS;  Service: Orthopedics;  Laterality: Left;  . skin cancer removal    . TONSILLECTOMY     Family History  Problem Relation Age of Onset  . Heart failure Mother   . Heart disease Mother        CABG  . Hypertension Mother   . Hyperlipidemia Mother   . Stroke Mother        mini strokes, multiple  . Heart attack Father   . Heart failure Father   . Hyperlipidemia Father   . Hypertension Father   . Heart attack Sister 12       MI  . Hernia Sister   . Prostate cancer Brother    Social History   Tobacco Use  . Smoking status: Never Smoker  . Smokeless tobacco: Never Used  Vaping Use  . Vaping Use: Never used  Substance Use Topics  . Alcohol use: Yes    Comment:  socially  . Drug use: No    Pertinent Clinical Results:  LABS: Labs reviewed: Acceptable for surgery.  No visits with results within 3 Day(s) from this visit.  Latest known visit with results is:  Lab on 09/01/2020  Component Date Value Ref Range Status  . TSH 09/01/2020 1.66  0.35 - 4.50 uIU/mL Final  . Cholesterol 09/01/2020 143  0 - 200 mg/dL Final   ATP III Classification       Desirable:  < 200 mg/dL               Borderline High:  200 - 239 mg/dL          High:  > = 240 mg/dL  . Triglycerides 09/01/2020 94.0  0.0 - 149.0 mg/dL Final   Normal:  <150 mg/dLBorderline High:  150 - 199 mg/dL  . HDL 09/01/2020 76.40  >39.00 mg/dL Final  . VLDL 09/01/2020 18.8  0.0 - 40.0 mg/dL Final  . LDL Cholesterol 09/01/2020 48  0 - 99 mg/dL Final  . Total CHOL/HDL Ratio 09/01/2020 2   Final                  Men          Women1/2 Average Risk     3.4          3.3Average Risk          5.0          4.42X Average Risk          9.6          7.13X Average Risk          15.0          11.0                      . NonHDL 09/01/2020 66.39   Final   NOTE:  Non-HDL goal should be 30 mg/dL higher than patient's LDL goal (i.e. LDL goal of < 70 mg/dL, would have non-HDL goal of < 100 mg/dL)  . Sodium 09/01/2020 135  135 - 145 mEq/L Final  . Potassium 09/01/2020 4.0  3.5 - 5.1 mEq/L Final  . Chloride 09/01/2020 97  96 - 112 mEq/L Final  . CO2 09/01/2020 29  19 - 32 mEq/L Final  . Glucose, Bld 09/01/2020 100* 70 - 99 mg/dL Final  . BUN 09/01/2020 20  6 - 23 mg/dL Final  . Creatinine, Ser 09/01/2020 0.85  0.40 - 1.20 mg/dL Final  .  Total Bilirubin 09/01/2020 0.8  0.2 - 1.2 mg/dL Final  . Alkaline Phosphatase 09/01/2020 84  39 - 117 U/L Final  . AST 09/01/2020 22  0 - 37 U/L Final  . ALT 09/01/2020 17  0 - 35 U/L Final  . Total Protein 09/01/2020 7.3  6.0 - 8.3 g/dL Final  . Albumin 09/01/2020 5.0  3.5 - 5.2 g/dL Final  . GFR 09/01/2020 64.85  >60.00 mL/min Final   Calculated using the CKD-EPI Creatinine  Equation (2021)  . Calcium 09/01/2020 10.1  8.4 - 10.5 mg/dL Final    ECG: Date: 04/18/2020 Time ECG obtained: 1336 PM Rate: 63 bpm Rhythm: normal sinus Axis (leads I and aVF): Normal Intervals: PR 170 ms. QRS 86 ms. QTc 425 ms. ST segment and T wave changes: No evidence of acute ST segment elevation or depression Comparison: Similar to previous tracing obtained on 10/12/2019   IMAGING / PROCEDURES: MRI KNEE RIGHT WO CONTRAST performed on 04/15/2020 1. Multiple tears of the lateral meniscus  Peripheral longitudinal tear of the anterior horn  Complex undersurface tear of the posterior horn  Radial tear of the mid body  Borderline extrusion of the body 2. 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 Moderate joint effusion with synovitis  BILATERAL CAROTID DUPLEX performed on 12/28/2019 1. Right Carotid: Velocities in the right ICA are consistent with a 40-59% stenosis.  2. Left Carotid: Velocities in the left ICA are consistent with a 1-39% stenosis.  3. Vertebrals:  Bilateral vertebral arteries demonstrate antegrade flow.  4. Subclavians: Normal flow hemodynamics were seen in bilateral subclavian arteries.   CARDIAC CT performed on 11/03/2015 1. Ascending aorta measures 3.1 cm 2. Pericardium normal 3. 2 small areas of punctate calcium in the proximal and mid LAD 4. Coronary calcium score of 5, which is in the 26 percentile for age and sex matched control for this patient. 5. Mild hepatic steatosis  Impression and Plan:  ARALI SOMERA has been referred for pre-anesthesia review and clearance prior to her undergoing the planned anesthetic and procedural courses. Available labs, pertinent testing, and imaging results were personally reviewed by me. This patient has been appropriately cleared by cardiology with an overall ACCEPTABLE risk for significant perioperative cardiovascular complications..   Based on clinical review  performed today (11/07/20), barring any significant acute changes in the patient's overall condition, it is anticipated that she will be able to proceed with the planned surgical intervention. Any acute changes in clinical condition may necessitate her procedure being postponed and/or cancelled. Pre-surgical instructions were reviewed with the patient during her PAT appointment and questions were fielded by PAT clinical staff.  Honor Loh, MSN, APRN, FNP-C, CEN [facility]  Peri-operative Services Nurse Practitioner Phone: [phone] 11/07/20 9:03 AM  NOTE: This note has been prepared using Dragon dictation software. Despite my best ability to proofread, there is always the potential that unintentional transcriptional errors may still occur from this process.

## 2020-11-04 ENCOUNTER — Ambulatory Visit: Payer: Medicare Other | Admitting: Physician Assistant

## 2020-11-04 ENCOUNTER — Encounter: Payer: Self-pay | Admitting: Physician Assistant

## 2020-11-04 ENCOUNTER — Other Ambulatory Visit: Payer: Self-pay

## 2020-11-04 VITALS — BP 170/66 | HR 73 | Ht 63.0 in | Wt 142.0 lb

## 2020-11-04 DIAGNOSIS — I739 Peripheral vascular disease, unspecified: Secondary | ICD-10-CM

## 2020-11-04 DIAGNOSIS — I6523 Occlusion and stenosis of bilateral carotid arteries: Secondary | ICD-10-CM | POA: Diagnosis not present

## 2020-11-04 DIAGNOSIS — Z01810 Encounter for preprocedural cardiovascular examination: Secondary | ICD-10-CM

## 2020-11-04 DIAGNOSIS — E785 Hyperlipidemia, unspecified: Secondary | ICD-10-CM | POA: Diagnosis not present

## 2020-11-04 DIAGNOSIS — H8110 Benign paroxysmal vertigo, unspecified ear: Secondary | ICD-10-CM

## 2020-11-04 DIAGNOSIS — I10 Essential (primary) hypertension: Secondary | ICD-10-CM

## 2020-11-04 NOTE — Patient Instructions (Signed)
Medication Instructions:  Your physician recommends that you continue on your current medications as directed. Please refer to the Current Medication list given to you today.  *If you need a refill on your cardiac medications before your next appointment, please call your pharmacy*  Follow-Up: At [facility], you and your health needs are our priority.  As part of our continuing mission to provide you with exceptional heart care, we have created designated Provider Care Teams.  These Care Teams include your primary Cardiologist (physician) and Advanced Practice Providers (APPs -  Physician Assistants and Nurse Practitioners) who all work together to provide you with the care you need, when you need it.  We recommend signing up for the patient portal called "MyChart".  Sign up information is provided on this After Visit Summary.  MyChart is used to connect with patients for Virtual Visits (Telemedicine).  Patients are able to view lab/test results, encounter notes, upcoming appointments, etc.  Non-urgent messages can be sent to your provider as well.   To learn more about what you can do with MyChart, go to [email or web address].    Your next appointment:   6-12 month(s)  The format for your next appointment:   In Person  Provider:   You may see Ida Rogue, MD or one of the following Advanced Practice Providers on your designated Care Team:    Murray Hodgkins, NP  Christell Faith, PA-C  Marrianne Mood, PA-C  Cadence Alpine, [place]  Laurann Montana, NP

## 2020-11-07 ENCOUNTER — Other Ambulatory Visit
Admission: RE | Admit: 2020-11-07 | Discharge: 2020-11-07 | Disposition: A | Discharge status: Home / Self Care | Payer: Medicare Other | Source: Ambulatory Visit | Attending: Orthopedic Surgery | Admitting: Orthopedic Surgery

## 2020-11-07 ENCOUNTER — Other Ambulatory Visit: Payer: Self-pay

## 2020-11-07 DIAGNOSIS — Z01812 Encounter for preprocedural laboratory examination: Secondary | ICD-10-CM | POA: Insufficient documentation

## 2020-11-07 DIAGNOSIS — Z20822 Contact with and (suspected) exposure to covid-19: Secondary | ICD-10-CM | POA: Insufficient documentation

## 2020-11-08 ENCOUNTER — Encounter: Payer: Self-pay | Admitting: Orthopedic Surgery

## 2020-11-08 LAB — SARS CORONAVIRUS 2 (TAT 6-24 HRS): SARS Coronavirus 2: NEGATIVE

## 2020-11-08 NOTE — H&P (Signed)
ORTHOPAEDIC HISTORY & PHYSICAL Heinz Eckert, Florinda Marker., MD - 11/03/2020 3:45 PM EST Chief Complaint: Chief Complaint  Patient presents with  . Shoulder Pain  Left shoulder pain  . Pre-op Exam  H&P for right knee arthroscopy 11/09/20   Reason for Visit: The patient is a 80 y.o. female who presents today for evaluation of her left shoulder and reevaluation of her right knee. She continues to report an almost 1 year history of right knee pain without any apparent trauma or aggravating event. She localizes most of the pain along the lateral aspect of the knee. She reports some swelling, no locking, and some giving way of the knee. The pain is aggravated by lateral movements, pivoting and squatting. The patient has not appreciated any significant improvement despite activity modification, NSAIDs, and intra-articular corticosteroid injection. She states that the pain is different than the pain she experienced with the contralateral knee prior to arthroplasty.  With regard to her left shoulder, she reports a 3 month history of left shoulder pain without any apparent trauma or aggravating injury. She localizes the pain along the deltoid insertion and less so along the subacromial region. The pain is aggravated by overhead reaching and shoulder abduction. She denies any gross weakness. She denies any neck pain, numbness, or radiation of the pain down the arm. She is right-hand dominant.  Medications: Current Outpatient Medications  Medication Sig Dispense Refill  . acyclovir (ZOVIRAX) 400 MG tablet Take 1 tablet by mouth once daily  . ALPRAZolam (XANAX) 0.25 MG tablet TAKE 1 TABLET BY MOUTH 3 TIMES A DAY AS NEEDED FOR ANXIETY  . amLODIPine (NORVASC) 2.5 MG tablet Take 2.5 mg by mouth once daily  . amoxicillin (AMOXIL) 500 MG capsule TAKE 4 CAPSULES BY MOUTH 1 HOUR PRIOR TO DENTAL TREATMENT  . apoaequorin (PREVAGEN) capsule Take 1 capsule by mouth once daily  . aspirin 81 MG EC tablet Take 81 mg by  mouth once daily.  [name] atorvastatin (LIPITOR) 20 MG tablet Take 20 mg by mouth nightly  . celecoxib (CELEBREX) 200 MG capsule Take 1 capsule (200 mg total) by mouth once daily  . cholecalciferol (VITAMIN D3) 1,000 unit capsule Take 1,000 Units by mouth once daily.  . COQ10, UBIQUINOL, ORAL Take 1 capsule by mouth once daily.  . cranberry 400 mg Cap Take 1 capsule by mouth once daily.  [name] ezetimibe (ZETIA) 10 mg tablet Take 10 mg by mouth once daily  . green tea leaf extract (GREEN TEA) Cap Take 1 capsule by mouth once daily.  . hydrochlorothiazide (MICROZIDE) 12.5 mg capsule Take 1 capsule by mouth once daily  . levothyroxine (SYNTHROID, LEVOTHROID) 50 MCG tablet TAKE 1 TABLET DAILY  . omega-3 fatty acids/fish oil 340-1,000 mg capsule Take 1 capsule by mouth once daily.  . pantoprazole (PROTONIX) 40 MG DR tablet Take 40 mg by mouth every other day  . SACCHAROMYCES BOULARDII (PROBIOTIC, S.BOULARDII, ORAL) Take 1 capsule by mouth once daily   No current facility-administered medications for this visit.   Allergies: Allergies  Allergen Reactions  . Mold Shortness Of Breath  Induces asthma  . Crestor [Rosuvastatin] Hives   Past Medical History: Past Medical History:  Diagnosis Date  . Anxiety  . Arthritis  . Bilateral carotid artery stenosis  . Gastroesophageal reflux disease  . Genital herpes  . Hearing loss  . Hyperlipidemia  . Hypertension  . Thyroid disease   Past Surgical History: Past Surgical History:  Procedure Laterality Date  . CATARACT EXTRACTION Bilateral  2012  . COLONOSCOPY W/BIOPSY N/A 05/18/2014  Procedure: SCREENING COLONOSCOPY; Surgeon: Colvin Caroli, MD; Location: [facility]; Service: Gastroenterology; Laterality: N/A;  . [place]  . HYSTERECTOMY TOTAL ABDOMINAL W/REMOVAL TUBES &/OR OVARIES  1993  . Left knee arthroscopy, lateral meniscectomy, and lateral chondroplasty 03/03/2010  Dr. Marry Guan  . Left total knee  arthroplasty using computer-assisted navigation 08/19/2019  Dr Marry Guan   Social History: Social History   Socioeconomic History  . Marital status: Married  Spouse name: [name]  . Number of children: 3  . Years of education: 16  . Highest education level: Bachelor's degree (e.g., BA, AB, BS)  Occupational History  . 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 Smoking status: Never Smoker  . Smokeless tobacco: Never Used  Vaping Use  . Vaping Use: Never used  Substance and Sexual Activity  . Alcohol use: Yes  Alcohol/week: 0.0 standard drinks  Comment: Socially-couple drinks a month  . Drug use: No  . Sexual activity: Yes  Partners: Male  Other Topics Concern  . Not on file  Social History Narrative  . Not on file   Social Determinants of Health   Financial Resource Strain: Not on file  Food Insecurity: Not on file  Transportation Needs: Not on file  Physical Activity: Not on file  Stress: Not on file  Social Connections: Not on file  Housing Stability: Not on file   Family History: Family History  Problem Relation Age of Onset  . Diabetes type II Mother  . Heart disease Father  . Anesthesia problems Neg Hx   Review of Systems: A comprehensive 14 point ROS was performed, reviewed, and the pertinent orthopaedic findings are documented in the HPI.  Exam BP 140/60  Temp 36.7 C (98.1 F)  Ht 162.6 cm (5\' 4" )  Wt 63.4 kg (139 lb 12.8 oz)  LMP (LMP Unknown) Comment: Hysterectomy  BMI 24.00 kg/m   General:  Well-developed, well-nourished female seen in no acute distress.  Even heel to toe gait.  No varus or valgus thrust to the right knee.  HEENT:  Atraumatic, normocephalic. Pupils are equal and reactive to light. Extraocular motion is intact. Sclera are clear. Oropharynx is clear with moist mucosa.  Lungs:  Clear to auscultation bilaterally.  Cardiovascular: Regular rate and rhythm. Normal S1, S2. No murmur . No appreciable gallops or rubs.  Peripheral pulses are palpable. No lower extremity edema. Homan`s test is negative.   Extremities: Good strength, stability, and range of motion of the upper extremities. Good range of motion of the hips and ankles.  Left Shoulder: Shoulder contour: Normal at rest. Good scapulothoracic motion. Tenderness: Subacromial tenderness and tenderness at the deltoid insertion. Impingement test: positive Apprehension sign: negative Crepitance: negative Atrophy: No atrophy. Good strength with both internal and external rotation of the shoulder against resistance. Sulcus sign: negative Liftoff test: Performed without difficulty Range of Motion: Good active and passive range of motion of the shoulder  Right Knee:  Soft tissue swelling: mild Effusion: minimal Erythema: none Crepitance: mild Tenderness: lateral Alignment: normal Mediolateral laxity: stable Anterior drawer test:negative Lachman`s test: negative McMurray`s test: positive Atrophy: No significant atrophy.  Quadriceps tone was fair to good. Range of Motion: 0/4/120 degrees  Neurologic:  Awake, alert, and oriented.  Sensory function is intact to pinprick and light touch.  Motor strength is judged to be 5/5.  Motor coordination is within normal limits.  No apparent clonus. No tremor.   Radiographs: I  ordered and interpreted AP and transscapular views of the left shoulder that were obtained in the office today. Good preservation of the glenohumeral cartilage space. Degenerative changes to the acromioclavicular joint are noted. No significant proximal migration of the humeral head, although there appears to be some impingement of the greater tuberosity on the undersurface of the acromion (with the shoulder abducted on the AP view). No significant heterotopic calcification.   MRI: I again reviewed the right knee MRI from [facility] dated 04/16/2020. I concur with the radiologist's interpretation as  below:  MRI OF THE RIGHT KNEE WITHOUT CONTRAST   TECHNIQUE:  Multiplanar, multisequence MR imaging of the knee was performed. No  intravenous contrast was administered.   COMPARISON: None.   FINDINGS:  MENISCI   Medial meniscus: Intact. Borderline extrusion of the body.   Lateral meniscus: Peripheral longitudinal tear of the anterior horn.  Complex undersurface tear of the posterior horn. Radial tear of the  mid body. Borderline extrusion of the body   LIGAMENTS   Cruciates: Intact ACL and PCL.   Collaterals: Medial collateral ligament is intact. Lateral  collateral ligament complex is intact.   CARTILAGE   Patellofemoral: High-grade partial and full-thickness cartilage loss  over the majority of the patella. Diffuse cartilage thinning over  the trochlea.   Medial: Full-thickness fissuring over the peripheral medial femoral  condyle.   Lateral: Extensive full-thickness cartilage loss over the posterior  weight-bearing lateral femoral condyle and lateral tibial plateau  with subchondral marrow edema and cystic change.   Joint: Moderate joint effusion with synovitis. Normal Hoffa's fat.  Mild thickening of the suprapatellar plica.   Popliteal Fossa: No Baker cyst. Intact popliteus tendon.   Extensor Mechanism: Intact quadriceps tendon and patellar tendon.  Intact medial and lateral patellar retinaculum. Intact MPFL.   Bones: No acute fracture or dislocation. Mild flattening of the  posterior lateral tibial plateau. No suspicious bone lesion.   Other: None.   IMPRESSION:  1. Multiple tears of the lateral meniscus as described above.  2. 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  3. Moderate jointeffusion with synovitis.   Electronically Signed  By: Titus Dubin M.D.  On: 04/16/2020 15:19  Impression: Internal derangement of the right knee Left shoulder impingement syndrome PAD (peripheral artery disease)    Plan:  The findings were discussed in detail with the patient. The patient was given informational material on knee arthroscopy. Conservative treatment options were reviewed with the patient. We discussed the risks and benefits of surgical intervention. The usual perioperative course was also discussed in detail. Arthroscopy is an appropriate treatment for the meniscal pathology, but would have limited or no effect on degenerative changes of the articular cartilage. The patient expressed understanding of the risks and benefits of surgical intervention and would like to proceed with plans for right knee arthroscopy now that we have authorization from her insurance carrier.  With regard to the left shoulder, we discussed conservative treatment options. She was instructed to avoid positions of impingement. I recommended use of cold therapy and topical NSAIDs to the affected areas. She was instructed on rotator cuff strengthening exercises. The option of physical therapy was also discussed.  PAD (peripheral artery disease) is stable per patient report and review of record. Followed by specialist. Continue follow-up as recommended.   MEDICAL CLEARANCE: Per anesthesiology. ACTIVITIES:  Avoid pivoting, squatting, or twisting. WORK STATUS: Not applicable. THERAPY: Quadriceps strengthening exercises. MEDICATIONS: No new medications. FOLLOW-UP: Return for postoperative  follow-up.   Shawndale Kilpatrick P. Holley Bouche., M.D.  This note was generated in part with voice recognition software and I apologize for any typographical errors that were not detected and corrected.   Electronically signed by Lamar Benes., MD at 11/06/2020 7:32 AM EST

## 2020-11-09 ENCOUNTER — Ambulatory Visit
Admission: RE | Admit: 2020-11-09 | Discharge: 2020-11-09 | Disposition: A | Discharge status: Home / Self Care | Payer: Medicare Other | Attending: Orthopedic Surgery | Admitting: Orthopedic Surgery

## 2020-11-09 ENCOUNTER — Ambulatory Visit: Payer: Medicare Other | Admitting: Urgent Care

## 2020-11-09 ENCOUNTER — Encounter: Payer: Self-pay | Admitting: Orthopedic Surgery

## 2020-11-09 ENCOUNTER — Encounter
Admission: RE | Disposition: A | Discharge status: Home / Self Care | Payer: Self-pay | Source: Home / Self Care | Attending: Orthopedic Surgery

## 2020-11-09 ENCOUNTER — Other Ambulatory Visit: Payer: Self-pay

## 2020-11-09 DIAGNOSIS — M94261 Chondromalacia, right knee: Secondary | ICD-10-CM | POA: Insufficient documentation

## 2020-11-09 DIAGNOSIS — Z79899 Other long term (current) drug therapy: Secondary | ICD-10-CM | POA: Diagnosis not present

## 2020-11-09 DIAGNOSIS — M23251 Derangement of posterior horn of lateral meniscus due to old tear or injury, right knee: Secondary | ICD-10-CM | POA: Insufficient documentation

## 2020-11-09 DIAGNOSIS — Z7982 Long term (current) use of aspirin: Secondary | ICD-10-CM | POA: Diagnosis not present

## 2020-11-09 DIAGNOSIS — I739 Peripheral vascular disease, unspecified: Secondary | ICD-10-CM | POA: Diagnosis not present

## 2020-11-09 DIAGNOSIS — M7542 Impingement syndrome of left shoulder: Secondary | ICD-10-CM | POA: Diagnosis not present

## 2020-11-09 DIAGNOSIS — M23241 Derangement of anterior horn of lateral meniscus due to old tear or injury, right knee: Secondary | ICD-10-CM | POA: Diagnosis not present

## 2020-11-09 DIAGNOSIS — Z79890 Hormone replacement therapy (postmenopausal): Secondary | ICD-10-CM | POA: Diagnosis not present

## 2020-11-09 DIAGNOSIS — Z791 Long term (current) use of non-steroidal anti-inflammatories (NSAID): Secondary | ICD-10-CM | POA: Insufficient documentation

## 2020-11-09 DIAGNOSIS — Z888 Allergy status to other drugs, medicaments and biological substances status: Secondary | ICD-10-CM | POA: Diagnosis not present

## 2020-11-09 DIAGNOSIS — Z9889 Other specified postprocedural states: Secondary | ICD-10-CM

## 2020-11-09 DIAGNOSIS — M2391 Unspecified internal derangement of right knee: Secondary | ICD-10-CM | POA: Diagnosis present

## 2020-11-09 HISTORY — DX: Disorder of arteries and arterioles, unspecified: I77.9

## 2020-11-09 HISTORY — PX: KNEE ARTHROSCOPY: SHX127

## 2020-11-09 HISTORY — DX: Peripheral vascular disease, unspecified: I73.9

## 2020-11-09 SURGERY — ARTHROSCOPY, KNEE
Anesthesia: General | Site: Knee | Laterality: Right

## 2020-11-09 MED ORDER — EPHEDRINE SULFATE 50 MG/ML IJ SOLN
INTRAMUSCULAR | Status: DC | PRN
  Administered 2020-11-09: 10 mg via INTRAVENOUS

## 2020-11-09 MED ORDER — BUPIVACAINE-EPINEPHRINE 0.25% -1:200000 IJ SOLN
INTRAMUSCULAR | Status: DC | PRN
  Administered 2020-11-09: 25 mL
  Administered 2020-11-09: 5 mL

## 2020-11-09 MED ORDER — PROPOFOL 10 MG/ML IV BOLUS
INTRAVENOUS | Status: AC
  Filled 2020-11-09: qty 20

## 2020-11-09 MED ORDER — ONDANSETRON HCL 4 MG PO TABS: 4.0000 mg | ORAL_TABLET | Freq: Four times a day (QID) | ORAL | Status: DC | PRN

## 2020-11-09 MED ORDER — FENTANYL CITRATE (PF) 100 MCG/2ML IJ SOLN
INTRAMUSCULAR | Status: AC
  Filled 2020-11-09: qty 2

## 2020-11-09 MED ORDER — CELECOXIB 200 MG PO CAPS: 400.0000 mg | ORAL_CAPSULE | Freq: Once | ORAL | Status: AC

## 2020-11-09 MED ORDER — ACETAMINOPHEN 325 MG PO TABS
325.0000 mg | ORAL_TABLET | Freq: Four times a day (QID) | ORAL | Status: DC | PRN
Start: 2020-11-10 — End: 2020-11-09

## 2020-11-09 MED ORDER — LACTATED RINGERS IV SOLN: INTRAVENOUS | Status: DC | PRN

## 2020-11-09 MED ORDER — CELECOXIB 200 MG PO CAPS
ORAL_CAPSULE | ORAL | Status: AC
  Administered 2020-11-09: 400 mg via ORAL
  Filled 2020-11-09: qty 2

## 2020-11-09 MED ORDER — OXYCODONE HCL 5 MG/5ML PO SOLN: 5.0000 mg | Freq: Once | ORAL | Status: DC | PRN

## 2020-11-09 MED ORDER — CHLORHEXIDINE GLUCONATE 0.12 % MT SOLN: 15.0000 mL | Freq: Once | OROMUCOSAL | Status: DC

## 2020-11-09 MED ORDER — ACETAMINOPHEN 10 MG/ML IV SOLN
INTRAVENOUS | Status: DC | PRN
  Administered 2020-11-09: 1000 mg via INTRAVENOUS

## 2020-11-09 MED ORDER — FENTANYL CITRATE (PF) 100 MCG/2ML IJ SOLN: 25.0000 ug | INTRAMUSCULAR | Status: DC | PRN

## 2020-11-09 MED ORDER — HYDROCODONE-ACETAMINOPHEN 5-325 MG PO TABS: 1.0000 | ORAL_TABLET | ORAL | Status: DC | PRN

## 2020-11-09 MED ORDER — ONDANSETRON HCL 4 MG/2ML IJ SOLN
INTRAMUSCULAR | Status: DC | PRN
  Administered 2020-11-09: 4 mg via INTRAVENOUS

## 2020-11-09 MED ORDER — CHLORHEXIDINE GLUCONATE 0.12 % MT SOLN
OROMUCOSAL | Status: AC
  Filled 2020-11-09: qty 15

## 2020-11-09 MED ORDER — OXYCODONE HCL 5 MG PO TABS: 5.0000 mg | ORAL_TABLET | Freq: Once | ORAL | Status: DC | PRN

## 2020-11-09 MED ORDER — CEFAZOLIN SODIUM-DEXTROSE 2-4 GM/100ML-% IV SOLN
2.0000 g | INTRAVENOUS | Status: AC
  Administered 2020-11-09: 2 g via INTRAVENOUS

## 2020-11-09 MED ORDER — PROPOFOL 10 MG/ML IV BOLUS
INTRAVENOUS | Status: DC | PRN
  Administered 2020-11-09: 100 mg via INTRAVENOUS

## 2020-11-09 MED ORDER — FENTANYL CITRATE (PF) 100 MCG/2ML IJ SOLN
INTRAMUSCULAR | Status: DC | PRN
  Administered 2020-11-09: 25 ug via INTRAVENOUS
  Administered 2020-11-09: 50 ug via INTRAVENOUS

## 2020-11-09 MED ORDER — SODIUM CHLORIDE 0.9 % IV SOLN: INTRAVENOUS | Status: DC

## 2020-11-09 MED ORDER — CEFAZOLIN SODIUM-DEXTROSE 2-4 GM/100ML-% IV SOLN
INTRAVENOUS | Status: AC
  Filled 2020-11-09: qty 100

## 2020-11-09 MED ORDER — LIDOCAINE HCL (CARDIAC) PF 100 MG/5ML IV SOSY
PREFILLED_SYRINGE | INTRAVENOUS | Status: DC | PRN
  Administered 2020-11-09: 80 mg via INTRAVENOUS

## 2020-11-09 MED ORDER — ACETAMINOPHEN 10 MG/ML IV SOLN
INTRAVENOUS | Status: AC
  Filled 2020-11-09: qty 100

## 2020-11-09 MED ORDER — ONDANSETRON HCL 4 MG/2ML IJ SOLN: 4.0000 mg | Freq: Four times a day (QID) | INTRAMUSCULAR | Status: DC | PRN

## 2020-11-09 MED ORDER — MORPHINE SULFATE (PF) 2 MG/ML IV SOLN: 0.5000 mg | INTRAVENOUS | Status: DC | PRN

## 2020-11-09 MED ORDER — METOCLOPRAMIDE HCL 5 MG/ML IJ SOLN: 5.0000 mg | Freq: Three times a day (TID) | INTRAMUSCULAR | Status: DC | PRN

## 2020-11-09 MED ORDER — LACTATED RINGERS IV SOLN
INTRAVENOUS | Status: DC
  Administered 2020-11-09: 10 mL/h via INTRAVENOUS

## 2020-11-09 MED ORDER — MIDAZOLAM HCL 2 MG/2ML IJ SOLN
INTRAMUSCULAR | Status: AC
  Filled 2020-11-09: qty 2

## 2020-11-09 MED ORDER — METOCLOPRAMIDE HCL 10 MG PO TABS: 5.0000 mg | ORAL_TABLET | Freq: Three times a day (TID) | ORAL | Status: DC | PRN

## 2020-11-09 MED ORDER — DEXAMETHASONE SODIUM PHOSPHATE 10 MG/ML IJ SOLN
INTRAMUSCULAR | Status: DC | PRN
  Administered 2020-11-09: 10 mg via INTRAVENOUS

## 2020-11-09 MED ORDER — LIDOCAINE HCL (PF) 2 % IJ SOLN
INTRAMUSCULAR | Status: AC
  Filled 2020-11-09: qty 5

## 2020-11-09 MED ORDER — ORAL CARE MOUTH RINSE: 15.0000 mL | Freq: Once | OROMUCOSAL | Status: DC

## 2020-11-09 MED ORDER — MORPHINE SULFATE 4 MG/ML IJ SOLN
INTRAMUSCULAR | Status: DC | PRN
  Administered 2020-11-09: 4 mg via INTRAMUSCULAR

## 2020-11-09 MED ORDER — HYDROCODONE-ACETAMINOPHEN 5-325 MG PO TABS: 1.0000 | ORAL_TABLET | ORAL | 0 refills | Status: DC | PRN

## 2020-11-09 MED ORDER — HYDROCODONE-ACETAMINOPHEN 7.5-325 MG PO TABS
1.0000 | ORAL_TABLET | ORAL | Status: DC | PRN
  Filled 2020-11-09: qty 2

## 2020-11-09 SURGICAL SUPPLY — 30 items
ADAPTER IRRIG TUBE 2 SPIKE SOL (ADAPTER) ×4 IMPLANT
ADPR TBG 2 SPK PMP STRL ASCP (ADAPTER) ×2
BLADE SHAVER 4.5 DBL SERAT CV (CUTTER) IMPLANT
COVER WAND RF STERILE (DRAPES) ×2 IMPLANT
CUFF TOURN SGL QUICK 24 (TOURNIQUET CUFF)
CUFF TOURN SGL QUICK 30 (TOURNIQUET CUFF)
CUFF TRNQT CYL 24X4X16.5-23 (TOURNIQUET CUFF) IMPLANT
CUFF TRNQT CYL 30X4X21-28X (TOURNIQUET CUFF) IMPLANT
DRAPE ARTHRO LIMB 89X125 STRL (DRAPES) ×2 IMPLANT
DRSG DERMACEA 8X12 NADH (GAUZE/BANDAGES/DRESSINGS) ×2 IMPLANT
DURAPREP 26ML APPLICATOR (WOUND CARE) ×4 IMPLANT
GAUZE SPONGE 4X4 12PLY STRL (GAUZE/BANDAGES/DRESSINGS) ×2 IMPLANT
GLOVE INDICATOR 8.0 STRL GRN (GLOVE) ×2 IMPLANT
GLOVE SURG ENC TEXT LTX SZ7.5 (GLOVE) ×2 IMPLANT
GOWN STRL REUS W/ TWL LRG LVL3 (GOWN DISPOSABLE) ×2 IMPLANT
GOWN STRL REUS W/TWL LRG LVL3 (GOWN DISPOSABLE) ×4
IV LACTATED RINGER IRRG 3000ML (IV SOLUTION) ×12
IV LR IRRIG 3000ML ARTHROMATIC (IV SOLUTION) ×6 IMPLANT
KIT TURNOVER KIT A (KITS) ×2 IMPLANT
MANIFOLD NEPTUNE II (INSTRUMENTS) ×4 IMPLANT
PACK ARTHROSCOPY KNEE (MISCELLANEOUS) ×2 IMPLANT
SET TUBE SUCT SHAVER OUTFL 24K (TUBING) ×2 IMPLANT
SET TUBE TIP INTRA-ARTICULAR (MISCELLANEOUS) ×2 IMPLANT
SOL PREP PVP 2OZ (MISCELLANEOUS) ×2
SOLUTION PREP PVP 2OZ (MISCELLANEOUS) ×1 IMPLANT
SUT ETHILON 3-0 FS-10 30 BLK (SUTURE) ×2
SUTURE EHLN 3-0 FS-10 30 BLK (SUTURE) ×1 IMPLANT
TUBING ARTHRO INFLOW-ONLY STRL (TUBING) ×2 IMPLANT
WAND HAND CNTRL MULTIVAC 50 (MISCELLANEOUS) ×3 IMPLANT
WRAP KNEE W/COLD PACKS 25.5X14 (SOFTGOODS) ×2 IMPLANT

## 2020-11-09 NOTE — Discharge Instructions (Signed)
AMBULATORY SURGERY  DISCHARGE INSTRUCTIONS   1) The drugs that you were given will stay in your system until tomorrow so for the next 24 hours you should not:  A) Drive an automobile B) Make any legal decisions C) Drink any alcoholic beverage   2) You may resume regular meals tomorrow.  Today it is better to start with liquids and gradually work up to solid foods.  You may eat anything you prefer, but it is better to start with liquids, then soup and crackers, and gradually work up to solid foods.   3) Please notify your doctor immediately if you have any unusual bleeding, trouble breathing, redness and pain at the surgery site, drainage, fever, or pain not relieved by medication. 4)   5) Your post-operative visit with Dr.                                     is: Date:                        Time:    Please call to schedule your post-operative visit.  6) Additional Instructions:       Instructions after Knee Arthroscopy    James P. Hooten, [name], M.D.     Dept. of Orthopaedics & Sports Medicine  [facility]  [address]  [place], [place]  [address]   Phone: [phone]   Fax: [phone]   DIET: . Drink plenty of non-alcoholic fluids & begin a light diet. . Resume your normal diet the day after surgery.  ACTIVITY:  . You may use crutches or a walker with weight-bearing as tolerated, unless instructed otherwise. . You may wean yourself off of the walker or crutches as tolerated.  . Begin doing gentle exercises. Exercising will reduce the pain and swelling, increase motion, and prevent muscle weakness.   . Avoid strenuous activities or athletics for a minimum of 4-6 weeks after arthroscopic surgery. . Do not drive or operate any equipment until instructed.  WOUND CARE:  . Place one to two pillows under the knee the first day or two when sitting or lying.  . Continue to use the ice packs periodically to reduce pain and swelling. . The small incisions in  your knee are closed with nylon stitches. The stitches will be removed in the office. . The bulky dressing may be removed on the second day after surgery. DO NOT TOUCH THE STITCHES. Put a Band-Aid over each stitch. Do NOT use any ointments or creams on the incisions.  . You may bathe or shower after the stitches are removed at the first office visit following surgery.  MEDICATIONS: . You may resume your regular medications. . Please take the pain medication as prescribed. . Do not take pain medication on an empty stomach. . Do not drive or drink alcoholic beverages when taking pain medications.  CALL THE OFFICE FOR: . Temperature above 101 degrees . Excessive bleeding or drainage on the dressing. . Excessive swelling, coldness, or paleness of the toes. . Persistent nausea and vomiting.  FOLLOW-UP:  . You should have an appointment to return to the office in 7-10 days after surgery.      [facility] Department Directory         [email or web address]       [email or web address]          Cardiology  Appointments: [place] -   [phone] [place] - [phone]  Endocrinology  Appointments: [place] - [phone] [place] - [phone]  Gastroenterology  Appointments: [place] - [phone] [place] - [phone]        General Surgery   Appointments: [place] - [phone]  Internal Medicine/Family Medicine  Appointments: [place] - [phone] [place] - [phone] [place] - [phone]  Metabolic and Weigh Loss Surgery  Appointments: [place] - [phone]        Neurology  Appointments: [place] - [phone] [place] - [phone]  Neurosurgery  Appointments: [place] - [phone]  Obstetrics & Gynecology  Appointments: [place] - [phone] [place] - [phone]        Pediatrics  Appointments: [place] - [phone] [place] - [phone]  Physiatry  Appointments: [place]  [phone]  Physical Therapy  Appointments: [place] - [phone] [place] - [phone]        Podiatry  Appointments: [place] - [phone] [place] - [phone]  Pulmonology  Appointments: [place] - [phone]  Rheumatology  Appointments:  - [phone]         Location: [facility]  [address] , [place]  [address]  [place] Location: [facility] [address] [place], [place]  [address]  [place] Location: [facility] [address] [place], [place]  [address]    

## 2020-11-09 NOTE — Anesthesia Procedure Notes (Signed)
Procedure Name: LMA Insertion Date/Time: 11/09/2020 12:54 PM Performed by: Justus Memory, CRNA Pre-anesthesia Checklist: Patient identified, Patient being monitored, Timeout performed, Emergency Drugs available and Suction available Patient Re-evaluated:Patient Re-evaluated prior to induction Oxygen Delivery Method: Circle system utilized Preoxygenation: Pre-oxygenation with 100% oxygen Induction Type: IV induction Ventilation: Mask ventilation without difficulty LMA: LMA inserted LMA Size: 3.5 Tube type: Oral Number of attempts: 1 Placement Confirmation: positive ETCO2 and breath sounds checked- equal and bilateral Tube secured with: Tape Dental Injury: Teeth and Oropharynx as per pre-operative assessment

## 2020-11-09 NOTE — H&P (Signed)
The patient has been re-examined, and the chart reviewed, and there have been no interval changes to the documented history and physical.    The risks, benefits, and alternatives have been discussed at length. The patient expressed understanding of the risks benefits and agreed with plans for surgical intervention.  Mary Pacheco [name], [name] M.D.    

## 2020-11-09 NOTE — Anesthesia Preprocedure Evaluation (Signed)
Anesthesia Evaluation  Patient identified by MRN, date of birth, ID band Patient awake    Reviewed: Allergy & Precautions, H&P , NPO status , Patient's Chart, lab work & pertinent test results  History of Anesthesia Complications Negative for: history of anesthetic complications  Airway Mallampati: III  TM Distance: <3 FB Neck ROM: limited    Dental  (+) Chipped, Caps   Pulmonary neg shortness of breath, asthma ,    Pulmonary exam normal        Cardiovascular Exercise Tolerance: Good hypertension, (-) angina+ Peripheral Vascular Disease  (-) Past MI and (-) PND Normal cardiovascular exam     Neuro/Psych  Headaches,  Neuromuscular disease negative psych ROS   GI/Hepatic Neg liver ROS, GERD  Medicated and Controlled,  Endo/Other  Hypothyroidism   Renal/GU      Musculoskeletal  (+) Arthritis ,   Abdominal   Peds  Hematology negative hematology ROS (+)   Anesthesia Other Findings Past Medical History: No date: Anemia     Comment:  in the past  2011: Anxiety     Comment:  patient with anxiety will continue Alprazolam prn No date: Arthritis No date: Asthma     Comment:  grew up with asthma, but not as an adult  No date: Carotid artery disease ([place]) No date: GERD (gastroesophageal reflux disease)     Comment:  pt. with esophagea reflux. She reports great improvement              with Dexilant. No date: Hearing loss     Comment:  s/p hearing aids 02/20/2010: Herpes     Comment:  gential No date: Hyperlipidemia     Comment:  patient with HL on statins. lipids well controlled No date: Hypertension No date: Hypothyroidism No date: PAD (peripheral artery disease) ([facility]) No date: Pre-diabetes No date: Skin cancer     Comment:  left leg  Past Surgical History: 1972: ABDOMINAL EXPLORATION SURGERY 1993: ABDOMINAL HYSTERECTOMY     Comment:  multiple fibroid cysts 02/2011: cataract surgery     Comment:   bilateral 04/19/2020: COLONOSCOPY WITH PROPOFOL; N/A     Comment:  Procedure: COLONOSCOPY WITH PROPOFOL;  Surgeon:               Virgel Manifold, MD;  Location: [facility];                Service: Endoscopy;  Laterality: N/A; No date: EYE SURGERY No date: JOINT REPLACEMENT 08/19/2019: KNEE ARTHROPLASTY; Left     Comment:  Procedure: COMPUTER ASSISTED TOTAL KNEE ARTHROPLASTY;                Surgeon: Dereck Leep, MD;  Location: [facility] ORS;                Service: Orthopedics;  Laterality: Left; No date: skin cancer removal No date: TONSILLECTOMY  BMI    Body Mass Index: 25.15 kg/m      Reproductive/Obstetrics negative OB ROS                             Anesthesia Physical Anesthesia Plan  ASA: III  Anesthesia Plan: General LMA   Post-op Pain Management:    Induction: Intravenous  PONV Risk Score and Plan: Dexamethasone, Ondansetron, Midazolam and Treatment may vary due to age or medical condition  Airway Management Planned: LMA  Additional Equipment:   Intra-op Plan:   Post-operative Plan: Extubation in OR  Informed Consent: I have reviewed the patients History and Physical, chart, labs and discussed the procedure including the risks, benefits and alternatives for the proposed anesthesia with the patient or authorized representative who has indicated his/her understanding and acceptance.     Dental Advisory Given  Plan Discussed with: Anesthesiologist, CRNA and Surgeon  Anesthesia Plan Comments: (Patient consented for risks of anesthesia including but not limited to:  - adverse reactions to medications - damage to eyes, teeth, lips or other oral mucosa - nerve damage due to positioning  - sore throat or hoarseness - Damage to heart, brain, nerves, lungs, other parts of body or loss of life  Patient voiced understanding.)        Anesthesia Quick Evaluation

## 2020-11-09 NOTE — Op Note (Signed)
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   ASSISTANT: none  ANESTHESIA: general  ESTIMATED BLOOD LOSS: Minimal  FLUIDS REPLACED: 700 mL of crystalloid  TOURNIQUET TIME: Not used  INDICATIONS FOR SURGERY: Mary Pacheco is a 80 y.o. year old female who has been seen for complaints of right knee pain. MRI demonstrated findings consistent with meniscal pathology. After discussion of the risks and benefits of surgical intervention, the patient expressed understanding of the risks benefits and agree with plans for right knee arthroscopy.   PROCEDURE IN DETAIL: The patient was brought into the operating room and, after adequate general anesthesia was achieved, a tourniquet was applied to the right thigh and the leg was placed in the leg holder. All bony prominences were well padded. The patient's right knee was cleaned and prepped with alcohol and Duraprep and draped in the usual sterile fashion. A "timeout" was performed as per usual protocol. The anticipated portal sites were injected with 0.25% Marcaine with epinephrine. An anterolateral incision was made and a cannula was inserted. A large effusion was evacuated and the knee was distended with fluid using the pump. The scope was advanced down the medial gutter into the medial compartment. Under visualization with the scope, an anteromedial portal was created and a hooked probe was inserted. The medial meniscus was visualized and probed.   There was some minor fraying along the inner aspect but no discrete tear.  The articular cartilage was visualized.  There were grade 2-3 changes of chondromalacia following primarily the medial femoral condyle.  These areas were debrided and contoured using the 50 degree ArthroCare wand.  The scope was then advanced into the intercondylar notch. The anterior cruciate ligament was visualized and probed and felt to be intact. The scope was removed from the lateral portal and reinserted via the anteromedial portal to better visualize the lateral compartment. The lateral meniscus was visualized and probed.  There was a complex tear of the anterior horn of the lateral meniscus.  The tear was debrided using meniscal punches and a 4.5 mm incisor shaver.  This allowed for better visualization of the lateral compartment.  There was a horizontal tear involving the posterior horn of the lateral meniscus.  The tear was debrided using meniscal punches and a 4.5 mm incisor shaver.,  Contouring of the anterior and posterior horns was performed using the 50 degree ArthroCare wand.  The remaining rim of meniscus was visualized and probed and felt to be stable.  The articular cartilage of the lateral compartment was visualized.  There were some grade 2-3 changes of chondromalacia involving merrily the lateral femoral condyle.  These areas were debrided and contoured using the ArthroCare wand.  Finally, the scope was advanced so as to visualize the patellofemoral articulation. Good patellar tracking was appreciated.  Some grade II-III chondromalacia was noted to the lateral facet of the patella as well as less so to the intercondylar notch.  These areas were likewise contoured using the ArthroCare wand.  The knee  was irrigated with copius amounts of fluid and suctioned dry. The anterolateral portal was re-approximated with #3-0 nylon. A combination of 0.25% Marcaine with epinephrine and 4 mg of Morphine were injected via the  scope. The scope was removed and the anteromedial portal was re-approximated with #3-0 nylon. A sterile dressing was applied followed by application of an ice wrap.  The patient tolerated the procedure well and was transported to the PACU in stable condition.  Cathlyn Tersigni P. Holley Bouche., M.D.

## 2020-11-09 NOTE — Transfer of Care (Signed)
Immediate Anesthesia Transfer of Care Note  Patient: Mary Pacheco  Procedure(s) Performed: ARTHROSCOPY KNEE (Right Knee)  Patient Location: PACU  Anesthesia Type:General  Level of Consciousness: sedated  Airway & Oxygen Therapy: Patient Spontanous Breathing and Patient connected to face mask oxygen  Post-op Assessment: Report given to RN and Post -op Vital signs reviewed and stable  Post vital signs: Reviewed and stable  Last Vitals:  Vitals Value Taken Time  BP 158/60 11/09/20 1451  Temp    Pulse 79 11/09/20 1457  Resp 13 11/09/20 1457  SpO2 100 % 11/09/20 1457  Vitals shown include unvalidated device data.  Last Pain:  Vitals:   11/09/20 1152  TempSrc: Oral  PainSc: 4       Patients Stated Pain Goal: 1 (60/63/01 6010)  Complications: No complications documented.

## 2020-11-10 NOTE — Anesthesia Postprocedure Evaluation (Signed)
Anesthesia Post Note  Patient: Mary Pacheco  Procedure(s) Performed: ARTHROSCOPY KNEE (Right Knee)  Patient location during evaluation: PACU Anesthesia Type: General Level of consciousness: awake and alert Pain management: pain level controlled Vital Signs Assessment: post-procedure vital signs reviewed and stable Respiratory status: spontaneous breathing, nonlabored ventilation, respiratory function stable and patient connected to nasal cannula oxygen Cardiovascular status: blood pressure returned to baseline and stable Postop Assessment: no apparent nausea or vomiting Anesthetic complications: no   No complications documented.   Last Vitals:  Vitals:   11/09/20 1536 11/09/20 1602  BP: (!) 164/67 (!) 171/66  Pulse: 80 84  Resp: 17 16  Temp: 36.6 C   SpO2: 99%     Last Pain:  Vitals:   11/09/20 1536  TempSrc:   PainSc: 0-No pain                 Precious Haws Ocie Stanzione

## 2020-11-30 ENCOUNTER — Other Ambulatory Visit: Payer: Self-pay | Admitting: Family

## 2020-11-30 ENCOUNTER — Ambulatory Visit (INDEPENDENT_AMBULATORY_CARE_PROVIDER_SITE_OTHER): Payer: Medicare Other

## 2020-11-30 VITALS — BP 123/60 | HR 80 | Ht 63.0 in | Wt 141.0 lb

## 2020-11-30 DIAGNOSIS — Z0000 Encounter for general adult medical examination without abnormal findings: Secondary | ICD-10-CM

## 2020-11-30 NOTE — Progress Notes (Addendum)
Subjective:   Mary Pacheco is a 80 y.o. female who presents for Medicare Annual (Subsequent) preventive examination.  Review of Systems    No ROS.  Medicare Wellness Virtual Visit.   Cardiac Risk Factors include: advanced age (>40men, >40 women);hypertension     Objective:    Today's Vitals   11/30/20 1109  BP: 123/60  Pulse: 80  Weight: 141 lb (64 kg)  Height: 5\' 3"  (1.6 m)   Body mass index is 24.98 kg/m.  Advanced Directives 11/09/2020 10/31/2020 05/24/2020 04/19/2020 11/30/2019 08/19/2019 08/11/2019  Does Patient Have a Medical Advance Directive? Yes Yes Yes Yes Yes Yes Yes  Type of Paramedic of [place];Living will [place];Living will Living will;Healthcare Power of [place] will [place];Living will [place];Living will  Does patient want to make changes to medical advance directive? No - Patient declined No - Patient declined - - No - Patient declined No - Patient declined No - Patient declined  Copy of [place] in Chart? No - copy requested No - copy requested - - - No - copy requested No - copy requested    Current Medications (verified) Outpatient Encounter Medications as of 11/30/2020  Medication Sig  . acetaminophen (TYLENOL) 500 MG tablet Take 1,000 mg by mouth every 6 (six) hours as needed for moderate pain.  [name] acyclovir (ZOVIRAX) 400 MG tablet TAKE 1 TABLET BY MOUTH EVERY DAY (Patient taking differently: Take 400 mg by mouth at bedtime.)  . ALPRAZolam (XANAX) 0.25 MG tablet Take 0.5 tablets (0.125 mg total) by mouth daily as needed for anxiety.  [name] amLODipine (NORVASC) 2.5 MG tablet Take 1 tablet (2.5 mg total) by mouth daily. May take additional dose for blood pressure > 135/85. (Patient taking differently: Take 2.5 mg by mouth at bedtime. May take additional dose for blood pressure > 135/85.)  . Apoaequorin 10 MG CAPS Take 1 tablet by mouth daily.   [name]  atorvastatin (LIPITOR) 20 MG tablet TAKE 1 TABLET BY MOUTH EVERY DAY (Patient taking differently: Take 20 mg by mouth every evening.)  . BAYER ASPIRIN EC LOW DOSE 81 MG EC tablet Take 81 mg by mouth daily.   . celecoxib (CELEBREX) 200 MG capsule Take 200 mg by mouth 2 (two) times daily.  . Cholecalciferol (VITAMIN D) 125 MCG (5000 UT) CAPS Take 5,000 Units by mouth daily.  . Coenzyme Q10 200 MG capsule Take 200 mg by mouth daily.   [name] CRANBERRY PO Take 650 mg by mouth daily.  [name] ezetimibe (ZETIA) 10 MG tablet TAKE 1 TABLET DAILY (Patient taking differently: Take 10 mg by mouth every evening.)  . Green Tea, Camellia sinensis, (GREEN TEA PO) Take 500 mg by mouth daily.   . hydrochlorothiazide (MICROZIDE) 12.5 MG capsule TAKE 1 CAPSULE DAILY (Patient taking differently: Take 12.5 mg by mouth daily.)  . HYDROcodone-acetaminophen (NORCO) 5-325 MG tablet Take 1-2 tablets by mouth every 4 (four) hours as needed for moderate pain.  . magnesium oxide (MAG-OX) 400 MG tablet Take 400 mg by mouth daily.  . meloxicam (MOBIC) 7.5 MG tablet Take 1 tablet (7.5 mg total) by mouth daily as needed for pain. Take with food  . Misc Natural Products (TOTAL CARDIO HEALTH FORMULA PO) Take 1 tablet by mouth daily.  . Omega-3 Fatty Acids (FISH OIL TRIPLE STRENGTH) 1400 MG CAPS Take 1,400 mg by mouth daily.   . pantoprazole (PROTONIX) 40 MG tablet TAKE 1 TABLET  DAILY (Patient taking differently: Take 40 mg by mouth every other day. TAKE 1 TABLET DAILY)  . Probiotic Product (PROBIOTIC DAILY PO) Take 1 capsule by mouth daily.   [name] SYNTHROID 50 MCG tablet TAKE 1 TABLET DAILY (Patient taking differently: Take 50 mcg by mouth daily before breakfast.)   No facility-administered encounter medications on file as of 11/30/2020.    Allergies (verified) Molds & smuts, Rosuvastatin, and Simvastatin   History: Past Medical History:  Diagnosis Date  . Anemia    in the past   . Anxiety 2011   patient with anxiety will continue  Alprazolam prn  . Arthritis   . Asthma    grew up with asthma, but not as an adult   . Carotid artery disease ([place])   . GERD (gastroesophageal reflux disease)    pt. with esophagea reflux. She reports great improvement with Dexilant.  [name] Hearing loss    s/p hearing aids  . 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 Hyperlipidemia    patient with HL on statins. lipids well controlled  . Hypertension   . Hypothyroidism   . PAD (peripheral artery disease) ([place])   . Pre-diabetes   . Skin cancer    left leg   Past Surgical History:  Procedure Laterality Date  . ABDOMINAL EXPLORATION SURGERY  1972  . ABDOMINAL HYSTERECTOMY  1993   multiple fibroid cysts  . cataract surgery  02/2011   bilateral  . COLONOSCOPY WITH PROPOFOL N/A 04/19/2020   Procedure: COLONOSCOPY WITH PROPOFOL;  Surgeon: Virgel Manifold, MD;  Location: [facility] ENDOSCOPY;  Service: Endoscopy;  Laterality: N/A;  . EYE SURGERY    . JOINT REPLACEMENT    . 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 KNEE ARTHROSCOPY Right 11/09/2020   Procedure: ARTHROSCOPY KNEE;  Surgeon: Dereck Leep, MD;  Location: [facility] ORS;  Service: Orthopedics;  Laterality: Right;  FOLLOWING 2ND CASE  . skin cancer removal    . TONSILLECTOMY     Family History  Problem Relation Age of Onset  . Heart failure Mother   . Heart disease Mother        CABG  . Hypertension Mother   . Hyperlipidemia Mother   . Stroke Mother        mini strokes, multiple  . Heart attack Father   . Heart failure Father   . Hyperlipidemia Father   . Hypertension Father   . Heart attack Sister 67       MI  . Hernia Sister   . Prostate cancer Brother    Social History   Socioeconomic History  . Marital status: Married    Spouse name: [name]  . Number of children: Not on file  . Years of education: Not on file  . Highest education  level: Not on file  Occupational History  . Occupation: Pharmacist, hospital    Comment: retired  Tobacco Use  . Smoking status: Never Smoker  . Smokeless tobacco: Never Used  Vaping Use  . Vaping Use: Never used  Substance and Sexual Activity  . Alcohol use: Yes    Comment: socially  . Drug use: No  . Sexual activity: Yes    Birth control/protection: Post-menopausal  Other Topics Concern  . Not on file  Social History Narrative   From [employer]      Retired Education officer, museum  Married. Lives with husband and dog.  Patient feels safe in her home   Social Determinants of Health   Financial Resource Strain: Low Risk   . Difficulty of Paying Living Expenses: Not hard at all  Food Insecurity: No Food Insecurity  . Worried About Charity fundraiser in the Last Year: Never true  . Ran Out of Food in the Last Year: Never true  Transportation Needs: No Transportation Needs  . Lack of Transportation (Medical): No  . Lack of Transportation (Non-Medical): No  Physical Activity: Insufficiently Active  . Days of Exercise per Week: 7 days  . Minutes of Exercise per Session: 20 min  Stress: No Stress Concern Present  . Feeling of Stress : Not at all  Social Connections: Unknown  . Frequency of Communication with Friends and Family: More than three times a week  . Frequency of Social Gatherings with Friends and Family: More than three times a week  . Attends Religious Services: Not on file  . Active Member of Clubs or Organizations: Yes  . Attends Archivist Meetings: Not on file  . Marital Status: Married    Tobacco Counseling Counseling given: Not Answered   Clinical Intake:  Pre-visit preparation completed: Yes        Diabetes: No  How often do you need to have someone help you when you read instructions, pamphlets, or other written materials from your doctor or pharmacy?: 1 - Never   Interpreter Needed?: No      Activities of Daily Living In your  present state of health, do you have any difficulty performing the following activities: 11/30/2020 11/09/2020  Hearing? Y Y  Comment - has hearing aides  Vision? N N  Difficulty concentrating or making decisions? N N  Comment - -  Walking or climbing stairs? N N  Comment - -  Dressing or bathing? N N  Doing errands, shopping? N N  Preparing Food and eating ? N -  Using the Toilet? N -  In the past six months, have you accidently leaked urine? N -  Do you have problems with loss of bowel control? N -  Managing your Medications? N -  Managing your Finances? N -  Housekeeping or managing your Housekeeping? N -  Some recent data might be hidden    Patient Care Team: Burnard Hawthorne, FNP as PCP - General (Family Medicine) Rockey Situ, Kathlene November, MD as PCP - Cardiology (Cardiology) Minna Merritts, MD as Consulting Physician (Cardiology) Schnier, Dolores Lory, MD (Vascular Surgery) Margaretha Sheffield, MD (Otolaryngology)  Indicate any recent Medical Services you may have received from other than [facility] providers in the past year (date may be approximate).     Assessment:   This is a routine wellness examination for Sharda.  I connected with Tobin today by telephone and verified that I am speaking with the correct person using two identifiers. Location patient: home Location provider: work Persons participating in the virtual visit: patient, Marine scientist.    I discussed the limitations, risks, security and privacy concerns of performing an evaluation and management service by telephone and the availability of in person appointments. The patient expressed understanding and verbally consented to this telephonic visit.    Interactive audio and video telecommunications were attempted between this provider and patient, however failed, due to patient having technical difficulties OR patient did not have access to video capability.  We continued and completed visit with audio only.  Some vital signs may be  absent or patient reported.   Hearing/Vision screen  Hearing Screening   125Hz  250Hz  500Hz  1000Hz  2000Hz  3000Hz  4000Hz  6000Hz  8000Hz   Right ear:           Left ear:           Comments:  Hearing aids, bilateral  Vision Screening Comments: Followed by [facility])  Cataract extraction, bilateral   Dietary issues and exercise activities discussed: Current Exercise Habits: Home exercise routine, Type of exercise: calisthenics (Stationary bike), Intensity: Mild  Healthy diet Good water intake  Goals      Patient Stated   .  DIET - INCREASE WATER INTAKE (pt-stated)      Other   .  LIFESTYLE - DECREASE FALLS RISK      Monitor footing, slow the pace to prevent falls      Depression Screen PHQ 2/9 Scores 11/30/2020 05/16/2020 11/30/2019 06/05/2019 11/26/2018 11/25/2017 11/22/2016  PHQ - 2 Score 0 0 0 0 0 0 0    Fall Risk Fall Risk  11/30/2020 05/16/2020 02/29/2020 11/30/2019 06/05/2019  Falls in the past year? 0 1 0 0 0  Number falls in past yr: 0 0 - - 0  Injury with Fall? 0 0 - - 0  Comment - - - - -  Follow up Falls evaluation completed Falls evaluation completed Falls evaluation completed Falls evaluation completed Falls evaluation completed    [place]: Handrails in use when climbing stairs? Yes Home free of loose throw rugs in walkways, pet beds, electrical cords, etc? Yes Adequate lighting in your home to reduce risk of falls? Yes   ASSISTIVE DEVICES UTILIZED TO PREVENT FALLS: Life alert? No  Use of a cane, walker or w/c? No  Grab bars in the bathroom? Yes  Shower chair or bench in shower? Yes , available as needed Elevated toilet seat or a handicapped toilet? Yes   TIMED UP AND GO: Was the test performed? No . Virtual visit.   Cognitive Function: MMSE - Mini Mental State Exam 11/25/2017 11/22/2016 03/09/2015  Orientation to time 5 5 5   Orientation to Place 5 5 5   Registration 3 3 3   Attention/ Calculation 5 5 5   Recall 2 3 3   Language-  name 2 objects 2 2 2   Language- repeat 1 1 1   Language- follow 3 step command 3 3 3   Language- read & follow direction 1 1 1   Write a sentence 1 1 1   Copy design 1 1 1   Total score 29 30 30      6CIT Screen 11/30/2020 11/30/2019 11/26/2018  What Year? 0 points 0 points 0 points  What month? 0 points 0 points 0 points  What time? 0 points 0 points 0 points  Count back from 20 0 points - 0 points  Months in reverse 0 points 0 points 2 points  Repeat phrase - - 0 points  Total Score - - 2    Immunizations Immunization History  Administered Date(s) Administered  . Influenza Split 07/12/2011, 09/15/2012, 08/06/2014  . Influenza, High Dose Seasonal PF 07/18/2016, 07/15/2017, 08/20/2018, 08/03/2019, 08/16/2020  . Influenza-Unspecified 07/31/2013, 07/15/2017  . PFIZER(Purple Top)SARS-COV-2 Vaccination 11/04/2019, 11/26/2019, 08/22/2020  . Pneumococcal Conjugate-13 04/06/2014  . Pneumococcal Polysaccharide-23 03/20/2009  . Tdap 04/06/2014  . Zoster 07/25/2013  . Zoster Recombinat (Shingrix) 04/03/2018, 08/07/2018   Health Maintenance There are no preventive care reminders to display for this patient. Health Maintenance  Topic Date Due  . URINE MICROALBUMIN  02/28/2021  .  MAMMOGRAM  07/07/2021  . TETANUS/TDAP  04/06/2024  . INFLUENZA VACCINE  Completed  . DEXA SCAN  Completed  . COVID-19 Vaccine  Completed    Colonoscopy 2015.   Mammogram status: Completed 07/07/20. Repeat every year  Lung Cancer Screening: (Low Dose CT Chest recommended if Age 41-80 years, 30 pack-year currently smoking OR have quit w/in 15years.) does not qualify.   Vision Screening: Recommended annual ophthalmology exams for early detection of glaucoma and other disorders of the eye. Is the patient up to date with their annual eye exam?  Yes   Dental Screening: Recommended annual dental exams for proper oral hygiene.  Community Resource Referral / Chronic Care Management: CRR required this visit?  No   CCM  required this visit?  No      Plan:   Keep all routine maintenance appointments.   I have personally reviewed and noted the following in the patient's chart:   . Medical and social history . Use of alcohol, tobacco or illicit drugs  . Current medications and supplements . Functional ability and status . Nutritional status . Physical activity . Advanced directives . List of other physicians . Hospitalizations, surgeries, and ER visits in previous 12 months . Vitals . Screenings to include cognitive, depression, and falls . Referrals and appointments  In addition, I have reviewed and discussed with patient certain preventive protocols, quality metrics, and best practice recommendations. A written personalized care plan for preventive services as well as general preventive health recommendations were provided to patient via mail.    Agree with plan. Mable Paris, NP   Varney Biles, LPN   11/26/5807

## 2020-11-30 NOTE — Patient Instructions (Addendum)
Mary Pacheco , Thank you for taking time to come for your Medicare Wellness Visit. I appreciate your ongoing commitment to your health goals. Please review the following plan we discussed and let me know if I can assist you in the future.   These are the goals we discussed: Goals      Patient Stated   .  DIET - INCREASE WATER INTAKE (pt-stated)      Other   .  LIFESTYLE - DECREASE FALLS RISK      Monitor footing, slow the pace to prevent falls       This is a list of the screening recommended for you and due dates:  Health Maintenance  Topic Date Due  . Urine Protein Check  02/28/2021  . Mammogram  07/07/2021  . Tetanus Vaccine  04/06/2024  . Flu Shot  Completed  . DEXA scan (bone density measurement)  Completed  . COVID-19 Vaccine  Completed    Immunizations Immunization History  Administered Date(s) Administered  . Influenza Split 07/12/2011, 09/15/2012, 08/06/2014  . Influenza, High Dose Seasonal PF 07/18/2016, 07/15/2017, 08/20/2018, 08/03/2019, 08/16/2020  . Influenza-Unspecified 07/31/2013, 07/15/2017  . PFIZER(Purple Top)SARS-COV-2 Vaccination 11/04/2019, 11/26/2019, 08/22/2020  . Pneumococcal Conjugate-13 04/06/2014  . Pneumococcal Polysaccharide-23 03/20/2009  . Tdap 04/06/2014  . Zoster 07/25/2013  . Zoster Recombinat (Shingrix) 04/03/2018, 08/07/2018   Advanced directives: End of life planning; Advance aging; Advanced directives discussed.  Copy of current HCPOA/Living Will requested.    Conditions/risks identified: none new  Follow up in one year for your annual wellness visit.   Preventive Care 81 Years and Older, Female Preventive care refers to lifestyle choices and visits with your health care provider that can promote health and wellness. What does preventive care include?  A yearly physical exam. This is also called an annual well check.  Dental exams once or twice a year.  Routine eye exams. Ask your health care provider how often you should  have your eyes checked.  Personal lifestyle choices, including:  Daily care of your teeth and gums.  Regular physical activity.  Eating a healthy diet.  Avoiding tobacco and drug use.  Limiting alcohol use.  Practicing safe sex.  Taking low-dose aspirin every day.  Taking vitamin and mineral supplements as recommended by your health care provider. What happens during an annual well check? The services and screenings done by your health care provider during your annual well check will depend on your age, overall health, lifestyle risk factors, and family history of disease. Counseling  Your health care provider may ask you questions about your:  Alcohol use.  Tobacco use.  Drug use.  Emotional well-being.  Home and relationship well-being.  Sexual activity.  Eating habits.  History of falls.  Memory and ability to understand (cognition).  Work and work Statistician.  Reproductive health. Screening  You may have the following tests or measurements:  Height, weight, and BMI.  Blood pressure.  Lipid and cholesterol levels. These may be checked every 5 years, or more frequently if you are over 66 years old.  Skin check.  Lung cancer screening. You may have this screening every year starting at age 49 if you have a 30-pack-year history of smoking and currently smoke or have quit within the past 15 years.  Fecal occult blood test (FOBT) of the stool. You may have this test every year starting at age 67.  Flexible sigmoidoscopy or colonoscopy. You may have a sigmoidoscopy every 5 years or a colonoscopy every 10  years starting at age 46.  Hepatitis C blood test.  Hepatitis B blood test.  Sexually transmitted disease (STD) testing.  Diabetes screening. This is done by checking your blood sugar (glucose) after you have not eaten for a while (fasting). You may have this done every 1-3 years.  Bone density scan. This is done to screen for osteoporosis. You may  have this done starting at age 52.  Mammogram. This may be done every 1-2 years. Talk to your health care provider about how often you should have regular mammograms. Talk with your health care provider about your test results, treatment options, and if necessary, the need for more tests. Vaccines  Your health care provider may recommend certain vaccines, such as:  Influenza vaccine. This is recommended every year.  Tetanus, diphtheria, and acellular pertussis (Tdap, Td) vaccine. You may need a Td booster every 10 years.  Zoster vaccine. You may need this after age 82.  Pneumococcal 13-valent conjugate (PCV13) vaccine. One dose is recommended after age 15.  Pneumococcal polysaccharide (PPSV23) vaccine. One dose is recommended after age 13. Talk to your health care provider about which screenings and vaccines you need and how often you need them. This information is not intended to replace advice given to you by your health care provider. Make sure you discuss any questions you have with your health care provider. Document Released: 11/04/2015 Document Revised: 06/27/2016 Document Reviewed: 08/09/2015 Elsevier Interactive Patient Education  2017 [place] Prevention in the Home Falls can cause injuries. They can happen to people of all ages. There are many things you can do to make your home safe and to help prevent falls. What can I do on the outside of my home?  Regularly fix the edges of walkways and driveways and fix any cracks.  Remove anything that might make you trip as you walk through a door, such as a raised step or threshold.  Trim any bushes or trees on the path to your home.  Use bright outdoor lighting.  Clear any walking paths of anything that might make someone trip, such as rocks or tools.  Regularly check to see if handrails are loose or broken. Make sure that both sides of any steps have handrails.  Any raised decks and porches should have guardrails  on the edges.  Have any leaves, snow, or ice cleared regularly.  Use sand or salt on walking paths during winter.  Clean up any spills in your garage right away. This includes oil or grease spills. What can I do in the bathroom?  Use night lights.  Install grab bars by the toilet and in the tub and shower. Do not use towel bars as grab bars.  Use non-skid mats or decals in the tub or shower.  If you need to sit down in the shower, use a plastic, non-slip stool.  Keep the floor dry. Clean up any water that spills on the floor as soon as it happens.  Remove soap buildup in the tub or shower regularly.  Attach bath mats securely with double-sided non-slip rug tape.  Do not have throw rugs and other things on the floor that can make you trip. What can I do in the bedroom?  Use night lights.  Make sure that you have a light by your bed that is easy to reach.  Do not use any sheets or blankets that are too big for your bed. They should not hang down onto the floor.  Have  a firm chair that has side arms. You can use this for support while you get dressed.  Do not have throw rugs and other things on the floor that can make you trip. What can I do in the kitchen?  Clean up any spills right away.  Avoid walking on wet floors.  Keep items that you use a lot in easy-to-reach places.  If you need to reach something above you, use a strong step stool that has a grab bar.  Keep electrical cords out of the way.  Do not use floor polish or wax that makes floors slippery. If you must use wax, use non-skid floor wax.  Do not have throw rugs and other things on the floor that can make you trip. What can I do with my stairs?  Do not leave any items on the stairs.  Make sure that there are handrails on both sides of the stairs and use them. Fix handrails that are broken or loose. Make sure that handrails are as long as the stairways.  Check any carpeting to make sure that it is  firmly attached to the stairs. Fix any carpet that is loose or worn.  Avoid having throw rugs at the top or bottom of the stairs. If you do have throw rugs, attach them to the floor with carpet tape.  Make sure that you have a light switch at the top of the stairs and the bottom of the stairs. If you do not have them, ask someone to add them for you. What else can I do to help prevent falls?  Wear shoes that:  Do not have high heels.  Have rubber bottoms.  Are comfortable and fit you well.  Are closed at the toe. Do not wear sandals.  If you use a stepladder:  Make sure that it is fully opened. Do not climb a closed stepladder.  Make sure that both sides of the stepladder are locked into place.  Ask someone to hold it for you, if possible.  Clearly mark and make sure that you can see:  Any grab bars or handrails.  First and last steps.  Where the edge of each step is.  Use tools that help you move around (mobility aids) if they are needed. These include:  Canes.  Walkers.  Scooters.  Crutches.  Turn on the lights when you go into a dark area. Replace any light bulbs as soon as they burn out.  Set up your furniture so you have a clear path. Avoid moving your furniture around.  If any of your floors are uneven, fix them.  If there are any pets around you, be aware of where they are.  Review your medicines with your doctor. Some medicines can make you feel dizzy. This can increase your chance of falling. Ask your doctor what other things that you can do to help prevent falls. This information is not intended to replace advice given to you by your health care provider. Make sure you discuss any questions you have with your health care provider. Document Released: 08/04/2009 Document Revised: 03/15/2016 Document Reviewed: 11/12/2014 Elsevier Interactive Patient Education  2017 [employer].

## 2020-12-07 ENCOUNTER — Other Ambulatory Visit: Payer: Self-pay | Admitting: Family

## 2020-12-29 ENCOUNTER — Ambulatory Visit (INDEPENDENT_AMBULATORY_CARE_PROVIDER_SITE_OTHER): Payer: Medicare Other | Admitting: Vascular Surgery

## 2020-12-29 ENCOUNTER — Ambulatory Visit (INDEPENDENT_AMBULATORY_CARE_PROVIDER_SITE_OTHER): Payer: Medicare Other

## 2020-12-29 ENCOUNTER — Other Ambulatory Visit: Payer: Self-pay

## 2020-12-29 DIAGNOSIS — I6523 Occlusion and stenosis of bilateral carotid arteries: Secondary | ICD-10-CM | POA: Diagnosis not present

## 2021-01-03 ENCOUNTER — Encounter (INDEPENDENT_AMBULATORY_CARE_PROVIDER_SITE_OTHER): Payer: Self-pay | Admitting: *Deleted

## 2021-01-29 ENCOUNTER — Encounter: Payer: Self-pay | Admitting: Family

## 2021-02-20 ENCOUNTER — Other Ambulatory Visit: Payer: Self-pay | Admitting: Internal Medicine

## 2021-02-20 ENCOUNTER — Other Ambulatory Visit: Payer: Self-pay | Admitting: Family

## 2021-02-20 DIAGNOSIS — F419 Anxiety disorder, unspecified: Secondary | ICD-10-CM

## 2021-02-20 NOTE — Telephone Encounter (Signed)
RX Refill:xanax Last Seen:09-05-20 Last ordered:05-16-20

## 2021-02-21 ENCOUNTER — Telehealth: Payer: Self-pay | Admitting: Family

## 2021-02-21 DIAGNOSIS — I10 Essential (primary) hypertension: Secondary | ICD-10-CM

## 2021-02-21 NOTE — Telephone Encounter (Signed)
Call pt   I have refilled your xanax Please make f/u in next 1-2 months  I looked up patient on [place] Controlled Substances Reporting System and saw no activity that raised concern of inappropriate use.

## 2021-02-22 NOTE — Telephone Encounter (Signed)
LMTCB patient just needs an appointment.

## 2021-02-22 NOTE — Telephone Encounter (Signed)
Pt called and made an appt for follow up on 06/06. She would like to have fasting labs prior to that appt. Please place orders and cal to schedule

## 2021-02-22 NOTE — Telephone Encounter (Signed)
Okay to order labs for patient?

## 2021-02-24 NOTE — Telephone Encounter (Signed)
Call pt cmp ordered Lipid panel done 5 months ago and excellent; we do this annually

## 2021-02-24 NOTE — Telephone Encounter (Signed)
LM asking patient to call back to schedule labs a few days prior to appointment 6/6. She just needs NON fasting lab appointment.

## 2021-02-27 ENCOUNTER — Telehealth: Payer: Self-pay | Admitting: Family

## 2021-02-27 ENCOUNTER — Other Ambulatory Visit: Payer: Self-pay

## 2021-02-27 DIAGNOSIS — E782 Mixed hyperlipidemia: Secondary | ICD-10-CM

## 2021-02-27 LAB — HM DIABETES EYE EXAM

## 2021-02-27 NOTE — Telephone Encounter (Signed)
Patient called in wanted to know if she could have her cholesterol checked she went to cardio doctor they wanted her to have it checked.

## 2021-02-27 NOTE — Telephone Encounter (Signed)
LM that lipid panel was ordered. I asked that she call back if she wanted to change appointment since lab appt is scheduled for 11 & needs to be fasting.

## 2021-02-27 NOTE — Telephone Encounter (Signed)
I know patient was not due, but at her request is it okay to order?

## 2021-02-27 NOTE — Telephone Encounter (Signed)
Yes may order lipid panel

## 2021-03-02 ENCOUNTER — Telehealth: Payer: Self-pay | Admitting: Family

## 2021-03-02 NOTE — Telephone Encounter (Signed)
Patient wanted Mary Pacheco to know that she will keep her lab appointment on 03/23/21 at 11am.

## 2021-03-02 NOTE — Telephone Encounter (Signed)
Noted  

## 2021-03-07 ENCOUNTER — Other Ambulatory Visit: Payer: Self-pay | Admitting: Family

## 2021-03-08 ENCOUNTER — Other Ambulatory Visit: Payer: Self-pay | Admitting: Family

## 2021-03-22 ENCOUNTER — Other Ambulatory Visit (INDEPENDENT_AMBULATORY_CARE_PROVIDER_SITE_OTHER): Payer: Medicare Other

## 2021-03-22 ENCOUNTER — Other Ambulatory Visit: Payer: Self-pay

## 2021-03-22 DIAGNOSIS — E782 Mixed hyperlipidemia: Secondary | ICD-10-CM

## 2021-03-22 DIAGNOSIS — I10 Essential (primary) hypertension: Secondary | ICD-10-CM

## 2021-03-22 LAB — COMPREHENSIVE METABOLIC PANEL
ALT: 17 U/L (ref 0–35)
AST: 19 U/L (ref 0–37)
Albumin: 4.7 g/dL (ref 3.5–5.2)
Alkaline Phosphatase: 86 U/L (ref 39–117)
BUN: 18 mg/dL (ref 6–23)
CO2: 28 mEq/L (ref 19–32)
Calcium: 9.6 mg/dL (ref 8.4–10.5)
Chloride: 97 mEq/L (ref 96–112)
Creatinine, Ser: 0.93 mg/dL (ref 0.40–1.20)
GFR: 57.99 mL/min — ABNORMAL LOW (ref >60.00)
Glucose, Bld: 127 mg/dL — ABNORMAL HIGH (ref 70–99)
Potassium: 4 mEq/L (ref 3.5–5.1)
Sodium: 132 mEq/L — ABNORMAL LOW (ref 135–145)
Total Bilirubin: 0.7 mg/dL (ref 0.2–1.2)
Total Protein: 6.6 g/dL (ref 6.0–8.3)

## 2021-03-22 LAB — LIPID PANEL
Cholesterol: 137 mg/dL (ref 0–200)
HDL: 76.8 mg/dL (ref >39.00)
LDL Cholesterol: 46 mg/dL (ref 0–99)
NonHDL: 60.32
Total CHOL/HDL Ratio: 2
Triglycerides: 74 mg/dL (ref 0.0–149.0)
VLDL: 14.8 mg/dL (ref 0.0–40.0)

## 2021-03-23 ENCOUNTER — Other Ambulatory Visit: Payer: Medicare Other

## 2021-03-27 ENCOUNTER — Encounter: Payer: Self-pay | Admitting: Family

## 2021-03-27 ENCOUNTER — Ambulatory Visit: Payer: Medicare Other | Admitting: Family

## 2021-03-27 ENCOUNTER — Other Ambulatory Visit: Payer: Self-pay

## 2021-03-27 VITALS — BP 156/56 | HR 78 | Temp 96.8°F | Ht 63.0 in | Wt 139.4 lb

## 2021-03-27 DIAGNOSIS — I10 Essential (primary) hypertension: Secondary | ICD-10-CM | POA: Diagnosis not present

## 2021-03-27 DIAGNOSIS — E119 Type 2 diabetes mellitus without complications: Secondary | ICD-10-CM | POA: Diagnosis not present

## 2021-03-27 DIAGNOSIS — R7303 Prediabetes: Secondary | ICD-10-CM | POA: Diagnosis not present

## 2021-03-27 DIAGNOSIS — E785 Hyperlipidemia, unspecified: Secondary | ICD-10-CM

## 2021-03-27 DIAGNOSIS — F419 Anxiety disorder, unspecified: Secondary | ICD-10-CM

## 2021-03-27 LAB — POCT GLYCOSYLATED HEMOGLOBIN (HGB A1C): Hemoglobin A1C: 5.8 % — AB (ref 4.0–5.6)

## 2021-03-27 NOTE — Patient Instructions (Signed)
Very nice to see you!  Please call me with blood pressure readings from home  It is imperative that you are seen AT least twice per year for labs and monitoring. Monitor blood pressure at home and me 5-6 reading on separate days. Goal is less than 120/80, based on newest guidelines, however we certainly want to be less than 130/80;  if persistently higher, please make sooner follow up appointment so we can recheck you blood pressure and manage/ adjust medications.   Managing Your Hypertension Hypertension, also called high blood pressure, is when the force of the blood pressing against the walls of the arteries is too strong. Arteries are blood vessels that carry blood from your heart throughout your body. Hypertension forces the heart to work harder to pump blood and may cause the arteries to become narrow or stiff. Understanding blood pressure readings Your personal target blood pressure may vary depending on your medical conditions, your age, and other factors. A blood pressure reading includes a higher number over a lower number. Ideally, your blood pressure should be below 120/80. You should know that:  The first, or top, number is called the systolic pressure. It is a measure of the pressure in your arteries as your heart beats.  The second, or bottom number, is called the diastolic pressure. It is a measure of the pressure in your arteries as the heart relaxes. Blood pressure is classified into four stages. Based on your blood pressure reading, your health care provider may use the following stages to determine what type of treatment you need, if any. Systolic pressure and diastolic pressure are measured in a unit called mmHg. Normal  Systolic pressure: below 194.  Diastolic pressure: below 80. Elevated  Systolic pressure: 174-081.  Diastolic pressure: below 80. Hypertension stage 1  Systolic pressure: 448-185.  Diastolic pressure: 63-14. Hypertension stage 2  Systolic  pressure: 970 or above.  Diastolic pressure: 90 or above. How can this condition affect me? Managing your hypertension is an important responsibility. Over time, hypertension can damage the arteries and decrease blood flow to important parts of the body, including the brain, heart, and kidneys. Having untreated or uncontrolled hypertension can lead to:  A heart attack.  A stroke.  A weakened blood vessel (aneurysm).  Heart failure.  Kidney damage.  Eye damage.  Metabolic syndrome.  Memory and concentration problems.  Vascular dementia. What actions can I take to manage this condition? Hypertension can be managed by making lifestyle changes and possibly by taking medicines. Your health care provider will help you make a plan to bring your blood pressure within a normal range. Nutrition  Eat a diet that is high in fiber and potassium, and low in salt (sodium), added sugar, and fat. An example eating plan is called the Dietary Approaches to Stop Hypertension (DASH) diet. To eat this way: ? Eat plenty of fresh fruits and vegetables. Try to fill one-half of your plate at each meal with fruits and vegetables. ? Eat whole grains, such as whole-wheat pasta, brown rice, or whole-grain bread. Fill about one-fourth of your plate with whole grains. ? Eat low-fat dairy products. ? Avoid fatty cuts of meat, processed or cured meats, and poultry with skin. Fill about one-fourth of your plate with lean proteins such as fish, chicken without skin, beans, eggs, and tofu. ? Avoid pre-made and processed foods. These tend to be higher in sodium, added sugar, and fat.  Reduce your daily sodium intake. Most people with hypertension should eat less  than 1,500 mg of sodium a day.   Lifestyle  Work with your health care provider to maintain a healthy body weight or to lose weight. Ask what an ideal weight is for you.  Get at least 30 minutes of exercise that causes your heart to beat faster (aerobic  exercise) most days of the week. Activities may include walking, swimming, or biking.  Include exercise to strengthen your muscles (resistance exercise), such as weight lifting, as part of your weekly exercise routine. Try to do these types of exercises for 30 minutes at least 3 days a week.  Do not use any products that contain nicotine or tobacco, such as cigarettes, e-cigarettes, and chewing tobacco. If you need help quitting, ask your health care provider.  Control any long-term (chronic) conditions you have, such as high cholesterol or diabetes.  Identify your sources of stress and find ways to manage stress. This may include meditation, deep breathing, or making time for fun activities.   Alcohol use  Do not drink alcohol if: ? Your health care provider tells you not to drink. ? You are pregnant, may be pregnant, or are planning to become pregnant.  If you drink alcohol: ? Limit how much you use to:  0-1 drink a day for women.  0-2 drinks a day for men. ? Be aware of how much alcohol is in your drink. In the [place] one drink equals one 12 oz bottle of beer (355 mL), one 5 oz glass of wine (148 mL), or one 1 oz glass of hard liquor (44 mL). Medicines Your health care provider may prescribe medicine if lifestyle changes are not enough to get your blood pressure under control and if:  Your systolic blood pressure is 130 or higher.  Your diastolic blood pressure is 80 or higher. Take medicines only as told by your health care provider. Follow the directions carefully. Blood pressure medicines must be taken as told by your health care provider. The medicine does not work as well when you skip doses. Skipping doses also puts you at risk for problems. Monitoring Before you monitor your blood pressure:  Do not smoke, drink caffeinated beverages, or exercise within 30 minutes before taking a measurement.  Use the bathroom and empty your bladder (urinate).  Sit quietly for at least 5  minutes before taking measurements. Monitor your blood pressure at home as told by your health care provider. To do this:  Sit with your back straight and supported.  Place your feet flat on the floor. Do not cross your legs.  Support your arm on a flat surface, such as a table. Make sure your upper arm is at heart level.  Each time you measure, take two or three readings one minute apart and record the results. You may also need to have your blood pressure checked regularly by your health care provider.   General information  Talk with your health care provider about your diet, exercise habits, and other lifestyle factors that may be contributing to hypertension.  Review all the medicines you take with your health care provider because there may be side effects or interactions.  Keep all visits as told by your health care provider. Your health care provider can help you create and adjust your plan for managing your high blood pressure. Where to find more information  National Heart, Lung, and Blood Institute: https://wilson-eaton.com/  American Heart Association: www.heart.org Contact a health care provider if:  You think you are having a reaction to  medicines you have taken.  You have repeated (recurrent) headaches.  You feel dizzy.  You have swelling in your ankles.  You have trouble with your vision. Get help right away if:  You develop a severe headache or confusion.  You have unusual weakness or numbness, or you feel faint.  You have severe pain in your chest or abdomen.  You vomit repeatedly.  You have trouble breathing. These symptoms may represent a serious problem that is an emergency. Do not wait to see if the symptoms will go away. Get medical help right away. Call your local emergency services (911 in the [place] Do not drive yourself to the hospital. Summary  Hypertension is when the force of blood pumping through your arteries is too strong. If this condition is  not controlled, it may put you at risk for serious complications.  Your personal target blood pressure may vary depending on your medical conditions, your age, and other factors. For most people, a normal blood pressure is less than 120/80.  Hypertension is managed by lifestyle changes, medicines, or both.  Lifestyle changes to help manage hypertension include losing weight, eating a healthy, low-sodium diet, exercising more, stopping smoking, and limiting alcohol. This information is not intended to replace advice given to you by your health care provider. Make sure you discuss any questions you have with your health care provider. Document Revised: 11/13/2019 Document Reviewed: 09/08/2019 Elsevier Patient Education  2021 [employer].

## 2021-03-27 NOTE — Progress Notes (Signed)
Subjective:    Patient ID: Mary Pacheco, female    DOB: Jan 23, 1941, 80 y.o.   MRN: [id number]  CC: Mary Pacheco is a 80 y.o. female who presents today for follow up.   HPI: Feels well today  No complaints  Prediabetes . She is following low glycemic diet.   HTN- compliant with amlodipine 2.5mg , hctz 12.5mg .  At home 123/49.  No cp, sob  HLD- compliant with lipitor 20mg , zetia 10mg .   GAD- compliant with xanax 0.25mg  prn   mammogram is scheduled for 06/2021  HISTORY:  Past Medical History:  Diagnosis Date  . Anemia    in the past   . Anxiety 2011   patient with anxiety will continue Alprazolam prn  . Arthritis   . Asthma    grew up with asthma, but not as an adult   . Carotid artery disease (Mary Pacheco)   . GERD (gastroesophageal reflux disease)    pt. with esophagea reflux. She reports great improvement with Dexilant.  Mary Pacheco Hearing loss    s/p hearing aids  . 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 Hyperlipidemia    patient with HL on statins. lipids well controlled  . Hypertension   . Hypothyroidism   . PAD (peripheral artery disease) (Mary Pacheco)   . Pre-diabetes   . Skin cancer    left leg   Past Surgical History:  Procedure Laterality Date  . ABDOMINAL EXPLORATION SURGERY  1972  . ABDOMINAL HYSTERECTOMY  1993   multiple fibroid cysts  . cataract surgery  02/2011   bilateral  . COLONOSCOPY WITH PROPOFOL N/A 04/19/2020   Procedure: COLONOSCOPY WITH PROPOFOL;  Surgeon: Virgel Manifold, MD;  Location: [facility];  Service: Endoscopy;  Laterality: N/A;  . EYE SURGERY    . JOINT REPLACEMENT    . 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 KNEE ARTHROSCOPY Right 11/09/2020   Procedure: ARTHROSCOPY KNEE;  Surgeon: Dereck Leep, MD;  Location: [facility];  Service: Orthopedics;  Laterality: Right;  FOLLOWING 2ND CASE  . skin cancer  removal    . TONSILLECTOMY     Family History  Problem Relation Age of Onset  . Heart failure Mother   . Heart disease Mother        CABG  . Hypertension Mother   . Hyperlipidemia Mother   . Stroke Mother        mini strokes, multiple  . Heart attack Father   . Heart failure Father   . Hyperlipidemia Father   . Hypertension Father   . Heart attack Sister 70       MI  . Hernia Sister   . Prostate cancer Brother     Allergies: Molds & smuts, Rosuvastatin, and Simvastatin Current Outpatient Medications on File Prior to Visit  Medication Sig Dispense Refill  . acetaminophen (TYLENOL) 500 MG tablet Take 1,000 mg by mouth every 6 (six) hours as needed for moderate pain.    Mary Pacheco acyclovir (ZOVIRAX) 400 MG tablet TAKE 1 TABLET BY MOUTH EVERY DAY 90 tablet 2  . ALPRAZolam (XANAX) 0.25 MG tablet TAKE 1/2 TABLET (0.125 MG TOTAL) BY MOUTH DAILY AS NEEDED FOR ANXIETY. 30 tablet 1  . amLODipine (NORVASC) 2.5 MG tablet Take 1 tablet (2.5 mg total) by mouth daily. May take additional dose for blood pressure > 135/85. (Patient taking differently: Take 2.5  mg by mouth at bedtime. May take additional dose for blood pressure > 135/85.) 90 tablet 3  . Apoaequorin 10 MG CAPS Take 1 tablet by mouth daily.     Mary Pacheco atorvastatin (LIPITOR) 20 MG tablet TAKE 1 TABLET BY MOUTH EVERY DAY 90 tablet 1  . BAYER ASPIRIN EC LOW DOSE 81 MG EC tablet Take 81 mg by mouth daily.     . celecoxib (CELEBREX) 200 MG capsule TAKE 1 CAPSULE BY MOUTH TWICE A DAY 84 capsule 1  . Cholecalciferol (VITAMIN D) 125 MCG (5000 UT) CAPS Take 5,000 Units by mouth daily.    . Coenzyme Q10 200 MG capsule Take 200 mg by mouth daily.     Mary Pacheco CRANBERRY PO Take 650 mg by mouth daily.    Mary Pacheco ezetimibe (ZETIA) 10 MG tablet TAKE 1 TABLET DAILY (Patient taking differently: Take 10 mg by mouth every evening.) 90 tablet 2  . Green Tea, Camellia sinensis, (GREEN TEA PO) Take 500 mg by mouth daily.     . hydrochlorothiazide (MICROZIDE) 12.5 MG capsule TAKE  1 CAPSULE DAILY (Patient taking differently: Take 12.5 mg by mouth daily.) 90 capsule 3  . magnesium oxide (MAG-OX) 400 MG tablet Take 400 mg by mouth daily.    . Misc Natural Products (TOTAL CARDIO HEALTH FORMULA PO) Take 1 tablet by mouth daily.    . Omega-3 Fatty Acids (FISH OIL TRIPLE STRENGTH) 1400 MG CAPS Take 1,400 mg by mouth daily.     . pantoprazole (PROTONIX) 40 MG tablet TAKE 1 TABLET DAILY 90 tablet 0  . Probiotic Product (PROBIOTIC DAILY PO) Take 1 capsule by mouth daily.     Mary Pacheco SYNTHROID 50 MCG tablet TAKE 1 TABLET DAILY (Patient taking differently: Take 50 mcg by mouth daily before breakfast.) 90 tablet 3  . HYDROcodone-acetaminophen (NORCO) 5-325 MG tablet Take 1-2 tablets by mouth every 4 (four) hours as needed for moderate pain. (Patient not taking: Reported on 03/27/2021) 10 tablet 0  . meloxicam (MOBIC) 7.5 MG tablet Take 1 tablet (7.5 mg total) by mouth daily as needed for pain. Take with food (Patient not taking: Reported on 03/27/2021) 90 tablet 1   No current facility-administered medications on file prior to visit.    Social History   Tobacco Use  . Smoking status: Never Smoker  . Smokeless tobacco: Never Used  Vaping Use  . Vaping Use: Never used  Substance Use Topics  . Alcohol use: Yes    Comment: socially  . Drug use: No    Review of Systems  Constitutional: Negative for chills and fever.  Respiratory: Negative for cough.   Cardiovascular: Negative for chest pain and palpitations.  Gastrointestinal: Negative for nausea and vomiting.      Objective:    BP (!) 156/56 (BP Location: Left Arm, Patient Position: Sitting, Cuff Size: Normal)   Pulse 78   Temp (!) 96.8 F (36 C) (Temporal)   Ht 5\' 3"  (1.6 m)   Wt 139 lb 6.4 oz (63.2 kg)   SpO2 98%   BMI 24.69 kg/m  BP Readings from Last 3 Encounters:  03/27/21 (!) 156/56  11/30/20 123/60  11/09/20 (!) 171/66   Wt Readings from Last 3 Encounters:  03/27/21 139 lb 6.4 oz (63.2 kg)  11/30/20 141 lb (64  kg)  11/09/20 141 lb 15.6 oz (64.4 kg)    Physical Exam Vitals reviewed.  Constitutional:      Appearance: She is well-developed.  Eyes:     Conjunctiva/sclera: Conjunctivae normal.  Cardiovascular:     Rate and Rhythm: Normal rate and regular rhythm.     Pulses: Normal pulses.     Heart sounds: Normal heart sounds.  Pulmonary:     Effort: Pulmonary effort is normal.     Breath sounds: Normal breath sounds. No wheezing, rhonchi or rales.  Skin:    General: Skin is warm and dry.  Neurological:     Mental Status: She is alert.  Psychiatric:        Speech: Speech normal.        Behavior: Behavior normal.        Thought Content: Thought content normal.        Assessment & Plan:   Problem List Items Addressed This Visit      Cardiovascular and Mediastinum   Essential hypertension    Elevated today. Reports home readings well controlled. Continue amlodipine 2.5mg , hctz 12.5mg  for now and patient will call me with blood pressure readings from  Home.         Other   Anxiety    Controlled. Continue xanax 0.25mg  prn.       Hyperlipidemia, unspecified    Controlled. Continue lipitor 20mg , zetia 10mg .       Prediabetes    Lab Results  Component Value Date   HGBA1C 5.8 (A) 03/27/2021   Stable. Will continue to monitor.        Other Visit Diagnoses    Type 2 diabetes mellitus without complication, without long-term current use of insulin ([place])    -  Primary   Relevant Orders   POCT HgB A1C (Completed)       I am having [name]. Calise "Judy" maintain her Misc Natural Products (TOTAL CARDIO HEALTH FORMULA PO), Vitamin D, Fish Oil Triple Strength, Coenzyme Q10, Probiotic Product (PROBIOTIC DAILY PO), (Green Tea, Camellia sinensis, (GREEN TEA PO)), acetaminophen, CRANBERRY PO, Apoaequorin, meloxicam, Bayer Aspirin EC Low Dose, magnesium oxide, amLODipine, Synthroid, ezetimibe, hydrochlorothiazide, HYDROcodone-acetaminophen, acyclovir, ALPRAZolam, atorvastatin,  celecoxib, and pantoprazole.   No orders of the defined types were placed in this encounter.   Return precautions given.   Risks, benefits, and alternatives of the medications and treatment plan prescribed today were discussed, and patient expressed understanding.   Education regarding symptom management and diagnosis given to patient on AVS.  Continue to follow with Burnard Hawthorne, FNP for routine health maintenance.   [name] and I agreed with plan.   Mable Paris, FNP

## 2021-03-28 NOTE — Assessment & Plan Note (Signed)
Lab Results  Component Value Date   HGBA1C 5.8 (A) 03/27/2021   Stable. Will continue to monitor.

## 2021-03-28 NOTE — Assessment & Plan Note (Signed)
Elevated today. Reports home readings well controlled. Continue amlodipine 2.5mg , hctz 12.5mg  for now and patient will call me with blood pressure readings from  Home.

## 2021-03-28 NOTE — Assessment & Plan Note (Signed)
Controlled. Continue lipitor 20mg , zetia 10mg .

## 2021-03-28 NOTE — Assessment & Plan Note (Signed)
Controlled. Continue xanax 0.25mg  prn.

## 2021-04-26 ENCOUNTER — Other Ambulatory Visit (INDEPENDENT_AMBULATORY_CARE_PROVIDER_SITE_OTHER): Payer: Self-pay | Admitting: Vascular Surgery

## 2021-04-26 DIAGNOSIS — I6523 Occlusion and stenosis of bilateral carotid arteries: Secondary | ICD-10-CM

## 2021-04-27 ENCOUNTER — Ambulatory Visit (INDEPENDENT_AMBULATORY_CARE_PROVIDER_SITE_OTHER): Payer: Medicare Other

## 2021-04-27 ENCOUNTER — Encounter (INDEPENDENT_AMBULATORY_CARE_PROVIDER_SITE_OTHER): Payer: Self-pay | Admitting: Vascular Surgery

## 2021-04-27 ENCOUNTER — Other Ambulatory Visit: Payer: Self-pay

## 2021-04-27 ENCOUNTER — Ambulatory Visit (INDEPENDENT_AMBULATORY_CARE_PROVIDER_SITE_OTHER): Payer: Medicare Other | Admitting: Vascular Surgery

## 2021-04-27 VITALS — BP 170/66 | HR 67 | Resp 16 | Wt 138.6 lb

## 2021-04-27 DIAGNOSIS — I10 Essential (primary) hypertension: Secondary | ICD-10-CM | POA: Diagnosis not present

## 2021-04-27 DIAGNOSIS — E785 Hyperlipidemia, unspecified: Secondary | ICD-10-CM | POA: Diagnosis not present

## 2021-04-27 DIAGNOSIS — I6523 Occlusion and stenosis of bilateral carotid arteries: Secondary | ICD-10-CM | POA: Diagnosis not present

## 2021-04-27 DIAGNOSIS — I739 Peripheral vascular disease, unspecified: Secondary | ICD-10-CM | POA: Diagnosis not present

## 2021-04-27 DIAGNOSIS — K219 Gastro-esophageal reflux disease without esophagitis: Secondary | ICD-10-CM

## 2021-04-30 ENCOUNTER — Encounter (INDEPENDENT_AMBULATORY_CARE_PROVIDER_SITE_OTHER): Payer: Self-pay | Admitting: Vascular Surgery

## 2021-04-30 NOTE — Progress Notes (Signed)
MRN : [id number]  Mary Pacheco is a 80 y.o. (1941-05-10) female who presents with chief complaint of  Chief Complaint  Patient presents with   Follow-up    Ultrasound follow up  .  History of Present Illness:   The patient is seen for follow up evaluation of carotid stenosis. The carotid stenosis followed by ultrasound.   The patient denies amaurosis fugax. There is no recent history of TIA symptoms or focal motor deficits. There is no prior documented CVA.   The patient is taking enteric-coated aspirin 81 mg daily.   There is no history of migraine headaches. There is no history of seizures.   The patient has a history of coronary artery disease, no recent episodes of angina or shortness of breath. The patient denies PAD or claudication symptoms. There is a history of hyperlipidemia which is being treated with a statin.     Carotid Duplex done today shows 88-28% RICA and <00% LICA.  No significant change in the stenosis of the right compared to last study in 12/29/2020, velocities were about the same.  Current Meds  Medication Sig   acetaminophen (TYLENOL) 500 MG tablet Take 1,000 mg by mouth every 6 (six) hours as needed for moderate pain.   acyclovir (ZOVIRAX) 400 MG tablet TAKE 1 TABLET BY MOUTH EVERY DAY   ALPRAZolam (XANAX) 0.25 MG tablet TAKE 1/2 TABLET (0.125 MG TOTAL) BY MOUTH DAILY AS NEEDED FOR ANXIETY.   amLODipine (NORVASC) 2.5 MG tablet Take 1 tablet (2.5 mg total) by mouth daily. May take additional dose for blood pressure > 135/85. (Patient taking differently: Take 2.5 mg by mouth at bedtime. May take additional dose for blood pressure > 135/85.)   Apoaequorin 10 MG CAPS Take 1 tablet by mouth daily.    atorvastatin (LIPITOR) 20 MG tablet TAKE 1 TABLET BY MOUTH EVERY DAY   BAYER ASPIRIN EC LOW DOSE 81 MG EC tablet Take 81 mg by mouth daily.    celecoxib (CELEBREX) 200 MG capsule TAKE 1 CAPSULE BY MOUTH TWICE A DAY   Cholecalciferol (VITAMIN D) 125 MCG (5000 UT)  CAPS Take 5,000 Units by mouth daily.   Coenzyme Q10 200 MG capsule Take 200 mg by mouth daily.    CRANBERRY PO Take 650 mg by mouth daily.   ezetimibe (ZETIA) 10 MG tablet TAKE 1 TABLET DAILY (Patient taking differently: Take 10 mg by mouth every evening.)   Green Tea, Camellia sinensis, (GREEN TEA PO) Take 500 mg by mouth daily.    hydrochlorothiazide (MICROZIDE) 12.5 MG capsule TAKE 1 CAPSULE DAILY (Patient taking differently: Take 12.5 mg by mouth daily.)   magnesium oxide (MAG-OX) 400 MG tablet Take 400 mg by mouth daily.   Misc Natural Products (TOTAL CARDIO HEALTH FORMULA PO) Take 1 tablet by mouth daily.   Omega-3 Fatty Acids (FISH OIL TRIPLE STRENGTH) 1400 MG CAPS Take 1,400 mg by mouth daily.    pantoprazole (PROTONIX) 40 MG tablet TAKE 1 TABLET DAILY   Probiotic Product (PROBIOTIC DAILY PO) Take 1 capsule by mouth daily.    SYNTHROID 50 MCG tablet TAKE 1 TABLET DAILY (Patient taking differently: Take 50 mcg by mouth daily before breakfast.)    Past Medical History:  Diagnosis Date   Anemia    in the past    Anxiety 2011   patient with anxiety will continue Alprazolam prn   Arthritis    Asthma    grew up with asthma, but not as an adult  Carotid artery disease (HCC)    GERD (gastroesophageal reflux disease)    pt. with esophagea reflux. She reports great improvement with Dexilant.   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 lipids well controlled   Hypertension    Hypothyroidism    PAD (peripheral artery disease) (HCC)    Pre-diabetes    Skin cancer    left leg    Past Surgical History:  Procedure Laterality Date   [place]   multiple fibroid cysts   cataract surgery  02/2011   bilateral   COLONOSCOPY WITH PROPOFOL N/A 04/19/2020   Procedure: COLONOSCOPY WITH PROPOFOL;  Surgeon: Virgel Manifold, MD;  Location: [facility];   Service: Endoscopy;  Laterality: N/A;   EYE SURGERY     JOINT REPLACEMENT     KNEE ARTHROPLASTY Left 08/19/2019   Procedure: COMPUTER ASSISTED TOTAL KNEE ARTHROPLASTY;  Surgeon: Dereck Leep, MD;  Location: [facility];  Service: Orthopedics;  Laterality: Left;   KNEE ARTHROSCOPY Right 11/09/2020   Procedure: ARTHROSCOPY KNEE;  Surgeon: Dereck Leep, MD;  Location: [facility];  Service: Orthopedics;  Laterality: Right;  FOLLOWING 2ND CASE   skin cancer removal     TONSILLECTOMY      Social History Social History   Tobacco Use   Smoking status: Never   Smokeless tobacco: Never  Vaping Use   Vaping Use: Never used  Substance Use Topics   Alcohol use: Yes    Comment: socially   Drug use: No    Family History Family History  Problem Relation Age of Onset   Heart failure Mother    Heart disease Mother        CABG   Hypertension Mother    Hyperlipidemia Mother    Stroke Mother        mini strokes, multiple   Heart attack Father    Heart failure Father    Hyperlipidemia Father    Hypertension Father    Heart attack Sister 8       MI   Hernia Sister    Prostate cancer Brother     Allergies  Allergen Reactions   Molds & Smuts Other (See Comments)    Induces asthma Induces asthma   Rosuvastatin Hives   Simvastatin Other (See Comments)    myalgia     REVIEW OF SYSTEMS (Negative unless checked)  Constitutional: [] Weight loss  [] Fever  [] Chills Cardiac: [] Chest pain   [] Chest pressure   [] Palpitations   [] Shortness of breath when laying flat   [] Shortness of breath with exertion. Vascular:  [] Pain in legs with walking   [] Pain in legs at rest  [] History of DVT   [] Phlebitis   [] Swelling in legs   [] Varicose veins   [] Non-healing ulcers Pulmonary:   [] Uses home oxygen   [] Productive cough   [] Hemoptysis   [] Wheeze  [] COPD   [] Asthma Neurologic:  [] Dizziness   [] Seizures   [] History of stroke   [] History of TIA  [] Aphasia   [] Vissual changes   [] Weakness or numbness in  arm   [] Weakness or numbness in leg Musculoskeletal:   [] Joint swelling   [x] Joint pain   [] Low back pain Hematologic:  [] Easy bruising  [] Easy bleeding   [] Hypercoagulable state   [] Anemic Gastrointestinal:  [] Diarrhea   [] Vomiting  [x] Gastroesophageal reflux/heartburn   [] Difficulty swallowing. Genitourinary:  []   Chronic kidney disease   [] Difficult urination  [] Frequent urination   [] Blood in urine Skin:  [] Rashes   [] Ulcers  Psychological:  [] History of anxiety   []  History of major depression.  Physical Examination  Vitals:   04/27/21 1541  BP: (!) 170/66  Pulse: 67  Resp: 16  Weight: 138 lb 9.6 oz (62.9 kg)   Body mass index is 24.55 kg/m. Gen: WD/WN, NAD Head: [place]/AT, No temporalis wasting.  Ear/Nose/Throat: Hearing grossly intact, nares w/o erythema or drainage Eyes: PER, EOMI, sclera nonicteric.  Neck: Supple, no large masses.   Pulmonary:  Good air movement, no audible wheezing bilaterally, no use of accessory muscles.  Cardiac: RRR, no JVD Vascular:  scattered varicosities present bilaterally.  Mild venous stasis changes to the legs bilaterally.  2+ soft pitting edema  Vessel Right Left  Radial Palpable Palpable  Carotid Palpable Palpable  Gastrointestinal: Non-distended. No guarding/no peritoneal signs.  Musculoskeletal: M/S 5/5 throughout.  No deformity or atrophy.  Neurologic: CN 2-12 intact. Symmetrical.  Speech is fluent. Motor exam as listed above. Psychiatric: Judgment intact, Mood & affect appropriate for pt's clinical situation. Dermatologic: No rashes or ulcers noted.  No changes consistent with cellulitis. Lymph : No lichenification or skin changes of chronic lymphedema.  CBC Lab Results  Component Value Date   WBC 6.2 03/25/2020   HGB 12.0 03/25/2020   HCT 36.7 03/25/2020   MCV 87.1 03/25/2020   PLT 316.0 03/25/2020    BMET    Component Value Date/Time   NA 132 (L) 03/22/2021 0849   NA 140 12/28/2011 1819   K 4.0 03/22/2021 0849   K 3.2 (L)  12/28/2011 1819   CL 97 03/22/2021 0849   CL 104 12/28/2011 1819   CO2 28 03/22/2021 0849   CO2 27 12/28/2011 1819   GLUCOSE 127 (H) 03/22/2021 0849   GLUCOSE 98 12/28/2011 1819   BUN 18 03/22/2021 0849   BUN 13 12/28/2011 1819   CREATININE 0.93 03/22/2021 0849   CREATININE 1.00 11/06/2013 0854   CALCIUM 9.6 03/22/2021 0849   CALCIUM 9.1 12/28/2011 1819   GFRNONAA >60 08/11/2019 1142   GFRNONAA 56 (L) 11/06/2013 0854   GFRAA >60 08/11/2019 1142   GFRAA >60 11/06/2013 0854   CrCl cannot be calculated (Patient's most recent lab result is older than the maximum 21 days allowed.).  COAG Lab Results  Component Value Date   INR 1.0 08/11/2019    Radiology No results found.   Assessment/Plan 1. Bilateral carotid artery stenosis Recommend:   Given the patient's asymptomatic subcritical stenosis no further invasive testing or surgery at this time.   Duplex ultrasound shows 96-78% RICA and <93% LICA stenosis bilaterally.   Continue antiplatelet therapy as prescribed Continue management of CAD, HTN and Hyperlipidemia Healthy heart diet,  encouraged exercise at least 4 times per week.   Follow up in 12 months with duplex ultrasound and physical - VAS US CAROTID; Future  2. Essential hypertension Continue antihypertensive medications as already ordered, these medications have been reviewed and there are no changes at this time.   3. PAD (peripheral artery disease) (HCC)  Recommend:  The patient has evidence of atherosclerosis of the lower extremities with claudication.  The patient does not voice lifestyle limiting changes at this point in time.  Noninvasive studies do not suggest clinically significant change.  No invasive studies, angiography or surgery at this time The patient should continue walking and begin a more formal exercise program.  The patient should continue  antiplatelet therapy and aggressive treatment of the lipid abnormalities  No changes in the  patient's medications at this time  The patient should continue wearing graduated compression socks 10-15 mmHg strength to control the mild edema.    4. Hyperlipidemia, unspecified hyperlipidemia type Continue statin as ordered and reviewed, no changes at this time   5. Gastroesophageal reflux disease, unspecified whether esophagitis present Continue PPI as already ordered, this medication has been reviewed and there are no changes at this time.  Avoidence of caffeine and alcohol  Moderate elevation of the head of the bed      Hortencia Pilar, MD  04/30/2021 10:21 AM

## 2021-05-02 ENCOUNTER — Telehealth: Payer: Self-pay

## 2021-05-02 NOTE — Telephone Encounter (Signed)
Pt called and she apologized for being so late in giving her blood pressure readings. She is taking HCTZ 12.5mg  daily along with amlodipine 2.5mg  daily.   6/6 123/49 P 61 AT 11P 6/16 139/54 P 71 taken afternoon 6/30 128/72 P 76 at 10:30p 7/2 124/79 P 67 at 10:30 am 7/3 104/57 P 62 at 10:30 am 7/12 110/48 P 84 after being active  Pt stated that she feels fine just slight HA at times feels it may be due to humidity & sinuses.

## 2021-05-02 NOTE — Telephone Encounter (Signed)
Pt called and explained below. She will call us if any changes in HA accompanied with any other symptoms such as vision changes.

## 2021-05-02 NOTE — Telephone Encounter (Signed)
Call pt Reviewed BP Overall well controlled, improved from last office visit.  Continue HCTZ 12.5mg  daily along with amlodipine 2.5mg  daily.  Please monitor HA and if increased in frequency , severity or associated with concerning symptoms including visual changes, please advise her to call us    BP Readings from Last 3 Encounters:  04/27/21 (!) 170/66  03/27/21 (!) 156/56  11/30/20 123/60

## 2021-05-03 ENCOUNTER — Encounter: Payer: Self-pay | Admitting: Cardiovascular Disease

## 2021-05-03 ENCOUNTER — Ambulatory Visit: Payer: Medicare Other | Admitting: Cardiovascular Disease

## 2021-05-03 ENCOUNTER — Other Ambulatory Visit: Payer: Self-pay

## 2021-05-03 VITALS — BP 140/60 | HR 76 | Ht 63.0 in | Wt 138.4 lb

## 2021-05-03 DIAGNOSIS — E782 Mixed hyperlipidemia: Secondary | ICD-10-CM

## 2021-05-03 DIAGNOSIS — I951 Orthostatic hypotension: Secondary | ICD-10-CM | POA: Diagnosis not present

## 2021-05-03 DIAGNOSIS — I6523 Occlusion and stenosis of bilateral carotid arteries: Secondary | ICD-10-CM

## 2021-05-03 DIAGNOSIS — I10 Essential (primary) hypertension: Secondary | ICD-10-CM

## 2021-05-03 DIAGNOSIS — I739 Peripheral vascular disease, unspecified: Secondary | ICD-10-CM | POA: Diagnosis not present

## 2021-05-03 NOTE — Patient Instructions (Addendum)
Medication Instructions:  No changes  If you need a refill on your cardiac medications before your next appointment, please call your pharmacy.   Lab work: No new labs needed  Testing/Procedures: No new testing needed  Follow-Up: At [facility], you and your health needs are our priority.  As part of our continuing mission to provide you with exceptional heart care, we have created designated Provider Care Teams.  These Care Teams include your primary Cardiologist (physician) and Advanced Practice Providers (APPs -  Physician Assistants and Nurse Practitioners) who all work together to provide you with the care you need, when you need it.  You will need a follow up appointment in 12 months  Providers on your designated Care Team:   Christopher Berge, NP Ryan Dunn, PA-C Jacquelyn Visser, PA-C Cadence Furth, PA-C  COVID-19 Vaccine Information can be found at: [email or web address] For questions related to vaccine distribution or appointments, please email [email or web address] or call [phone].    

## 2021-05-03 NOTE — Progress Notes (Signed)
Date:  05/03/2021   ID:  [name], DOB [birth year], MRN [id number]  Patient Location:  [place] [address]   Provider location:   [facility], [place] office  PCP:  Burnard Hawthorne, FNP  Cardiologist:  Ida Rogue, MD   Chief Complaint  Patient presents with   6 month follow up     Patient c/o blood pressure fluctuating. Medications reviewed by the patient verbally.     History of Present Illness:    Mary Pacheco is a 80 y.o. female PMH of  hypertension,  hyperlipidemia,  bilateral carotid arterial disease,  Ultrasound March 2021 Right Carotid: Velocities in the right ICA are consistent with a low-end  60-79% stenosis. Less than 39% disease on the left anxiety  Labile hypertension presenting for routine followup of her PAD/carotid disease, hyperlipidemia  Recent blood pressure numbers reviewed in the chart on phone call Sometimes low Asymptomatic Numbers typically higher in the doctor's offices but well controlled at home Some stressors at home, husband with medical issues,  Carotid ultrasound 04/2021 no significant changes  Tolerating Lipitor Zetia LDL at goal, 46 Previously did not tolerate Lipitor 40, doing well on 20 mg  EKG personally reviewed by myself on todays visit NSR rate 76 bpm no ST or T wave changes   Numbers discussed with her Lab Results  Component Value Date   CHOL 137 03/22/2021   HDL 76.80 03/22/2021   LDLCALC 46 03/22/2021   TRIG 74.0 03/22/2021     Past Medical History:  Diagnosis Date   Anemia    in the past    Anxiety 2011   patient with anxiety will continue Alprazolam prn   Arthritis    Asthma    grew up with asthma, but not as an adult    Carotid artery disease ([place])    GERD (gastroesophageal reflux disease)    pt. with esophagea reflux. She reports great improvement with Dexilant.   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 lipids well controlled   Hypertension    Hypothyroidism    PAD (peripheral artery disease) (HCC)    Pre-diabetes    Skin cancer    left leg   Past Surgical History:  Procedure Laterality Date   [place]   multiple fibroid cysts   cataract surgery  02/2011   bilateral   COLONOSCOPY WITH PROPOFOL N/A 04/19/2020   Procedure: COLONOSCOPY WITH PROPOFOL;  Surgeon: Virgel Manifold, MD;  Location: [facility] ENDOSCOPY;  Service: Endoscopy;  Laterality: N/A;   EYE SURGERY     JOINT REPLACEMENT     KNEE ARTHROPLASTY Left 08/19/2019   Procedure: COMPUTER ASSISTED TOTAL KNEE ARTHROPLASTY;  Surgeon: Dereck Leep, MD;  Location: [facility] ORS;  Service: Orthopedics;  Laterality: Left;   KNEE ARTHROSCOPY Right 11/09/2020   Procedure: ARTHROSCOPY KNEE;  Surgeon: Dereck Leep, MD;  Location: [facility] ORS;  Service: Orthopedics;  Laterality: Right;  FOLLOWING 2ND CASE   skin cancer removal     TONSILLECTOMY       Current Meds  Medication Sig   acetaminophen (TYLENOL) 500 MG tablet Take 1,000 mg by mouth every 6 (six) hours as needed for moderate pain.   acyclovir (ZOVIRAX) 400 MG tablet TAKE 1 TABLET BY MOUTH EVERY DAY   ALPRAZolam (  XANAX) 0.25 MG tablet TAKE 1/2 TABLET (0.125 MG TOTAL) BY MOUTH DAILY AS NEEDED FOR ANXIETY.   amLODipine (NORVASC) 2.5 MG tablet Take 1 tablet (2.5 mg total) by mouth daily. May take additional dose for blood pressure > 135/85. (Patient taking differently: Take 2.5 mg by mouth at bedtime. May take additional dose for blood pressure > 135/85.)   Apoaequorin 10 MG CAPS Take 1 tablet by mouth daily.    atorvastatin (LIPITOR) 20 MG tablet TAKE 1 TABLET BY MOUTH EVERY DAY   BAYER ASPIRIN EC LOW DOSE 81 MG EC tablet Take 81 mg by mouth daily.    celecoxib (CELEBREX) 200 MG capsule TAKE 1 CAPSULE BY MOUTH TWICE A DAY   Cholecalciferol (VITAMIN D) 125 MCG (5000 UT) CAPS Take 5,000 Units by mouth  daily.   Coenzyme Q10 200 MG capsule Take 200 mg by mouth daily.    CRANBERRY PO Take 650 mg by mouth daily.   ezetimibe (ZETIA) 10 MG tablet TAKE 1 TABLET DAILY (Patient taking differently: Take 10 mg by mouth every evening.)   Green Tea, Camellia sinensis, (GREEN TEA PO) Take 500 mg by mouth daily.    hydrochlorothiazide (HYDRODIURIL) 25 MG tablet Take 12.5 mg by mouth daily.   magnesium oxide (MAG-OX) 400 MG tablet Take 400 mg by mouth daily.   Misc Natural Products (TOTAL CARDIO HEALTH FORMULA PO) Take 1 tablet by mouth daily.   Omega-3 Fatty Acids (FISH OIL TRIPLE STRENGTH) 1400 MG CAPS Take 1,400 mg by mouth daily.    pantoprazole (PROTONIX) 40 MG tablet TAKE 1 TABLET DAILY   Probiotic Product (PROBIOTIC DAILY PO) Take 1 capsule by mouth daily.    SYNTHROID 50 MCG tablet TAKE 1 TABLET DAILY (Patient taking differently: Take 50 mcg by mouth daily before breakfast.)     Allergies:   Molds & smuts, Rosuvastatin, and Simvastatin   Social History   Tobacco Use   Smoking status: Never   Smokeless tobacco: Never  Vaping Use   Vaping Use: Never used  Substance Use Topics   Alcohol use: Yes    Comment: socially   Drug use: No     Family Hx: The patient's family history includes Heart attack in her father; Heart attack (age of onset: 92) in her sister; Heart disease in her mother; Heart failure in her father and mother; Hernia in her sister; Hyperlipidemia in her father and mother; Hypertension in her father and mother; Prostate cancer in her brother; Stroke in her mother.  ROS:   Please see the history of present illness.    Review of Systems  Constitutional: Negative.   HENT: Negative.    Respiratory: Negative.    Cardiovascular: Negative.   Gastrointestinal: Negative.   Musculoskeletal:  Positive for joint pain.  Neurological: Negative.   Psychiatric/Behavioral: Negative.    All other systems reviewed and are negative.   Labs/Other Tests and Data Reviewed:    Recent  Labs: 09/01/2020: TSH 1.66 03/22/2021: ALT 17; BUN 18; Creatinine, Ser 0.93; Potassium 4.0; Sodium 132   Recent Lipid Panel Lab Results  Component Value Date/Time   CHOL 137 03/22/2021 08:49 AM   TRIG 74.0 03/22/2021 08:49 AM   HDL 76.80 03/22/2021 08:49 AM   CHOLHDL 2 03/22/2021 08:49 AM   LDLCALC 46 03/22/2021 08:49 AM    Wt Readings from Last 3 Encounters:  05/03/21 138 lb 6 oz (62.8 kg)  04/27/21 138 lb 9.6 oz (62.9 kg)  03/27/21 139 lb 6.4 oz (63.2 kg)  Exam:   BP (!) 160/60 (BP Location: Left Arm, Patient Position: Sitting, Cuff Size: Normal)   Pulse 76   Ht 5\' 3"  (1.6 m)   Wt 138 lb 6 oz (62.8 kg)   SpO2 98%   BMI 24.51 kg/m  Constitutional:  oriented to person, place, and time. No distress.  HENT:  Head: Grossly normal Eyes:  no discharge. No scleral icterus.  Neck: No JVD, no carotid bruits  Cardiovascular: Regular rate and rhythm, no murmurs appreciated Pulmonary/Chest: Clear to auscultation bilaterally, no wheezes or rails Abdominal: Soft.  no distension.  no tenderness.  Musculoskeletal: Normal range of motion Neurological:  normal muscle tone. Coordination normal. No atrophy Skin: Skin warm and dry Psychiatric: normal affect, pleasant   ASSESSMENT & PLAN:    PAD (peripheral artery disease) (HCC) Discussed prior carotid ultrasound with her, stable Zetia , Lipitor LDL at goal  Bilateral carotid artery stenosis Ultrasound 04/2021 59% stenosis on the right Less than 39% disease on the left  Mixed hyperlipidemia Lipitor with Zetia, Cholesterol is at goal on the current lipid regimen. No changes to the medications were made.  Essential hypertension Blood pressure is well controlled on today's visit. No changes made to the medications.  Prediabetes Trying to watch her diet Does not like to cook A1C 5.8   Total encounter time more than 25 minutes  Greater than 50% was spent in counseling and coordination of care with the  patient    Signed, Ida Rogue, MD  05/03/2021 2:10 PM    [facility] [address], [place], [place] [address]

## 2021-06-01 ENCOUNTER — Other Ambulatory Visit: Payer: Self-pay | Admitting: Family

## 2021-06-01 DIAGNOSIS — I10 Essential (primary) hypertension: Secondary | ICD-10-CM

## 2021-06-11 ENCOUNTER — Other Ambulatory Visit: Payer: Self-pay | Admitting: Family

## 2021-06-23 ENCOUNTER — Other Ambulatory Visit: Payer: Self-pay | Admitting: Family

## 2021-07-06 ENCOUNTER — Encounter (INDEPENDENT_AMBULATORY_CARE_PROVIDER_SITE_OTHER): Payer: Medicare Other

## 2021-07-06 ENCOUNTER — Ambulatory Visit (INDEPENDENT_AMBULATORY_CARE_PROVIDER_SITE_OTHER): Payer: Medicare Other | Admitting: Vascular Surgery

## 2021-07-09 ENCOUNTER — Other Ambulatory Visit: Payer: Self-pay | Admitting: Family

## 2021-07-09 ENCOUNTER — Other Ambulatory Visit: Payer: Self-pay | Admitting: Cardiovascular Disease

## 2021-07-18 ENCOUNTER — Telehealth: Payer: Self-pay | Admitting: Family

## 2021-07-18 NOTE — Telephone Encounter (Signed)
LMTCB for results. 

## 2021-07-18 NOTE — Telephone Encounter (Signed)
Call patient Mammogram at [facility] was normal.  Please repeat in 1 year

## 2021-07-19 NOTE — Telephone Encounter (Signed)
Patient is aware of her results.

## 2021-08-07 ENCOUNTER — Other Ambulatory Visit: Payer: Self-pay | Admitting: Family

## 2021-10-02 ENCOUNTER — Ambulatory Visit: Payer: Medicare Other | Admitting: Family

## 2021-10-02 ENCOUNTER — Other Ambulatory Visit: Payer: Self-pay

## 2021-10-02 ENCOUNTER — Encounter: Payer: Self-pay | Admitting: Family

## 2021-10-02 VITALS — BP 136/60 | HR 72 | Temp 98.1°F | Ht 63.0 in | Wt 140.2 lb

## 2021-10-02 DIAGNOSIS — E039 Hypothyroidism, unspecified: Secondary | ICD-10-CM

## 2021-10-02 DIAGNOSIS — F419 Anxiety disorder, unspecified: Secondary | ICD-10-CM

## 2021-10-02 DIAGNOSIS — I10 Essential (primary) hypertension: Secondary | ICD-10-CM

## 2021-10-02 NOTE — Assessment & Plan Note (Signed)
Anticipate euthyroid.  Pending TSH.  Continue Synthroid 50 MCG

## 2021-10-02 NOTE — Assessment & Plan Note (Signed)
Stable, controlled.  Rare use of Xanax 0.25 mg.  Continue as needed

## 2021-10-02 NOTE — Patient Instructions (Signed)
Nice to see you!   

## 2021-10-02 NOTE — Assessment & Plan Note (Signed)
Chronic, stable.  Continue amlodipine 2.5 mg, hydrochlorothiazide 25 mg

## 2021-10-02 NOTE — Addendum Note (Signed)
Addended by: Cheri Rous E on: 10/02/2021 02:28 PM   Modules accepted: Orders

## 2021-10-02 NOTE — Progress Notes (Signed)
Subjective:    Patient ID: Mary Pacheco, female    DOB: 1941/05/03, 80 y.o.   MRN: [id number]  CC: Mary Pacheco is a 80 y.o. female who presents today for follow up.   HPI: Feels well today  No complaints   HTN-compliant with amlodipine 2.5 mg, hydrochlorothiazide 25 mg. No cp.   Anxiety-she takes Xanax 0.25 mg half tablet as needed for anxiety  hypothyroidism-compliant with Synthroid 50 mcg  Follow up with Dr. Rockey Situ 05/03/2021 for peripheral artery disease, bilateral carotid artery stenosis, mixed hyperlipidemia and hypertension.  Advised to continue Lipitor, Zetia.  No medication changes made.  HISTORY:  Past Medical History:  Diagnosis Date   Anemia    in the past    Anxiety 2011   patient with anxiety will continue Alprazolam prn   Arthritis    Asthma    grew up with asthma, but not as an adult    Carotid artery disease ([place])    GERD (gastroesophageal reflux disease)    pt. with esophagea reflux. She reports great improvement with Dexilant.   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 lipids well controlled   Hypertension    Hypothyroidism    PAD (peripheral artery disease) (HCC)    Pre-diabetes    Skin cancer    left leg   Past Surgical History:  Procedure Laterality Date   [place]   multiple fibroid cysts   cataract surgery  02/2011   bilateral   COLONOSCOPY WITH PROPOFOL N/A 04/19/2020   Procedure: COLONOSCOPY WITH PROPOFOL;  Surgeon: Virgel Manifold, MD;  Location: [facility] ENDOSCOPY;  Service: Endoscopy;  Laterality: N/A;   EYE SURGERY     JOINT REPLACEMENT     KNEE ARTHROPLASTY Left 08/19/2019   Procedure: COMPUTER ASSISTED TOTAL KNEE ARTHROPLASTY;  Surgeon: Dereck Leep, MD;  Location: [facility] ORS;  Service: Orthopedics;  Laterality: Left;   KNEE ARTHROSCOPY Right 11/09/2020   Procedure: ARTHROSCOPY KNEE;  Surgeon:  Dereck Leep, MD;  Location: [facility] ORS;  Service: Orthopedics;  Laterality: Right;  FOLLOWING 2ND CASE   skin cancer removal     TONSILLECTOMY     Family History  Problem Relation Age of Onset   Heart failure Mother    Heart disease Mother        CABG   Hypertension Mother    Hyperlipidemia Mother    Stroke Mother        mini strokes, multiple   Heart attack Father    Heart failure Father    Hyperlipidemia Father    Hypertension Father    Heart attack Sister 30       MI   Hernia Sister    Prostate cancer Brother     Allergies: Molds & smuts, Rosuvastatin, and Simvastatin Current Outpatient Medications on File Prior to Visit  Medication Sig Dispense Refill   acetaminophen (TYLENOL) 500 MG tablet Take 1,000 mg by mouth every 6 (six) hours as needed for moderate pain.     acyclovir (ZOVIRAX) 400 MG tablet TAKE 1 TABLET BY MOUTH EVERY DAY 90 tablet 2   ALPRAZolam (XANAX) 0.25 MG tablet TAKE 1/2 TABLET (0.125 MG TOTAL) BY MOUTH DAILY AS NEEDED FOR ANXIETY. 30 tablet 1   amLODipine (NORVASC) 2.5 MG tablet Take 1 tablet (2.5 mg total) by mouth at bedtime. May  take additional dose for blood pressure > 135/85. 90 tablet 1   Apoaequorin 10 MG CAPS Take 1 tablet by mouth daily.      atorvastatin (LIPITOR) 20 MG tablet TAKE 1 TABLET BY MOUTH EVERY DAY 90 tablet 1   BAYER ASPIRIN EC LOW DOSE 81 MG EC tablet Take 81 mg by mouth daily.      celecoxib (CELEBREX) 200 MG capsule TAKE 1 CAPSULE BY MOUTH TWICE A DAY 84 capsule 1   Cholecalciferol (VITAMIN D) 125 MCG (5000 UT) CAPS Take 5,000 Units by mouth daily.     Coenzyme Q10 200 MG capsule Take 200 mg by mouth daily.      CRANBERRY PO Take 650 mg by mouth daily.     ezetimibe (ZETIA) 10 MG tablet TAKE 1 TABLET DAILY 90 tablet 2   Green Tea, Camellia sinensis, (GREEN TEA PO) Take 500 mg by mouth daily.      hydrochlorothiazide (HYDRODIURIL) 25 MG tablet Take 12.5 mg by mouth daily.     magnesium oxide (MAG-OX) 400 MG tablet Take 400 mg by  mouth daily.     Misc Natural Products (TOTAL CARDIO HEALTH FORMULA PO) Take 1 tablet by mouth daily.     Omega-3 Fatty Acids (FISH OIL TRIPLE STRENGTH) 1400 MG CAPS Take 1,400 mg by mouth daily.      pantoprazole (PROTONIX) 40 MG tablet TAKE 1 TABLET DAILY 90 tablet 0   Probiotic Product (PROBIOTIC DAILY PO) Take 1 capsule by mouth daily.      SYNTHROID 50 MCG tablet TAKE 1 TABLET DAILY 90 tablet 3   No current facility-administered medications on file prior to visit.    Social History   Tobacco Use   Smoking status: Never   Smokeless tobacco: Never  Vaping Use   Vaping Use: Never used  Substance Use Topics   Alcohol use: Yes    Comment: socially   Drug use: No    Review of Systems  Constitutional:  Negative for chills and fever.  Respiratory:  Negative for cough and shortness of breath.   Cardiovascular:  Negative for chest pain, palpitations and leg swelling.  Gastrointestinal:  Negative for nausea and vomiting.     Objective:    BP 136/60 (BP Location: Left Arm, Patient Position: Sitting, Cuff Size: Normal)   Pulse 72   Temp 98.1 F (36.7 C) (Oral)   Ht 5\' 3"  (1.6 m)   Wt 140 lb 3.2 oz (63.6 kg)   SpO2 99%   BMI 24.84 kg/m  BP Readings from Last 3 Encounters:  10/02/21 136/60  05/03/21 140/60  04/27/21 (!) 170/66   Wt Readings from Last 3 Encounters:  10/02/21 140 lb 3.2 oz (63.6 kg)  05/03/21 138 lb 6 oz (62.8 kg)  04/27/21 138 lb 9.6 oz (62.9 kg)    Physical Exam Vitals reviewed.  Constitutional:      Appearance: She is well-developed.  Eyes:     Conjunctiva/sclera: Conjunctivae normal.  Cardiovascular:     Rate and Rhythm: Normal rate and regular rhythm.     Pulses: Normal pulses.     Heart sounds: Normal heart sounds.  Pulmonary:     Effort: Pulmonary effort is normal.     Breath sounds: Normal breath sounds. No wheezing, rhonchi or rales.  Musculoskeletal:     Right lower leg: No edema.     Left lower leg: No edema.  Skin:    General: Skin  is warm and dry.  Neurological:  Mental Status: She is alert.  Psychiatric:        Speech: Speech normal.        Behavior: Behavior normal.        Thought Content: Thought content normal.       Assessment & Plan:   Problem List Items Addressed This Visit       Cardiovascular and Mediastinum   Essential hypertension - Primary    Chronic, stable.  Continue amlodipine 2.5 mg, hydrochlorothiazide 25 mg      Relevant Orders   Basic metabolic panel   Hemoglobin A1c   Type and screen   Microalbumin / creatinine urine ratio     Endocrine   Hypothyroidism    Anticipate euthyroid.  Pending TSH.  Continue Synthroid 50 MCG      Relevant Orders   TSH     Other   Anxiety    Stable, controlled.  Rare use of Xanax 0.25 mg.  Continue as needed        I have discontinued [name]. Rill "Judy"'s meloxicam and HYDROcodone-acetaminophen. I am also having her maintain her Misc Natural Products (TOTAL CARDIO HEALTH FORMULA PO), Vitamin D, Fish Oil Triple Strength, Coenzyme Q10, Probiotic Product (PROBIOTIC DAILY PO), (Green Tea, Camellia sinensis, (GREEN TEA PO)), acetaminophen, CRANBERRY PO, Apoaequorin, Bayer Aspirin EC Low Dose, magnesium oxide, ALPRAZolam, atorvastatin, hydrochlorothiazide, amLODipine, celecoxib, Synthroid, pantoprazole, ezetimibe, and acyclovir.   No orders of the defined types were placed in this encounter.   Return precautions given.   Risks, benefits, and alternatives of the medications and treatment plan prescribed today were discussed, and patient expressed understanding.   Education regarding symptom management and diagnosis given to patient on AVS.  Continue to follow with Burnard Hawthorne, FNP for routine health maintenance.   [name] and I agreed with plan.   Mable Paris, FNP

## 2021-10-03 LAB — TSH: TSH: 1.47 u[IU]/mL (ref 0.35–5.50)

## 2021-10-03 LAB — BASIC METABOLIC PANEL
BUN: 17 mg/dL (ref 6–23)
CO2: 28 mEq/L (ref 19–32)
Calcium: 9.9 mg/dL (ref 8.4–10.5)
Chloride: 103 mEq/L (ref 96–112)
Creatinine, Ser: 0.87 mg/dL (ref 0.40–1.20)
GFR: 62.59 mL/min (ref >60.00)
Glucose, Bld: 92 mg/dL (ref 70–99)
Potassium: 4.5 mEq/L (ref 3.5–5.1)
Sodium: 139 mEq/L (ref 135–145)

## 2021-10-03 LAB — HEMOGLOBIN A1C: Hgb A1c MFr Bld: 6 % (ref 4.6–6.5)

## 2021-10-03 LAB — MICROALBUMIN / CREATININE URINE RATIO
Creatinine,U: 79 mg/dL
Microalb Creat Ratio: 1.1 mg/g (ref 0.0–30.0)
Microalb, Ur: 0.9 mg/dL (ref 0.0–1.9)

## 2021-10-10 ENCOUNTER — Telehealth: Payer: Self-pay | Admitting: Family

## 2021-10-10 NOTE — Telephone Encounter (Signed)
Patient  called in requesting results from  lab and would like a hard copy mail out  to her

## 2021-10-10 NOTE — Telephone Encounter (Signed)
I have spoken with patient & lab results reviewed. I have mailed to patient.

## 2021-11-09 ENCOUNTER — Other Ambulatory Visit: Payer: Self-pay | Admitting: Family

## 2021-11-16 ENCOUNTER — Other Ambulatory Visit: Payer: Self-pay | Admitting: Family

## 2021-11-29 ENCOUNTER — Other Ambulatory Visit: Payer: Self-pay | Admitting: Family

## 2021-11-29 ENCOUNTER — Other Ambulatory Visit: Payer: Self-pay | Admitting: Internal Medicine

## 2021-11-29 DIAGNOSIS — I10 Essential (primary) hypertension: Secondary | ICD-10-CM

## 2021-12-01 ENCOUNTER — Ambulatory Visit: Payer: Medicare Other

## 2021-12-14 ENCOUNTER — Ambulatory Visit (INDEPENDENT_AMBULATORY_CARE_PROVIDER_SITE_OTHER): Payer: Medicare Other

## 2021-12-14 VITALS — Ht 63.0 in | Wt 140.0 lb

## 2021-12-14 DIAGNOSIS — Z0000 Encounter for general adult medical examination without abnormal findings: Secondary | ICD-10-CM

## 2021-12-14 NOTE — Patient Instructions (Addendum)
Mary Pacheco , Thank you for taking time to come for your Medicare Wellness Visit. I appreciate your ongoing commitment to your health goals. Please review the following plan we discussed and let me know if I can assist you in the future.   These are the goals we discussed:  Goals       Patient Stated     DIET - INCREASE WATER INTAKE (pt-stated)      Other     LIFESTYLE - DECREASE FALLS RISK      Monitor footing, slow the pace to prevent falling        This is a list of the screening recommended for you and due dates:  Health Maintenance  Topic Date Due   Mammogram  07/13/2022   Urine Protein Check  10/02/2022   Tetanus Vaccine  04/06/2024   Pneumonia Vaccine  Completed   Flu Shot  Completed   DEXA scan (bone density measurement)  Completed   COVID-19 Vaccine  Completed   Zoster (Shingles) Vaccine  Completed   HPV Vaccine  Aged Out    Advanced directives: on file  Conditions/risks identified: none new  Follow up in one year for your annual wellness visit   Preventive Care 65 Years and Older, Female Preventive care refers to lifestyle choices and visits with your health care provider that can promote health and wellness. What does preventive care include? A yearly physical exam. This is also called an annual well check. Dental exams once or twice a year. Routine eye exams. Ask your health care provider how often you should have your eyes checked. Personal lifestyle choices, including: Daily care of your teeth and gums. Regular physical activity. Eating a healthy diet. Avoiding tobacco and drug use. Limiting alcohol use. Practicing safe sex. Taking low-dose aspirin every day. Taking vitamin and mineral supplements as recommended by your health care provider. What happens during an annual well check? The services and screenings done by your health care provider during your annual well check will depend on your age, overall health, lifestyle risk factors, and family  history of disease. Counseling  Your health care provider may ask you questions about your: Alcohol use. Tobacco use. Drug use. Emotional well-being. Home and relationship well-being. Sexual activity. Eating habits. History of falls. Memory and ability to understand (cognition). Work and work Statistician. Reproductive health. Screening  You may have the following tests or measurements: Height, weight, and BMI. Blood pressure. Lipid and cholesterol levels. These may be checked every 5 years, or more frequently if you are over 67 years old. Skin check. Lung cancer screening. You may have this screening every year starting at age 42 if you have a 30-pack-year history of smoking and currently smoke or have quit within the past 15 years. Fecal occult blood test (FOBT) of the stool. You may have this test every year starting at age 44. Flexible sigmoidoscopy or colonoscopy. You may have a sigmoidoscopy every 5 years or a colonoscopy every 10 years starting at age 77. Hepatitis C blood test. Hepatitis B blood test. Sexually transmitted disease (STD) testing. Diabetes screening. This is done by checking your blood sugar (glucose) after you have not eaten for a while (fasting). You may have this done every 1-3 years. Bone density scan. This is done to screen for osteoporosis. You may have this done starting at age 67. Mammogram. This may be done every 1-2 years. Talk to your health care provider about how often you should have regular mammograms. Talk with  your health care provider about your test results, treatment options, and if necessary, the need for more tests. Vaccines  Your health care provider may recommend certain vaccines, such as: Influenza vaccine. This is recommended every year. Tetanus, diphtheria, and acellular pertussis (Tdap, Td) vaccine. You may need a Td booster every 10 years. Zoster vaccine. You may need this after age 66. Pneumococcal 13-valent conjugate (PCV13)  vaccine. One dose is recommended after age 19. Pneumococcal polysaccharide (PPSV23) vaccine. One dose is recommended after age 61. Talk to your health care provider about which screenings and vaccines you need and how often you need them. This information is not intended to replace advice given to you by your health care provider. Make sure you discuss any questions you have with your health care provider. Document Released: 11/04/2015 Document Revised: 06/27/2016 Document Reviewed: 08/09/2015 Elsevier Interactive Patient Education  2017 [employer] Prevention in the Home Falls can cause injuries. They can happen to people of all ages. There are many things you can do to make your home safe and to help prevent falls. What can I do on the outside of my home? Regularly fix the edges of walkways and driveways and fix any cracks. Remove anything that might make you trip as you walk through a door, such as a raised step or threshold. Trim any bushes or trees on the path to your home. Use bright outdoor lighting. Clear any walking paths of anything that might make someone trip, such as rocks or tools. Regularly check to see if handrails are loose or broken. Make sure that both sides of any steps have handrails. Any raised decks and porches should have guardrails on the edges. Have any leaves, snow, or ice cleared regularly. Use sand or salt on walking paths during winter. Clean up any spills in your garage right away. This includes oil or grease spills. What can I do in the bathroom? Use night lights. Install grab bars by the toilet and in the tub and shower. Do not use towel bars as grab bars. Use non-skid mats or decals in the tub or shower. If you need to sit down in the shower, use a plastic, non-slip stool. Keep the floor dry. Clean up any water that spills on the floor as soon as it happens. Remove soap buildup in the tub or shower regularly. Attach bath mats securely with  double-sided non-slip rug tape. Do not have throw rugs and other things on the floor that can make you trip. What can I do in the bedroom? Use night lights. Make sure that you have a light by your bed that is easy to reach. Do not use any sheets or blankets that are too big for your bed. They should not hang down onto the floor. Have a firm chair that has side arms. You can use this for support while you get dressed. Do not have throw rugs and other things on the floor that can make you trip. What can I do in the kitchen? Clean up any spills right away. Avoid walking on wet floors. Keep items that you use a lot in easy-to-reach places. If you need to reach something above you, use a strong step stool that has a grab bar. Keep electrical cords out of the way. Do not use floor polish or wax that makes floors slippery. If you must use wax, use non-skid floor wax. Do not have throw rugs and other things on the floor that can make you trip.  What can I do with my stairs? Do not leave any items on the stairs. Make sure that there are handrails on both sides of the stairs and use them. Fix handrails that are broken or loose. Make sure that handrails are as long as the stairways. Check any carpeting to make sure that it is firmly attached to the stairs. Fix any carpet that is loose or worn. Avoid having throw rugs at the top or bottom of the stairs. If you do have throw rugs, attach them to the floor with carpet tape. Make sure that you have a light switch at the top of the stairs and the bottom of the stairs. If you do not have them, ask someone to add them for you. What else can I do to help prevent falls? Wear shoes that: Do not have high heels. Have rubber bottoms. Are comfortable and fit you well. Are closed at the toe. Do not wear sandals. If you use a stepladder: Make sure that it is fully opened. Do not climb a closed stepladder. Make sure that both sides of the stepladder are locked  into place. Ask someone to hold it for you, if possible. Clearly mark and make sure that you can see: Any grab bars or handrails. First and last steps. Where the edge of each step is. Use tools that help you move around (mobility aids) if they are needed. These include: Canes. Walkers. Scooters. Crutches. Turn on the lights when you go into a dark area. Replace any light bulbs as soon as they burn out. Set up your furniture so you have a clear path. Avoid moving your furniture around. If any of your floors are uneven, fix them. If there are any pets around you, be aware of where they are. Review your medicines with your doctor. Some medicines can make you feel dizzy. This can increase your chance of falling. Ask your doctor what other things that you can do to help prevent falls. This information is not intended to replace advice given to you by your health care provider. Make sure you discuss any questions you have with your health care provider. Document Released: 08/04/2009 Document Revised: 03/15/2016 Document Reviewed: 11/12/2014 Elsevier Interactive Patient Education  2017 [employer].

## 2021-12-14 NOTE — Progress Notes (Signed)
Subjective:   Mary Pacheco is a 81 y.o. female who presents for Medicare Annual (Subsequent) preventive examination.  Review of Systems    No ROS.  Medicare Wellness Virtual Visit.  Visual/audio telehealth visit, UTA vital signs.   See social history for additional risk factors.   Cardiac Risk Factors include: advanced age (>72men, >74 women)     Objective:    Today's Vitals   12/14/21 1404  Weight: 140 lb (63.5 kg)  Height: 5\' 3"  (1.6 m)   Body mass index is 24.8 kg/m.  Advanced Directives 12/14/2021 11/09/2020 10/31/2020 05/24/2020 04/19/2020 11/30/2019 08/19/2019  Does Patient Have a Medical Advance Directive? Yes Yes Yes Yes Yes Yes Yes  Type of Paramedic of [place];Living will [place];Living will [place];Living will Living will;Healthcare Power of [place] will [place];Living will  Does patient want to make changes to medical advance directive? No - Patient declined No - Patient declined No - Patient declined - - No - Patient declined No - Patient declined  Copy of [place] in Chart? Yes - validated most recent copy scanned in chart (See row information) No - copy requested No - copy requested - - - No - copy requested    Current Medications (verified) Outpatient Encounter Medications as of 12/14/2021  Medication Sig   acetaminophen (TYLENOL) 500 MG tablet Take 1,000 mg by mouth every 6 (six) hours as needed for moderate pain.   acyclovir (ZOVIRAX) 400 MG tablet TAKE 1 TABLET BY MOUTH EVERY DAY   ALPRAZolam (XANAX) 0.25 MG tablet TAKE 1/2 TABLET (0.125 MG TOTAL) BY MOUTH DAILY AS NEEDED FOR ANXIETY.   amLODipine (NORVASC) 2.5 MG tablet TAKE 1 TABLET BY MOUTH AT BEDTIME. MAY TAKE ADDITIONAL DOSE FOR BLOOD PRESSURE > 135/85.   Apoaequorin 10 MG CAPS Take 1 tablet by mouth daily.    atorvastatin (LIPITOR) 20 MG tablet TAKE 1 TABLET BY MOUTH EVERY DAY   BAYER  ASPIRIN EC LOW DOSE 81 MG EC tablet Take 81 mg by mouth daily.    celecoxib (CELEBREX) 200 MG capsule TAKE 1 CAPSULE BY MOUTH TWICE A DAY   Cholecalciferol (VITAMIN D) 125 MCG (5000 UT) CAPS Take 5,000 Units by mouth daily.   Coenzyme Q10 200 MG capsule Take 200 mg by mouth daily.    CRANBERRY PO Take 650 mg by mouth daily.   ezetimibe (ZETIA) 10 MG tablet TAKE 1 TABLET DAILY   Green Tea, Camellia sinensis, (GREEN TEA PO) Take 500 mg by mouth daily.    hydrochlorothiazide (HYDRODIURIL) 25 MG tablet Take 12.5 mg by mouth daily.   magnesium oxide (MAG-OX) 400 MG tablet Take 400 mg by mouth daily.   Misc Natural Products (TOTAL CARDIO HEALTH FORMULA PO) Take 1 tablet by mouth daily.   Omega-3 Fatty Acids (FISH OIL TRIPLE STRENGTH) 1400 MG CAPS Take 1,400 mg by mouth daily.    pantoprazole (PROTONIX) 40 MG tablet TAKE 1 TABLET DAILY   Probiotic Product (PROBIOTIC DAILY PO) Take 1 capsule by mouth daily.    SYNTHROID 50 MCG tablet TAKE 1 TABLET DAILY   No facility-administered encounter medications on file as of 12/14/2021.    Allergies (verified) Molds & smuts, Rosuvastatin, and Simvastatin   History: Past Medical History:  Diagnosis Date   Anemia    in the past    Anxiety 2011   patient with anxiety will continue Alprazolam prn   Arthritis  Asthma    grew up with asthma, but not as an adult    Carotid artery disease ([place])    GERD (gastroesophageal reflux disease)    pt. with esophagea reflux. She reports great improvement with Dexilant.   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 lipids well controlled   Hypertension    Hypothyroidism    PAD (peripheral artery disease) (H[facility])    Pre-diabetes    Skin cancer    left leg   Past Surgical History:  Procedure Laterality Date   [place]   multiple fibroid cysts   cataract surgery  02/2011   bilateral    COLONOSCOPY WITH PROPOFOL N/A 04/19/2020   Procedure: COLONOSCOPY WITH PROPOFOL;  Surgeon: Virgel Manifold, MD;  Location: [facility] ENDOSCOPY;  Service: Endoscopy;  Laterality: N/A;   EYE SURGERY     JOINT REPLACEMENT     KNEE ARTHROPLASTY Left 08/19/2019   Procedure: COMPUTER ASSISTED TOTAL KNEE ARTHROPLASTY;  Surgeon: Dereck Leep, MD;  Location: [facility] ORS;  Service: Orthopedics;  Laterality: Left;   KNEE ARTHROSCOPY Right 11/09/2020   Procedure: ARTHROSCOPY KNEE;  Surgeon: Dereck Leep, MD;  Location: [facility] ORS;  Service: Orthopedics;  Laterality: Right;  FOLLOWING 2ND CASE   skin cancer removal     TONSILLECTOMY     Family History  Problem Relation Age of Onset   Heart failure Mother    Heart disease Mother        CABG   Hypertension Mother    Hyperlipidemia Mother    Stroke Mother        mini strokes, multiple   Heart attack Father    Heart failure Father    Hyperlipidemia Father    Hypertension Father    Heart attack Sister 69       MI   Hernia Sister    Prostate cancer Brother    Social History   Socioeconomic History   Marital status: Married    Spouse name: [name]   Number of children: Not on file   Years of education: Not on file   Highest education level: Not on file  Occupational History   Occupation: Pharmacist, hospital    Comment: retired  Tobacco Use   Smoking status: Never   Smokeless tobacco: Never  Vaping Use   Vaping Use: Never used  Substance and Sexual Activity   Alcohol use: Yes    Comment: socially   Drug use: No   Sexual activity: Yes    Birth control/protection: Post-menopausal  Other Topics Concern   Not on file  Social History Narrative   From [place] origin[place]      Retired Education officer, museum      Married. Lives with husband and dog.  Patient feels safe in her home   Social Determinants of Health   Financial Resource Strain: Low Risk    Difficulty of Paying Living Expenses: Not hard at all  Food Insecurity: No Food Insecurity    Worried About Charity fundraiser in the Last Year: Never true   [place] in the Last Year: Never true  Transportation Needs: No Transportation Needs   Lack of Transportation (Medical): No   Lack of Transportation (Non-Medical): No  Physical Activity: Insufficiently Active   Days of Exercise per Week: 7 days   Minutes of Exercise per  Session: 20 min  Stress: No Stress Concern Present   Feeling of Stress : Not at all  Social Connections: Unknown   Frequency of Communication with Friends and Family: More than three times a week   Frequency of Social Gatherings with Friends and Family: More than three times a week   Attends Religious Services: Not on Electrical engineer or Organizations: Yes   Attends Archivist Meetings: Not on file   Marital Status: Married    Tobacco Counseling Counseling given: Not Answered   Clinical Intake:  Pre-visit preparation completed: Yes        Diabetes: No  How often do you need to have someone help you when you read instructions, pamphlets, or other written materials from your doctor or pharmacy?: 1 - Never    Interpreter Needed?: No      Activities of Daily Living In your present state of health, do you have any difficulty performing the following activities: 12/14/2021  Hearing? Y  Comment Hearing aids  Vision? N  Difficulty concentrating or making decisions? N  Walking or climbing stairs? Y  Comment Chronic R knee pain  Dressing or bathing? N  Doing errands, shopping? N  Preparing Food and eating ? N  Using the Toilet? N  In the past six months, have you accidently leaked urine? N  Do you have problems with loss of bowel control? N  Managing your Medications? N  Managing your Finances? N  Comment Son assist as needed  Housekeeping or managing your Housekeeping? Y  Comment Maid assist  Some recent data might be hidden    Patient Care Team: Burnard Hawthorne, FNP as PCP - General (Family  Medicine) Rockey Situ, Kathlene November, MD as PCP - Cardiology (Cardiology) Minna Merritts, MD as Consulting Physician (Cardiology) Schnier, Dolores Lory, MD (Vascular Surgery) Margaretha Sheffield, MD (Otolaryngology)  Indicate any recent Medical Services you may have received from other than [facility] providers in the past year (date may be approximate).     Assessment:   This is a routine wellness examination for Jacqeline.  Virtual Visit via Telephone Note  I connected with  NAKIYAH BEVERLEY on 12/14/21 at  2:00 PM EST by telephone and verified that I am speaking with the correct person using two identifiers.  Persons participating in the virtual visit: patient/Nurse Health Advisor   I discussed the limitations, risks, security and privacy concerns of performing an evaluation and management service by telephone and the availability of in person appointments. The patient expressed understanding and agreed to proceed.  Interactive audio and video telecommunications were attempted between this nurse and patient, however failed, due to patient having technical difficulties OR patient did not have access to video capability.  We continued and completed visit with audio only.  Some vital signs may be absent or patient reported.   Hearing/Vision screen Hearing Screening - Comments:: Hearing aids, bilateral Vision Screening - Comments:: [facility])  Cataract extraction, bilateral   Dietary issues and exercise activities discussed: Intensity: Mild Healthy diet   Goals Addressed             This Visit's Progress    LIFESTYLE - DECREASE FALLS RISK       Monitor footing, slow the pace to prevent falling       Depression Screen PHQ 2/9 Scores 12/14/2021 11/30/2020 05/16/2020 11/30/2019 06/05/2019 11/26/2018 11/25/2017  PHQ - 2 Score 0 0 0 0 0 0 0  Fall Risk Fall Risk  12/14/2021 11/30/2020 05/16/2020 02/29/2020 11/30/2019  Falls in the past year? 0 0 1 0 0  Number falls in past yr: 0 0 0 - -   Injury with Fall? - 0 0 - -  Comment - - - - -  Follow up Falls evaluation completed Falls evaluation completed Falls evaluation completed Falls evaluation completed Falls evaluation completed    [place]: Home free of loose throw rugs in walkways, pet beds, electrical cords, etc? Yes  Adequate lighting in your home to reduce risk of falls? Yes   ASSISTIVE DEVICES UTILIZED TO PREVENT FALLS: Life alert? Yes  Use of a cane, walker or w/c? No  Grab bars in the bathroom? Yes  Shower chair or bench in shower? Yes  Elevated toilet seat or a handicapped toilet? Yes   TIMED UP AND GO: Was the test performed? No .   Cognitive Function: MMSE - Mini Mental State Exam 11/25/2017 11/22/2016 03/09/2015  Orientation to time 5 5 5   Orientation to Place 5 5 5   Registration 3 3 3   Attention/ Calculation 5 5 5   Recall 2 3 3   Language- name 2 objects 2 2 2   Language- repeat 1 1 1   Language- follow 3 step command 3 3 3   Language- read & follow direction 1 1 1   Write a sentence 1 1 1   Copy design 1 1 1   Total score 29 30 30      6CIT Screen 12/14/2021 11/30/2020 11/30/2019 11/26/2018  What Year? 0 points 0 points 0 points 0 points  What month? 0 points 0 points 0 points 0 points  What time? 0 points 0 points 0 points 0 points  Count back from 20 0 points 0 points - 0 points  Months in reverse 0 points 0 points 0 points 2 points  Repeat phrase 0 points - - 0 points  Total Score 0 - - 2    Immunizations Immunization History  Administered Date(s) Administered   Fluad Quad(high Dose 65+) 08/22/2021   Influenza Split 07/12/2011, 09/15/2012, 08/06/2014   Influenza, High Dose Seasonal PF 07/18/2016, 07/15/2017, 08/20/2018, 08/03/2019, 08/16/2020   Influenza-Unspecified 07/31/2013, 07/15/2017   PFIZER Comirnaty(Gray Top)Covid-19 Tri-Sucrose Vaccine 11/04/2019, 11/26/2019, 08/22/2020   PFIZER(Purple Top)SARS-COV-2 Vaccination 11/04/2019, 11/26/2019   Pfizer Covid-19  Vaccine Bivalent Booster 56yrs & up 10/12/2021   Pneumococcal Conjugate-13 04/06/2014   Pneumococcal Polysaccharide-23 03/20/2009   Tdap 04/06/2014   Zoster Recombinat (Shingrix) 04/03/2018, 08/07/2018   Zoster, Live 07/25/2013   Screening Tests Health Maintenance  Topic Date Due   MAMMOGRAM  07/13/2022   URINE MICROALBUMIN  10/02/2022   TETANUS/TDAP  04/06/2024   Pneumonia Vaccine 11+ Years old  Completed   INFLUENZA VACCINE  Completed   DEXA SCAN  Completed   COVID-19 Vaccine  Completed   Zoster Vaccines- Shingrix  Completed   HPV VACCINES  Aged Out   Health Maintenance There are no preventive care reminders to display for this patient.  Lung Cancer Screening: (Low Dose CT Chest recommended if Age 41-80 years, 30 pack-year currently smoking OR have quit w/in 15years.) does not qualify.   Hepatitis C Screening: does not qualify  Vision Screening: Recommended annual ophthalmology exams for early detection of glaucoma and other disorders of the eye.  Dental Screening: Recommended annual dental exams for proper oral hygiene  Community Resource Referral / Chronic Care Management: CRR required this visit?  No   CCM required this visit?  No  Plan:   Keep all routine maintenance appointments.   I have personally reviewed and noted the following in the patients chart:   Medical and social history Use of alcohol, tobacco or illicit drugs  Current medications and supplements including opioid prescriptions.  Functional ability and status Nutritional status Physical activity Advanced directives List of other physicians Hospitalizations, surgeries, and ER visits in previous 12 months Vitals Screenings to include cognitive, depression, and falls Referrals and appointments  In addition, I have reviewed and discussed with patient certain preventive protocols, quality metrics, and best practice recommendations. A written personalized care plan for preventive services as  well as general preventive health recommendations were provided to patient.     Varney Biles, LPN   [id number]

## 2021-12-27 ENCOUNTER — Telehealth: Payer: Self-pay | Admitting: Family

## 2021-12-27 NOTE — Telephone Encounter (Signed)
Send refill to [facility] for hydrochlorothiazide (HYDRODIURIL) 25 MG tablet. The patient has been out for 2 weeks. ?

## 2021-12-28 ENCOUNTER — Telehealth: Payer: Self-pay

## 2021-12-28 MED ORDER — HYDROCHLOROTHIAZIDE 25 MG PO TABS: 25.0000 mg | ORAL_TABLET | Freq: Every day | ORAL | 1 refills | Status: DC

## 2022-01-01 NOTE — Telephone Encounter (Signed)
Pt stating she is cutting pills (hydrochlorothiazide) in half because its 2x the strength and its working fine. Pt wants to know if its ok ?

## 2022-01-03 NOTE — Telephone Encounter (Signed)
Call pt ?Likely okay ?Sch f/u in the next few weeks so we can check bp, labs ?

## 2022-01-04 NOTE — Telephone Encounter (Signed)
Spoke to patient  and she stated that she just had questions if she could cut pill in half  to take...and  also scheduled  appointment for 02/17/2022 BP check and labs ?

## 2022-01-17 ENCOUNTER — Ambulatory Visit: Payer: Medicare Other | Admitting: Family

## 2022-01-17 ENCOUNTER — Encounter: Payer: Self-pay | Admitting: Family

## 2022-01-17 ENCOUNTER — Other Ambulatory Visit: Payer: Self-pay

## 2022-01-17 VITALS — BP 138/76 | HR 86 | Temp 97.8°F | Ht 63.0 in | Wt 140.6 lb

## 2022-01-17 DIAGNOSIS — I10 Essential (primary) hypertension: Secondary | ICD-10-CM | POA: Diagnosis not present

## 2022-01-17 DIAGNOSIS — E782 Mixed hyperlipidemia: Secondary | ICD-10-CM

## 2022-01-17 DIAGNOSIS — F419 Anxiety disorder, unspecified: Secondary | ICD-10-CM

## 2022-01-17 DIAGNOSIS — R7303 Prediabetes: Secondary | ICD-10-CM

## 2022-01-17 DIAGNOSIS — E039 Hypothyroidism, unspecified: Secondary | ICD-10-CM

## 2022-01-17 LAB — COMPREHENSIVE METABOLIC PANEL
ALT: 17 U/L (ref 0–35)
AST: 22 U/L (ref 0–37)
Albumin: 5 g/dL (ref 3.5–5.2)
Alkaline Phosphatase: 91 U/L (ref 39–117)
BUN: 20 mg/dL (ref 6–23)
CO2: 26 mEq/L (ref 19–32)
Calcium: 10.1 mg/dL (ref 8.4–10.5)
Chloride: 103 mEq/L (ref 96–112)
Creatinine, Ser: 0.9 mg/dL (ref 0.40–1.20)
GFR: 59.97 mL/min — ABNORMAL LOW (ref >60.00)
Glucose, Bld: 116 mg/dL — ABNORMAL HIGH (ref 70–99)
Potassium: 4.1 mEq/L (ref 3.5–5.1)
Sodium: 140 mEq/L (ref 135–145)
Total Bilirubin: 0.6 mg/dL (ref 0.2–1.2)
Total Protein: 7.6 g/dL (ref 6.0–8.3)

## 2022-01-17 LAB — HEMOGLOBIN A1C: Hgb A1c MFr Bld: 6.1 % (ref 4.6–6.5)

## 2022-01-17 MED ORDER — HYDROCHLOROTHIAZIDE 12.5 MG PO CAPS: 12.5000 mg | ORAL_CAPSULE | Freq: Every day | ORAL | 2 refills | Status: DC

## 2022-01-17 NOTE — Assessment & Plan Note (Signed)
Chronic, stable.  Reviewed blood pressure readings at home which are lower than  in the office today.  We opted for her to continue monitoring blood pressure at home and also to bring in blood pressure machine with her so we can ensure its accuracy prior to any dose escalations of current regimen, particularly as diastolic blood pressure is on the low end / continue amlodipine 2.5 mg,  hydrochlorothiazide 12.5 mg ?

## 2022-01-17 NOTE — Assessment & Plan Note (Addendum)
Chronic, stable.  Continue Xanax 0.25 mg prn; she is using sparingly.  ?

## 2022-01-17 NOTE — Assessment & Plan Note (Signed)
?  Chronic, stable. Recent TSH 1.47.  Continue Synthroid 50 mcg ?

## 2022-01-17 NOTE — Patient Instructions (Signed)
? ?Monitor blood pressure at home and me 5-6 reading on separate days.  ? ?Goal is less than 130/80,  if persistently higher, please make sooner follow up appointment so we can recheck you blood pressure and manage/ adjust medications. ? ?Please bring blood pressure machine with you to your next visit as discussed ? ?Managing Your Hypertension ?Hypertension, also called high blood pressure, is when the force of the blood pressing against the walls of the arteries is too strong. Arteries are blood vessels that carry blood from your heart throughout your body. Hypertension forces the heart to work harder to pump blood and may cause the arteries to become narrow or stiff. ?Understanding blood pressure readings ?Your personal target blood pressure may vary depending on your medical conditions, your age, and other factors. A blood pressure reading includes a higher number over a lower number. Ideally, your blood pressure should be below 120/80. You should know that: ?The first, or top, number is called the systolic pressure. It is a measure of the pressure in your arteries as your heart beats. ?The second, or bottom number, is called the diastolic pressure. It is a measure of the pressure in your arteries as the heart relaxes. ?Blood pressure is classified into four stages. Based on your blood pressure reading, your health care provider may use the following stages to determine what type of treatment you need, if any. Systolic pressure and diastolic pressure are measured in a unit called mmHg. ?Normal ?Systolic pressure: below 182. ?Diastolic pressure: below 80. ?Elevated ?Systolic pressure: 993-716. ?Diastolic pressure: below 80. ?Hypertension stage 1 ?Systolic pressure: 967-893. ?Diastolic pressure: 81-01. ?Hypertension stage 2 ?Systolic pressure: 751 or above. ?Diastolic pressure: 90 or above. ?How can this condition affect me? ?Managing your hypertension is an important responsibility. Over time, hypertension can  damage the arteries and decrease blood flow to important parts of the body, including the brain, heart, and kidneys. Having untreated or uncontrolled hypertension can lead to: ?A heart attack. ?A stroke. ?A weakened blood vessel (aneurysm). ?Heart failure. ?Kidney damage. ?Eye damage. ?Metabolic syndrome. ?Memory and concentration problems. ?Vascular dementia. ?What actions can I take to manage this condition? ?Hypertension can be managed by making lifestyle changes and possibly by taking medicines. Your health care provider will help you make a plan to bring your blood pressure within a normal range. ?Nutrition ? ?Eat a diet that is high in fiber and potassium, and low in salt (sodium), added sugar, and fat. An example eating plan is called the Dietary Approaches to Stop Hypertension (DASH) diet. To eat this way: ?Eat plenty of fresh fruits and vegetables. Try to fill one-half of your plate at each meal with fruits and vegetables. ?Eat whole grains, such as whole-wheat pasta, brown rice, or whole-grain bread. Fill about one-fourth of your plate with whole grains. ?Eat low-fat dairy products. ?Avoid fatty cuts of meat, processed or cured meats, and poultry with skin. Fill about one-fourth of your plate with lean proteins such as fish, chicken without skin, beans, eggs, and tofu. ?Avoid pre-made and processed foods. These tend to be higher in sodium, added sugar, and fat. ?Reduce your daily sodium intake. Most people with hypertension should eat less than 1,500 mg of sodium a day. ?Lifestyle ? ?Work with your health care provider to maintain a healthy body weight or to lose weight. Ask what an ideal weight is for you. ?Get at least 30 minutes of exercise that causes your heart to beat faster (aerobic exercise) most days of the week.  Activities may include walking, swimming, or biking. ?Include exercise to strengthen your muscles (resistance exercise), such as weight lifting, as part of your weekly exercise routine.  Try to do these types of exercises for 30 minutes at least 3 days a week. ?Do not use any products that contain nicotine or tobacco, such as cigarettes, e-cigarettes, and chewing tobacco. If you need help quitting, ask your health care provider. ?Control any long-term (chronic) conditions you have, such as high cholesterol or diabetes. ?Identify your sources of stress and find ways to manage stress. This may include meditation, deep breathing, or making time for fun activities. ?Alcohol use ?Do not drink alcohol if: ?Your health care provider tells you not to drink. ?You are pregnant, may be pregnant, or are planning to become pregnant. ?If you drink alcohol: ?Limit how much you use to: ?0-1 drink a day for women. ?0-2 drinks a day for men. ?Be aware of how much alcohol is in your drink. In the [place] one drink equals one 12 oz bottle of beer (355 mL), one 5 oz glass of wine (148 mL), or one 1? oz glass of hard liquor (44 mL). ?Medicines ?Your health care provider may prescribe medicine if lifestyle changes are not enough to get your blood pressure under control and if: ?Your systolic blood pressure is 130 or higher. ?Your diastolic blood pressure is 80 or higher. ?Take medicines only as told by your health care provider. Follow the directions carefully. Blood pressure medicines must be taken as told by your health care provider. The medicine does not work as well when you skip doses. Skipping doses also puts you at risk for problems. ?Monitoring ?Before you monitor your blood pressure: ?Do not smoke, drink caffeinated beverages, or exercise within 30 minutes before taking a measurement. ?Use the bathroom and empty your bladder (urinate). ?Sit quietly for at least 5 minutes before taking measurements. ?Monitor your blood pressure at home as told by your health care provider. To do this: ?Sit with your back straight and supported. ?Place your feet flat on the floor. Do not cross your legs. ?Support your arm on a flat  surface, such as a table. Make sure your upper arm is at heart level. ?Each time you measure, take two or three readings one minute apart and record the results. ?You may also need to have your blood pressure checked regularly by your health care provider. ?General information ?Talk with your health care provider about your diet, exercise habits, and other lifestyle factors that may be contributing to hypertension. ?Review all the medicines you take with your health care provider because there may be side effects or interactions. ?Keep all visits as told by your health care provider. Your health care provider can help you create and adjust your plan for managing your high blood pressure. ?Where to find more information ?National Heart, Lung, and Blood Institute: https://wilson-eaton.com/ ?American Heart Association: www.heart.org ?Contact a health care provider if: ?You think you are having a reaction to medicines you have taken. ?You have repeated (recurrent) headaches. ?You feel dizzy. ?You have swelling in your ankles. ?You have trouble with your vision. ?Get help right away if: ?You develop a severe headache or confusion. ?You have unusual weakness or numbness, or you feel faint. ?You have severe pain in your chest or abdomen. ?You vomit repeatedly. ?You have trouble breathing. ?These symptoms may represent a serious problem that is an emergency. Do not wait to see if the symptoms will go away. Get medical help right  away. Call your local emergency services (911 in the [place] Do not drive yourself to the hospital. ?Summary ?Hypertension is when the force of blood pumping through your arteries is too strong. If this condition is not controlled, it may put you at risk for serious complications. ?Your personal target blood pressure may vary depending on your medical conditions, your age, and other factors. For most people, a normal blood pressure is less than 120/80. ?Hypertension is managed by lifestyle changes,  medicines, or both. ?Lifestyle changes to help manage hypertension include losing weight, eating a healthy, low-sodium diet, exercising more, stopping smoking, and limiting alcohol. ?This information is not inten

## 2022-01-17 NOTE — Progress Notes (Signed)
? ?Subjective:  ? ? Patient ID: Mary Pacheco, female    DOB: 18-Sep-1941, 81 y.o.   MRN: [id number] ? ?CC: Mary Pacheco is a 81 y.o. female who presents today for follow up.  ? ?HPI: Feels well today.  No new complaints ? ?Hypertension-compliant with amlodipine 2.5 mg,  hydrochlorothiazide 12.5 mg. No cp. Bilateral leg swelling improves on HCTZ. At home 134/50, 106/55.  ? ?Hypothyroidism-compliant with Synthroid 50 mcg ? ?Anxiety-compliant with Xanax 0.25 mg as needed with relief every couple of weeks.  ? ?She continues to follow with cardiology, Dr. Rockey Situ.  Last seen 05/03/2021 for PAD, bilateral carotid artery stenosis.  She is compliant with Lipitor 20 mg ?HISTORY:  ?Past Medical History:  ?Diagnosis Date  ? Anemia   ? in the past   ? Anxiety 2011  ? patient with anxiety will continue Alprazolam prn  ? Arthritis   ? Asthma   ? grew up with asthma, but not as an adult   ? Carotid artery disease ([place])   ? GERD (gastroesophageal reflux disease)   ? pt. with esophagea reflux. She reports great improvement with Dexilant.  ? Hearing loss   ? s/p hearing aids  ? Herpes 02/20/2010  ? gential  ? Hyperlipidemia   ? patient with HL on statins. lipids well controlled  ? Hypertension   ? Hypothyroidism   ? PAD (peripheral artery disease) ([place])   ? Pre-diabetes   ? Skin cancer   ? left leg  ? ?Past Surgical History:  ?Procedure Laterality Date  ? [place]  ? ABDOMINAL HYSTERECTOMY  1993  ? multiple fibroid cysts  ? cataract surgery  02/2011  ? bilateral  ? COLONOSCOPY WITH PROPOFOL N/A 04/19/2020  ? Procedure: COLONOSCOPY WITH PROPOFOL;  Surgeon: Virgel Manifold, MD;  Location: [facility];  Service: Endoscopy;  Laterality: N/A;  ? EYE SURGERY    ? JOINT REPLACEMENT    ? KNEE ARTHROPLASTY Left 08/19/2019  ? Procedure: COMPUTER ASSISTED TOTAL KNEE ARTHROPLASTY;  Surgeon: Dereck Leep, MD;  Location: [facility] ORS;  Service: Orthopedics;  Laterality: Left;  ? KNEE ARTHROSCOPY Right 11/09/2020  ?  Procedure: ARTHROSCOPY KNEE;  Surgeon: Dereck Leep, MD;  Location: [facility] ORS;  Service: Orthopedics;  Laterality: Right;  FOLLOWING 2ND CASE  ? skin cancer removal    ? TONSILLECTOMY    ? ?Family History  ?Problem Relation Age of Onset  ? Heart failure Mother   ? Heart disease Mother   ?     CABG  ? Hypertension Mother   ? Hyperlipidemia Mother   ? Stroke Mother   ?     mini strokes, multiple  ? Heart attack Father   ? Heart failure Father   ? Hyperlipidemia Father   ? Hypertension Father   ? Heart attack Sister 43  ?     MI  ? Hernia Sister   ? Prostate cancer Brother   ? ? ?Allergies: Molds & smuts, Rosuvastatin, and Simvastatin ?Current Outpatient Medications on File Prior to Visit  ?Medication Sig Dispense Refill  ? acetaminophen (TYLENOL) 500 MG tablet Take 1,000 mg by mouth every 6 (six) hours as needed for moderate pain.    ? acyclovir (ZOVIRAX) 400 MG tablet TAKE 1 TABLET BY MOUTH EVERY DAY 90 tablet 2  ? ALPRAZolam (XANAX) 0.25 MG tablet TAKE 1/2 TABLET (0.125 MG TOTAL) BY MOUTH DAILY AS NEEDED FOR ANXIETY. 30 tablet 1  ? amLODipine (NORVASC) 2.5 MG tablet TAKE  1 TABLET BY MOUTH AT BEDTIME. MAY TAKE ADDITIONAL DOSE FOR BLOOD PRESSURE > 135/85. 90 tablet 1  ? Apoaequorin 10 MG CAPS Take 1 tablet by mouth daily.     ? atorvastatin (LIPITOR) 20 MG tablet TAKE 1 TABLET BY MOUTH EVERY DAY 90 tablet 1  ? celecoxib (CELEBREX) 200 MG capsule TAKE 1 CAPSULE BY MOUTH TWICE A DAY 84 capsule 1  ? Cholecalciferol (VITAMIN D) 125 MCG (5000 UT) CAPS Take 5,000 Units by mouth daily.    ? Coenzyme Q10 200 MG capsule Take 200 mg by mouth daily.     ? CRANBERRY PO Take 650 mg by mouth daily.    ? ezetimibe (ZETIA) 10 MG tablet TAKE 1 TABLET DAILY 90 tablet 2  ? Green Tea, Camellia sinensis, (GREEN TEA PO) Take 500 mg by mouth daily.     ? magnesium oxide (MAG-OX) 400 MG tablet Take 400 mg by mouth daily.    ? Misc Natural Products (TOTAL CARDIO HEALTH FORMULA PO) Take 1 tablet by mouth daily.    ? Omega-3 Fatty Acids (FISH  OIL TRIPLE STRENGTH) 1400 MG CAPS Take 1,400 mg by mouth daily.     ? pantoprazole (PROTONIX) 40 MG tablet TAKE 1 TABLET DAILY 90 tablet 0  ? Probiotic Product (PROBIOTIC DAILY PO) Take 1 capsule by mouth daily.     ? SYNTHROID 50 MCG tablet TAKE 1 TABLET DAILY 90 tablet 3  ? ?No current facility-administered medications on file prior to visit.  ? ? ?Social History  ? ?Tobacco Use  ? Smoking status: Never  ? Smokeless tobacco: Never  ?Vaping Use  ? Vaping Use: Never used  ?Substance Use Topics  ? Alcohol use: Yes  ?  Comment: socially  ? Drug use: No  ? ? ?Review of Systems  ?Constitutional:  Negative for chills and fever.  ?Respiratory:  Negative for cough.   ?Cardiovascular:  Negative for chest pain and palpitations.  ?Gastrointestinal:  Negative for nausea and vomiting.  ?   ?Objective:  ?  ?BP 138/76 (BP Location: Left Arm, Patient Position: Sitting, Cuff Size: Normal)   Pulse 86   Temp 97.8 ?F (36.6 ?C) (Oral)   Ht '5\' 3"'$  (1.6 m)   Wt 140 lb 9.6 oz (63.8 kg)   SpO2 96%   BMI 24.91 kg/m?  ?BP Readings from Last 3 Encounters:  ?01/17/22 138/76  ?10/02/21 136/60  ?05/03/21 140/60  ? ?Wt Readings from Last 3 Encounters:  ?01/17/22 140 lb 9.6 oz (63.8 kg)  ?12/14/21 140 lb (63.5 kg)  ?10/02/21 140 lb 3.2 oz (63.6 kg)  ? ? ?Physical Exam ?Vitals reviewed.  ?Constitutional:   ?   Appearance: She is well-developed.  ?Eyes:  ?   Conjunctiva/sclera: Conjunctivae normal.  ?Cardiovascular:  ?   Rate and Rhythm: Normal rate and regular rhythm.  ?   Pulses: Normal pulses.  ?   Heart sounds: Normal heart sounds.  ?Pulmonary:  ?   Effort: Pulmonary effort is normal.  ?   Breath sounds: Normal breath sounds. No wheezing, rhonchi or rales.  ?Skin: ?   General: Skin is warm and dry.  ?Neurological:  ?   Mental Status: She is alert.  ?Psychiatric:     ?   Speech: Speech normal.     ?   Behavior: Behavior normal.     ?   Thought Content: Thought content normal.  ? ? ?   ?Assessment & Plan:  ? ?Problem List Items Addressed This  Visit   ? ?  ?  Cardiovascular and Mediastinum  ? Essential hypertension - Primary  ?  Chronic, stable.  Reviewed blood pressure readings at home which are lower than  in the office today.  We opted for her to continue monitoring blood pressure at home and also to bring in blood pressure machine with her so we can ensure its accuracy prior to any dose escalations of current regimen, particularly as diastolic blood pressure is on the low end / continue amlodipine 2.5 mg,  hydrochlorothiazide 12.5 mg ?  ?  ? Relevant Medications  ? hydrochlorothiazide (MICROZIDE) 12.5 MG capsule  ? Other Relevant Orders  ? Comprehensive metabolic panel  ? Hemoglobin A1c  ?  ? Endocrine  ? Hypothyroidism  ?   ?Chronic, stable. Recent TSH 1.47.  Continue Synthroid 50 mcg ?  ?  ?  ? Other  ? Anxiety  ?  Chronic, stable.  Continue Xanax 0.25 mg prn; she is using sparingly.  ?  ?  ? Hyperlipidemia, unspecified  ? Relevant Medications  ? hydrochlorothiazide (MICROZIDE) 12.5 MG capsule  ? Prediabetes  ? ? ? ?I have discontinued [name]. Bayer "Judy"'s Bayer Aspirin EC Low Dose and hydrochlorothiazide. I am also having her start on hydrochlorothiazide. Additionally, I am having her maintain her Misc Natural Products ([place]), Vitamin D, Fish Oil Triple Strength, Coenzyme Q10, Probiotic Product (PROBIOTIC DAILY PO), (Green Tea, Camellia sinensis, (GREEN TEA PO)), acetaminophen, CRANBERRY PO, Apoaequorin, magnesium oxide, ALPRAZolam, celecoxib, Synthroid, ezetimibe, acyclovir, pantoprazole, atorvastatin, and amLODipine. ? ? ?Meds ordered this encounter  ?Medications  ? hydrochlorothiazide (MICROZIDE) 12.5 MG capsule  ?  Sig: Take 1 capsule (12.5 mg total) by mouth daily.  ?  Dispense:  90 capsule  ?  Refill:  2  ?  Order Specific Question:   Supervising Provider  ?  Answer:   Crecencio Mc [[id number]]  ? ? ?Return precautions given.  ? ?Risks, benefits, and alternatives of the medications and treatment plan prescribed today  were discussed, and patient expressed understanding.  ? ?Education regarding symptom management and diagnosis given to patient on AVS. ? ?Continue to follow with Burnard Hawthorne, FNP for routine health m

## 2022-01-19 ENCOUNTER — Telehealth: Payer: Self-pay | Admitting: Family

## 2022-03-27 ENCOUNTER — Encounter: Payer: Self-pay | Admitting: Family

## 2022-03-28 ENCOUNTER — Other Ambulatory Visit: Payer: Self-pay | Admitting: Family

## 2022-03-28 ENCOUNTER — Ambulatory Visit: Payer: Medicare Other | Admitting: Family

## 2022-03-28 ENCOUNTER — Other Ambulatory Visit: Payer: Self-pay | Admitting: Cardiovascular Disease

## 2022-04-02 ENCOUNTER — Ambulatory Visit: Payer: Medicare Other | Admitting: Family

## 2022-04-16 ENCOUNTER — Other Ambulatory Visit: Payer: Self-pay | Admitting: Family

## 2022-04-19 ENCOUNTER — Other Ambulatory Visit: Payer: Self-pay | Admitting: Family

## 2022-04-19 DIAGNOSIS — F419 Anxiety disorder, unspecified: Secondary | ICD-10-CM

## 2022-04-20 NOTE — Telephone Encounter (Signed)
Refilled: 02/21/2021 Last OV: 01/17/2022 Next OV: 06/04/2022

## 2022-04-25 LAB — HM DIABETES EYE EXAM

## 2022-04-26 ENCOUNTER — Encounter (INDEPENDENT_AMBULATORY_CARE_PROVIDER_SITE_OTHER): Payer: Medicare Other

## 2022-04-26 ENCOUNTER — Ambulatory Visit (INDEPENDENT_AMBULATORY_CARE_PROVIDER_SITE_OTHER): Payer: Medicare Other | Admitting: Vascular Surgery

## 2022-04-27 NOTE — Progress Notes (Signed)
abstract

## 2022-04-30 ENCOUNTER — Ambulatory Visit: Payer: Medicare Other | Admitting: Cardiovascular Disease

## 2022-05-02 NOTE — Progress Notes (Signed)
MRN : [id number]  Mary Pacheco is a 81 y.o. ([birth year]) female who presents with chief complaint of check carotid arteries.  History of Present Illness:   The patient is seen for follow up evaluation of carotid stenosis. The carotid stenosis followed by ultrasound.   The patient denies amaurosis fugax. There is no recent history of TIA symptoms or focal motor deficits. There is no prior documented CVA.   The patient is taking enteric-coated aspirin 81 mg daily.   There is no history of migraine headaches. There is no history of seizures.   The patient has a history of coronary artery disease, no recent episodes of angina or shortness of breath. The patient denies PAD or claudication symptoms. There is a history of hyperlipidemia which is being treated with a statin.     Carotid Duplex done today shows 78-93% RICA and <81% LICA.  No significant change in the stenosis of the right compared to last study in 04/27/2021, velocities were about the same.  No outpatient medications have been marked as taking for the 05/03/22 encounter (Appointment) with Delana Meyer, Dolores Lory, MD.    Past Medical History:  Diagnosis Date   Anemia    in the past    Anxiety 2011   patient with anxiety will continue Alprazolam prn   Arthritis    Asthma    grew up with asthma, but not as an adult    Carotid artery disease ([place])    GERD (gastroesophageal reflux disease)    pt. with esophagea reflux. She reports great improvement with Dexilant.   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 lipids well controlled   Hypertension    Hypothyroidism    PAD (peripheral artery disease) (HCC)    Pre-diabetes    Skin cancer    left leg    Past Surgical History:  Procedure Laterality Date   [place]   multiple fibroid cysts   cataract surgery  02/2011   bilateral   COLONOSCOPY  WITH PROPOFOL N/A 04/19/2020   Procedure: COLONOSCOPY WITH PROPOFOL;  Surgeon: Virgel Manifold, MD;  Location: [facility];  Service: Endoscopy;  Laterality: N/A;   EYE SURGERY     JOINT REPLACEMENT     KNEE ARTHROPLASTY Left 08/19/2019   Procedure: COMPUTER ASSISTED TOTAL KNEE ARTHROPLASTY;  Surgeon: Dereck Leep, MD;  Location: [facility];  Service: Orthopedics;  Laterality: Left;   KNEE ARTHROSCOPY Right 11/09/2020   Procedure: ARTHROSCOPY KNEE;  Surgeon: Dereck Leep, MD;  Location: [facility];  Service: Orthopedics;  Laterality: Right;  FOLLOWING 2ND CASE   skin cancer removal     TONSILLECTOMY      Social History Social History   Tobacco Use   Smoking status: Never   Smokeless tobacco: Never  Vaping Use   Vaping Use: Never used  Substance Use Topics   Alcohol use: Yes    Comment: socially   Drug use: No    Family History Family History  Problem Relation Age of Onset   Heart failure Mother    Heart disease Mother        CABG   Hypertension Mother    Hyperlipidemia Mother    Stroke Mother        mini strokes, multiple   Heart attack Father    Heart failure  Father    Hyperlipidemia Father    Hypertension Father    Heart attack Sister 81       MI   Hernia Sister    Prostate cancer Brother     Allergies  Allergen Reactions   Molds & Smuts Other (See Comments)    Induces asthma Induces asthma   Rosuvastatin Hives   Simvastatin Other (See Comments)    myalgia     REVIEW OF SYSTEMS (Negative unless checked)  Constitutional: '[]'$ Weight loss  '[]'$ Fever  '[]'$ Chills Cardiac: '[]'$ Chest pain   '[]'$ Chest pressure   '[]'$ Palpitations   '[]'$ Shortness of breath when laying flat   '[]'$ Shortness of breath with exertion. Vascular:  '[x]'$ Pain in legs with walking   '[]'$ Pain in legs at rest  '[]'$ History of DVT   '[]'$ Phlebitis   '[]'$ Swelling in legs   '[]'$ Varicose veins   '[]'$ Non-healing ulcers Pulmonary:   '[]'$ Uses home oxygen   '[]'$ Productive cough   '[]'$ Hemoptysis   '[]'$ Wheeze  '[]'$ COPD    '[]'$ Asthma Neurologic:  '[]'$ Dizziness   '[]'$ Seizures   '[]'$ History of stroke   '[]'$ History of TIA  '[]'$ Aphasia   '[]'$ Vissual changes   '[]'$ Weakness or numbness in arm   '[]'$ Weakness or numbness in leg Musculoskeletal:   '[]'$ Joint swelling   '[x]'$ Joint pain   '[]'$ Low back pain Hematologic:  '[]'$ Easy bruising  '[]'$ Easy bleeding   '[]'$ Hypercoagulable state   '[]'$ Anemic Gastrointestinal:  '[]'$ Diarrhea   '[]'$ Vomiting  '[x]'$ Gastroesophageal reflux/heartburn   '[]'$ Difficulty swallowing. Genitourinary:  '[]'$ Chronic kidney disease   '[]'$ Difficult urination  '[]'$ Frequent urination   '[]'$ Blood in urine Skin:  '[]'$ Rashes   '[]'$ Ulcers  Psychological:  '[]'$ History of anxiety   '[]'$  History of major depression.  Physical Examination  There were no vitals filed for this visit. There is no height or weight on file to calculate BMI. Gen: WD/WN, NAD Head: [place]/AT, No temporalis wasting.  Ear/Nose/Throat: Hearing grossly intact, nares w/o erythema or drainage Eyes: PER, EOMI, sclera nonicteric.  Neck: Supple, no masses.  No bruit or JVD.  Pulmonary:  Good air movement, no audible wheezing, no use of accessory muscles.  Cardiac: RRR, normal S1, S2, no Murmurs. Vascular:  carotid bruit noted Vessel Right Left  Radial Palpable Palpable  Carotid  Palpable  Palpable  Subclav  Palpable Palpable  Gastrointestinal: soft, non-distended. No guarding/no peritoneal signs.  Musculoskeletal: M/S 5/5 throughout.  No visible deformity.  Neurologic: CN 2-12 intact. Pain and light touch intact in extremities.  Symmetrical.  Speech is fluent. Motor exam as listed above. Psychiatric: Judgment intact, Mood & affect appropriate for pt's clinical situation. Dermatologic: No rashes or ulcers noted.  No changes consistent with cellulitis.   CBC Lab Results  Component Value Date   WBC 6.2 03/25/2020   HGB 12.0 03/25/2020   HCT 36.7 03/25/2020   MCV 87.1 03/25/2020   PLT 316.0 03/25/2020    BMET    Component Value Date/Time   NA 140 01/17/2022 0956   NA 140 12/28/2011 1819    K 4.1 01/17/2022 0956   K 3.2 (L) 12/28/2011 1819   CL 103 01/17/2022 0956   CL 104 12/28/2011 1819   CO2 26 01/17/2022 0956   CO2 27 12/28/2011 1819   GLUCOSE 116 (H) 01/17/2022 0956   GLUCOSE 98 12/28/2011 1819   BUN 20 01/17/2022 0956   BUN 13 12/28/2011 1819   CREATININE 0.90 01/17/2022 0956   CREATININE 1.00 11/06/2013 0854   CALCIUM 10.1 01/17/2022 0956   CALCIUM 9.1 12/28/2011 1819   GFRNONAA >60 08/11/2019 1142   GFRNONAA  56 (L) 11/06/2013 0854   GFRAA >60 08/11/2019 1142   GFRAA >60 11/06/2013 0854   CrCl cannot be calculated (Patient's most recent lab result is older than the maximum 21 days allowed.).  COAG Lab Results  Component Value Date   INR 1.0 08/11/2019    Radiology No results found.   Assessment/Plan 1. Bilateral carotid artery stenosis Recommend:   Given the patient's asymptomatic subcritical stenosis no further invasive testing or surgery at this time.   Duplex ultrasound shows 77-82% RICA and <42% LICA stenosis bilaterally.   Continue antiplatelet therapy as prescribed Continue management of CAD, HTN and Hyperlipidemia Healthy heart diet,  encouraged exercise at least 4 times per week.   Follow up in 12 months with duplex ultrasound and physical - VAS US CAROTID - VAS US CAROTID; Future  2. PAD (peripheral artery disease) (HCC)  Recommend:   The patient has evidence of atherosclerosis of the lower extremities with claudication.  The patient does not voice lifestyle limiting changes at this point in time.   Noninvasive studies do not suggest clinically significant change.   No invasive studies, angiography or surgery at this time The patient should continue walking and begin a more formal exercise program.  The patient should continue antiplatelet therapy and aggressive treatment of the lipid abnormalities   No changes in the patient's medications at this time   The patient should continue wearing graduated compression socks 10-15  mmHg strength to control the mild edema.    3. Essential hypertension Continue antihypertensive medications as already ordered, these medications have been reviewed and there are no changes at this time.   4. Gastroesophageal reflux disease, unspecified whether esophagitis present Continue PPI as already ordered, this medication has been reviewed and there are no changes at this time.  Avoidence of caffeine and alcohol  Moderate elevation of the head of the bed    5. Mixed hyperlipidemia Continue statin as ordered and reviewed, no changes at this time     Hortencia Pilar, MD  05/02/2022 5:22 PM

## 2022-05-03 ENCOUNTER — Ambulatory Visit (INDEPENDENT_AMBULATORY_CARE_PROVIDER_SITE_OTHER): Payer: Medicare Other | Admitting: Vascular Surgery

## 2022-05-03 ENCOUNTER — Ambulatory Visit (INDEPENDENT_AMBULATORY_CARE_PROVIDER_SITE_OTHER): Payer: Medicare Other

## 2022-05-03 ENCOUNTER — Encounter (INDEPENDENT_AMBULATORY_CARE_PROVIDER_SITE_OTHER): Payer: Self-pay | Admitting: Vascular Surgery

## 2022-05-03 ENCOUNTER — Other Ambulatory Visit (INDEPENDENT_AMBULATORY_CARE_PROVIDER_SITE_OTHER): Payer: Self-pay | Admitting: Vascular Surgery

## 2022-05-03 VITALS — BP 167/66 | HR 67 | Resp 16 | Ht 63.0 in | Wt 136.0 lb

## 2022-05-03 DIAGNOSIS — E782 Mixed hyperlipidemia: Secondary | ICD-10-CM

## 2022-05-03 DIAGNOSIS — I10 Essential (primary) hypertension: Secondary | ICD-10-CM

## 2022-05-03 DIAGNOSIS — K219 Gastro-esophageal reflux disease without esophagitis: Secondary | ICD-10-CM

## 2022-05-03 DIAGNOSIS — I6523 Occlusion and stenosis of bilateral carotid arteries: Secondary | ICD-10-CM

## 2022-05-03 DIAGNOSIS — I739 Peripheral vascular disease, unspecified: Secondary | ICD-10-CM

## 2022-05-03 NOTE — Progress Notes (Signed)
Date:  05/04/2022   ID:  [name], DOB 01/27/1941, MRN [id number]  Patient Location:  [place] [id number]   Provider location:   [facility], [place] office  PCP:  Burnard Hawthorne, FNP  Cardiologist:  Ida Rogue, MD   Chief Complaint  Patient presents with   Other    12 Month f/u c/o elevate BP feels like she is talking in a barrel. Meds reviewed verbally with pt.    History of Present Illness:    Mary Pacheco is a 81 y.o. female PMH of  hypertension,  hyperlipidemia,  bilateral carotid arterial disease,  Ultrasound March 2021 Right Carotid: Velocities in the right ICA are consistent with a low-end  60-79% stenosis. Less than 39% disease on the left anxiety  Labile hypertension presenting for routine followup of her PAD/carotid disease, hyperlipidemia  Last seen by myself in clinic July 2022 BP running high today 741 systolic, repeat systolic 287 Recently At home: 86-767 systolic On avg 209-470  On amloidpine 2.5 in the pm, hctz 12.5 in Am  Stress ahead, traveling with her husband down to [place] He has underlying medical issue  A1C 6.1 Total chokl 143, LDL 48 Tolerating Lipitor Zetia  Carotid u/s performed yesterday: Stable, 60% on right  EKG personally reviewed by myself on todays visit NSR rate 78 bpm no ST or T wave changes   Lab Results  Component Value Date   CHOL 137 03/22/2021   HDL 76.80 03/22/2021   LDLCALC 46 03/22/2021   TRIG 74.0 03/22/2021    Past Medical History:  Diagnosis Date   Anemia    in the past    Anxiety 2011   patient with anxiety will continue Alprazolam prn   Arthritis    Asthma    grew up with asthma, but not as an adult    Carotid artery disease ([place])    GERD (gastroesophageal reflux disease)    pt. with esophagea reflux. She reports great improvement with Dexilant.   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 lipids well controlled   Hypertension    Hypothyroidism    PAD (peripheral artery disease) (HCC)    Pre-diabetes    Skin cancer    left leg   Past Surgical History:  Procedure Laterality Date   [place]   multiple fibroid cysts   cataract surgery  02/2011   bilateral   COLONOSCOPY WITH PROPOFOL N/A 04/19/2020   Procedure: COLONOSCOPY WITH PROPOFOL;  Surgeon: Virgel Manifold, MD;  Location: [facility];  Service: Endoscopy;  Laterality: N/A;   EYE SURGERY     JOINT REPLACEMENT     KNEE ARTHROPLASTY Left 08/19/2019   Procedure: COMPUTER ASSISTED TOTAL KNEE ARTHROPLASTY;  Surgeon: Dereck Leep, MD;  Location: [facility];  Service: Orthopedics;  Laterality: Left;   KNEE ARTHROSCOPY Right 11/09/2020   Procedure: ARTHROSCOPY KNEE;  Surgeon: Dereck Leep, MD;  Location: [facility];  Service: Orthopedics;  Laterality: Right;  FOLLOWING 2ND CASE   skin cancer removal     TONSILLECTOMY       Current Meds  Medication Sig   acetaminophen (TYLENOL) 500 MG tablet Take 1,000 mg by mouth every 6 (six) hours as needed for moderate pain.   acyclovir (ZOVIRAX) 400 MG tablet TAKE  1 TABLET BY MOUTH EVERY DAY   ALPRAZolam (XANAX) 0.25 MG tablet TAKE 1/2 TABLET (0.125 MG TOTAL) BY MOUTH DAILY AS NEEDED FOR ANXIETY.   amLODipine (NORVASC) 2.5 MG tablet TAKE 1 TABLET BY MOUTH AT BEDTIME. MAY TAKE ADDITIONAL DOSE FOR BLOOD PRESSURE > 135/85.   Apoaequorin 10 MG CAPS Take 1 tablet by mouth daily.    atorvastatin (LIPITOR) 20 MG tablet TAKE 1 TABLET BY MOUTH EVERY DAY   celecoxib (CELEBREX) 200 MG capsule TAKE 1 CAPSULE BY MOUTH TWICE A DAY   Cholecalciferol (VITAMIN D) 125 MCG (5000 UT) CAPS Take 5,000 Units by mouth daily.   Coenzyme Q10 200 MG capsule Take 200 mg by mouth daily.    CRANBERRY PO Take 650 mg by mouth daily.   Green Tea, Camellia sinensis, (GREEN TEA PO) Take 500 mg by mouth daily.    hydrochlorothiazide  (MICROZIDE) 12.5 MG capsule Take 1 capsule (12.5 mg total) by mouth daily.   Misc Natural Products (TOTAL CARDIO HEALTH FORMULA PO) Take 1 tablet by mouth daily.   Omega-3 Fatty Acids (FISH OIL TRIPLE STRENGTH) 1400 MG CAPS Take 1,400 mg by mouth daily.    pantoprazole (PROTONIX) 40 MG tablet TAKE 1 TABLET DAILY   Probiotic Product (PROBIOTIC DAILY PO) Take 1 capsule by mouth daily.    SYNTHROID 50 MCG tablet TAKE 1 TABLET DAILY   [DISCONTINUED] ezetimibe (ZETIA) 10 MG tablet TAKE 1 TABLET DAILY     Allergies:   Molds & smuts, Rosuvastatin, and Simvastatin   Social History   Tobacco Use   Smoking status: Never   Smokeless tobacco: Never  Vaping Use   Vaping Use: Never used  Substance Use Topics   Alcohol use: Yes    Comment: socially   Drug use: No     Family Hx: The patient's family history includes Heart attack in her father; Heart attack (age of onset: 56) in her sister; Heart disease in her mother; Heart failure in her father and mother; Hernia in her sister; Hyperlipidemia in her father and mother; Hypertension in her father and mother; Prostate cancer in her brother; Stroke in her mother.  ROS:   Please see the history of present illness.    Review of Systems  Constitutional: Negative.   HENT: Negative.    Respiratory: Negative.    Cardiovascular: Negative.   Gastrointestinal: Negative.   Musculoskeletal:  Positive for joint pain.  Neurological: Negative.   Psychiatric/Behavioral: Negative.    All other systems reviewed and are negative.   Labs/Other Tests and Data Reviewed:    Recent Labs: 10/02/2021: TSH 1.47 01/17/2022: ALT 17; BUN 20; Creatinine, Ser 0.90; Potassium 4.1; Sodium 140   Recent Lipid Panel Lab Results  Component Value Date/Time   CHOL 137 03/22/2021 08:49 AM   TRIG 74.0 03/22/2021 08:49 AM   HDL 76.80 03/22/2021 08:49 AM   CHOLHDL 2 03/22/2021 08:49 AM   LDLCALC 46 03/22/2021 08:49 AM    Wt Readings from Last 3 Encounters:  05/04/22 138  lb 2 oz (62.7 kg)  05/03/22 136 lb (61.7 kg)  01/17/22 140 lb 9.6 oz (63.8 kg)     Exam:   BP (!) 155/60 (BP Location: Left Arm, Patient Position: Sitting, Cuff Size: Normal)   Pulse 78   Ht '5\' 3"'$  (1.6 m)   Wt 138 lb 2 oz (62.7 kg)   SpO2 97%   BMI 24.47 kg/m  Constitutional:  oriented to person, place, and time. No distress.  HENT:  Head: Grossly  normal Eyes:  no discharge. No scleral icterus.  Neck: No JVD, no carotid bruits  Cardiovascular: Regular rate and rhythm, no murmurs appreciated Pulmonary/Chest: Clear to auscultation bilaterally, no wheezes or rails Abdominal: Soft.  no distension.  no tenderness.  Musculoskeletal: Normal range of motion Neurological:  normal muscle tone. Coordination normal. No atrophy Skin: Skin warm and dry Psychiatric: normal affect, pleasant  ASSESSMENT & PLAN:    PAD (peripheral artery disease) (HCC) carotid ultrasound  reviewed: stable Zetia , Lipitor, LDL at goal  Bilateral carotid artery stenosis Ultrasound 04/2022 60% stenosis on the right Less than 39% disease on the left  Mixed hyperlipidemia Lipitor with Zetia, refills Cholesterol is at goal on the current lipid regimen. No changes to the medications were made.  Essential hypertension Somewhat labile On avg 436-067 systolic Rare low and high, encouraged fluid  Prediabetes Eating out more A1C 6.1   Total encounter time more than 30- minutes  Greater than 50% was spent in counseling and coordination of care with the patient    Signed, Ida Rogue, MD  05/04/2022 3:18 PM    [place] [facility] [address], [place], [place] [address]

## 2022-05-04 ENCOUNTER — Ambulatory Visit: Payer: Medicare Other | Admitting: Cardiovascular Disease

## 2022-05-04 ENCOUNTER — Encounter (INDEPENDENT_AMBULATORY_CARE_PROVIDER_SITE_OTHER): Payer: Self-pay | Admitting: Vascular Surgery

## 2022-05-04 ENCOUNTER — Encounter: Payer: Self-pay | Admitting: Cardiovascular Disease

## 2022-05-04 VITALS — BP 155/60 | HR 78 | Ht 63.0 in | Wt 138.1 lb

## 2022-05-04 DIAGNOSIS — I10 Essential (primary) hypertension: Secondary | ICD-10-CM | POA: Diagnosis not present

## 2022-05-04 DIAGNOSIS — I951 Orthostatic hypotension: Secondary | ICD-10-CM | POA: Diagnosis not present

## 2022-05-04 DIAGNOSIS — E782 Mixed hyperlipidemia: Secondary | ICD-10-CM

## 2022-05-04 DIAGNOSIS — I739 Peripheral vascular disease, unspecified: Secondary | ICD-10-CM | POA: Diagnosis not present

## 2022-05-04 DIAGNOSIS — I6523 Occlusion and stenosis of bilateral carotid arteries: Secondary | ICD-10-CM | POA: Diagnosis not present

## 2022-05-04 MED ORDER — EZETIMIBE 10 MG PO TABS: 10.0000 mg | ORAL_TABLET | Freq: Every day | ORAL | 3 refills | Status: AC

## 2022-05-04 NOTE — Patient Instructions (Signed)
Medication Instructions:  No changes  If you need a refill on your cardiac medications before your next appointment, please call your pharmacy.   Lab work: No new labs needed  Testing/Procedures: No new testing needed  Follow-Up: At [facility], you and your health needs are our priority.  As part of our continuing mission to provide you with exceptional heart care, we have created designated Provider Care Teams.  These Care Teams include your primary Cardiologist (physician) and Advanced Practice Providers (APPs -  Physician Assistants and Nurse Practitioners) who all work together to provide you with the care you need, when you need it.  You will need a follow up appointment in 12 months  Providers on your designated Care Team:   Christopher Berge, NP Ryan Dunn, PA-C Cadence Furth, PA-C  COVID-19 Vaccine Information can be found at: [email or web address] For questions related to vaccine distribution or appointments, please email [email or web address] or call [phone].   

## 2022-05-06 DIAGNOSIS — M1711 Unilateral primary osteoarthritis, right knee: Secondary | ICD-10-CM | POA: Insufficient documentation

## 2022-05-24 ENCOUNTER — Other Ambulatory Visit: Payer: Self-pay | Admitting: Family

## 2022-06-03 ENCOUNTER — Other Ambulatory Visit: Payer: Self-pay | Admitting: Family

## 2022-06-03 DIAGNOSIS — I10 Essential (primary) hypertension: Secondary | ICD-10-CM

## 2022-06-04 ENCOUNTER — Encounter: Payer: Self-pay | Admitting: Family

## 2022-06-04 ENCOUNTER — Ambulatory Visit: Payer: Medicare Other | Admitting: Family

## 2022-06-04 VITALS — BP 138/78 | HR 78 | Temp 97.8°F | Ht 64.0 in | Wt 135.6 lb

## 2022-06-04 DIAGNOSIS — I10 Essential (primary) hypertension: Secondary | ICD-10-CM

## 2022-06-04 DIAGNOSIS — I6523 Occlusion and stenosis of bilateral carotid arteries: Secondary | ICD-10-CM | POA: Diagnosis not present

## 2022-06-04 DIAGNOSIS — E039 Hypothyroidism, unspecified: Secondary | ICD-10-CM

## 2022-06-04 DIAGNOSIS — I739 Peripheral vascular disease, unspecified: Secondary | ICD-10-CM | POA: Diagnosis not present

## 2022-06-04 LAB — POCT GLYCOSYLATED HEMOGLOBIN (HGB A1C): Hemoglobin A1C: 5.9 % — AB (ref 4.0–5.6)

## 2022-06-04 NOTE — Assessment & Plan Note (Signed)
Lab Results  Component Value Date   TSH 1.47 10/02/2021   Chronic, euthyroid.  Continue Synthroid 50 mcg

## 2022-06-04 NOTE — Assessment & Plan Note (Addendum)
Blood pressure readings at home 125/60.  As diastolic blood pressure historically on low end and readings at home acceptable, will continue amlodipine 2.5 mg, hydrochlorothiazide 12.5 mg

## 2022-06-04 NOTE — Progress Notes (Signed)
Discussed during OV. Please see OV notes

## 2022-06-04 NOTE — Progress Notes (Signed)
Subjective:    Patient ID: Mary Pacheco, female    DOB: January 16, 1941, 81 y.o.   MRN: [id number]  CC: Mary Pacheco is a 81 y.o. female who presents today for follow up.   HPI: Feels well today BP at home 125/60. She has been stressed today and feels affecting her bp.  She also reports being in the doctor's office affects her blood pressure    Hypertension-compliant with amlodipine 2.5 mg, hydrochlorothiazide 12.5 mg. No cp, sob   Hypothyroidism-compliant with Synthroid 50 mcg   follow-up Dr. Rockey Situ 05/04/2022 for peripheral artery disease, hyperlipidemia, hypertension.  No changes made at that time. She continues to follow with vascular, Dr. Delana Meyer.  Last seen 05/03/2022.  Repeat carotid ultrasound in 12 months time HISTORY:  Past Medical History:  Diagnosis Date   Anemia    in the past    Anxiety 2011   patient with anxiety will continue Alprazolam prn   Arthritis    Asthma    grew up with asthma, but not as an adult    Carotid artery disease ([place])    GERD (gastroesophageal reflux disease)    pt. with esophagea reflux. She reports great improvement with Dexilant.   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 lipids well controlled   Hypertension    Hypothyroidism    PAD (peripheral artery disease) (HCC)    Pre-diabetes    Skin cancer    left leg   Past Surgical History:  Procedure Laterality Date   Ricardo   multiple fibroid cysts   cataract surgery  02/2011   bilateral   COLONOSCOPY WITH PROPOFOL N/A 04/19/2020   Procedure: COLONOSCOPY WITH PROPOFOL;  Surgeon: Virgel Manifold, MD;  Location: [facility];  Service: Endoscopy;  Laterality: N/A;   EYE SURGERY     JOINT REPLACEMENT     KNEE ARTHROPLASTY Left 08/19/2019   Procedure: COMPUTER ASSISTED TOTAL KNEE ARTHROPLASTY;  Surgeon: Dereck Leep, MD;  Location: [facility];   Service: Orthopedics;  Laterality: Left;   KNEE ARTHROSCOPY Right 11/09/2020   Procedure: ARTHROSCOPY KNEE;  Surgeon: Dereck Leep, MD;  Location: [facility];  Service: Orthopedics;  Laterality: Right;  FOLLOWING 2ND CASE   skin cancer removal     TONSILLECTOMY     Family History  Problem Relation Age of Onset   Heart failure Mother    Heart disease Mother        CABG   Hypertension Mother    Hyperlipidemia Mother    Stroke Mother        mini strokes, multiple   Heart attack Father    Heart failure Father    Hyperlipidemia Father    Hypertension Father    Heart attack Sister 4       MI   Hernia Sister    Prostate cancer Brother     Allergies: Molds & smuts, Rosuvastatin, and Simvastatin Current Outpatient Medications on File Prior to Visit  Medication Sig Dispense Refill   acetaminophen (TYLENOL) 500 MG tablet Take 1,000 mg by mouth every 6 (six) hours as needed for moderate pain.     acyclovir (ZOVIRAX) 400 MG tablet TAKE 1 TABLET BY MOUTH EVERY DAY 90 tablet 2   ALPRAZolam (XANAX) 0.25 MG tablet TAKE 1/2 TABLET (0.125 MG TOTAL) BY MOUTH DAILY AS NEEDED FOR  ANXIETY. 30 tablet 1   amLODipine (NORVASC) 2.5 MG tablet TAKE 1 TABLET BY MOUTH AT BEDTIME. MAY TAKE ADDITIONAL DOSE FOR BLOOD PRESSURE > 135/85. 90 tablet 1   Apoaequorin 10 MG CAPS Take 1 tablet by mouth daily.      atorvastatin (LIPITOR) 20 MG tablet TAKE 1 TABLET BY MOUTH EVERY DAY 90 tablet 1   celecoxib (CELEBREX) 200 MG capsule TAKE 1 CAPSULE BY MOUTH TWICE A DAY 84 capsule 1   Cholecalciferol (VITAMIN D) 125 MCG (5000 UT) CAPS Take 5,000 Units by mouth daily.     Coenzyme Q10 200 MG capsule Take 200 mg by mouth daily.      CRANBERRY PO Take 650 mg by mouth daily.     ezetimibe (ZETIA) 10 MG tablet Take 1 tablet (10 mg total) by mouth daily. 90 tablet 3   Green Tea, Camellia sinensis, (GREEN TEA PO) Take 500 mg by mouth daily.      hydrochlorothiazide (MICROZIDE) 12.5 MG capsule Take 1 capsule (12.5 mg total) by  mouth daily. 90 capsule 2   Misc Natural Products (TOTAL CARDIO HEALTH FORMULA PO) Take 1 tablet by mouth daily.     Omega-3 Fatty Acids (FISH OIL TRIPLE STRENGTH) 1400 MG CAPS Take 1,400 mg by mouth daily.      pantoprazole (PROTONIX) 40 MG tablet TAKE 1 TABLET DAILY 90 tablet 0   Probiotic Product (PROBIOTIC DAILY PO) Take 1 capsule by mouth daily.      SYNTHROID 50 MCG tablet TAKE 1 TABLET DAILY 90 tablet 3   No current facility-administered medications on file prior to visit.    Social History   Tobacco Use   Smoking status: Never   Smokeless tobacco: Never  Vaping Use   Vaping Use: Never used  Substance Use Topics   Alcohol use: Yes    Comment: socially   Drug use: No    Review of Systems  Constitutional:  Negative for chills and fever.  Respiratory:  Negative for cough.   Cardiovascular:  Negative for chest pain and palpitations.  Gastrointestinal:  Negative for nausea and vomiting.      Objective:    BP (!) 150/80 (BP Location: Left Arm, Patient Position: Sitting, Cuff Size: Normal)   Pulse 78   Temp 97.8 F (36.6 C) (Oral)   Ht '5\' 4"'$  (1.626 m)   Wt 135 lb 9.6 oz (61.5 kg)   SpO2 98%   BMI 23.28 kg/m  BP Readings from Last 3 Encounters:  06/04/22 (!) 150/80  05/04/22 (!) 155/60  05/03/22 (!) 167/66   Wt Readings from Last 3 Encounters:  06/04/22 135 lb 9.6 oz (61.5 kg)  05/04/22 138 lb 2 oz (62.7 kg)  05/03/22 136 lb (61.7 kg)    Physical Exam Vitals reviewed.  Constitutional:      Appearance: She is well-developed.  Eyes:     Conjunctiva/sclera: Conjunctivae normal.  Cardiovascular:     Rate and Rhythm: Normal rate and regular rhythm.     Pulses: Normal pulses.     Heart sounds: Normal heart sounds.  Pulmonary:     [place]: Pulmonary effort is normal.     Breath sounds: Normal breath sounds. No wheezing, rhonchi or rales.  Skin:    General: Skin is warm and dry.  Neurological:     Mental Status: She is alert.  Psychiatric:        Speech:  Speech normal.        Behavior: Behavior normal.  Thought Content: Thought content normal.        Assessment & Plan:   Problem List Items Addressed This Visit       Cardiovascular and Mediastinum   Carotid stenosis   Relevant Orders   Lipid panel   Essential hypertension - Primary    Blood pressure readings at home 125/60.  As diastolic blood pressure historically on low end and readings at home acceptable, will continue amlodipine 2.5 mg, hydrochlorothiazide 12.5 mg      Relevant Orders   POCT HgB A1C (Completed)   Basic metabolic panel   PAD (peripheral artery disease) ([place])     Endocrine   Hypothyroidism    Lab Results  Component Value Date   TSH 1.47 10/02/2021  Chronic, euthyroid.  Continue Synthroid 50 mcg        I am having [name]. Dunagan "Bethena Roys" maintain her Misc Natural Products (TOTAL CARDIO HEALTH FORMULA PO), Vitamin D, Fish Oil Triple Strength, Coenzyme Q10, Probiotic Product (PROBIOTIC DAILY PO), (Green Tea, Camellia sinensis, (GREEN TEA PO)), acetaminophen, CRANBERRY PO, Apoaequorin, celecoxib, hydrochlorothiazide, pantoprazole, atorvastatin, ALPRAZolam, ezetimibe, Synthroid, acyclovir, and amLODipine.   No orders of the defined types were placed in this encounter.   Return precautions given.   Risks, benefits, and alternatives of the medications and treatment plan prescribed today were discussed, and patient expressed understanding.   Education regarding symptom management and diagnosis given to patient on AVS.  Continue to follow with Burnard Hawthorne, FNP for routine health maintenance.   [name] and I agreed with plan.   Mable Paris, FNP

## 2022-06-04 NOTE — Progress Notes (Signed)
5.9 

## 2022-06-05 LAB — BASIC METABOLIC PANEL
BUN: 24 mg/dL — ABNORMAL HIGH (ref 6–23)
CO2: 25 mEq/L (ref 19–32)
Calcium: 10.2 mg/dL (ref 8.4–10.5)
Chloride: 99 mEq/L (ref 96–112)
Creatinine, Ser: 0.97 mg/dL (ref 0.40–1.20)
GFR: 54.67 mL/min — ABNORMAL LOW (ref >60.00)
Glucose, Bld: 91 mg/dL (ref 70–99)
Potassium: 3.8 mEq/L (ref 3.5–5.1)
Sodium: 138 mEq/L (ref 135–145)

## 2022-06-05 LAB — LIPID PANEL
Cholesterol: 169 mg/dL (ref 0–200)
HDL: 77.1 mg/dL (ref >39.00)
LDL Cholesterol: 66 mg/dL (ref 0–99)
NonHDL: 91.84
Total CHOL/HDL Ratio: 2
Triglycerides: 131 mg/dL (ref 0.0–149.0)
VLDL: 26.2 mg/dL (ref 0.0–40.0)

## 2022-07-18 ENCOUNTER — Encounter: Payer: Self-pay | Admitting: Family

## 2022-07-23 ENCOUNTER — Other Ambulatory Visit: Payer: Self-pay | Admitting: Family

## 2022-08-20 ENCOUNTER — Encounter (INDEPENDENT_AMBULATORY_CARE_PROVIDER_SITE_OTHER): Payer: Self-pay

## 2022-08-21 NOTE — Discharge Instructions (Signed)
Instructions after Total Knee Replacement   Rajni Holsworth P. Takahiro Godinho, [name], M.D.     Dept. of Orthopaedics & Sports Medicine  [facility]  [address]  [place], [place]  [address]  Phone: [phone]   Fax: [phone]    DIET: Drink plenty of non-alcoholic fluids. Resume your normal diet. Include foods high in fiber.  ACTIVITY:  You may use crutches or a walker with weight-bearing as tolerated, unless instructed otherwise. You may be weaned off of the walker or crutches by your Physical Therapist.  Do NOT place pillows under the knee. Anything placed under the knee could limit your ability to straighten the knee.   Continue doing gentle exercises. Exercising will reduce the pain and swelling, increase motion, and prevent muscle weakness.   Please continue to use the TED compression stockings for 6 weeks. You may remove the stockings at night, but should reapply them in the morning. Do not drive or operate any equipment until instructed.  WOUND CARE:  Continue to use the PolarCare or ice packs periodically to reduce pain and swelling. You may bathe or shower after the staples are removed at the first office visit following surgery.  MEDICATIONS: You may resume your regular medications. Please take the pain medication as prescribed on the medication. Do not take pain medication on an empty stomach. You have been given a prescription for a blood thinner (Lovenox or Coumadin). Please take the medication as instructed. (NOTE: After completing a 2 week course of Lovenox, take one Enteric-coated aspirin once a day. This along with elevation will help reduce the possibility of phlebitis in your operated leg.) Do not drive or drink alcoholic beverages when taking pain medications.  CALL THE OFFICE FOR: Temperature above 101 degrees Excessive bleeding or drainage on the dressing. Excessive swelling, coldness, or paleness of the toes. Persistent nausea and vomiting.  FOLLOW-UP:  You  should have an appointment to return to the office in 10-14 days after surgery. Arrangements have been made for continuation of Physical Therapy (either home therapy or outpatient therapy).   [facility] Department Directory         [email or web address]       [email or web address]          Cardiology  Appointments: [place] - [phone] [place] - [phone]  Endocrinology  Appointments: [place] - [phone] [place] - [phone]  Gastroenterology  Appointments: [place] - [phone] [place] - [phone]        General Surgery   Appointments: [place] - [phone]  Internal Medicine/Family Medicine  Appointments: [place] - [phone] [place] - [phone] [place] - [phone]  Metabolic and Weigh Loss Surgery  Appointments: [place] - [phone]        Neurology  Appointments: [place] - [phone] [place] - [phone]  Neurosurgery  Appointments: [place] - [phone]  Obstetrics & Gynecology  Appointments: [place] - [phone] [place] - [phone]        Pediatrics  Appointments: [place] - [phone] [place] - [phone]  Physiatry  Appointments: [place] -[phone]  Physical Therapy  Appointments: [place] - [phone] [place] - [phone]        Podiatry  Appointments: [place] - [phone] [place] - [phone]  Pulmonology  Appointments: [place] - [phone]  Rheumatology  Appointments: [place] - [phone]         Location: [facility]  [address] , [place]  [address]  [place] Location: [facility] [address] [place], [place]  [address]  [place] Location: [facility] [address] [place], [place]  [address]    

## 2022-08-22 ENCOUNTER — Encounter
Admission: RE | Admit: 2022-08-22 | Discharge: 2022-08-22 | Disposition: A | Discharge status: Home / Self Care | Payer: Medicare Other | Source: Ambulatory Visit | Attending: Orthopedic Surgery | Admitting: Orthopedic Surgery

## 2022-08-22 ENCOUNTER — Other Ambulatory Visit: Payer: Self-pay

## 2022-08-22 VITALS — BP 152/51 | HR 70 | Resp 12 | Ht 63.0 in | Wt 136.0 lb

## 2022-08-22 DIAGNOSIS — Z01818 Encounter for other preprocedural examination: Secondary | ICD-10-CM | POA: Insufficient documentation

## 2022-08-22 DIAGNOSIS — I951 Orthostatic hypotension: Secondary | ICD-10-CM | POA: Diagnosis not present

## 2022-08-22 DIAGNOSIS — E039 Hypothyroidism, unspecified: Secondary | ICD-10-CM | POA: Diagnosis not present

## 2022-08-22 DIAGNOSIS — D649 Anemia, unspecified: Secondary | ICD-10-CM | POA: Diagnosis not present

## 2022-08-22 DIAGNOSIS — M1711 Unilateral primary osteoarthritis, right knee: Secondary | ICD-10-CM | POA: Insufficient documentation

## 2022-08-22 DIAGNOSIS — E782 Mixed hyperlipidemia: Secondary | ICD-10-CM | POA: Diagnosis not present

## 2022-08-22 DIAGNOSIS — R7303 Prediabetes: Secondary | ICD-10-CM | POA: Insufficient documentation

## 2022-08-22 HISTORY — DX: Personal history of other medical treatment: Z92.89

## 2022-08-22 LAB — URINALYSIS, ROUTINE W REFLEX MICROSCOPIC
Bilirubin Urine: NEGATIVE
Glucose, UA: NEGATIVE mg/dL
Hgb urine dipstick: NEGATIVE
Ketones, ur: NEGATIVE mg/dL
Leukocytes,Ua: NEGATIVE
Nitrite: NEGATIVE
Protein, ur: NEGATIVE mg/dL
Specific Gravity, Urine: 1.015 (ref 1.005–1.030)
pH: 5 (ref 5.0–8.0)

## 2022-08-22 LAB — C-REACTIVE PROTEIN: CRP: 0.5 mg/dL (ref <1.0)

## 2022-08-22 LAB — CBC
HCT: 40.7 % (ref 36.0–46.0)
Hemoglobin: 13.1 g/dL (ref 12.0–15.0)
MCH: 29.6 pg (ref 26.0–34.0)
MCHC: 32.2 g/dL (ref 30.0–36.0)
MCV: 92.1 fL (ref 80.0–100.0)
Platelets: 297 10*3/uL (ref 150–400)
RBC: 4.42 MIL/uL (ref 3.87–5.11)
RDW: 13.6 % (ref 11.5–15.5)
WBC: 6.1 10*3/uL (ref 4.0–10.5)
nRBC: 0 % (ref 0.0–0.2)

## 2022-08-22 LAB — TYPE AND SCREEN
ABO/RH(D): A NEG
Antibody Screen: NEGATIVE

## 2022-08-22 LAB — COMPREHENSIVE METABOLIC PANEL
ALT: 18 U/L (ref 0–44)
AST: 22 U/L (ref 15–41)
Albumin: 4.8 g/dL (ref 3.5–5.0)
Alkaline Phosphatase: 74 U/L (ref 38–126)
Anion gap: 7 (ref 5–15)
BUN: 23 mg/dL (ref 8–23)
CO2: 26 mmol/L (ref 22–32)
Calcium: 9.8 mg/dL (ref 8.9–10.3)
Chloride: 104 mmol/L (ref 98–111)
Creatinine, Ser: 0.89 mg/dL (ref 0.44–1.00)
GFR, Estimated: >60 mL/min (ref >60)
Glucose, Bld: 113 mg/dL — ABNORMAL HIGH (ref 70–99)
Potassium: 3.7 mmol/L (ref 3.5–5.1)
Sodium: 137 mmol/L (ref 135–145)
Total Bilirubin: 0.7 mg/dL (ref 0.3–1.2)
Total Protein: 7.8 g/dL (ref 6.5–8.1)

## 2022-08-22 LAB — SURGICAL PCR SCREEN
MRSA, PCR: NEGATIVE
Staphylococcus aureus: NEGATIVE

## 2022-08-22 LAB — SEDIMENTATION RATE: Sed Rate: 6 mm/hr (ref 0–30)

## 2022-08-22 NOTE — Patient Instructions (Signed)
Your procedure is scheduled on: 09/03/22 Report to [place]. To find out your arrival time please call [phone] between 1PM - 3PM on 08/31/22.  Remember: Instructions that are not followed completely may result in serious medical risk, up to and including death, or upon the discretion of your surgeon and anesthesiologist your surgery may need to be rescheduled.     _X__ 1. Do not eat food after midnight the night before your procedure.                 No gum chewing or hard candies. You may drink clear liquids up to 2 hours                 before you are scheduled to arrive for your surgery- DO not drink clear                 liquids within 2 hours of the start of your surgery.                 Clear Liquids include:  water, apple juice without pulp, clear carbohydrate                 drink such as Clearfast or Gatorade, Black Coffee or Tea (Do not add                 anything to coffee or tea). Diabetics water only  Finish the Ensure "clear" pre surgery drink 2 hours before arriving for surgery  __X__2.  On the morning of surgery brush your teeth with toothpaste and water, you                 may rinse your mouth with mouthwash if you wish.  Do not swallow any              toothpaste of mouthwash.     _X__ 3.  No Alcohol for 24 hours before or after surgery.   _X__ 4.  Do Not Smoke or use e-cigarettes For 24 Hours Prior to Your Surgery.                 Do not use any chewable tobacco products for at least 6 hours prior to                 surgery.  ____  5.  Bring all medications with you on the day of surgery if instructed.   __X__  6.  Notify your doctor if there is any change in your medical condition      (cold, fever, infections).     Do not wear jewelry, make-up, hairpins, clips or nail polish or toenail polish. Do not wear lotions, powders, or perfumes. You may wear deodorant.  Do not shave body hair 48 hours prior to  surgery. Men may shave face and neck. Do not bring valuables to the hospital.    [facility] is not responsible for any belongings or valuables.  Contacts, dentures/partials or body piercings may not be worn into surgery. Bring a case for your contacts, glasses or hearing aids, a denture cup will be supplied. Leave your suitcase in the car. After surgery it may be brought to your room. For patients admitted to the hospital, discharge time is determined by your treatment team.   Patients discharged the day of surgery will not be allowed to drive home.   Please read over the following fact sheets that  you were given:   MRSA Information, CHG soap, Incentive Spirometer, Ensure drink  __X__ Take these medicines the morning of surgery with A SIP OF WATER:    1. pantoprazole (PROTONIX) 40 MG tablet  2. SYNTHROID 50 MCG tablet  3. If you need your alprazolam you mat take it  4.  5.  6.  ____ Fleet Enema (as directed)   __X__ Use CHG Soap/SAGE wipes as directed  ____ Use inhalers on the day of surgery  ____ Stop metformin/Janumet/Farxiga 2 days prior to surgery    ____ Take 1/2 of usual insulin dose the night before surgery. No insulin the morning          of surgery.   ____ Stop Blood Thinners Coumadin/Plavix/Xarelto/Pleta/Pradaxa/Eliquis/Effient/Aspirin  on   Or contact your Surgeon, Cardiologist or Medical Doctor regarding  ability to stop your blood thinners  __X__ Stop Anti-inflammatories 7 days before surgery such as Advil, Ibuprofen, Motrin,  BC or Goodies Powder, Naprosyn, Naproxen, Aleve, Aspirin   You may continue taking your Tylenol  __X__ Stop all herbals and supplements, fish oil or vitamins for 7 days until after surgery.    ____ Bring C-Pap to the hospital.

## 2022-09-02 MED ORDER — LACTATED RINGERS IV SOLN: INTRAVENOUS | Status: DC

## 2022-09-02 MED ORDER — CELECOXIB 200 MG PO CAPS: 400.0000 mg | ORAL_CAPSULE | Freq: Once | ORAL | Status: AC

## 2022-09-02 MED ORDER — CHLORHEXIDINE GLUCONATE 4 % EX LIQD: 60.0000 mL | Freq: Once | CUTANEOUS | Status: DC

## 2022-09-02 MED ORDER — ORAL CARE MOUTH RINSE: 15.0000 mL | Freq: Once | OROMUCOSAL | Status: AC

## 2022-09-02 MED ORDER — GABAPENTIN 300 MG PO CAPS: 300.0000 mg | ORAL_CAPSULE | Freq: Once | ORAL | Status: AC

## 2022-09-02 MED ORDER — DEXAMETHASONE SODIUM PHOSPHATE 10 MG/ML IJ SOLN: 8.0000 mg | Freq: Once | INTRAMUSCULAR | Status: AC

## 2022-09-02 MED ORDER — CHLORHEXIDINE GLUCONATE 0.12 % MT SOLN: 15.0000 mL | Freq: Once | OROMUCOSAL | Status: AC

## 2022-09-02 MED ORDER — CEFAZOLIN SODIUM-DEXTROSE 2-4 GM/100ML-% IV SOLN
2.0000 g | INTRAVENOUS | Status: AC
  Administered 2022-09-03: 2 g via INTRAVENOUS

## 2022-09-02 MED ORDER — TRANEXAMIC ACID-NACL 1000-0.7 MG/100ML-% IV SOLN
1000.0000 mg | INTRAVENOUS | Status: AC
  Administered 2022-09-03: 1000 mg via INTRAVENOUS

## 2022-09-02 NOTE — H&P (Signed)
ORTHOPAEDIC HISTORY & PHYSICAL Regino Bellow, PA - 08/29/2022 10:45 AM EST Formatting of this note is different from the original. Images from the original note were not included. Chief Complaint Chief Complaint Patient presents with Knee Pain H & P RIGHT KNEE  Reason for Visit Mary Pacheco is a 81 y.o. who presents today presents today for a history and physical. She is to undergo a right total knee arthroplasty on 09/03/2022. Since she was last here her condition has not improved. She states the pain continues to interfere with the quality of her life and she wishes to proceed with knee surgery.  She has a long history of right knee pain but apparently had an acute exacerbation of the pain in the last several months. She localizes most of the pain along the lateral aspect of the knee. She reports some swelling, no locking, and some giving way of the knee. She complains that the knee is "stiff" and painful at night. The pain is aggravated by any weight bearing. The patient appreciated some improvement following an intraarticular corticosteroid injection in May. Voltaren gel also provides some relief of the pain. She is not using any ambulatory aids.  Past Medical History Past Medical History: Diagnosis Date Anxiety Arthritis Bilateral carotid artery stenosis Gastroesophageal reflux disease Genital herpes Hearing loss Hyperlipidemia Hypertension Thyroid disease  Past Surgical History Past Surgical History: Procedure Laterality Date Left knee arthroscopy, lateral meniscectomy, and lateral chondroplasty 03/03/2010 Dr. Marry Guan COLONOSCOPY W/BIOPSY N/A 05/18/2014 Procedure: SCREENING COLONOSCOPY; Surgeon: Colvin Caroli, MD; Location: [facility]; Service: Gastroenterology; Laterality: N/A; Left total knee arthroplasty using computer-assisted navigation 08/19/2019 Dr Marry Guan Right knee arthroscopy, partial lateral meniscectomy, and chondroplasty 11/09/2020 Dr  Marry Guan CATARACT EXTRACTION Bilateral 2012 [place] HYSTERECTOMY TOTAL ABDOMINAL W/REMOVAL TUBES &/OR OVARIES 1993  Past Family History Family History Problem Relation Age of Onset Diabetes type II Mother Heart disease Father Anesthesia problems Neg Hx  Medications Current Outpatient Medications Ordered in Epic Medication Sig Dispense Refill acyclovir (ZOVIRAX) 400 MG tablet Take 1 tablet by mouth once daily ALPRAZolam (XANAX) 0.25 MG tablet TAKE 1 TABLET BY MOUTH 3 TIMES A DAY AS NEEDED FOR ANXIETY amLODIPine (NORVASC) 2.5 MG tablet Take 2.5 mg by mouth once daily amoxicillin (AMOXIL) 500 MG capsule TAKE 4 CAPSULES BY MOUTH 1 HOUR PRIOR TO DENTAL TREATMENT apoaequorin (PREVAGEN) capsule Take 1 capsule by mouth once daily aspirin 81 MG EC tablet Take 81 mg by mouth once daily. atorvastatin (LIPITOR) 20 MG tablet Take 20 mg by mouth nightly celecoxib (CELEBREX) 200 MG capsule Take 1 capsule (200 mg total) by mouth once daily cholecalciferol (VITAMIN D3) 1,000 unit capsule Take 1,000 Units by mouth once daily. clobetasoL (TEMOVATE) 0.05 % cream 1(ONE) APPLICATION(S) TOPICAL 2(TWO) TIMES A DAY COQ10, UBIQUINOL, ORAL Take 1 capsule by mouth once daily. cranberry 400 mg Cap Take 1 capsule by mouth once daily. diclofenac (VOLTAREN) 1 % topical gel Apply 2 g topically 4 (four) times daily ezetimibe (ZETIA) 10 mg tablet Take 10 mg by mouth once daily green tea leaf extract Cap Take 1 capsule by mouth once daily. hydrochlorothiazide (MICROZIDE) 12.5 mg capsule Take 1 capsule by mouth once daily levothyroxine (SYNTHROID, LEVOTHROID) 50 MCG tablet TAKE 1 TABLET DAILY omega-3 fatty acids/fish oil 340-1,000 mg capsule Take 1 capsule by mouth once daily. pantoprazole (PROTONIX) 40 MG DR tablet Take 40 mg by mouth every other day SACCHAROMYCES BOULARDII (PROBIOTIC, S.BOULARDII, ORAL) Take 1 capsule by mouth once daily  No current Epic-ordered facility-administered  medications on file.  Allergies Allergies Allergen Reactions Mold Shortness Of Breath Induces asthma  Crestor [Rosuvastatin] Hives   Review of Systems A comprehensive 14 point ROS was performed, reviewed, and the pertinent orthopaedic findings are documented in the HPI.  Exam BP (!) 150/70 (BP Location: Left upper arm, Patient Position: Sitting, BP Cuff Size: Adult)  Ht 162.6 cm ('5\' 4"'$ )  Wt 61 kg (134 lb 6.4 oz)  LMP (LMP Unknown) Comment: Hysterectomy  BMI 23.07 kg/m  General: Well-developed well-nourished female seen in no acute distress.  HEENT: Atraumatic,normocephalic. Pupils are equal and reactive to light. Oropharynx is clear with moist mucosa  Lungs: Clear to auscultation bilaterally  Cardiovascular: Regular rate and rhythm. Normal S1, S2. No murmurs. No appreciable gallops or rubs. Peripheral pulses are palpable.  Abdomen: Soft, non-tender, nondistended. Bowel sounds present  Extremity: Right Knee: Soft tissue swelling: minimal Effusion: none Erythema: none Crepitance: mild Tenderness: lateral Alignment: relative valgus Mediolateral laxity: lateral pseudolaxity Posterior sag: negative Patellar tracking: Good tracking without evidence of subluxation or tilt Atrophy: No significant atrophy. Quadriceps tone was fair to good. Range of motion: 0/6/124 degrees  Neurological:  The patient is alert and oriented Sensation to light touch appears to be intact and within normal limits Gross motor strength appeared to be equal to 5/5  Vascular :  Peripheral pulses felt to be palpable. Capillary refill appears to be intact and within normal limits  X-ray  1. AP standing, lateral and sunrise view of the right knee ordered and interpreted on today's visit shows bone-on-bone to the lateral compartment with some erosion into the tibial plateau. There is irregularity at the tibial plateau and lateral epicondyle. As far formation as well as subchondral sclerosis  is noted.  Impression  1. Degenerative arthrosis right knee  Plan  1. Patient's medication was going over on today's visit. She is to discontinue her aspirin and omega-3 fatty acid as of today. 2. Patient was instructed postop rehab course 3. Past medical history was gone over. 4. Return to clinic postop as scheduled  This note was generated in part with voice recognition software and I apologize for any typographical errors that were not detected and corrected   Watt Climes PA Electronically signed by Regino Bellow, PA at 08/29/2022 11:54 AM EST

## 2022-09-03 ENCOUNTER — Ambulatory Visit: Payer: Medicare Other | Admitting: Urgent Care

## 2022-09-03 ENCOUNTER — Other Ambulatory Visit: Payer: Self-pay

## 2022-09-03 ENCOUNTER — Encounter
Admission: RE | Disposition: A | Discharge status: Home / Self Care | Payer: Self-pay | Source: Home / Self Care | Attending: Orthopedic Surgery

## 2022-09-03 ENCOUNTER — Observation Stay: Payer: Medicare Other

## 2022-09-03 ENCOUNTER — Encounter: Payer: Self-pay | Admitting: Orthopedic Surgery

## 2022-09-03 ENCOUNTER — Ambulatory Visit: Payer: Medicare Other | Admitting: Anesthesiology

## 2022-09-03 ENCOUNTER — Observation Stay
Admission: RE | Admit: 2022-09-03 | Discharge: 2022-09-04 | Disposition: A | Discharge status: Home / Self Care | Payer: Medicare Other | Attending: Orthopedic Surgery | Admitting: Orthopedic Surgery

## 2022-09-03 DIAGNOSIS — R7303 Prediabetes: Secondary | ICD-10-CM

## 2022-09-03 DIAGNOSIS — E782 Mixed hyperlipidemia: Secondary | ICD-10-CM

## 2022-09-03 DIAGNOSIS — Z85828 Personal history of other malignant neoplasm of skin: Secondary | ICD-10-CM | POA: Insufficient documentation

## 2022-09-03 DIAGNOSIS — J45909 Unspecified asthma, uncomplicated: Secondary | ICD-10-CM | POA: Diagnosis not present

## 2022-09-03 DIAGNOSIS — M1711 Unilateral primary osteoarthritis, right knee: Principal | ICD-10-CM | POA: Insufficient documentation

## 2022-09-03 DIAGNOSIS — E039 Hypothyroidism, unspecified: Secondary | ICD-10-CM

## 2022-09-03 DIAGNOSIS — D649 Anemia, unspecified: Secondary | ICD-10-CM

## 2022-09-03 DIAGNOSIS — I10 Essential (primary) hypertension: Secondary | ICD-10-CM | POA: Diagnosis not present

## 2022-09-03 DIAGNOSIS — Z79899 Other long term (current) drug therapy: Secondary | ICD-10-CM | POA: Diagnosis not present

## 2022-09-03 DIAGNOSIS — I951 Orthostatic hypotension: Secondary | ICD-10-CM

## 2022-09-03 DIAGNOSIS — Z7982 Long term (current) use of aspirin: Secondary | ICD-10-CM | POA: Insufficient documentation

## 2022-09-03 DIAGNOSIS — Z96659 Presence of unspecified artificial knee joint: Secondary | ICD-10-CM

## 2022-09-03 HISTORY — PX: KNEE ARTHROPLASTY: SHX992

## 2022-09-03 SURGERY — ARTHROPLASTY, KNEE, TOTAL, USING IMAGELESS COMPUTER-ASSISTED NAVIGATION
Anesthesia: Spinal | Site: Knee | Laterality: Right

## 2022-09-03 MED ORDER — ATORVASTATIN CALCIUM 20 MG PO TABS
20.0000 mg | ORAL_TABLET | Freq: Every evening | ORAL | Status: DC
  Administered 2022-09-03: 20 mg via ORAL
  Filled 2022-09-03: qty 1

## 2022-09-03 MED ORDER — PROPOFOL 500 MG/50ML IV EMUL
INTRAVENOUS | Status: DC | PRN
  Administered 2022-09-03: 150 ug/kg/min via INTRAVENOUS

## 2022-09-03 MED ORDER — HYDRALAZINE HCL 20 MG/ML IJ SOLN
INTRAMUSCULAR | Status: AC
  Filled 2022-09-03: qty 1

## 2022-09-03 MED ORDER — DEXAMETHASONE SODIUM PHOSPHATE 10 MG/ML IJ SOLN
INTRAMUSCULAR | Status: AC
  Administered 2022-09-03: 8 mg via INTRAVENOUS
  Filled 2022-09-03: qty 1

## 2022-09-03 MED ORDER — HYDROCHLOROTHIAZIDE 12.5 MG PO TABS
12.5000 mg | ORAL_TABLET | Freq: Every day | ORAL | Status: DC
  Administered 2022-09-03 – 2022-09-04 (×2): 12.5 mg via ORAL
  Filled 2022-09-03 (×2): qty 1

## 2022-09-03 MED ORDER — CELECOXIB 200 MG PO CAPS
ORAL_CAPSULE | ORAL | Status: AC
  Administered 2022-09-03: 400 mg via ORAL
  Filled 2022-09-03: qty 2

## 2022-09-03 MED ORDER — HYDROMORPHONE HCL 1 MG/ML IJ SOLN: 0.5000 mg | INTRAMUSCULAR | Status: DC | PRN

## 2022-09-03 MED ORDER — LEVOTHYROXINE SODIUM 50 MCG PO TABS
50.0000 ug | ORAL_TABLET | Freq: Every day | ORAL | Status: DC
  Administered 2022-09-04: 50 ug via ORAL
  Filled 2022-09-03: qty 1

## 2022-09-03 MED ORDER — ENSURE PRE-SURGERY PO LIQD: 296.0000 mL | Freq: Once | ORAL | Status: DC

## 2022-09-03 MED ORDER — VITAMIN D 25 MCG (1000 UNIT) PO TABS
5000.0000 [IU] | ORAL_TABLET | Freq: Every day | ORAL | Status: DC
  Administered 2022-09-04: 5000 [IU] via ORAL
  Filled 2022-09-03 (×2): qty 5

## 2022-09-03 MED ORDER — SODIUM CHLORIDE 0.9 % IR SOLN
Status: DC | PRN
  Administered 2022-09-03: 3000 mL

## 2022-09-03 MED ORDER — OXYCODONE HCL 5 MG PO TABS: 5.0000 mg | ORAL_TABLET | Freq: Once | ORAL | Status: DC | PRN

## 2022-09-03 MED ORDER — PHENOL 1.4 % MT LIQD: 1.0000 | OROMUCOSAL | Status: DC | PRN

## 2022-09-03 MED ORDER — ALUM & MAG HYDROXIDE-SIMETH 200-200-20 MG/5ML PO SUSP: 30.0000 mL | ORAL | Status: DC | PRN

## 2022-09-03 MED ORDER — GABAPENTIN 300 MG PO CAPS
ORAL_CAPSULE | ORAL | Status: AC
  Administered 2022-09-03: 300 mg via ORAL
  Filled 2022-09-03: qty 1

## 2022-09-03 MED ORDER — OXYCODONE HCL 5 MG PO TABS
10.0000 mg | ORAL_TABLET | ORAL | Status: DC | PRN
  Filled 2022-09-03: qty 2

## 2022-09-03 MED ORDER — ENOXAPARIN SODIUM 30 MG/0.3ML IJ SOSY
30.0000 mg | PREFILLED_SYRINGE | Freq: Two times a day (BID) | INTRAMUSCULAR | Status: DC
  Administered 2022-09-04: 30 mg via SUBCUTANEOUS
  Filled 2022-09-03: qty 0.3

## 2022-09-03 MED ORDER — PROPOFOL 1000 MG/100ML IV EMUL
INTRAVENOUS | Status: AC
  Filled 2022-09-03: qty 100

## 2022-09-03 MED ORDER — FLEET ENEMA 7-19 GM/118ML RE ENEM: 1.0000 | ENEMA | Freq: Once | RECTAL | Status: DC | PRN

## 2022-09-03 MED ORDER — MAGNESIUM HYDROXIDE 400 MG/5ML PO SUSP
30.0000 mL | Freq: Every day | ORAL | Status: DC
  Administered 2022-09-03 – 2022-09-04 (×2): 30 mL via ORAL
  Filled 2022-09-03 (×2): qty 30

## 2022-09-03 MED ORDER — TRANEXAMIC ACID-NACL 1000-0.7 MG/100ML-% IV SOLN: 1000.0000 mg | Freq: Once | INTRAVENOUS | Status: AC

## 2022-09-03 MED ORDER — FERROUS SULFATE 325 (65 FE) MG PO TABS
325.0000 mg | ORAL_TABLET | Freq: Two times a day (BID) | ORAL | Status: DC
  Administered 2022-09-04: 325 mg via ORAL
  Filled 2022-09-03: qty 1

## 2022-09-03 MED ORDER — ACETAMINOPHEN 325 MG PO TABS: 325.0000 mg | ORAL_TABLET | Freq: Four times a day (QID) | ORAL | Status: DC | PRN

## 2022-09-03 MED ORDER — FENTANYL CITRATE (PF) 100 MCG/2ML IJ SOLN: 25.0000 ug | INTRAMUSCULAR | Status: DC | PRN

## 2022-09-03 MED ORDER — OMEGA-3-ACID ETHYL ESTERS 1 G PO CAPS
1.0000 g | ORAL_CAPSULE | Freq: Every day | ORAL | Status: DC
  Administered 2022-09-03 – 2022-09-04 (×2): 1 g via ORAL
  Filled 2022-09-03 (×2): qty 1

## 2022-09-03 MED ORDER — ACETAMINOPHEN 10 MG/ML IV SOLN
INTRAVENOUS | Status: AC
  Filled 2022-09-03: qty 100

## 2022-09-03 MED ORDER — OXYCODONE HCL 5 MG/5ML PO SOLN: 5.0000 mg | Freq: Once | ORAL | Status: DC | PRN

## 2022-09-03 MED ORDER — TRANEXAMIC ACID-NACL 1000-0.7 MG/100ML-% IV SOLN
INTRAVENOUS | Status: AC
  Filled 2022-09-03: qty 100

## 2022-09-03 MED ORDER — OXYCODONE HCL 5 MG PO TABS
5.0000 mg | ORAL_TABLET | ORAL | Status: DC | PRN
  Administered 2022-09-04: 5 mg via ORAL

## 2022-09-03 MED ORDER — SURGIPHOR WOUND IRRIGATION SYSTEM - OPTIME
TOPICAL | Status: DC | PRN
  Administered 2022-09-03: 450 mL

## 2022-09-03 MED ORDER — ACETAMINOPHEN 10 MG/ML IV SOLN
1000.0000 mg | Freq: Four times a day (QID) | INTRAVENOUS | Status: DC
  Administered 2022-09-03 – 2022-09-04 (×3): 1000 mg via INTRAVENOUS
  Filled 2022-09-03 (×3): qty 100

## 2022-09-03 MED ORDER — SODIUM CHLORIDE 0.9 % IV SOLN: INTRAVENOUS | Status: DC

## 2022-09-03 MED ORDER — ONDANSETRON HCL 4 MG/2ML IJ SOLN: 4.0000 mg | Freq: Once | INTRAMUSCULAR | Status: DC | PRN

## 2022-09-03 MED ORDER — PHENYLEPHRINE HCL-NACL 20-0.9 MG/250ML-% IV SOLN
INTRAVENOUS | Status: DC | PRN
  Administered 2022-09-03: 30 ug/min via INTRAVENOUS

## 2022-09-03 MED ORDER — BISACODYL 10 MG RE SUPP: 10.0000 mg | Freq: Every day | RECTAL | Status: DC | PRN

## 2022-09-03 MED ORDER — TRANEXAMIC ACID-NACL 1000-0.7 MG/100ML-% IV SOLN
INTRAVENOUS | Status: AC
  Administered 2022-09-03: 1000 mg via INTRAVENOUS
  Filled 2022-09-03: qty 100

## 2022-09-03 MED ORDER — SENNOSIDES-DOCUSATE SODIUM 8.6-50 MG PO TABS
1.0000 | ORAL_TABLET | Freq: Two times a day (BID) | ORAL | Status: DC
  Administered 2022-09-03 – 2022-09-04 (×2): 1 via ORAL
  Filled 2022-09-03 (×2): qty 1

## 2022-09-03 MED ORDER — ONDANSETRON HCL 4 MG PO TABS: 4.0000 mg | ORAL_TABLET | Freq: Four times a day (QID) | ORAL | Status: DC | PRN

## 2022-09-03 MED ORDER — AMLODIPINE BESYLATE 5 MG PO TABS
2.5000 mg | ORAL_TABLET | Freq: Every day | ORAL | Status: DC
  Administered 2022-09-03: 2.5 mg via ORAL
  Filled 2022-09-03: qty 1

## 2022-09-03 MED ORDER — BUPIVACAINE HCL (PF) 0.5 % IJ SOLN
INTRAMUSCULAR | Status: DC | PRN
  Administered 2022-09-03: 3 mL

## 2022-09-03 MED ORDER — ACYCLOVIR 200 MG PO CAPS
400.0000 mg | ORAL_CAPSULE | Freq: Every day | ORAL | Status: DC
  Administered 2022-09-03: 400 mg via ORAL
  Filled 2022-09-03: qty 2

## 2022-09-03 MED ORDER — LACTATED RINGERS IV SOLN: INTRAVENOUS | Status: DC

## 2022-09-03 MED ORDER — EZETIMIBE 10 MG PO TABS
10.0000 mg | ORAL_TABLET | Freq: Every evening | ORAL | Status: DC
  Administered 2022-09-03: 10 mg via ORAL
  Filled 2022-09-03 (×2): qty 1

## 2022-09-03 MED ORDER — ACETAMINOPHEN 10 MG/ML IV SOLN
INTRAVENOUS | Status: DC | PRN
  Administered 2022-09-03: 1000 mg via INTRAVENOUS

## 2022-09-03 MED ORDER — TRAMADOL HCL 50 MG PO TABS: 50.0000 mg | ORAL_TABLET | ORAL | Status: DC | PRN

## 2022-09-03 MED ORDER — CEFAZOLIN SODIUM-DEXTROSE 2-4 GM/100ML-% IV SOLN
2.0000 g | Freq: Four times a day (QID) | INTRAVENOUS | Status: AC
  Administered 2022-09-03 – 2022-09-04 (×2): 2 g via INTRAVENOUS
  Filled 2022-09-03 (×2): qty 100

## 2022-09-03 MED ORDER — FAMOTIDINE 20 MG PO TABS
ORAL_TABLET | ORAL | Status: AC
  Filled 2022-09-03: qty 1

## 2022-09-03 MED ORDER — ACETAMINOPHEN 10 MG/ML IV SOLN: 1000.0000 mg | Freq: Once | INTRAVENOUS | Status: DC | PRN

## 2022-09-03 MED ORDER — 0.9 % SODIUM CHLORIDE (POUR BTL) OPTIME
TOPICAL | Status: DC | PRN
  Administered 2022-09-03: 1000 mL

## 2022-09-03 MED ORDER — MIDAZOLAM HCL 2 MG/2ML IJ SOLN
INTRAMUSCULAR | Status: AC
  Filled 2022-09-03: qty 2

## 2022-09-03 MED ORDER — PANTOPRAZOLE SODIUM 40 MG PO TBEC
40.0000 mg | DELAYED_RELEASE_TABLET | Freq: Two times a day (BID) | ORAL | Status: DC
  Administered 2022-09-03 – 2022-09-04 (×2): 40 mg via ORAL
  Filled 2022-09-03 (×2): qty 1

## 2022-09-03 MED ORDER — DIPHENHYDRAMINE HCL 12.5 MG/5ML PO ELIX: 12.5000 mg | ORAL_SOLUTION | ORAL | Status: DC | PRN

## 2022-09-03 MED ORDER — FAMOTIDINE 20 MG PO TABS
20.0000 mg | ORAL_TABLET | Freq: Once | ORAL | Status: AC
  Administered 2022-09-03: 20 mg via ORAL

## 2022-09-03 MED ORDER — METOCLOPRAMIDE HCL 5 MG PO TABS
10.0000 mg | ORAL_TABLET | Freq: Three times a day (TID) | ORAL | Status: DC
  Administered 2022-09-03 – 2022-09-04 (×2): 10 mg via ORAL
  Filled 2022-09-03 (×2): qty 2

## 2022-09-03 MED ORDER — ALPRAZOLAM 0.25 MG PO TABS: 0.1250 mg | ORAL_TABLET | Freq: Two times a day (BID) | ORAL | Status: DC | PRN

## 2022-09-03 MED ORDER — CEFAZOLIN SODIUM-DEXTROSE 2-4 GM/100ML-% IV SOLN
INTRAVENOUS | Status: AC
  Filled 2022-09-03: qty 100

## 2022-09-03 MED ORDER — SODIUM CHLORIDE (PF) 0.9 % IJ SOLN
INTRAMUSCULAR | Status: DC | PRN
  Administered 2022-09-03: 120 mL via INTRAMUSCULAR

## 2022-09-03 MED ORDER — PHENYLEPHRINE HCL (PRESSORS) 10 MG/ML IV SOLN
INTRAVENOUS | Status: AC
  Filled 2022-09-03: qty 1

## 2022-09-03 MED ORDER — CHLORHEXIDINE GLUCONATE 0.12 % MT SOLN
OROMUCOSAL | Status: AC
  Administered 2022-09-03: 15 mL via OROMUCOSAL
  Filled 2022-09-03: qty 15

## 2022-09-03 MED ORDER — ONDANSETRON HCL 4 MG/2ML IJ SOLN
INTRAMUSCULAR | Status: DC | PRN
  Administered 2022-09-03: 4 mg via INTRAVENOUS

## 2022-09-03 MED ORDER — MIDAZOLAM HCL 5 MG/5ML IJ SOLN
INTRAMUSCULAR | Status: DC | PRN
  Administered 2022-09-03 (×2): 1 mg via INTRAVENOUS

## 2022-09-03 MED ORDER — RISAQUAD PO CAPS
1.0000 | ORAL_CAPSULE | Freq: Every day | ORAL | Status: DC
  Administered 2022-09-03 – 2022-09-04 (×2): 1 via ORAL
  Filled 2022-09-03 (×2): qty 1

## 2022-09-03 MED ORDER — ONDANSETRON HCL 4 MG/2ML IJ SOLN: 4.0000 mg | Freq: Four times a day (QID) | INTRAMUSCULAR | Status: DC | PRN

## 2022-09-03 MED ORDER — HYDRALAZINE HCL 20 MG/ML IJ SOLN
5.0000 mg | Freq: Once | INTRAMUSCULAR | Status: AC | PRN
  Administered 2022-09-03: 5 mg via INTRAVENOUS

## 2022-09-03 MED ORDER — CELECOXIB 200 MG PO CAPS
200.0000 mg | ORAL_CAPSULE | Freq: Two times a day (BID) | ORAL | Status: DC
  Administered 2022-09-03 – 2022-09-04 (×2): 200 mg via ORAL
  Filled 2022-09-03 (×2): qty 1

## 2022-09-03 MED ORDER — HYDRALAZINE HCL 20 MG/ML IJ SOLN
5.0000 mg | Freq: Once | INTRAMUSCULAR | Status: DC
  Filled 2022-09-03: qty 0.25

## 2022-09-03 MED ORDER — MENTHOL 3 MG MT LOZG: 1.0000 | LOZENGE | OROMUCOSAL | Status: DC | PRN

## 2022-09-03 SURGICAL SUPPLY — 67 items
ATTUNE PSFEM RTSZ6 NARCEM KNEE (Femur) IMPLANT
ATTUNE PSRP INSR SZ6 6 KNEE (Insert) IMPLANT
BASEPLATE TIBIAL ROTATING SZ 4 (Knees) IMPLANT
BATTERY INSTRU NAVIGATION (MISCELLANEOUS) ×4 IMPLANT
BLADE SAW 70X12.5 (BLADE) ×1 IMPLANT
BLADE SAW 90X13X1.19 OSCILLAT (BLADE) ×1 IMPLANT
BLADE SAW 90X25X1.19 OSCILLAT (BLADE) ×1 IMPLANT
CEMENT HV SMART SET (Cement) IMPLANT
COOLER POLAR GLACIER W/PUMP (MISCELLANEOUS) ×1 IMPLANT
CUFF TOURN SGL QUICK 24 (TOURNIQUET CUFF) ×1
CUFF TRNQT CYL 24X4X16.5-23 (TOURNIQUET CUFF) IMPLANT
DRAPE 3/4 80X56 (DRAPES) ×1 IMPLANT
DRAPE INCISE IOBAN 66X45 STRL (DRAPES) IMPLANT
DRSG DERMACEA NONADH 3X8 (GAUZE/BANDAGES/DRESSINGS) ×1 IMPLANT
DRSG MEPILEX SACRM 8.7X9.8 (GAUZE/BANDAGES/DRESSINGS) ×1 IMPLANT
DRSG OPSITE POSTOP 4X14 (GAUZE/BANDAGES/DRESSINGS) ×1 IMPLANT
DRSG TEGADERM 4X4.75 (GAUZE/BANDAGES/DRESSINGS) ×1 IMPLANT
DURAPREP 26ML APPLICATOR (WOUND CARE) ×2 IMPLANT
ELECT CAUTERY BLADE 6.4 (BLADE) ×1 IMPLANT
ELECT REM PT RETURN 9FT ADLT (ELECTROSURGICAL) ×1
ELECTRODE REM PT RTRN 9FT ADLT (ELECTROSURGICAL) ×1 IMPLANT
EX-PIN ORTHOLOCK NAV 4X150 (PIN) ×2 IMPLANT
GLOVE BIOGEL M STRL SZ7.5 (GLOVE) ×2 IMPLANT
GLOVE BIOGEL PI IND STRL 7.5 (GLOVE) ×1 IMPLANT
GLOVE PI ORTHO PRO STRL 7.5 (GLOVE) ×2 IMPLANT
GLOVE SURG UNDER POLY LF SZ7.5 (GLOVE) ×1 IMPLANT
GOWN STRL REUS W/ TWL LRG LVL3 (GOWN DISPOSABLE) ×2 IMPLANT
GOWN STRL REUS W/ TWL XL LVL3 (GOWN DISPOSABLE) ×1 IMPLANT
GOWN STRL REUS W/TWL LRG LVL3 (GOWN DISPOSABLE) ×2
GOWN STRL REUS W/TWL XL LVL3 (GOWN DISPOSABLE) ×1
HEMOVAC 400CC 10FR (MISCELLANEOUS) ×1 IMPLANT
HOLDER FOLEY CATH W/STRAP (MISCELLANEOUS) ×1 IMPLANT
HOOD PEEL AWAY T7 (MISCELLANEOUS) ×2 IMPLANT
IV NS IRRIG 3000ML ARTHROMATIC (IV SOLUTION) ×1 IMPLANT
KIT TURNOVER KIT A (KITS) ×1 IMPLANT
KNIFE SCULPS 14X20 (INSTRUMENTS) ×1 IMPLANT
MANIFOLD NEPTUNE II (INSTRUMENTS) ×2 IMPLANT
NDL SPNL 20GX3.5 QUINCKE YW (NEEDLE) ×2 IMPLANT
NEEDLE SPNL 20GX3.5 QUINCKE YW (NEEDLE) ×2 IMPLANT
NS IRRIG 500ML POUR BTL (IV SOLUTION) ×1 IMPLANT
PACK TOTAL KNEE (MISCELLANEOUS) ×1 IMPLANT
PAD ABD DERMACEA PRESS 5X9 (GAUZE/BANDAGES/DRESSINGS) ×2 IMPLANT
PAD WRAPON POLAR KNEE (MISCELLANEOUS) ×1 IMPLANT
PATELLA MEDIAL ATTUN 35MM KNEE (Knees) IMPLANT
PIN DRILL FIX HALF THREAD (BIT) ×2 IMPLANT
PIN DRILL QUICK PACK ×2 IMPLANT
PIN FIXATION 1/8DIA X 3INL (PIN) ×1 IMPLANT
PULSAVAC PLUS IRRIG FAN TIP (DISPOSABLE) ×1
SOL PREP PVP 2OZ (MISCELLANEOUS) ×1
SOLUTION IRRIG SURGIPHOR (IV SOLUTION) ×1 IMPLANT
SOLUTION PREP PVP 2OZ (MISCELLANEOUS) ×1 IMPLANT
SPONGE DRAIN TRACH 4X4 STRL 2S (GAUZE/BANDAGES/DRESSINGS) ×1 IMPLANT
STAPLER SKIN PROX 35W (STAPLE) ×1 IMPLANT
STOCKINETTE IMPERV 14X48 (MISCELLANEOUS) IMPLANT
STRAP TIBIA SHORT (MISCELLANEOUS) ×1 IMPLANT
SUCTION FRAZIER HANDLE 10FR (MISCELLANEOUS) ×1
SUCTION TUBE FRAZIER 10FR DISP (MISCELLANEOUS) ×1 IMPLANT
SUT VIC AB 0 CT1 36 (SUTURE) ×2 IMPLANT
SUT VIC AB 1 CT1 36 (SUTURE) ×2 IMPLANT
SUT VIC AB 2-0 CT2 27 (SUTURE) ×1 IMPLANT
SYR 30ML LL (SYRINGE) ×2 IMPLANT
TIP FAN IRRIG PULSAVAC PLUS (DISPOSABLE) ×1 IMPLANT
TOWER CARTRIDGE SMART MIX (DISPOSABLE) ×1 IMPLANT
TRAP FLUID SMOKE EVACUATOR (MISCELLANEOUS) ×1 IMPLANT
TRAY FOLEY MTR SLVR 16FR STAT (SET/KITS/TRAYS/PACK) ×1 IMPLANT
WATER STERILE IRR 1000ML POUR (IV SOLUTION) ×1 IMPLANT
WRAPON POLAR PAD KNEE (MISCELLANEOUS) ×1

## 2022-09-03 NOTE — Interval H&P Note (Signed)
History and Physical Interval Note:  09/03/2022 10:56 AM  Mary Pacheco  has presented today for surgery, with the diagnosis of Primary osteoarthritis of right knee M17.11.  The various methods of treatment have been discussed with the patient and family. After consideration of risks, benefits and other options for treatment, the patient has consented to  Procedure(s): COMPUTER ASSISTED TOTAL KNEE ARTHROPLASTY (Right) as a surgical intervention.  The patient's history has been reviewed, patient examined, no change in status, stable for surgery.  I have reviewed the patient's chart and labs.  Questions were answered to the patient's satisfaction.     [place]

## 2022-09-03 NOTE — Discharge Summary (Signed)
Physician Discharge Summary  Patient ID: Mary Pacheco MRN: [id number] DOB/AGE: [birth year] 81 y.o.  Admit date: 09/03/2022 Discharge date: 09/04/2022  Admission Diagnoses:  Total knee replacement status [Z96.659]  Surgeries:Procedure(s):  Right total knee arthroplasty using computer-assisted navigation   SURGEON:  Marciano Sequin. M.D.   ASSISTANT: Cassell Smiles, PA-C (present and scrubbed throughout the case, critical for assistance with exposure, retraction, instrumentation, and closure)   ANESTHESIA: spinal   ESTIMATED BLOOD LOSS: 50 mL   FLUIDS REPLACED: 1200 mL of crystalloid   TOURNIQUET TIME: 106 minutes   DRAINS: 2 medium Hemovac drains   SOFT TISSUE RELEASES: Anterior cruciate ligament, posterior cruciate ligament, deep medial collateral ligament, patellofemoral ligament, posterolateral corner   IMPLANTS UTILIZED: DePuy Attune size 6N posterior stabilized femoral component (cemented), size 4 rotating platform tibial component (cemented), 35 mm medialized dome patella (cemented), and a 6 mm stabilized rotating platform polyethylene insert.  Discharge Diagnoses: Patient Active Problem List   Diagnosis Date Noted   Primary osteoarthritis of right knee 05/06/2022   Left shoulder pain 09/05/2020   Dizziness 03/25/2020   Anemia 03/11/2020   Orthostatic hypotension 03/11/2020   Lower extremity edema 02/29/2020   Total knee replacement status 08/19/2019   Rash 06/05/2019   Right ankle swelling 01/26/2019   PAD (peripheral artery disease) ([place]) 01/20/2019   Prediabetes 08/20/2018   Abdominal bloating 11/11/2017   Routine general medical examination at a health care facility 05/22/2016   Plantar fasciitis, right 02/21/2016   Right sciatic nerve pain 10/11/2015   Benign paroxysmal positional vertigo 04/11/2015   Rhinitis, allergic 01/05/2015   Knee pain, bilateral 05/24/2014   Colon cancer screening 05/17/2014   Screening for breast cancer 04/06/2014   Carotid  stenosis 11/24/2013   Overweight (BMI 25.0-29.9) 04/28/2013   Medicare annual wellness visit, subsequent 03/20/2013   Hypothyroidism 03/20/2013   Headache 03/20/2013   Anxiety 09/15/2012   Herpes genitalis 01/09/2012   GERD (gastroesophageal reflux disease) 01/09/2012   Osteopenia 01/09/2012   Essential hypertension 07/12/2011   Hyperlipidemia, unspecified     Past Medical History:  Diagnosis Date   Anemia    in the past    Anxiety 2011   patient with anxiety will continue Alprazolam prn   Arthritis    Asthma    grew up with asthma, but not as an adult    Carotid artery disease ([place])    GERD (gastroesophageal reflux disease)    pt. with esophagea reflux. She reports great improvement with Dexilant.   Hearing loss    s/p hearing aids   Herpes 02/20/2010   gential   Hx of transfusion of whole blood    Hyperlipidemia    patient with HL on statins. lipids well controlled   Hypertension    Hypothyroidism    PAD (peripheral artery disease) ([place])    Pre-diabetes    Skin cancer    left leg     Transfusion:    Consultants (if any):   Discharged Condition: Improved  Hospital Course: Mary Pacheco is an 81 y.o. female who was admitted 09/03/2022 with a diagnosis of right knee osteoarthritis and went to the operating room on 09/03/2022 and underwent right total knee arthroplasty. The patient received perioperative antibiotics for prophylaxis (see below). The patient tolerated the procedure well and was transported to PACU in stable condition. After meeting PACU criteria, the patient was subsequently transferred to the Orthopaedics/Rehabilitation unit.   The patient received DVT prophylaxis in the form of early mobilization, Lovenox, TED  hose, and SCDs . A sacral pad had been placed and heels were elevated off of the bed with rolled towels in order to protect skin integrity. Foley catheter was discontinued on postoperative day #0. Wound drains were discontinued on postoperative  day #1. The surgical incision was healing well without signs of infection.  Physical therapy was initiated postoperatively for transfers, gait training, and strengthening. Occupational therapy was initiated for activities of daily living and evaluation for assisted devices. Rehabilitation goals were reviewed in detail with the patient. The patient made steady progress with physical therapy and physical therapy recommended discharge to Home.   The patient achieved the preliminary goals of this hospitalization and was felt to be medically and orthopaedically appropriate for discharge.  She was given perioperative antibiotics:  Anti-infectives (From admission, onward)    Start     Dose/Rate Route Frequency Ordered Stop   09/03/22 2200  acyclovir (ZOVIRAX) 200 MG capsule 400 mg        400 mg Oral Daily at bedtime 09/03/22 1732     09/03/22 1830  ceFAZolin (ANCEF) IVPB 2g/100 mL premix        2 g 200 mL/hr over 30 Minutes Intravenous Every 6 hours 09/03/22 1732 09/04/22 0052   09/03/22 0949  ceFAZolin (ANCEF) 2-4 GM/100ML-% IVPB       Note to Pharmacy: Sylvester Harder P: cabinet override      09/03/22 0949 09/03/22 1153   09/03/22 0600  ceFAZolin (ANCEF) IVPB 2g/100 mL premix        2 g 200 mL/hr over 30 Minutes Intravenous On call to O.R. 09/02/22 2206 09/03/22 1153     .  Recent vital signs:  Vitals:   09/04/22 0700 09/04/22 0753  BP:  (!) 127/41  Pulse:  67  Resp:  16  Temp: (!) 97.5 F (36.4 C)   SpO2:  100%    Recent laboratory studies:  No results for input(s): "WBC", "HGB", "HCT", "PLT", "K", "CL", "CO2", "BUN", "CREATININE", "GLUCOSE", "CALCIUM", "LABPT", "INR" in the last 72 hours.  Diagnostic Studies: DG Knee Right Port  Result Date: 09/03/2022 CLINICAL DATA:  Status post right total knee replacement. EXAM: PORTABLE RIGHT KNEE - 1-2 VIEW COMPARISON:  Right knee MRI 04/15/2020 FINDINGS: Sequelae of right total knee arthroplasty are identified. The prosthetic components  appear normally positioned. No acute fracture is identified. Gas is noted in the surrounding soft tissues. A surgical drain and skin staples are in place. IMPRESSION: Right total knee arthroplasty without evidence of acute complication. Electronically Signed   By: Logan Bores M.D.   On: 09/03/2022 15:45    Discharge Medications:   Allergies as of 09/04/2022       Reactions   Molds & Smuts Other (See Comments)   Induces asthma Induces asthma   Rosuvastatin Hives   Simvastatin Other (See Comments)   myalgia        Medication List     STOP taking these medications    aspirin EC 81 MG tablet       TAKE these medications    acetaminophen 500 MG tablet Commonly known as: TYLENOL Take 1,000 mg by mouth every 6 (six) hours as needed for moderate pain.   acyclovir 400 MG tablet Commonly known as: ZOVIRAX TAKE 1 TABLET BY MOUTH EVERY DAY What changed: when to take this   ALPRAZolam 0.25 MG tablet Commonly known as: XANAX TAKE 1/2 TABLET (0.125 MG TOTAL) BY MOUTH DAILY AS NEEDED FOR ANXIETY.   amLODipine 2.5 MG  tablet Commonly known as: NORVASC TAKE 1 TABLET BY MOUTH AT BEDTIME. MAY TAKE ADDITIONAL DOSE FOR BLOOD PRESSURE > 135/85.   Apoaequorin 10 MG Caps Take 1 tablet by mouth daily.   atorvastatin 20 MG tablet Commonly known as: LIPITOR TAKE 1 TABLET BY MOUTH EVERY DAY What changed: when to take this   celecoxib 200 MG capsule Commonly known as: CELEBREX TAKE 1 CAPSULE BY MOUTH TWICE A DAY What changed:  when to take this reasons to take this   Coenzyme Q10 200 MG capsule Take 200 mg by mouth daily.   CRANBERRY PO Take 650 mg by mouth daily.   enoxaparin 40 MG/0.4ML injection Commonly known as: LOVENOX Inject 0.4 mLs (40 mg total) into the skin daily for 14 days.   ezetimibe 10 MG tablet Commonly known as: ZETIA Take 1 tablet (10 mg total) by mouth daily. What changed: when to take this   Fish Oil Triple Strength 1400 MG Caps Take 1,400 mg by mouth  daily.   GREEN TEA PO Take 500 mg by mouth daily.   hydrochlorothiazide 12.5 MG capsule Commonly known as: MICROZIDE Take 1 capsule (12.5 mg total) by mouth daily.   oxyCODONE 5 MG immediate release tablet Commonly known as: Oxy IR/ROXICODONE Take 1 tablet (5 mg total) by mouth every 4 (four) hours as needed for severe pain.   pantoprazole 40 MG tablet Commonly known as: PROTONIX TAKE 1 TABLET DAILY What changed:  how to take this when to take this reasons to take this additional instructions   PROBIOTIC DAILY PO Take 1 capsule by mouth daily.   Synthroid 50 MCG tablet Generic drug: levothyroxine TAKE 1 TABLET DAILY   TOTAL CARDIO HEALTH FORMULA PO Take 1 tablet by mouth daily.   traMADol 50 MG tablet Commonly known as: ULTRAM Take 1 tablet (50 mg total) by mouth every 4 (four) hours as needed for moderate pain.   Vitamin D 125 MCG (5000 UT) Caps Take 5,000 Units by mouth daily.   Voltaren Arthritis Pain 1 % Gel Generic drug: diclofenac Sodium Apply 2 g topically 4 (four) times daily as needed.               Durable Medical Equipment  (From admission, onward)           Start     Ordered   09/03/22 1733  DME Walker rolling  Once       Question:  Patient needs a walker to treat with the following condition  Answer:  Total knee replacement status   09/03/22 1732   09/03/22 1733  DME Bedside commode  Once       Comments: Patient is not able to walk the distance required to go the bathroom, or he/she is unable to safely negotiate stairs required to access the bathroom.  A 3in1 BSC will alleviate this problem  Question:  Patient needs a bedside commode to treat with the following condition  Answer:  Total knee replacement status   09/03/22 1732            Disposition: Home with home health PT     Follow-up Information     Watt Climes, PA Follow up on 09/17/2022.   Specialty: Physician Assistant Why: at 1:45pm Contact information: [place] [address] [phone]         Dereck Leep, MD Follow up on 10/23/2022.   Specialty: Orthopedic Surgery Why: at 1:45pm Contact information: [place]  [place] [address] [place], PA-C 09/04/2022, 2:31 PM

## 2022-09-03 NOTE — Anesthesia Procedure Notes (Signed)
Spinal  Patient location during procedure: OR Start time: 09/03/2022 11:35 AM End time: 09/03/2022 11:40 AM Reason for block: surgical anesthesia Staffing Performed: resident/CRNA  Anesthesiologist: Piscitello, Precious Haws, MD Resident/CRNA: Nelda Marseille, CRNA Performed by: Nelda Marseille, CRNA Authorized by: Darrin Nipper, MD   Preanesthetic Checklist Completed: patient identified, IV checked, site marked, risks and benefits discussed, surgical consent, monitors and equipment checked, pre-op evaluation and timeout performed Spinal Block Patient position: sitting Prep: ChloraPrep Patient monitoring: heart rate, continuous pulse ox, blood pressure and cardiac monitor Approach: midline Location: L3-4 Injection technique: single-shot Needle Needle type: Whitacre and Introducer  Needle gauge: 25 G Needle length: 9 cm Assessment Sensory level: T10 Events: CSF return Additional Notes Sterile aseptic technique used throughout the procedure.  Negative paresthesia. Negative blood return. Positive free-flowing CSF. Expiration date of kit checked and confirmed. Patient tolerated procedure well, without complications.

## 2022-09-03 NOTE — Anesthesia Preprocedure Evaluation (Addendum)
Anesthesia Evaluation  Patient identified by MRN, date of birth, ID band Patient awake    Reviewed: Allergy & Precautions, NPO status , Patient's Chart, lab work & pertinent test results  History of Anesthesia Complications Negative for: history of anesthetic complications  Airway Mallampati: II   Neck ROM: Full    Dental  (+) Missing Bridge :   Pulmonary asthma    Pulmonary exam normal breath sounds clear to auscultation       Cardiovascular hypertension, + Peripheral Vascular Disease (carotid stenosis)  Normal cardiovascular exam Rhythm:Regular Rate:Normal  ECG 05/04/22: NSR, low voltage   Neuro/Psych  Headaches PSYCHIATRIC DISORDERS Anxiety        GI/Hepatic ,GERD  ,,  Endo/Other  Hypothyroidism  Prediabetes   Renal/GU negative Renal ROS     Musculoskeletal  (+) Arthritis ,    Abdominal   Peds  Hematology negative hematology ROS (+)   Anesthesia Other Findings Cardiology note 05/04/22:  ASSESSMENT & PLAN:    PAD (peripheral artery disease) ([place]) carotid ultrasound  reviewed: stable Zetia , Lipitor, LDL at goal   Bilateral carotid artery stenosis Ultrasound 04/2022 60% stenosis on the right Less than 39% disease on the left   Mixed hyperlipidemia Lipitor with Zetia, refills Cholesterol is at goal on the current lipid regimen. No changes to the medications were made.   Essential hypertension Somewhat labile On avg 938-182 systolic Rare low and high, encouraged fluid   Prediabetes Eating out more A1C 6.1    Reproductive/Obstetrics                             Anesthesia Physical Anesthesia Plan  ASA: 2  Anesthesia Plan: Spinal   Post-op Pain Management:    Induction:   PONV Risk Score and Plan: 2  Airway Management Planned: Natural Airway and Nasal Cannula  Additional Equipment:   Intra-op Plan:   Post-operative Plan:   Informed Consent: I have reviewed  the patients History and Physical, chart, labs and discussed the procedure including the risks, benefits and alternatives for the proposed anesthesia with the patient or authorized representative who has indicated his/her understanding and acceptance.     Dental Advisory Given  Plan Discussed with: Anesthesiologist, CRNA and Surgeon  Anesthesia Plan Comments: (Patient reports no bleeding problems and no anticoagulant use.  Plan for spinal with backup GA  Patient consented for risks of anesthesia including but not limited to:  - adverse reactions to medications - damage to eyes, teeth, lips or other oral mucosa - nerve damage due to positioning  - risk of bleeding, infection and or nerve damage from spinal that could lead to paralysis - risk of headache or failed spinal - damage to teeth, lips or other oral mucosa - sore throat or hoarseness - damage to heart, brain, nerves, lungs, other parts of body or loss of life  Patient voiced understanding.)        Anesthesia Quick Evaluation

## 2022-09-03 NOTE — Transfer of Care (Signed)
Immediate Anesthesia Transfer of Care Note  Patient: Mary Pacheco  Procedure(s) Performed: COMPUTER ASSISTED TOTAL KNEE ARTHROPLASTY (Right: Knee)  Patient Location: PACU  Anesthesia Type:Spinal  Level of Consciousness: awake, alert , and oriented  Airway & Oxygen Therapy: Patient Spontanous Breathing and Patient connected to face mask oxygen  Post-op Assessment: Report given to RN and Post -op Vital signs reviewed and stable  Post vital signs: Reviewed and stable  Last Vitals:  Vitals Value Taken Time  BP 143/58 09/03/22 1522  Temp    Pulse 72 09/03/22 1527  Resp 12 09/03/22 1527  SpO2 99 % 09/03/22 1527  Vitals shown include unvalidated device data.  Last Pain:  Vitals:   09/03/22 1011  TempSrc: Oral  PainSc: 2          Complications: No notable events documented.

## 2022-09-03 NOTE — Anesthesia Postprocedure Evaluation (Signed)
Anesthesia Post Note  Patient: TULIP MEHARG  Procedure(s) Performed: COMPUTER ASSISTED TOTAL KNEE ARTHROPLASTY (Right: Knee)  Patient location during evaluation: PACU Anesthesia Type: Spinal Level of consciousness: awake and alert, oriented and patient cooperative Pain management: pain level controlled Vital Signs Assessment: post-procedure vital signs reviewed and stable Respiratory status: spontaneous breathing, nonlabored ventilation and respiratory function stable Cardiovascular status: blood pressure returned to baseline and stable Postop Assessment: adequate PO intake, no headache and spinal receding Anesthetic complications: no   No notable events documented.   Last Vitals:  Vitals:   09/03/22 1546 09/03/22 1600  BP: (!) 154/58 (!) 155/57  Pulse: 61 68  Resp: 15 19  Temp:    SpO2: 100% 100%    Last Pain:  Vitals:   09/03/22 1600  TempSrc:   PainSc: 0-No pain                 Darrin Nipper

## 2022-09-03 NOTE — Anesthesia Procedure Notes (Signed)
Date/Time: 09/03/2022 11:40 AM  Performed by: Doreen Salvage, CRNAPre-anesthesia Checklist: Patient identified, Emergency Drugs available, Suction available and Patient being monitored Patient Re-evaluated:Patient Re-evaluated prior to induction Oxygen Delivery Method: Simple face mask Induction Type: IV induction Dental Injury: Teeth and Oropharynx as per pre-operative assessment

## 2022-09-03 NOTE — Plan of Care (Signed)
  Problem: Education: Goal: Knowledge of the prescribed therapeutic regimen will improve Outcome: Progressing   Problem: Pain Management: Goal: Pain level will decrease with appropriate interventions Outcome: Progressing   Problem: Skin Integrity: Goal: Will show signs of wound healing Outcome: Progressing   Problem: Safety: Goal: Ability to remain free from injury will improve Outcome: Progressing   Problem: Skin Integrity: Goal: Risk for impaired skin integrity will decrease Outcome: Progressing

## 2022-09-03 NOTE — Op Note (Signed)
OPERATIVE NOTE  DATE OF SURGERY:  09/03/2022  PATIENT NAME:  Mary Pacheco   DOB: 12-31-1940  MRN: [id number]  PRE-OPERATIVE DIAGNOSIS: Degenerative arthrosis of the right knee, primary  POST-OPERATIVE DIAGNOSIS:  Same  PROCEDURE:  Right total knee arthroplasty using computer-assisted navigation  SURGEON:  Marciano Sequin. M.D.  ASSISTANT: Cassell Smiles, PA-C (present and scrubbed throughout the case, critical for assistance with exposure, retraction, instrumentation, and closure)  ANESTHESIA: spinal  ESTIMATED BLOOD LOSS: 50 mL  FLUIDS REPLACED: 1200 mL of crystalloid  TOURNIQUET TIME: 106 minutes  DRAINS: 2 medium Hemovac drains  SOFT TISSUE RELEASES: Anterior cruciate ligament, posterior cruciate ligament, deep medial collateral ligament, patellofemoral ligament, posterolateral corner  IMPLANTS UTILIZED: DePuy Attune size 6N posterior stabilized femoral component (cemented), size 4 rotating platform tibial component (cemented), 35 mm medialized dome patella (cemented), and a 6 mm stabilized rotating platform polyethylene insert.  INDICATIONS FOR SURGERY: Mary Pacheco is a 81 y.o. year old female with a long history of progressive knee pain. X-rays demonstrated severe degenerative changes in tricompartmental fashion. The patient had not seen any significant improvement despite conservative nonsurgical intervention. After discussion of the risks and benefits of surgical intervention, the patient expressed understanding of the risks benefits and agree with plans for total knee arthroplasty.   The risks, benefits, and alternatives were discussed at length including but not limited to the risks of infection, bleeding, nerve injury, stiffness, blood clots, the need for revision surgery, cardiopulmonary complications, among others, and they were willing to proceed.  PROCEDURE IN DETAIL: The patient was brought into the operating room and, after adequate spinal anesthesia was achieved,  a tourniquet was placed on the patient's upper thigh. The patient's knee and leg were cleaned and prepped with alcohol and DuraPrep and draped in the usual sterile fashion. A "timeout" was performed as per usual protocol. The lower extremity was exsanguinated using an Esmarch, and the tourniquet was inflated to 300 mmHg. An anterior longitudinal incision was made followed by a standard mid vastus approach. The deep fibers of the medial collateral ligament were elevated in a subperiosteal fashion off of the medial flare of the tibia so as to maintain a continuous soft tissue sleeve. The patella was subluxed laterally and the patellofemoral ligament was incised. Inspection of the knee demonstrated severe degenerative changes with full-thickness loss of articular cartilage. Osteophytes were debrided using a rongeur. Anterior and posterior cruciate ligaments were excised. Two 4.0 mm Schanz pins were inserted in the femur and into the tibia for attachment of the array of trackers used for computer-assisted navigation. Hip center was identified using a circumduction technique. Distal landmarks were mapped using the computer. The distal femur and proximal tibia were mapped using the computer. The distal femoral cutting guide was positioned using computer-assisted navigation so as to achieve a 5 distal valgus cut. The femur was sized and it was felt that a size 6N femoral component was appropriate. A size 6 femoral cutting guide was positioned and the anterior cut was performed and verified using the computer. This was followed by completion of the posterior and chamfer cuts. Femoral cutting guide for the central box was then positioned in the center box cut was performed.  Attention was then directed to the proximal tibia. Medial and lateral menisci were excised. The extramedullary tibial cutting guide was positioned using computer-assisted navigation so as to achieve a 0 varus-valgus alignment and 3 posterior slope.  The cut was performed and verified using the computer. The proximal  tibia was sized and it was felt that a size 4 tibial tray was appropriate. Tibial and femoral trials were inserted followed by insertion of a 5 mm polyethylene insert. The knee was felt to be tight laterally.  The trial components were removed and the knee was brought into full extension and distracted using the [place] retractors.  The posterolateral corner was carefully released using a combination of electrocautery and Metzenbaum scissors.  Trial components were reinserted followed by placement of a 6 mm polyethylene trial.  This allowed for excellent mediolateral soft tissue balancing both in flexion and in full extension. Finally, the patella was cut and prepared so as to accommodate a 35 mm medialized dome patella. A patella trial was placed and the knee was placed through a range of motion with excellent patellar tracking appreciated. The femoral trial was removed after debridement of posterior osteophytes. The central post-hole for the tibial component was reamed followed by insertion of a keel punch. Tibial trials were then removed. Cut surfaces of bone were irrigated with copious amounts of normal saline using pulsatile lavage and then suctioned dry. Polymethylmethacrylate cement was prepared in the usual fashion using a vacuum mixer. Cement was applied to the cut surface of the proximal tibia as well as along the undersurface of a size 4 rotating platform tibial component. Tibial component was positioned and impacted into place. Excess cement was removed using [employer] fast streamer. Cement was then applied to the cut surfaces of the femur as well as along the posterior flanges of the size 6N femoral component. The femoral component was positioned and impacted into place. Excess cement was removed using [employer] fast streamer. A 6 mm polyethylene trial was inserted and the knee was brought into full extension with steady axial compression applied.  Finally, cement was applied to the backside of a 35 mm medialized dome patella and the patellar component was positioned and patellar clamp applied. Excess cement was removed using [employer] fast streamer. After adequate curing of the cement, the tourniquet was deflated after a total tourniquet time of 106 minutes. Hemostasis was achieved using electrocautery. The knee was irrigated with copious amounts of normal saline using pulsatile lavage followed by 450 ml of Surgiphor and then suctioned dry. 20 mL of 1.3% Exparel and 60 mL of 0.25% Marcaine in 40 mL of normal saline was injected along the posterior capsule, medial and lateral gutters, and along the arthrotomy site. A 6 mm stabilized rotating platform polyethylene insert was inserted and the knee was placed through a range of motion with excellent mediolateral soft tissue balancing appreciated and excellent patellar tracking noted. 2 medium drains were placed in the wound bed and brought out through separate stab incisions. The medial parapatellar portion of the incision was reapproximated using interrupted sutures of #1 Vicryl. Subcutaneous tissue was approximated in layers using first #0 Vicryl followed #2-0 Vicryl. The skin was approximated with skin staples. A sterile dressing was applied.  The patient tolerated the procedure well and was transported to the recovery room in stable condition.    Karri Kallenbach P. Holley Bouche., M.D.

## 2022-09-04 ENCOUNTER — Encounter: Payer: Self-pay | Admitting: Orthopedic Surgery

## 2022-09-04 DIAGNOSIS — M1711 Unilateral primary osteoarthritis, right knee: Secondary | ICD-10-CM | POA: Diagnosis not present

## 2022-09-04 MED ORDER — TRAMADOL HCL 50 MG PO TABS: 50.0000 mg | ORAL_TABLET | ORAL | 0 refills | Status: AC | PRN

## 2022-09-04 MED ORDER — ENOXAPARIN SODIUM 40 MG/0.4ML IJ SOSY: 40.0000 mg | PREFILLED_SYRINGE | INTRAMUSCULAR | 0 refills | Status: AC

## 2022-09-04 MED ORDER — OXYCODONE HCL 5 MG PO TABS: 5.0000 mg | ORAL_TABLET | ORAL | 0 refills | Status: AC | PRN

## 2022-09-04 NOTE — Progress Notes (Signed)
Physical Therapy Treatment Patient Details Name: Mary Pacheco MRN: [id number] DOB: 1941/05/27 Today's Date: 09/04/2022   History of Present Illness Pt admitted for R TKR. HIstory includes anxiety, HLD, HTN, and GERD. History of L TKR 3 years ago.    PT Comments    Pt is making good progress towards goals and is ready for dc at this time. Completed stair training safely and pain under control. Updated RN.    Recommendations for follow up therapy are one component of a multi-disciplinary discharge planning process, led by the attending physician.  Recommendations may be updated based on patient status, additional functional criteria and insurance authorization.  Follow Up Recommendations  Home health PT     Assistance Recommended at Discharge Set up Supervision/Assistance  Patient can return home with the following A little help with walking and/or transfers;Assist for transportation;Help with stairs or ramp for entrance   Equipment Recommendations  None recommended by PT    Recommendations for Other Services       Precautions / Restrictions Precautions Precautions: Fall;Knee Precaution Booklet Issued: Yes (comment) Restrictions Weight Bearing Restrictions: Yes RLE Weight Bearing: Weight bearing as tolerated     Mobility  Bed Mobility               General bed mobility comments: NT, received in standing at beginning of session and in chair at end of session    Transfers Overall transfer level: Needs assistance Equipment used: Rolling walker (2 wheels) Transfers: Sit to/from Stand Sit to Stand: Supervision           General transfer comment: impulsive during standing, cues to wait for therapist    Ambulation/Gait Ambulation/Gait assistance: Supervision Gait Distance (Feet): 200 Feet Assistive device: Rolling walker (2 wheels) Gait Pattern/deviations: Step-through pattern       General Gait Details: ambulated with reciprocal gait pattern and fluid  gait. Symmetrical stepping and safe technique with RW.   Stairs Stairs: Yes Stairs assistance: Min guard Stair Management: One rail Left Number of Stairs: 4 General stair comments: up/down with safe technique with 1 hand rail. PT demonstrated prior to performance   Wheelchair Mobility    Modified Rankin (Stroke Patients Only)       Balance Overall balance assessment: Needs assistance Sitting-balance support: Feet supported Sitting balance-Leahy Scale: Good     Standing balance support: Bilateral upper extremity supported Standing balance-Leahy Scale: Good                              Cognition Arousal/Alertness: Awake/alert Behavior During Therapy: WFL for tasks assessed/performed Overall Cognitive Status: Within Functional Limits for tasks assessed                                          Exercises Total Joint Exercises Goniometric ROM: R knee AAROM flexion to 91 degrees Other Exercises Other Exercises: performed seated ther-ex including LAQ and seated knee flexion stretches. 10 reps    General Comments        Pertinent Vitals/Pain Pain Assessment Pain Assessment: Faces Faces Pain Scale: Hurts a little bit Pain Location: R knee Pain Descriptors / Indicators: Operative site guarding Pain Intervention(s): Limited activity within patient's tolerance, Repositioned, Ice applied    Home Living Family/patient expects to be discharged to:: Private residence Living Arrangements: Spouse/significant other Available Help at Discharge: Family;Available PRN/intermittently (sister  is coming from [place] to assist. Pt has friends to bring meals) Type of Home: House Home Access: Stairs to enter Entrance Stairs-Rails: Left Entrance Stairs-Number of Steps: 2   Home Layout: One level Home Equipment: Conservation officer, nature (2 wheels);Shower seat - built in      Prior Function            PT Goals (current goals can now be found in the care plan  section) Acute Rehab PT Goals Patient Stated Goal: to go home PT Goal Formulation: With patient Time For Goal Achievement: 09/18/22 Potential to Achieve Goals: Good Additional Goals Additional Goal #1: Pt will be able to perform bed mobility/transfers with mod I and safe technique in order to improve functional independence Progress towards PT goals: Progressing toward goals    Frequency    BID      PT Plan Current plan remains appropriate    Co-evaluation              AM-PAC PT "6 Clicks" Mobility   Outcome Measure  Help needed turning from your back to your side while in a flat bed without using bedrails?: A Little Help needed moving from lying on your back to sitting on the side of a flat bed without using bedrails?: A Little Help needed moving to and from a bed to a chair (including a wheelchair)?: A Little Help needed standing up from a chair using your arms (e.g., wheelchair or bedside chair)?: A Little Help needed to walk in hospital room?: A Little Help needed climbing 3-5 steps with a railing? : A Little 6 Click Score: 18    End of Session Equipment Utilized During Treatment: Gait belt Activity Tolerance: Patient tolerated treatment well Patient left: in chair;with family/visitor present Nurse Communication: Mobility status PT Visit Diagnosis: Muscle weakness (generalized) (M62.81);Difficulty in walking, not elsewhere classified (R26.2);Pain Pain - Right/Left: Right Pain - part of body: Knee     Time: 1354-1410 PT Time Calculation (min) (ACUTE ONLY): 16 min  Charges:  $Gait Training: 8-22 mins $Therapeutic Exercise: 8-22 mins                     Mary Pacheco, PT, DPT, GCS [phone]    Mary Pacheco 09/04/2022, 2:22 PM

## 2022-09-04 NOTE — Progress Notes (Signed)
  Subjective: 1 Day Post-Op Procedure(s) (LRB): COMPUTER ASSISTED TOTAL KNEE ARTHROPLASTY (Right) Patient reports pain as well-controlled.  Husband at bedside.  Patient is well, and has had no acute complaints or problems Plan is to go Home after hospital stay. Negative for chest pain and shortness of breath Fever: no Gastrointestinal: negative for nausea and vomiting.   Patient has had a bowel movement.  Objective: Vital signs in last 24 hours: Temp:  [97 F (36.1 C)-98.5 F (36.9 C)] 97.5 F (36.4 C) (11/14 0700) Pulse Rate:  [61-85] 67 (11/14 0753) Resp:  [12-19] 16 (11/14 0753) BP: (126-188)/(41-65) 127/41 (11/14 0753) SpO2:  [97 %-100 %] 100 % (11/14 0753) Weight:  [61.7 kg] 61.7 kg (11/13 1011)  Intake/Output from previous day:  Intake/Output Summary (Last 24 hours) at 09/04/2022 0840 Last data filed at 09/04/2022 0751 Gross per 24 hour  Intake 2700.88 ml  Output 750 ml  Net 1950.88 ml    Intake/Output this shift: Total I/O In: 1200.9 [I.V.:1200.9] Out: -   Labs: No results for input(s): "HGB" in the last 72 hours. No results for input(s): "WBC", "RBC", "HCT", "PLT" in the last 72 hours. No results for input(s): "NA", "K", "CL", "CO2", "BUN", "CREATININE", "GLUCOSE", "CALCIUM" in the last 72 hours. No results for input(s): "LABPT", "INR" in the last 72 hours.   EXAM General - Patient is Alert, Appropriate, and Oriented Extremity - Neurovascular intact Dorsiflexion/Plantar flexion intact Compartment soft Dressing/Incision -Postoperative dressing remains in place., Polar Care in place and working. , Hemovac in place. , Following removal of post-op dressing, mild bloody drainage noted  Motor Function - intact, moving foot and toes well on exam. Able to perform independent SLR.  Cardiovascular- Regular rate and rhythm, no murmurs/rubs/gallops Respiratory- Lungs clear to auscultation bilaterally Gastrointestinal- soft, nontender, and active bowel  sounds   Assessment/Plan: 1 Day Post-Op Procedure(s) (LRB): COMPUTER ASSISTED TOTAL KNEE ARTHROPLASTY (Right) Principal Problem:   Total knee replacement status  Estimated body mass index is 24.1 kg/m as calculated from the following:   Height as of this encounter: '5\' 3"'$  (1.6 m).   Weight as of this encounter: 61.7 kg. Advance diet Up with therapy  Anticipate d/c later this PM pending completion of therapy goals.   Post-op dressing removed. , Hemovac removed., Mini compression dressing applied. , and Fresh honeycomb dressing applied.   DVT Prophylaxis - Lovenox, Ted hose, and SCDs Weight-Bearing as tolerated to right leg  Cassell Smiles, PA-C [facility] 09/04/2022, 8:40 AM

## 2022-09-04 NOTE — Evaluation (Signed)
Occupational Therapy Evaluation Patient Details Name: Mary Pacheco MRN: [id number] DOB: 02/20/1941 Today's Date: 09/04/2022   History of Present Illness Pt admitted for R TKR. HIstory includes anxiety, HLD, HTN, and GERD. History of L TKR 3 years ago.   Clinical Impression   Pt seen for OT evaluation this date, POD#1 from above surgery. Pt was independent in all ADL prior to surgery and is eager to return to PLOF with less pain and improved safety and independence. Pt currently requires PRN minimal assist for LB dressing and bathing while in seated position due to pain and limited AROM of R knee. Pt/spouse instructed in polar care mgt, falls prevention strategies, home/routines modifications, DME/AE for LB bathing and dressing tasks, and compression stocking mgt. Handout provided to support recall and carryover. Pt/spouse verbalized understanding and endorsed some fair recall from previous TKA ~51yr ago. Do not currently anticipate any OT needs following this hospitalization.  Will sign off.      Recommendations for follow up therapy are one component of a multi-disciplinary discharge planning process, led by the attending physician.  Recommendations may be updated based on patient status, additional functional criteria and insurance authorization.   Follow Up Recommendations  No OT follow up     Assistance Recommended at Discharge PRN  Patient can return home with the following A little help with walking and/or transfers;A little help with bathing/dressing/bathroom;Assistance with cooking/housework;Assist for transportation;Help with stairs or ramp for entrance    Functional Status Assessment  Patient has had a recent decline in their functional status and demonstrates the ability to make significant improvements in function in a reasonable and predictable amount of time.  Equipment Recommendations  None recommended by OT    Recommendations for Other Services       Precautions /  Restrictions Precautions Precautions: Fall;Knee Precaution Booklet Issued: Yes (comment) Restrictions Weight Bearing Restrictions: Yes RLE Weight Bearing: Weight bearing as tolerated      Mobility Bed Mobility               General bed mobility comments: NT, received in recliner pre/post session    Transfers Overall transfer level: Needs assistance Equipment used: Rolling walker (2 wheels) Transfers: Sit to/from Stand Sit to Stand: Supervision                  Balance Overall balance assessment: Needs assistance Sitting-balance support: Feet supported Sitting balance-Leahy Scale: Good     Standing balance support: Bilateral upper extremity supported Standing balance-Leahy Scale: Good                             ADL either performed or assessed with clinical judgement   ADL Overall ADL's : Needs assistance/impaired                                       General ADL Comments: Pt currently requires PRN MIN A for LB ADL tasks, supv for ADL transfers with RW, and MOD A for compression stocking mgt. Pt's spouse able to assist as needed.     Vision         Perception     Praxis      Pertinent Vitals/Pain Pain Assessment Pain Assessment: No/denies pain     Hand Dominance     Extremity/Trunk Assessment Upper Extremity Assessment Upper Extremity Assessment: Overall WFL for tasks assessed  Lower Extremity Assessment Lower Extremity Assessment: Generalized weakness;Defer to PT evaluation       Communication Communication Communication: No difficulties   Cognition Arousal/Alertness: Awake/alert Behavior During Therapy: WFL for tasks assessed/performed Overall Cognitive Status: Within Functional Limits for tasks assessed                                       General Comments       Exercises Other Exercises Other Exercises: Pt/spouse educated in polar care mgt, falls prevention, compression stocking  mgt, AE/DME, and home/routines modifications to maximize safety/indep with ADL/mobility during recovery. Handout provided.   Shoulder Instructions      Home Living Family/patient expects to be discharged to:: Private residence Living Arrangements: Spouse/significant other Available Help at Discharge: Family;Available PRN/intermittently (sister is coming from [place] to assist. Pt has friends to bring meals) Type of Home: House Home Access: Stairs to enter [employer] of Steps: 2 Entrance Stairs-Rails: Left Home Layout: One level     Bathroom Shower/Tub: Walk-in shower         Home Equipment: Conservation officer, nature (2 wheels);Shower seat - built in          Prior Functioning/Environment Prior Level of Function : Independent/Modified Independent;Driving             Mobility Comments: reports she was limping PTA. No AD used ADLs Comments: indep with all ADLs        OT Problem List: Decreased strength;Decreased range of motion      OT Treatment/Interventions:      OT Goals(Current goals can be found in the care plan section) Acute Rehab OT Goals Patient Stated Goal: get better OT Goal Formulation: All assessment and education complete, DC therapy  OT Frequency:      Co-evaluation              AM-PAC OT "6 Clicks" Daily Activity     Outcome Measure Help from another person eating meals?: None Help from another person taking care of personal grooming?: None Help from another person toileting, which includes using toliet, bedpan, or urinal?: None Help from another person bathing (including washing, rinsing, drying)?: A Little Help from another person to put on and taking off regular upper body clothing?: None Help from another person to put on and taking off regular lower body clothing?: A Little 6 Click Score: 22   End of Session    Activity Tolerance: Patient tolerated treatment well Patient left: in chair;with call bell/phone within reach;with chair  alarm set;with family/visitor present  OT Visit Diagnosis: Other abnormalities of gait and mobility (R26.89)                Time: 2549-8264 OT Time Calculation (min): 18 min Charges:  OT General Charges $OT Visit: 1 Visit OT Evaluation $OT Eval Low Complexity: 1 Low OT Treatments $Self Care/Home Management : 8-22 mins  Ardeth Perfect., MPH, MS, OTR/L ascom [phone] 09/04/22, 11:15 AM

## 2022-09-04 NOTE — Evaluation (Signed)
Physical Therapy Evaluation Patient Details Name: Mary Pacheco MRN: [id number] DOB: 30-May-1941 Today's Date: 09/04/2022  History of Present Illness  Pt admitted for R TKR. HIstory includes anxiety, HLD, HTN, and GERD. History of L TKR 3 years ago.  Clinical Impression  Pt is a pleasant 81 year old female who was admitted for R TKR. Pt up to recliner with RN staff. Pt performs transfers with supervision and ambulation with cga and RW. Pt demonstrates ability to perform 10 SLRs with independence, therefore does not require KI for mobility. Pt demonstrates deficits with strength/ROM/mobility. Would benefit from skilled PT to address above deficits and promote optimal return to PLOF. Recommend transition to [facility] upon discharge from acute hospitalization. Anticipate one more session prior to discharge for stair completion. Secure chat sent to team.      Recommendations for follow up therapy are one component of a multi-disciplinary discharge planning process, led by the attending physician.  Recommendations may be updated based on patient status, additional functional criteria and insurance authorization.  Follow Up Recommendations Home health PT      Assistance Recommended at Discharge Set up Supervision/Assistance  Patient can return home with the following  A little help with walking and/or transfers;Assist for transportation;Help with stairs or ramp for entrance    Equipment Recommendations None recommended by PT  Recommendations for Other Services       Functional Status Assessment Patient has had a recent decline in their functional status and demonstrates the ability to make significant improvements in function in a reasonable and predictable amount of time.     Precautions / Restrictions Precautions Precautions: Fall;Knee Precaution Booklet Issued: Yes (comment) Restrictions Weight Bearing Restrictions: Yes RLE Weight Bearing: Weight bearing as tolerated      Mobility   Bed Mobility               General bed mobility comments: NT, received in recliner pre/post session    Transfers Overall transfer level: Needs assistance Equipment used: Rolling walker (2 wheels) Transfers: Sit to/from Stand Sit to Stand: Supervision           General transfer comment: stands prior to commands. RW adjusted to pt height. Upright posture with good weight acceptance    Ambulation/Gait Ambulation/Gait assistance: Min guard Gait Distance (Feet): 200 Feet Assistive device: Rolling walker (2 wheels) Gait Pattern/deviations: Step-through pattern       General Gait Details: ambulated around RN station wtih reciprocal gait pattern. Safe technique with ability to carry conversation with exertion.  Stairs            Wheelchair Mobility    Modified Rankin (Stroke Patients Only)       Balance Overall balance assessment: Needs assistance Sitting-balance support: Feet supported Sitting balance-Leahy Scale: Good     Standing balance support: Bilateral upper extremity supported Standing balance-Leahy Scale: Good                               Pertinent Vitals/Pain Pain Assessment Pain Assessment: Faces Faces Pain Scale: Hurts little more Pain Location: R knee Pain Descriptors / Indicators: Operative site guarding Pain Intervention(s): Limited activity within patient's tolerance, Ice applied, Repositioned, Premedicated before session    Home Living Family/patient expects to be discharged to:: Private residence Living Arrangements: Spouse/significant other Available Help at Discharge: Family;Available PRN/intermittently (sister is coming from [place] to assist. Pt has friends to bring meals) Type of Home: House Home Access: Stairs to  enter Entrance Stairs-Rails: Left Entrance Stairs-Number of Steps: 2   Home Layout: One level Home Equipment: Conservation officer, nature (2 wheels)      Prior Function Prior Level of Function : Independent/Modified  Independent;Driving             Mobility Comments: reports she was limping PTA. No AD used ADLs Comments: indep with all ADLs     Hand Dominance        Extremity/Trunk Assessment   Upper Extremity Assessment Upper Extremity Assessment: Overall WFL for tasks assessed    Lower Extremity Assessment Lower Extremity Assessment: Generalized weakness (R LE grossly 3/5; L LE groslsy 4/5)       Communication   Communication: No difficulties  Cognition Arousal/Alertness: Awake/alert Behavior During Therapy: WFL for tasks assessed/performed Overall Cognitive Status: Within Functional Limits for tasks assessed                                          General Comments      Exercises Total Joint Exercises Goniometric ROM: R knee 0-78 degrees AAROM Other Exercises Other Exercises: seated ther-ex performed on R LE including AP, quad sets, SLRs, hip abd/add, and SAQ. Written HEP given and reviewed   Assessment/Plan    PT Assessment Patient needs continued PT services  PT Problem List Decreased strength;Decreased range of motion;Decreased mobility;Pain       PT Treatment Interventions DME instruction;Gait training;Stair training;Therapeutic exercise    PT Goals (Current goals can be found in the Care Plan section)  Acute Rehab PT Goals Patient Stated Goal: to go home PT Goal Formulation: With patient Time For Goal Achievement: 09/18/22 Potential to Achieve Goals: Good    Frequency BID     Co-evaluation               AM-PAC PT "6 Clicks" Mobility  Outcome Measure Help needed turning from your back to your side while in a flat bed without using bedrails?: A Little Help needed moving from lying on your back to sitting on the side of a flat bed without using bedrails?: A Little Help needed moving to and from a bed to a chair (including a wheelchair)?: A Little Help needed standing up from a chair using your arms (e.g., wheelchair or bedside  chair)?: A Little Help needed to walk in hospital room?: A Little Help needed climbing 3-5 steps with a railing? : A Little 6 Click Score: 18    End of Session Equipment Utilized During Treatment: Gait belt Activity Tolerance: Patient tolerated treatment well Patient left: in chair;with chair alarm set Nurse Communication: Mobility status PT Visit Diagnosis: Muscle weakness (generalized) (M62.81);Difficulty in walking, not elsewhere classified (R26.2);Pain Pain - Right/Left: Right Pain - part of body: Knee    Time: 0827-0853 PT Time Calculation (min) (ACUTE ONLY): 26 min   Charges:   PT Evaluation $PT Eval Low Complexity: 1 Low PT Treatments $Therapeutic Exercise: 8-22 mins        Greggory Stallion, PT, DPT, GCS [phone]   Sharman Garrott 09/04/2022, 10:44 AM

## 2022-09-04 NOTE — Progress Notes (Signed)
Patient discharging home. Per Cassell Smiles PA, patient given 2 fresh honeycomb dressings,a dn she is set up to do physical therapy here at [facility] when she comes for her two week post-op visit. Patient aware. Husband at bedside transporting home.

## 2022-09-11 ENCOUNTER — Other Ambulatory Visit: Payer: Self-pay | Admitting: Family

## 2022-09-11 DIAGNOSIS — I10 Essential (primary) hypertension: Secondary | ICD-10-CM

## 2022-09-20 ENCOUNTER — Other Ambulatory Visit: Payer: Self-pay | Admitting: Family

## 2022-10-30 ENCOUNTER — Ambulatory Visit (INDEPENDENT_AMBULATORY_CARE_PROVIDER_SITE_OTHER): Payer: Medicare Other | Admitting: Nurse Practitioner

## 2022-10-30 ENCOUNTER — Encounter (INDEPENDENT_AMBULATORY_CARE_PROVIDER_SITE_OTHER): Payer: Medicare Other

## 2022-11-01 ENCOUNTER — Other Ambulatory Visit (INDEPENDENT_AMBULATORY_CARE_PROVIDER_SITE_OTHER): Payer: Self-pay | Admitting: Vascular Surgery

## 2022-11-01 DIAGNOSIS — I6523 Occlusion and stenosis of bilateral carotid arteries: Secondary | ICD-10-CM

## 2022-11-08 ENCOUNTER — Encounter (INDEPENDENT_AMBULATORY_CARE_PROVIDER_SITE_OTHER): Payer: Medicare Other

## 2022-11-08 ENCOUNTER — Ambulatory Visit (INDEPENDENT_AMBULATORY_CARE_PROVIDER_SITE_OTHER): Payer: Medicare Other | Admitting: Nurse Practitioner

## 2022-11-19 ENCOUNTER — Telehealth: Payer: Self-pay | Admitting: Family

## 2022-11-19 DIAGNOSIS — K219 Gastro-esophageal reflux disease without esophagitis: Secondary | ICD-10-CM

## 2022-11-19 NOTE — Telephone Encounter (Signed)
Pt need a refill on SYNTHROID and pantoprazole sent to [facility]

## 2022-11-20 ENCOUNTER — Other Ambulatory Visit: Payer: Self-pay | Admitting: Family

## 2022-11-20 DIAGNOSIS — E039 Hypothyroidism, unspecified: Secondary | ICD-10-CM

## 2022-11-20 MED ORDER — PANTOPRAZOLE SODIUM 40 MG PO TBEC: 40.0000 mg | DELAYED_RELEASE_TABLET | Freq: Every day | ORAL | 1 refills | Status: AC | PRN

## 2022-11-20 MED ORDER — LEVOTHYROXINE SODIUM 50 MCG PO TABS: 50.0000 ug | ORAL_TABLET | Freq: Every day | ORAL | 3 refills | Status: DC

## 2022-11-21 ENCOUNTER — Other Ambulatory Visit: Payer: Self-pay

## 2022-11-21 DIAGNOSIS — E039 Hypothyroidism, unspecified: Secondary | ICD-10-CM

## 2022-11-21 NOTE — Telephone Encounter (Signed)
Pt sched Order placed

## 2022-11-27 ENCOUNTER — Other Ambulatory Visit: Payer: Medicare Other

## 2022-11-29 ENCOUNTER — Other Ambulatory Visit (INDEPENDENT_AMBULATORY_CARE_PROVIDER_SITE_OTHER): Payer: Medicare Other

## 2022-11-29 ENCOUNTER — Other Ambulatory Visit: Payer: Medicare Other

## 2022-11-29 DIAGNOSIS — E039 Hypothyroidism, unspecified: Secondary | ICD-10-CM

## 2022-11-29 LAB — TSH: TSH: 0.43 u[IU]/mL (ref 0.35–5.50)

## 2022-11-30 ENCOUNTER — Other Ambulatory Visit: Payer: Medicare Other

## 2022-12-04 ENCOUNTER — Ambulatory Visit: Payer: Medicare Other | Admitting: Family

## 2022-12-13 ENCOUNTER — Telehealth: Payer: Self-pay | Admitting: Family

## 2022-12-13 NOTE — Telephone Encounter (Signed)
Contacted Mary Pacheco to schedule their annual wellness visit. Appointment made for 12/21/2022.  Thank you,  [place] Direct dial  [phone]

## 2022-12-14 ENCOUNTER — Other Ambulatory Visit: Payer: Self-pay | Admitting: Family

## 2022-12-21 ENCOUNTER — Telehealth: Payer: Self-pay

## 2022-12-21 NOTE — Telephone Encounter (Signed)
Unavailable when called numbers in the chart for scheduled AWV. No answer. Left msg. Okay to reschedule.

## 2022-12-27 ENCOUNTER — Telehealth: Payer: Self-pay | Admitting: Family

## 2022-12-27 NOTE — Telephone Encounter (Signed)
Copied from [place] [id number]. Topic: Medicare AWV >> Dec 27, 2022  3:05 PM Lollie Marrow wrote: Reason for CRM: Called patient to schedule Medicare Annual Wellness Visit (AWV). Left message for patient to call back and schedule Medicare Annual Wellness Visit (AWV).  Last date of AWV: 12/14/2021  Please schedule an appointment at any time with Denisa, [place].  If any questions, please contact me at [phone].    Thank you,  [place] Direct dial  [phone]

## 2023-03-02 ENCOUNTER — Other Ambulatory Visit: Payer: Self-pay | Admitting: Family

## 2023-03-12 ENCOUNTER — Telehealth: Payer: Self-pay | Admitting: Family

## 2023-03-12 NOTE — Telephone Encounter (Signed)
Copied from CRM [id number]. Topic: Medicare AWV >> Mar 12, 2023  1:23 PM Payton Doughty wrote: Reason for CRM: LVM 03/12/2023 TO SCHED AWV  Verlee Rossetti; Care Guide Ambulatory Clinical Support [place] [facility] Direct Dial: [phone]

## 2023-07-01 ENCOUNTER — Telehealth: Payer: Self-pay | Admitting: Family

## 2023-07-01 NOTE — Telephone Encounter (Signed)
Copied from CRM [id number]. Topic: Medicare AWV >> Jul 01, 2023 11:18 AM Payton Doughty wrote: Reason for CRM: LM 07/01/2023 to schedule AWV   Verlee Rossetti; Care Guide Ambulatory Clinical Support [place] [facility] Direct Dial: [phone]

## 2023-11-05 ENCOUNTER — Other Ambulatory Visit: Payer: Self-pay | Admitting: Family

## 2023-11-05 DIAGNOSIS — E039 Hypothyroidism, unspecified: Secondary | ICD-10-CM

## 2024-02-03 ENCOUNTER — Telehealth: Payer: Self-pay

## 2024-02-03 NOTE — Telephone Encounter (Signed)
 Tried to contact get scheduled as she has not been seen since 2023 and is on the A1c compliance list. Mailbox is full was unable to leave vm to CB

## 2024-07-29 ENCOUNTER — Other Ambulatory Visit: Payer: Self-pay | Admitting: Family

## 2024-07-29 DIAGNOSIS — E039 Hypothyroidism, unspecified: Secondary | ICD-10-CM

## 2024-09-09 NOTE — Telephone Encounter (Signed)
 open in error
